# Patient Record
Sex: Female | Born: 1970 | Race: Black or African American | Hispanic: No | Marital: Single | Smoking: Never smoker
Health system: Southern US, Community
[De-identification: ages and names within clinical notes are randomized; demographics above are authoritative.]

## PROBLEM LIST (undated history)

## (undated) DIAGNOSIS — K219 Gastro-esophageal reflux disease without esophagitis: Secondary | ICD-10-CM

## (undated) DIAGNOSIS — J302 Other seasonal allergic rhinitis: Secondary | ICD-10-CM

## (undated) DIAGNOSIS — D649 Anemia, unspecified: Secondary | ICD-10-CM

## (undated) DIAGNOSIS — T7840XA Allergy, unspecified, initial encounter: Secondary | ICD-10-CM

## (undated) DIAGNOSIS — J45909 Unspecified asthma, uncomplicated: Secondary | ICD-10-CM

## (undated) DIAGNOSIS — Z5189 Encounter for other specified aftercare: Secondary | ICD-10-CM

## (undated) HISTORY — PX: WISDOM TOOTH EXTRACTION: SHX21

## (undated) HISTORY — DX: Unspecified asthma, uncomplicated: J45.909

## (undated) HISTORY — DX: Allergy, unspecified, initial encounter: T78.40XA

## (undated) HISTORY — DX: Gastro-esophageal reflux disease without esophagitis: K21.9

## (undated) HISTORY — DX: Other seasonal allergic rhinitis: J30.2

## (undated) HISTORY — DX: Anemia, unspecified: D64.9

## (undated) HISTORY — DX: Encounter for other specified aftercare: Z51.89

---

## 2004-07-30 ENCOUNTER — Ambulatory Visit (HOSPITAL_COMMUNITY): Admission: RE | Admit: 2004-07-30 | Discharge: 2004-07-30 | Payer: Self-pay | Admitting: *Deleted

## 2004-07-30 ENCOUNTER — Ambulatory Visit (HOSPITAL_BASED_OUTPATIENT_CLINIC_OR_DEPARTMENT_OTHER): Admission: RE | Admit: 2004-07-30 | Discharge: 2004-07-30 | Payer: Self-pay | Admitting: *Deleted

## 2008-10-27 ENCOUNTER — Ambulatory Visit: Payer: Self-pay

## 2008-10-28 ENCOUNTER — Ambulatory Visit: Payer: Self-pay

## 2008-11-14 HISTORY — PX: NASAL POLYP SURGERY: SHX186

## 2010-01-29 ENCOUNTER — Emergency Department: Payer: Self-pay | Admitting: Emergency Medicine

## 2013-11-14 HISTORY — PX: GASTRIC BYPASS: SHX52

## 2013-12-24 ENCOUNTER — Ambulatory Visit: Payer: Self-pay | Admitting: Bariatrics

## 2013-12-24 LAB — COMPREHENSIVE METABOLIC PANEL
Albumin: 3.4 g/dL (ref 3.4–5.0)
Alkaline Phosphatase: 76 U/L
Anion Gap: 8 (ref 7–16)
BILIRUBIN TOTAL: 0.5 mg/dL (ref 0.2–1.0)
BUN: 8 mg/dL (ref 7–18)
CHLORIDE: 103 mmol/L (ref 98–107)
CREATININE: 1.06 mg/dL (ref 0.60–1.30)
Calcium, Total: 9 mg/dL (ref 8.5–10.1)
Co2: 26 mmol/L (ref 21–32)
EGFR (African American): >60
EGFR (Non-African Amer.): >60
Glucose: 116 mg/dL — ABNORMAL HIGH (ref 65–99)
OSMOLALITY: 273 (ref 275–301)
Potassium: 3.8 mmol/L (ref 3.5–5.1)
SGOT(AST): 17 U/L (ref 15–37)
SGPT (ALT): 25 U/L (ref 12–78)
SODIUM: 137 mmol/L (ref 136–145)
TOTAL PROTEIN: 7.4 g/dL (ref 6.4–8.2)

## 2013-12-24 LAB — CBC WITH DIFFERENTIAL/PLATELET
Basophil #: 0.1 10*3/uL (ref 0.0–0.1)
Basophil %: 0.9 %
Eosinophil #: 0.3 10*3/uL (ref 0.0–0.7)
Eosinophil %: 4.6 %
HCT: 28.8 % — AB (ref 35.0–47.0)
HGB: 8.6 g/dL — AB (ref 12.0–16.0)
LYMPHS ABS: 1.4 10*3/uL (ref 1.0–3.6)
LYMPHS PCT: 25.6 %
MCH: 18.5 pg — AB (ref 26.0–34.0)
MCHC: 30 g/dL — ABNORMAL LOW (ref 32.0–36.0)
MCV: 62 fL — ABNORMAL LOW (ref 80–100)
MONO ABS: 0.5 x10 3/mm (ref 0.2–0.9)
Monocyte %: 8.1 %
NEUTROS ABS: 3.4 10*3/uL (ref 1.4–6.5)
Neutrophil %: 60.8 %
Platelet: 210 10*3/uL (ref 150–440)
RBC: 4.67 10*6/uL (ref 3.80–5.20)
RDW: 18.3 % — ABNORMAL HIGH (ref 11.5–14.5)
WBC: 5.6 10*3/uL (ref 3.6–11.0)

## 2013-12-24 LAB — IRON AND TIBC
IRON SATURATION: 5 %
Iron Bind.Cap.(Total): 500 ug/dL — ABNORMAL HIGH (ref 250–450)
Iron: 27 ug/dL — ABNORMAL LOW (ref 50–170)
Unbound Iron-Bind.Cap.: 473 ug/dL

## 2013-12-24 LAB — AMYLASE: AMYLASE: 49 U/L (ref 25–115)

## 2013-12-24 LAB — FERRITIN: FERRITIN (ARMC): 4 ng/mL — AB (ref 8–388)

## 2013-12-24 LAB — FOLATE: Folic Acid: 17.8 ng/mL (ref 3.1–100.0)

## 2013-12-24 LAB — LIPASE, BLOOD: LIPASE: 92 U/L (ref 73–393)

## 2013-12-24 LAB — APTT: Activated PTT: 27.7 secs (ref 23.6–35.9)

## 2013-12-24 LAB — TSH: THYROID STIMULATING HORM: 1.39 u[IU]/mL

## 2013-12-24 LAB — MAGNESIUM: MAGNESIUM: 1.9 mg/dL

## 2013-12-24 LAB — HEMOGLOBIN A1C: HEMOGLOBIN A1C: 6 % (ref 4.2–6.3)

## 2013-12-24 LAB — PROTIME-INR
INR: 1
Prothrombin Time: 13.5 secs (ref 11.5–14.7)

## 2013-12-24 LAB — PHOSPHORUS: Phosphorus: 3.5 mg/dL (ref 2.5–4.9)

## 2013-12-24 LAB — BILIRUBIN, DIRECT: Bilirubin, Direct: 0.1 mg/dL (ref 0.00–0.20)

## 2014-01-27 ENCOUNTER — Ambulatory Visit: Payer: Self-pay | Admitting: Bariatrics

## 2014-02-12 ENCOUNTER — Ambulatory Visit: Payer: Self-pay | Admitting: Bariatrics

## 2014-02-12 IMAGING — CR DG CHEST 2V
1 series · 2 of 2 positions shown · non-contrast
Comparison: None.

CLINICAL DATA: History of asthma, pre bariatric surgery evaluation

EXAM:
CHEST  2 VIEW

[Series 1: w chest pa · 0.14mm/px · 2 of 2 slices shown]
[im 1/2]
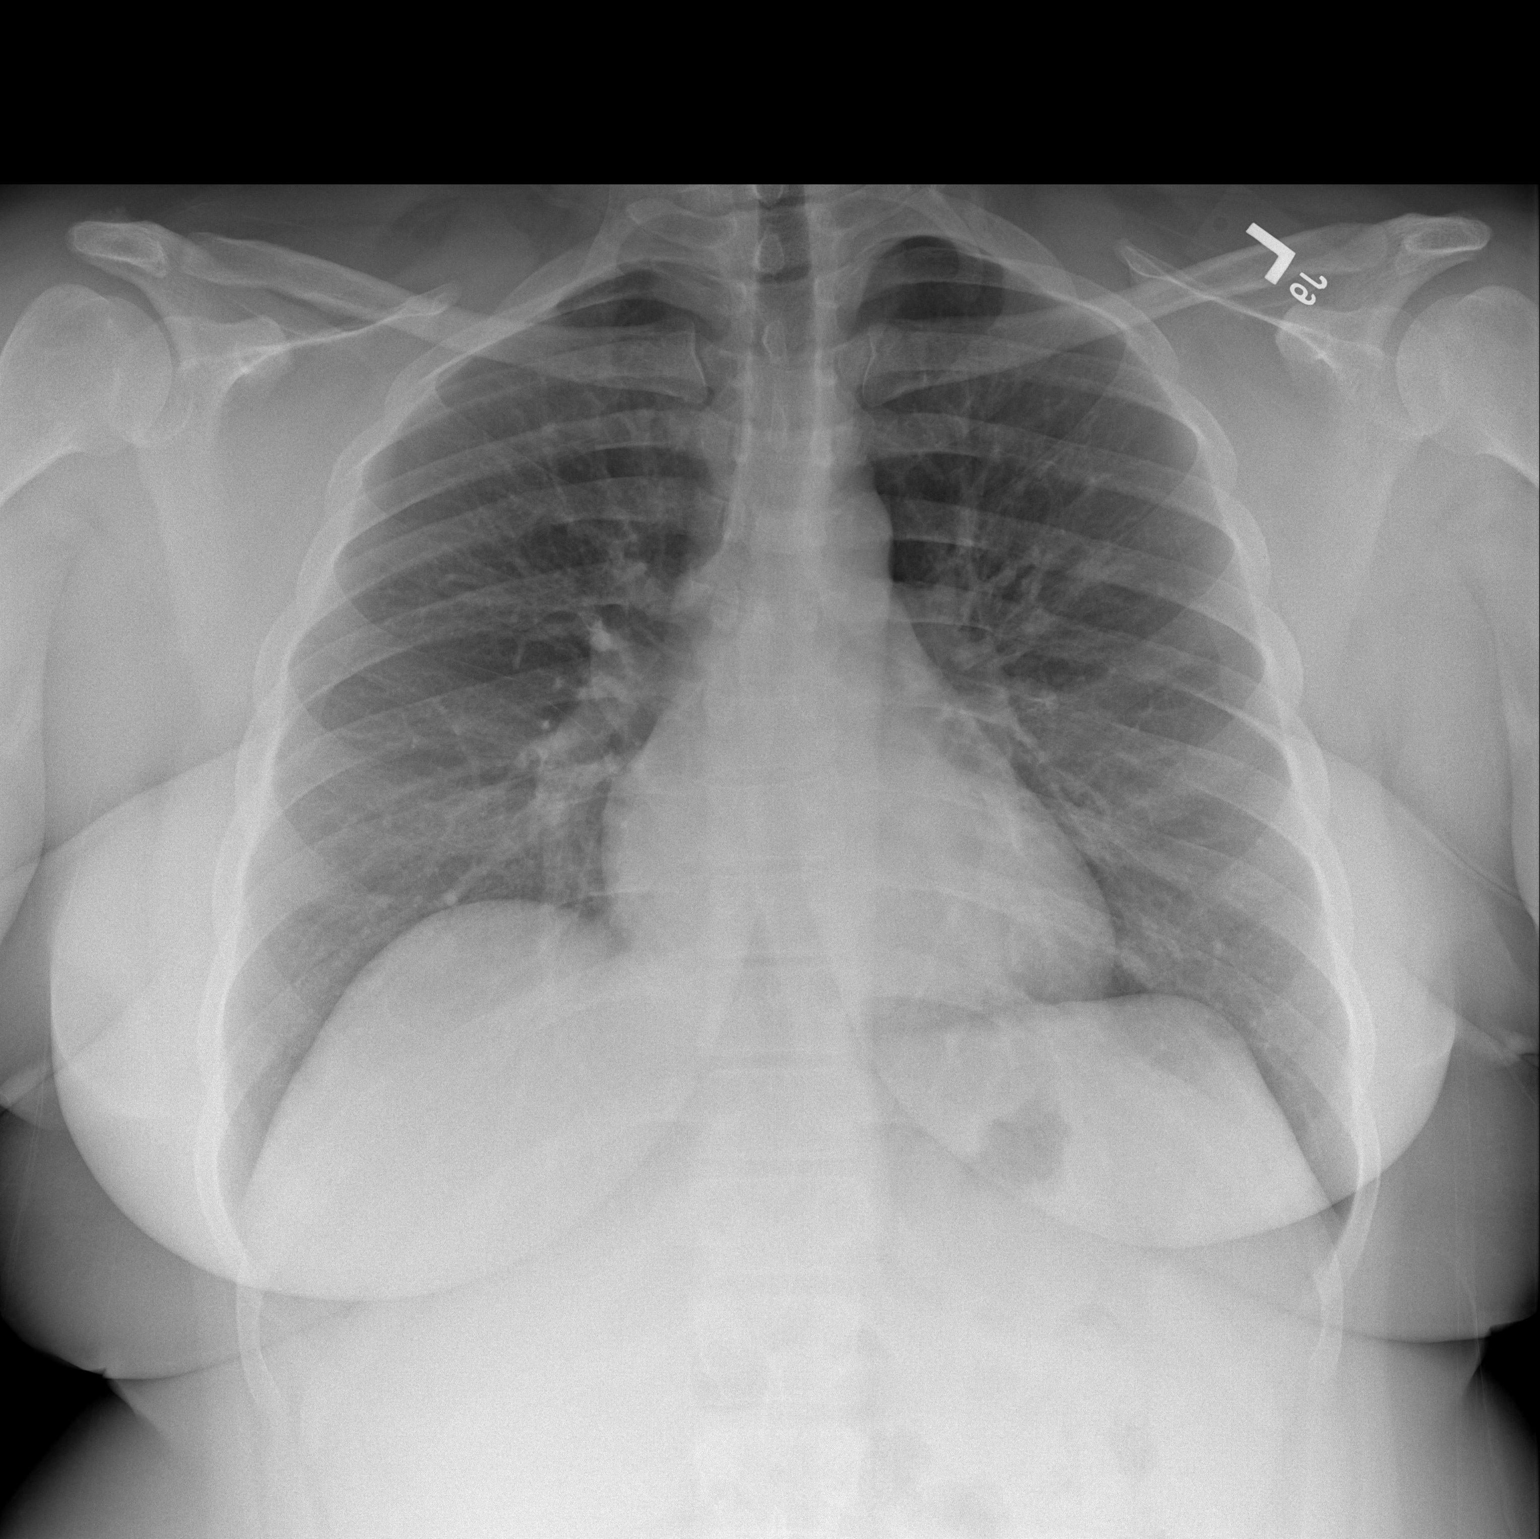
[im 2/2]
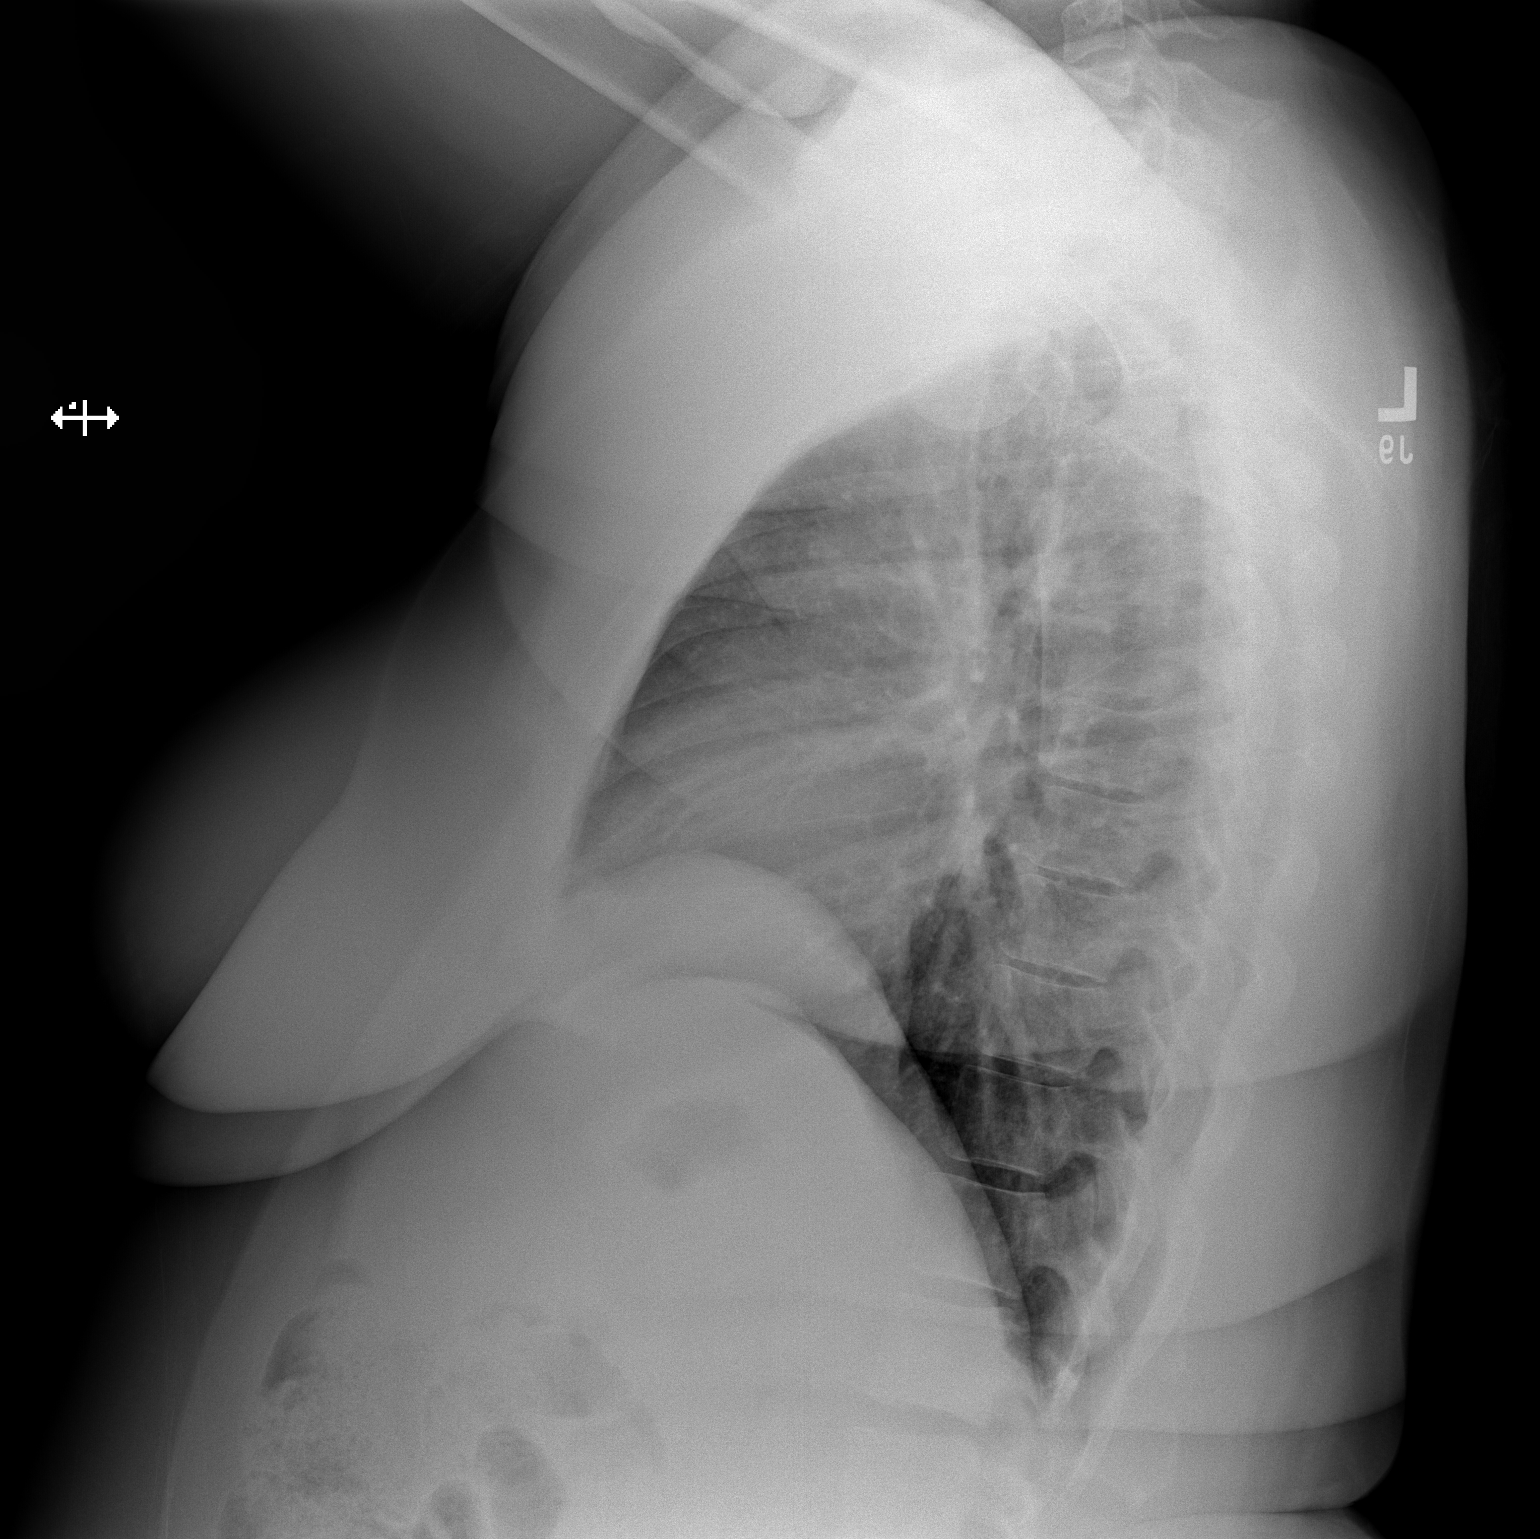

[2 of 2 positions shown; findings below may reference images not displayed]

FINDINGS: The heart size and mediastinal contours are within normal limits.
Both lungs are clear. The visualized skeletal structures are
unremarkable.
IMPRESSION: No active cardiopulmonary disease.

## 2017-01-24 ENCOUNTER — Telehealth: Payer: Self-pay

## 2017-01-24 DIAGNOSIS — F5101 Primary insomnia: Secondary | ICD-10-CM

## 2017-01-24 MED ORDER — TRAZODONE HCL 50 MG PO TABS: 100.0000 mg | ORAL_TABLET | Freq: Every evening | ORAL | 2 refills | Status: DC | PRN

## 2017-01-24 NOTE — Telephone Encounter (Signed)
Pt states she received a denial for her trazodone 50mg  from her her pharmacy and requests SDJ to send in. I have updated her pharmcy to [facility]. Pt cb# [phone] thank you

## 2017-01-24 NOTE — Telephone Encounter (Signed)
Please advise and let Meriam SpragueBeverly know. thanks

## 2017-01-25 NOTE — Telephone Encounter (Signed)
rx for trazodone sent to [facility] on 01/24/17 by SDJ.

## 2018-04-23 ENCOUNTER — Ambulatory Visit: Payer: Self-pay | Admitting: Obstetrics and Gynecology

## 2018-05-03 ENCOUNTER — Encounter: Payer: Self-pay | Admitting: Obstetrics and Gynecology

## 2018-05-03 ENCOUNTER — Ambulatory Visit (INDEPENDENT_AMBULATORY_CARE_PROVIDER_SITE_OTHER): Payer: 59 | Admitting: Obstetrics and Gynecology

## 2018-05-03 VITALS — BP 124/74 | HR 88 | Ht 66.0 in | Wt 255.0 lb

## 2018-05-03 DIAGNOSIS — Z1339 Encounter for screening examination for other mental health and behavioral disorders: Secondary | ICD-10-CM | POA: Diagnosis not present

## 2018-05-03 DIAGNOSIS — Z113 Encounter for screening for infections with a predominantly sexual mode of transmission: Secondary | ICD-10-CM | POA: Diagnosis not present

## 2018-05-03 DIAGNOSIS — Z01419 Encounter for gynecological examination (general) (routine) without abnormal findings: Secondary | ICD-10-CM | POA: Diagnosis not present

## 2018-05-03 DIAGNOSIS — Z0000 Encounter for general adult medical examination without abnormal findings: Secondary | ICD-10-CM | POA: Diagnosis not present

## 2018-05-03 DIAGNOSIS — Z1331 Encounter for screening for depression: Secondary | ICD-10-CM | POA: Diagnosis not present

## 2018-05-03 NOTE — Progress Notes (Signed)
Gynecology Annual Exam   PCP: Patient, No Pcp Per  Chief Complaint  Patient presents with  . Gynecologic Exam    History of Present Illness:  Ms. Frances Rodgers is a 47 y.o. No obstetric history on file. who LMP was Patient's last menstrual period was 04/16/2018 (exact date)., presents today for her annual examination.  Her menses are regular every 28-30 days, lasting 5 day(s).  Dysmenorrhea none. She does not have intermenstrual bleeding.  She does not have vasomotor symptoms.   She is single partner, contraception - IUD. She does not have vaginal dryness.  Last Pap: 11/2016  Results were: no abnormalities /neg HPV DNA.  Hx of STDs: none  Last mammogram: 09/2015 Results were: normal--routine follow-up in 12 months There is no FH of breast cancer. There is no FH of ovarian cancer. The patient does not do self-breast exams.  Colonoscopy: n/a DEXA: has not been screened for osteoporosis  Tobacco use: The patient denies current or previous tobacco use. Alcohol use: social drinker Exercise: very active (daily for past two months)  The patient wears seatbelts: yes.     The patient requests STD screening, blood tests included. She also requests a serum pregnancy test.   Past Medical History:  Diagnosis Date  . Asthma   . GERD (gastroesophageal reflux disease)   . Seasonal allergies     Past Surgical History:  Procedure Laterality Date  . GASTRIC BYPASS  2015  . NASAL POLYP SURGERY  2010  . WISDOM TOOTH EXTRACTION      Medications   Medication Sig Start Date End Date Taking? Authorizing Provider  albuterol (PROVENTIL HFA) 108 (90 Base) MCG/ACT inhaler Inhale into the lungs every 6 (six) hours as needed for wheezing or shortness of breath.   Yes [provider]  budesonide-formoterol (SYMBICORT) 80-4.5 MCG/ACT inhaler Inhale into the lungs.   Yes [provider]  Cetirizine HCl (ZYRTEC ALLERGY) 10 MG CAPS Take by mouth. 06/02/03  Yes [provider]  fluticasone (FLONASE) 50 MCG/ACT nasal spray 2 sprays by Each Nare route daily. 06/02/03  Yes [provider]  levonorgestrel (MIRENA) 20 MCG/24HR IUD 1 each by Intrauterine route once.   Yes [provider]  montelukast (SINGULAIR) 10 MG tablet Take by mouth.   Yes [provider]  omega-3 acid ethyl esters (LOVAZA) 1 g capsule Take by mouth 2 (two) times daily.   Yes [provider]  Omega-3 Fatty Acids (FISH OIL PO) Take by mouth.   Yes [provider]  omeprazole (PRILOSEC) 10 MG capsule Take 10 mg by mouth daily.   Yes [provider]  traZODone (DESYREL) 50 MG tablet Take 2 tablets (100 mg total) by mouth at bedtime as needed for sleep. 01/24/17  Yes Conard NovakJackson, Saul Fabiano D, MD    Allergies  Allergen Reactions  . Aspirin Anaphylaxis  . Ketorolac Anaphylaxis     Obstetric History: No obstetric history on file.  Family History  Problem Relation Age of Onset  . Diabetes Mother   . Diabetes Brother   . Pancreatic cancer Maternal Grandmother     Social History   Socioeconomic History  . Marital status: Single    Spouse name: Not on file  . Number of children: Not on file  . Years of education: Not on file  . Highest education level: Not on file  Occupational History  . Not on file  Social Needs  . Financial resource strain: Not on file  . Food  insecurity:    Worry: Not on file    Inability: Not on file  . Transportation needs:    Medical: Not on file    Non-medical: Not on file  Tobacco Use  . Smoking status: Never Smoker  . Smokeless tobacco: Current User  Substance and Sexual Activity  . Alcohol use: Never    Frequency: Never  . Drug use: Never  . Sexual activity: Yes    Birth control/protection: Condom  Lifestyle  . Physical activity:    Days per week: Not on file    Minutes per session: Not on file  . Stress: Not on file  Relationships  . Social connections:    Talks on phone: Not on file      Gets together: Not on file    Attends religious service: Not on file    Active member of club or organization: Not on file    Attends meetings of clubs or organizations: Not on file    Relationship status: Not on file  . Intimate partner violence:    Fear of current or ex partner: Not on file    Emotionally abused: Not on file    Physically abused: Not on file    Forced sexual activity: Not on file  Other Topics Concern  . Not on file  Social History Narrative  . Not on file    Review of Systems  Constitutional: Negative.   HENT: Negative.   Eyes: Negative.   Respiratory: Negative.   Cardiovascular: Negative.   Gastrointestinal: Negative.   Genitourinary: Negative.   Musculoskeletal: Negative.   Skin: Negative.   Neurological: Negative.   Psychiatric/Behavioral: Negative.      Physical Exam BP 124/74 (BP Location: Left Arm, Patient Position: Sitting, Cuff Size: Large)   Pulse 88   Ht 5\' 6"  (1.676 m)   Wt 255 lb (115.7 kg)   LMP 04/16/2018 (Exact Date)   SpO2 97%   BMI 41.16 kg/m   Physical Exam  Constitutional: She is oriented to person, place, and time. She appears well-developed and well-nourished. No distress.  Genitourinary: Uterus normal. Pelvic exam was performed with patient supine. There is no rash, tenderness, lesion or injury on the right labia. There is no rash, tenderness, lesion or injury on the left labia. No erythema, tenderness or bleeding in the vagina. No signs of injury around the vagina. No vaginal discharge found. Right adnexum does not display mass, does not display tenderness and does not display fullness. Left adnexum does not display mass, does not display tenderness and does not display fullness.  Cervix exhibits visible IUD strings. Cervix does not exhibit motion tenderness, lesion, discharge or polyp.   Uterus is mobile and anteverted. Uterus is not enlarged, tender or exhibiting a mass.  HENT:  Head: Normocephalic and atraumatic.  Eyes:  EOM are normal. No scleral icterus.  Neck: Normal range of motion. Neck supple. No thyromegaly present.  Cardiovascular: Normal rate and regular rhythm. Exam reveals no gallop and no friction rub.  No murmur heard. Pulmonary/Chest: Effort normal and breath sounds normal. No respiratory distress. She has no wheezes. She has no rales. Right breast exhibits no inverted nipple, no mass, no nipple discharge, no skin change and no tenderness. Left breast exhibits no inverted nipple, no mass, no nipple discharge, no skin change and no tenderness.  Abdominal: Soft. Bowel sounds are normal. She exhibits no distension and no mass. There is no tenderness. There is no rebound and no guarding.  Musculoskeletal: Normal range  of motion. She exhibits no edema or tenderness.  Lymphadenopathy:    She has no cervical adenopathy.       Right: No inguinal adenopathy present.       Left: No inguinal adenopathy present.  Neurological: She is alert and oriented to person, place, and time. No cranial nerve deficit.  Skin: Skin is warm and dry. No rash noted. No erythema.  Psychiatric: She has a normal mood and affect. Her behavior is normal. Judgment normal.    Female chaperone present for pelvic and breast  portions of the physical exam  Results: AUDIT Questionnaire (screen for alcoholism): 1 PHQ-9: 3  Assessment: 47 y.o. No obstetric history on file. female here for routine gynecologic examination.  Plan: Problem List Items Addressed This Visit    None    Visit Diagnoses    Women's annual routine gynecological examination    -  Primary   Screening for depression       Screening for alcoholism       Screen for STD (sexually transmitted disease)       Relevant Orders   RPR Qual   HIV antibody   Hepatitis B surface antibody   Hepatitis C antibody   Chlamydia/Gonococcus/Trichomonas, NAA   Beta hCG quant (ref lab)      Screening: -- Blood pressure screen normal -- Colonoscopy - not due -- Mammogram  - due. Patient to call [facility] to arrange. She understands that it is her responsibility to arrange this. -- Weight screening: obese: discussed management options, including lifestyle, dietary, and exercise. -- Depression screening negative (PHQ-9) -- Nutrition: normal -- cholesterol screening: not due for screening -- osteoporosis screening: not due -- tobacco screening: not using -- alcohol screening: AUDIT questionnaire indicates low-risk usage. -- family history of breast cancer screening: done. not at high risk. -- no evidence of domestic violence or intimate partner violence. -- STD screening: gonorrhea/chlamydia NAAT collected -- pap smear not collected per ASCCP guidelines -- HPV vaccination series: not eligilbe  Thomasene Mohair, MD 05/03/2018 11:38 AM

## 2018-05-04 LAB — RPR QUALITATIVE: RPR Ser Ql: NONREACTIVE

## 2018-05-04 LAB — BETA HCG QUANT (REF LAB): hCG Quant: <1 m[IU]/mL

## 2018-05-04 LAB — HEPATITIS C ANTIBODY

## 2018-05-04 LAB — HIV ANTIBODY (ROUTINE TESTING W REFLEX): HIV SCREEN 4TH GENERATION: NONREACTIVE

## 2018-05-04 LAB — HEPATITIS B SURFACE ANTIBODY,QUALITATIVE: HEP B SURFACE AB, QUAL: NONREACTIVE

## 2018-05-06 LAB — CHLAMYDIA/GONOCOCCUS/TRICHOMONAS, NAA
Chlamydia by NAA: NEGATIVE
GONOCOCCUS BY NAA: NEGATIVE
TRICH VAG BY NAA: NEGATIVE

## 2018-05-14 ENCOUNTER — Other Ambulatory Visit: Payer: Self-pay | Admitting: Physician Assistant

## 2018-05-14 DIAGNOSIS — R1312 Dysphagia, oropharyngeal phase: Secondary | ICD-10-CM

## 2018-05-18 ENCOUNTER — Ambulatory Visit
Admission: RE | Admit: 2018-05-18 | Discharge: 2018-05-18 | Disposition: A | Discharge status: Home / Self Care | Payer: 59 | Source: Ambulatory Visit | Attending: Physician Assistant | Admitting: Physician Assistant

## 2018-05-18 DIAGNOSIS — J029 Acute pharyngitis, unspecified: Secondary | ICD-10-CM | POA: Insufficient documentation

## 2018-05-18 DIAGNOSIS — R0982 Postnasal drip: Secondary | ICD-10-CM | POA: Insufficient documentation

## 2018-05-18 DIAGNOSIS — R1310 Dysphagia, unspecified: Secondary | ICD-10-CM | POA: Diagnosis not present

## 2018-05-18 DIAGNOSIS — J322 Chronic ethmoidal sinusitis: Secondary | ICD-10-CM | POA: Insufficient documentation

## 2018-05-18 DIAGNOSIS — J320 Chronic maxillary sinusitis: Secondary | ICD-10-CM | POA: Insufficient documentation

## 2018-05-18 DIAGNOSIS — R1312 Dysphagia, oropharyngeal phase: Secondary | ICD-10-CM

## 2018-05-18 DIAGNOSIS — R0602 Shortness of breath: Secondary | ICD-10-CM | POA: Diagnosis present

## 2018-05-18 MED ORDER — IOPAMIDOL (ISOVUE-300) INJECTION 61%
75.0000 mL | Freq: Once | INTRAVENOUS | Status: AC | PRN
  Administered 2018-05-18: 75 mL via INTRAVENOUS

## 2018-07-07 IMAGING — CT CT NECK W/ CM
4 of 6 series · 13 of 33 positions shown, 15 images · IV contrast (iopamidol)
Comparison: None.

CLINICAL DATA: Dysphagia, oropharyngeal. Intermittent neck pain for
3 months.

EXAM:
CT NECK WITH CONTRAST
TECHNIQUE: Multidetector CT imaging of the neck was performed using the
standard protocol following the bolus administration of intravenous
contrast.
CONTRAST:  75mL [id number] IOPAMIDOL ([id number]) INJECTION 61%

[Series 2: axial neck neck (person_name) · axial · 0.57mm/px · z∈[-772,-692]mm · 2 of 122 slices shown]
[im 41/122  bone]
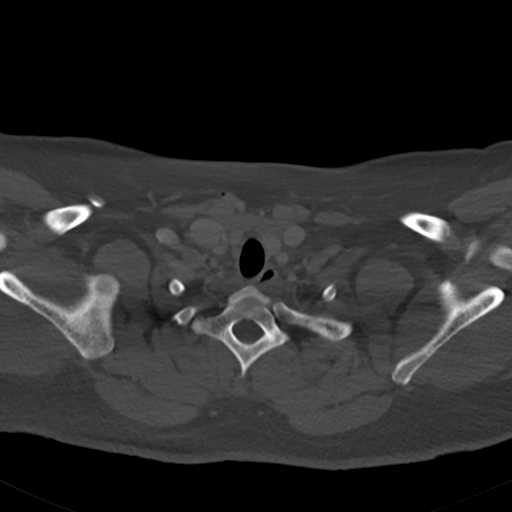
[im 81/122  bone]
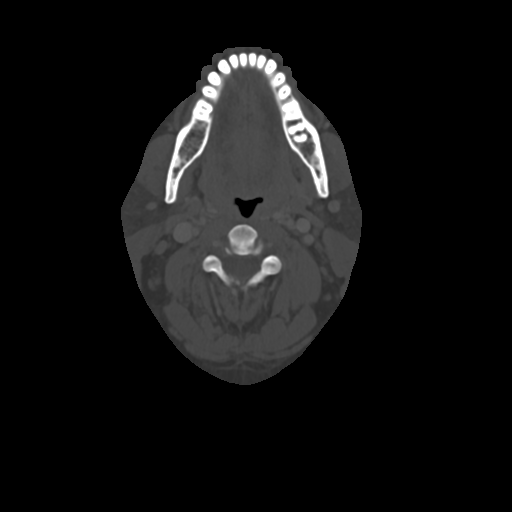

[Series 5: coronal neck neck (person_name) · coronal · 0.57mm/px · 3 of 120 slices shown]
[im 27/120  bone]
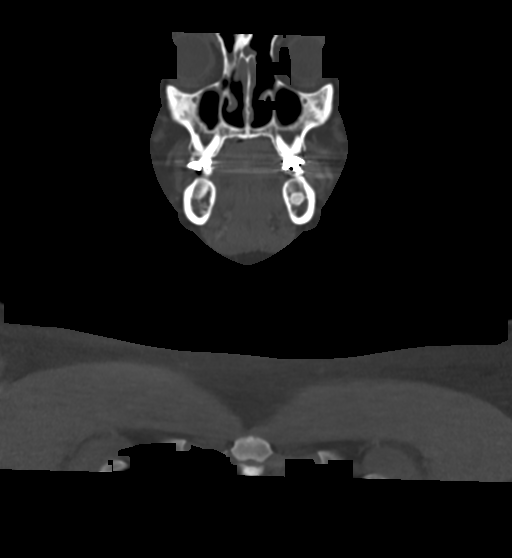
[im 49/120  bone]
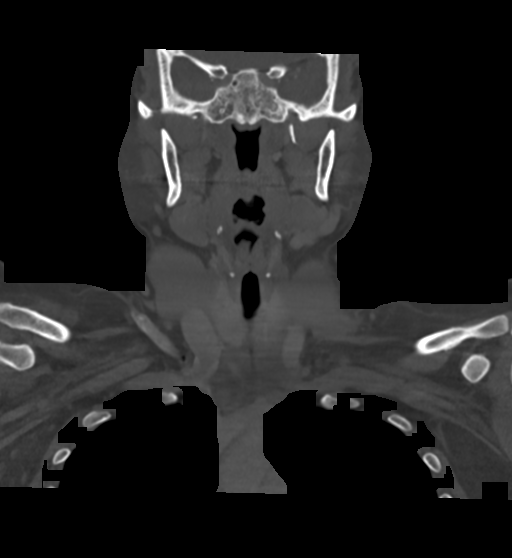
[im 71/120  bone]
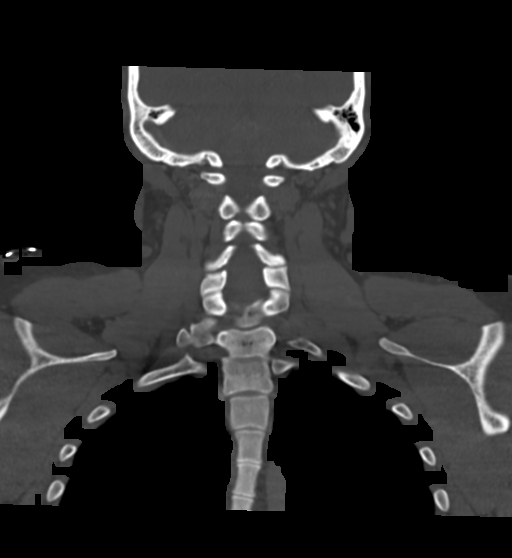

[Series 7: sagittal neck neck (person_name) · sagittal · 0.47mm/px · 5 of 146 slices shown, 6 images]
[im 49/146  bone]
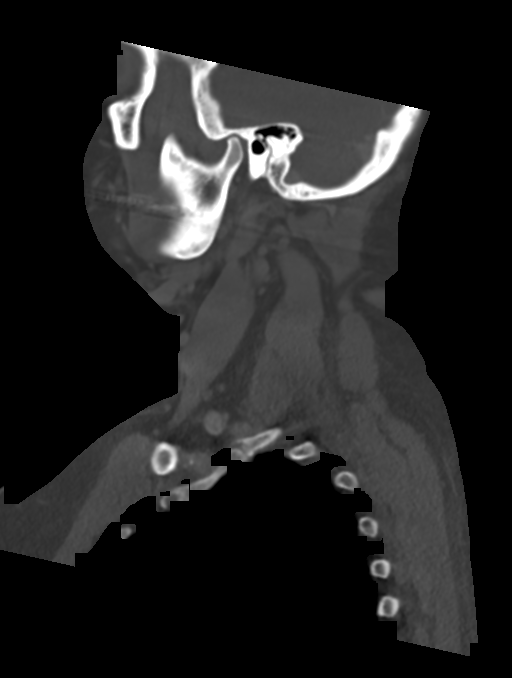
[im 61/146  bone]
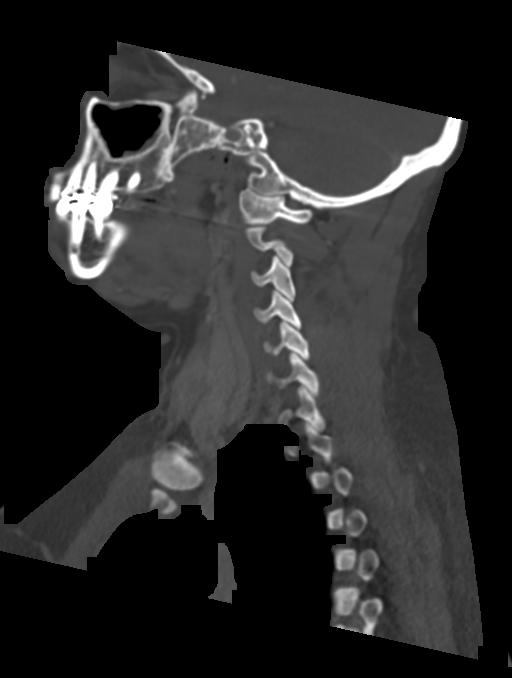
[im 73/146  soft-tissue]
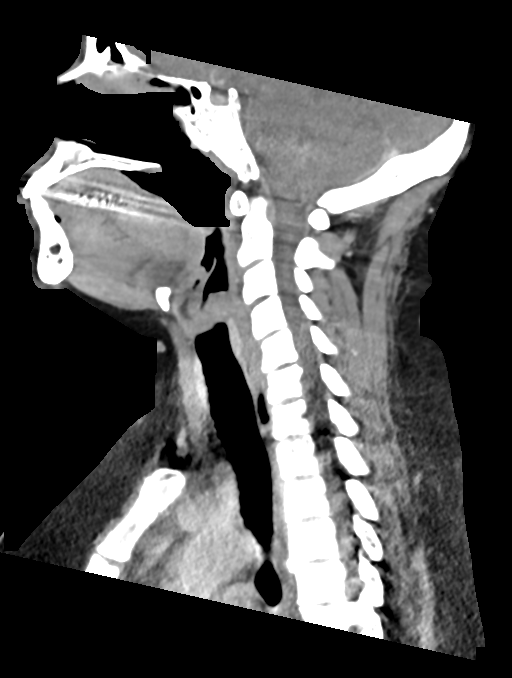
[im 73/146  bone]
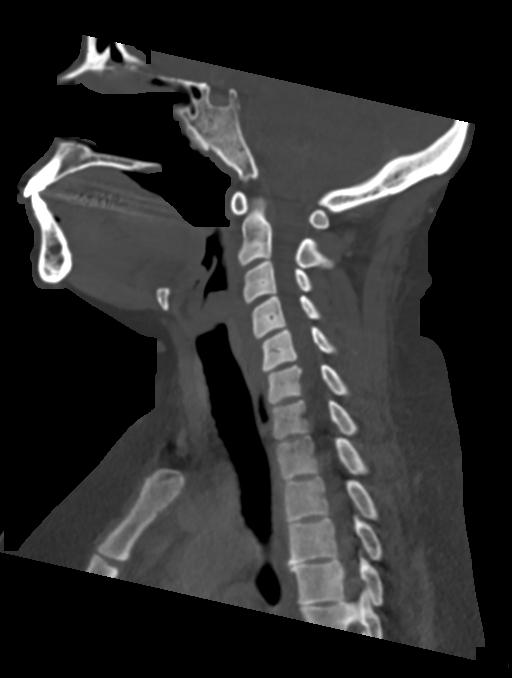
[im 85/146  bone]
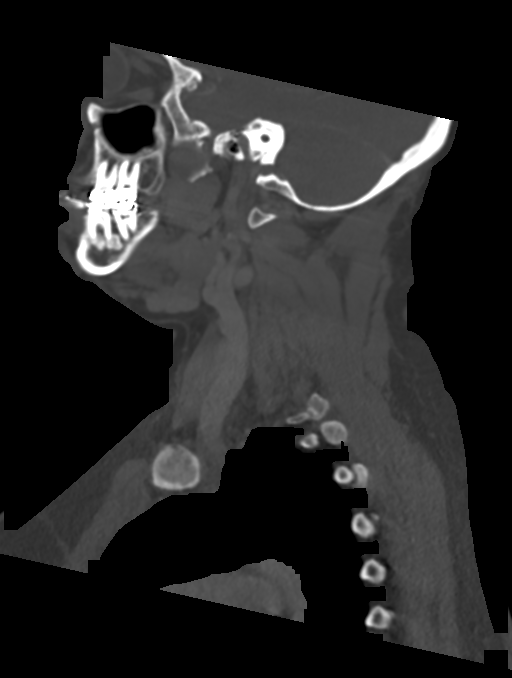
[im 97/146  bone]
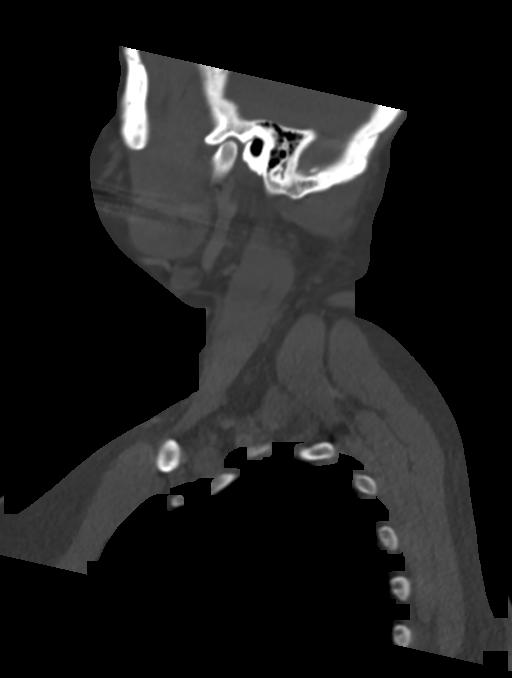

[Series 9: ax oropharynx neck neck (person_name) · axial · 0.47mm/px · z∈[-837,-685]mm · 3 of 157 slices shown, 4 images]
[im 40/157  soft-tissue]
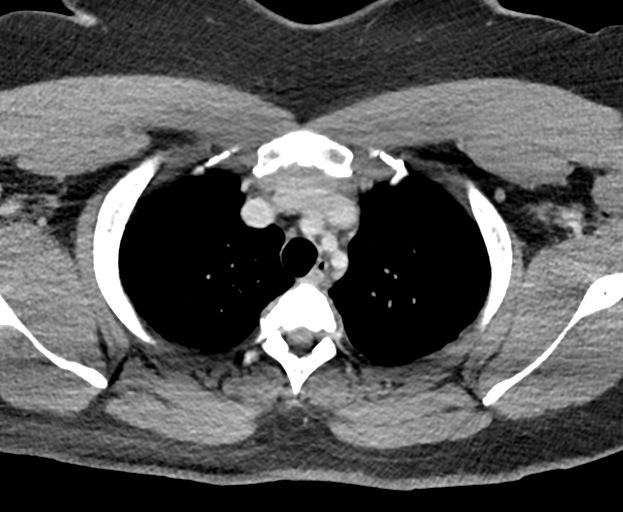
[im 40/157  bone]
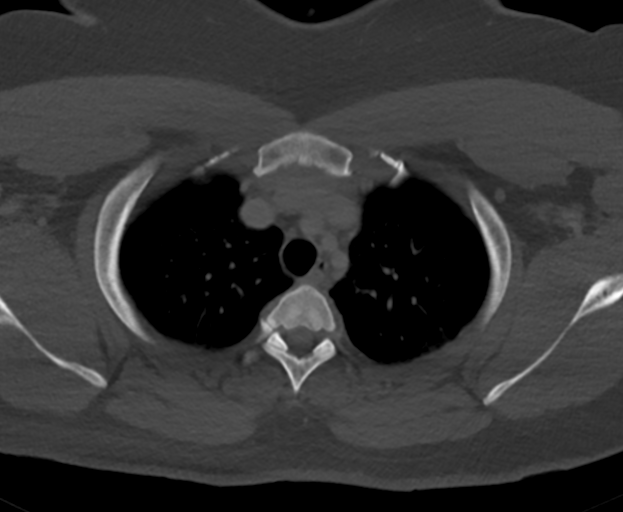
[im 79/157  bone]
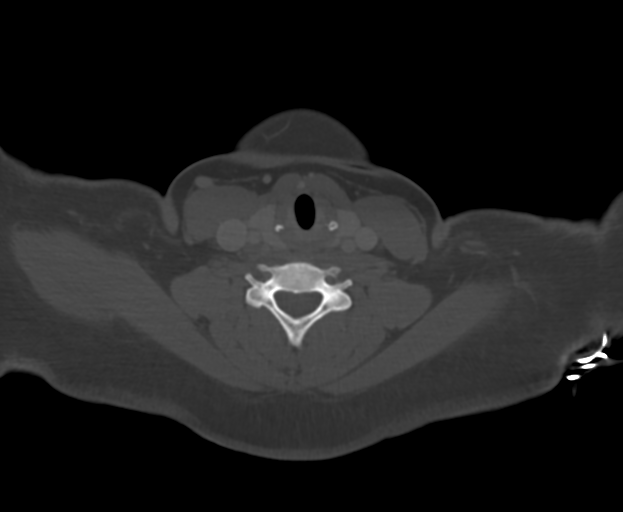
[im 118/157  bone]
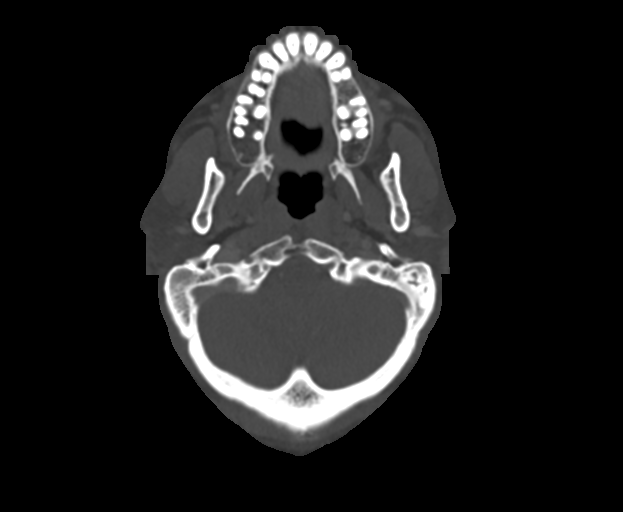

[13 of 33 positions shown; findings below may reference images not displayed]

FINDINGS: Pharynx and larynx: Negative.  No evidence of mass or inflammation

Salivary glands: Accounting for small lymph nodes in the parotids
the salivary glands are unremarkable.

Thyroid: 4 mm nodule on the right, below size threshold for
sonographic follow-up per consensus guidelines

Lymph nodes: Negative

Vascular: Negative

Limited intracranial: Negative

Visualized orbits: Negative

Mastoids and visualized paranasal sinuses: Status post endoscopic
sinus surgery with ethmoidectomies and medial maxillary
antrostomies. There has also been partial turbinectomies. There is
residual mild generalized mucosal thickening in ethmoid and
maxillary sinuses. No fluid levels.

Skeleton: Accessory cervical ribs.  No acute or aggressive finding

Upper chest: Negative
IMPRESSION: 1. No explanation for dysphagia.
2. Mild maxillary and ethmoid sinusitis. Maxillary antrostomies are
widely patent

## 2019-04-02 ENCOUNTER — Telehealth: Payer: Self-pay

## 2019-04-02 NOTE — Telephone Encounter (Signed)
Pt is scheduled for AE w/SDJ 05/09/2019. She is inquiring if her IUD is due to be replaced & also. She is self pay & is inquiring what her total cost for the visit will be. She is unemployed & uninsured currently. Cb#[phone]

## 2019-04-02 NOTE — Telephone Encounter (Signed)
Reviewed [facility] chart. Mirena was placed 09/03/14. Will be due for replacement 09/04/2019.

## 2019-04-09 NOTE — Telephone Encounter (Signed)
I left generic voicemail for patient to call back to go over pricing for Self payment please advise patient when she returns call.  137.25 for office visit. Pap smear will be billed and [facility] has disconnect labs that would have to be paid at the time of visit

## 2019-05-09 ENCOUNTER — Ambulatory Visit (INDEPENDENT_AMBULATORY_CARE_PROVIDER_SITE_OTHER): Payer: Medicaid Other | Admitting: Obstetrics and Gynecology

## 2019-05-09 ENCOUNTER — Encounter: Payer: Self-pay | Admitting: Obstetrics and Gynecology

## 2019-05-09 ENCOUNTER — Other Ambulatory Visit (HOSPITAL_COMMUNITY)
Admission: RE | Admit: 2019-05-09 | Discharge: 2019-05-09 | Disposition: A | Discharge status: Home / Self Care | Payer: Medicaid Other | Source: Ambulatory Visit | Attending: Obstetrics and Gynecology | Admitting: Obstetrics and Gynecology

## 2019-05-09 ENCOUNTER — Other Ambulatory Visit: Payer: Self-pay

## 2019-05-09 VITALS — BP 128/88 | Ht 66.0 in | Wt 262.6 lb

## 2019-05-09 DIAGNOSIS — Z124 Encounter for screening for malignant neoplasm of cervix: Secondary | ICD-10-CM | POA: Insufficient documentation

## 2019-05-09 DIAGNOSIS — Z0000 Encounter for general adult medical examination without abnormal findings: Secondary | ICD-10-CM | POA: Diagnosis not present

## 2019-05-09 DIAGNOSIS — Z113 Encounter for screening for infections with a predominantly sexual mode of transmission: Secondary | ICD-10-CM | POA: Insufficient documentation

## 2019-05-09 DIAGNOSIS — Z1151 Encounter for screening for human papillomavirus (HPV): Secondary | ICD-10-CM | POA: Diagnosis not present

## 2019-05-09 DIAGNOSIS — Z1331 Encounter for screening for depression: Secondary | ICD-10-CM

## 2019-05-09 DIAGNOSIS — Z01419 Encounter for gynecological examination (general) (routine) without abnormal findings: Secondary | ICD-10-CM

## 2019-05-09 DIAGNOSIS — Z1339 Encounter for screening examination for other mental health and behavioral disorders: Secondary | ICD-10-CM

## 2019-05-09 NOTE — Progress Notes (Signed)
Gynecology Annual Exam  PCP: Patient, No Pcp Per  Chief Complaint:  Chief Complaint  Patient presents with  . Gynecologic Exam    History of Present Illness:  Ms. Reinaldo RaddleValerie L Sampsel is a 48 y.o. G1P1001 who LMP was Patient's last menstrual period was 04/25/2019 (exact date)., presents today for her annual examination.  Her menses are regular every 28-30 days, lasting 5 day(s).  Dysmenorrhea none. She does not have intermenstrual bleeding.  She does not have vasomotor sx.   She is not sexually active. She does have an IUD.She has had the IUD for 5 years. The IUD was placed in 08/2014.  She does not have vaginal dryness.  Last Pap: 11/2016  Results were: no abnormalities /neg HPV DNA.  Hx of STDs: none  Last mammogram: 09/2015  Results were: Birads 1 There is no FH of breast cancer. There is no FH of ovarian cancer. The patient does do self-breast exams.  Colonoscopy: n/a DEXA: has not been screened for osteoporosis  Tobacco use: The patient denies current or previous tobacco use. Alcohol use: social drinker Exercise: very active  The patient wears seatbelts: yes.     Past Medical History:  Diagnosis Date  . Asthma   . GERD (gastroesophageal reflux disease)   . Seasonal allergies     Past Surgical History:  Procedure Laterality Date  . GASTRIC BYPASS  2015  . NASAL POLYP SURGERY  2010  . WISDOM TOOTH EXTRACTION      Prior to Admission medications   Medication Sig Start Date End Date Taking? Authorizing Provider  albuterol (PROVENTIL HFA) 108 (90 Base) MCG/ACT inhaler Inhale into the lungs every 6 (six) hours as needed for wheezing or shortness of breath.   Yes [provider]  Cetirizine HCl (ZYRTEC ALLERGY) 10 MG CAPS Take by mouth. 06/02/03  Yes [provider]  fluticasone (FLONASE) 50 MCG/ACT nasal spray 2 sprays by Each Nare route daily. 06/02/03  Yes [provider]  levonorgestrel (MIRENA) 20 MCG/24HR IUD 1 each by Intrauterine route once.    Yes [provider]  montelukast (SINGULAIR) 10 MG tablet Take by mouth.   Yes [provider]  Omega-3 Fatty Acids (FISH OIL PO) Take by mouth.   Yes [provider]  pantoprazole (PROTONIX) 40 MG tablet ONE TABLE 30 MINUTES PRIOR TO FIRST MEAL OF DAY 04/12/19  Yes [provider]  QVAR REDIHALER 80 MCG/ACT inhaler INHALE 1 PUFF BY MOUTH TWICE A DAY 04/12/19  Yes [provider]  traZODone (DESYREL) 50 MG tablet Take 2 tablets (100 mg total) by mouth at bedtime as needed for sleep. 01/24/17  Yes Conard NovakJackson, Amariya Liskey D, MD    Allergies  Allergen Reactions  . Aspirin Anaphylaxis and Shortness Of Breath  . Ketorolac Anaphylaxis  . Ketorolac Tromethamine Shortness Of Breath    Obstetric History: G1P1001  Family History  Problem Relation Age of Onset  . Diabetes Mother   . Diabetes Brother   . Pancreatic cancer Maternal Grandmother     Social History   Socioeconomic History  . Marital status: Single    Spouse name: Not on file  . Number of children: Not on file  . Years of education: Not on file  . Highest education level: Not on file  Occupational History  . Not on file  Social Needs  . Financial resource strain: Not on file  . Food insecurity    Worry: Not on file    Inability: Not on file  .  Transportation needs    Medical: Not on file    Non-medical: Not on file  Tobacco Use  . Smoking status: Never Smoker  . Smokeless tobacco: Current User  Substance and Sexual Activity  . Alcohol use: Never    Frequency: Never  . Drug use: Never  . Sexual activity: Not Currently    Birth control/protection: I.U.D.    Comment: Mirena  Lifestyle  . Physical activity    Days per week: Not on file    Minutes per session: Not on file  . Stress: Not on file  Relationships  . Social Herbalist on phone: Not on file    Gets together: Not on file    Attends religious service: Not on file    Active member of club or organization:  Not on file    Attends meetings of clubs or organizations: Not on file    Relationship status: Not on file  . Intimate partner violence    Fear of current or ex partner: Not on file    Emotionally abused: Not on file    Physically abused: Not on file    Forced sexual activity: Not on file  Other Topics Concern  . Not on file  Social History Narrative  . Not on file    Review of Systems  Constitutional: Negative.   HENT: Negative.   Eyes: Negative.   Respiratory: Negative.   Cardiovascular: Negative.   Gastrointestinal: Negative.   Genitourinary: Negative.   Musculoskeletal: Negative.   Skin: Negative.   Neurological: Negative.   Psychiatric/Behavioral: Negative.      Physical Exam BP 128/88   Ht 5\' 6"  (1.676 m)   Wt 262 lb 9.6 oz (119.1 kg)   LMP 04/25/2019 (Exact Date)   BMI 42.38 kg/m   Physical Exam Constitutional:      General: She is not in acute distress.    Appearance: Normal appearance. She is well-developed.  Genitourinary:     Pelvic exam was performed with patient in the lithotomy position.     Vulva, urethra, bladder and uterus normal.     No inguinal adenopathy present in the right or left side.    No signs of injury in the vagina.     No vaginal discharge, erythema, tenderness or bleeding.     No cervical motion tenderness, discharge, lesion or polyp.     IUD strings visualized.     Uterus is mobile.     Uterus is not enlarged or tender.     No uterine mass detected.    Uterus is anteverted.     No right or left adnexal mass present.     Right adnexa not tender or full.     Left adnexa not tender or full.     Genitourinary Comments: Exam limited by body habitus  HENT:     Head: Normocephalic and atraumatic.  Eyes:     General: No scleral icterus.    Conjunctiva/sclera: Conjunctivae normal.  Neck:     Musculoskeletal: Normal range of motion and neck supple.     Thyroid: No thyromegaly.  Cardiovascular:     Rate and Rhythm: Normal rate and  regular rhythm.     Heart sounds: No murmur. No friction rub. No gallop.   Pulmonary:     Effort: Pulmonary effort is normal. No respiratory distress.     Breath sounds: Normal breath sounds. No wheezing or rales.  Chest:     Breasts:  Right: No inverted nipple, mass, nipple discharge, skin change or tenderness.        Left: No inverted nipple, mass, nipple discharge, skin change or tenderness.  Abdominal:     General: Bowel sounds are normal. There is no distension.     Palpations: Abdomen is soft. There is no mass.     Tenderness: There is no abdominal tenderness. There is no guarding or rebound.  Musculoskeletal: Normal range of motion.        General: No swelling or tenderness.  Lymphadenopathy:     Cervical: No cervical adenopathy.     Lower Body: No right inguinal adenopathy. No left inguinal adenopathy.  Neurological:     General: No focal deficit present.     Mental Status: She is alert and oriented to person, place, and time.     Cranial Nerves: No cranial nerve deficit.  Skin:    General: Skin is warm and dry.     Findings: No erythema or rash.  Psychiatric:        Mood and Affect: Mood normal.        Behavior: Behavior normal.        Judgment: Judgment normal.     Female chaperone present for pelvic and breast  portions of the physical exam  Results: AUDIT Questionnaire (screen for alcoholism): 1 PHQ-9: 5  Assessment: 48 y.o. 401P1001 female here for routine gynecologic examination.  Plan: Problem List Items Addressed This Visit    None    Visit Diagnoses    Women's annual routine gynecological examination    -  Primary   Relevant Orders   Cytology - PAP   Screening for depression       Screening for alcoholism       Pap smear for cervical cancer screening       Relevant Orders   Cytology - PAP   Screen for STD (sexually transmitted disease)       Relevant Orders   Cytology - PAP      Screening: -- Blood pressure screen normal --  Colonoscopy - not due -- Mammogram - due. Patient to call [facility] to arrange. She understands that it is her responsibility to arrange this. -- Weight screening: obese: discussed management options, including lifestyle, dietary, and exercise. -- Depression screening negative (PHQ-9) -- Nutrition: normal -- cholesterol screening: per PCP -- osteoporosis screening: not due -- tobacco screening: not using -- alcohol screening: AUDIT questionnaire indicates low-risk usage. -- family history of breast cancer screening: done. not at high risk. -- no evidence of domestic violence or intimate partner violence. -- STD screening: gonorrhea/chlamydia NAAT collected -- pap smear collected per ASCCP guidelines -- HPV vaccination series: not eligilbe   Patient to call to schedule IUD removal/replacement in 07/2019.  Will see at that time whether Mirena has been extended to 6 years.   Return in about 4 months (around 09/08/2019) for MIrena IUD removal/reinsertion.   Thomasene MohairStephen Lonnie Rosado, MD 05/09/2019 10:43 AM

## 2019-05-14 LAB — CYTOLOGY - PAP
Chlamydia: NEGATIVE
Diagnosis: NEGATIVE
HPV: NOT DETECTED
Neisseria Gonorrhea: NEGATIVE

## 2020-02-09 ENCOUNTER — Ambulatory Visit: Payer: Medicaid Other | Attending: Internal Medicine

## 2020-02-09 DIAGNOSIS — Z23 Encounter for immunization: Secondary | ICD-10-CM

## 2020-02-09 NOTE — Progress Notes (Signed)
   Covid-19 Vaccination Clinic  Name:  Frances Rodgers    MRN: [id number] DOB: 1971/03/02  02/09/2020  [name] was observed post Covid-19 immunization for 30 minutes based on pre-vaccination screening without incident. She was provided with Vaccine Information Sheet and instruction to access the V-Safe system.   [name] was instructed to call 911 with any severe reactions post vaccine: Marland Kitchen Difficulty breathing  . Swelling of face and throat  . A fast heartbeat  . A bad rash all over body  . Dizziness and weakness   Immunizations Administered    Name Date Dose VIS Date Route   Pfizer COVID-19 Vaccine 02/09/2020  8:25 AM 0.3 mL 10/25/2019 Intramuscular   Manufacturer: [employer], [employer]   Lot: [id number]   NDC: [address]-3

## 2020-02-09 NOTE — Progress Notes (Signed)
   Covid-19 Vaccination Clinic  Name:  SOLINA HERON    MRN: [id number] DOB: 1971/02/17  02/09/2020  Ms. Bloom was observed post Covid-19 immunization for 30 minutes based on pre-vaccination screening .  During the observation period, she experienced an adverse reaction with the following symptoms:  dizziness, and tingling down left arm.  Assessment : Time of assessment . Alert and oriented.complains of dizziness, and tingling down left arm.  Actions taken: Water given and drinking, VS at 0840 BP- 149/82, P- 82, O2- 100%     Medications administered: Diphenhydramine (Benadryl) 25 mg capsule, by mouth. Time: 0841. Administered by RN.  Disposition: Reports no further symptoms of adverse reaction after observation for 45 minutes. Discharged home.   Immunizations Administered    Name Date Dose VIS Date Route   Pfizer COVID-19 Vaccine 02/09/2020  8:25 AM 0.3 mL 10/25/2019 Intramuscular   Manufacturer: [employer], [employer]   Lot: [id number]   NDC: [address]-3

## 2020-02-11 ENCOUNTER — Telehealth: Payer: Self-pay | Admitting: *Deleted

## 2020-02-11 ENCOUNTER — Encounter: Payer: Self-pay | Admitting: *Deleted

## 2020-02-11 NOTE — Telephone Encounter (Signed)
Attempted to call Angi on her mobile number to discuss the COVID vaccine that she received on 02/09/2020. There was no answer, left a voicemail asking for patient to call back to discuss. Will attempt to call again.

## 2020-03-02 ENCOUNTER — Ambulatory Visit (INDEPENDENT_AMBULATORY_CARE_PROVIDER_SITE_OTHER): Payer: 59 | Admitting: Obstetrics and Gynecology

## 2020-03-02 ENCOUNTER — Other Ambulatory Visit: Payer: Self-pay

## 2020-03-02 ENCOUNTER — Encounter: Payer: Self-pay | Admitting: Obstetrics and Gynecology

## 2020-03-02 VITALS — BP 140/80 | Ht 66.0 in | Wt 242.0 lb

## 2020-03-02 DIAGNOSIS — Z1329 Encounter for screening for other suspected endocrine disorder: Secondary | ICD-10-CM

## 2020-03-02 DIAGNOSIS — E349 Endocrine disorder, unspecified: Secondary | ICD-10-CM

## 2020-03-02 DIAGNOSIS — N939 Abnormal uterine and vaginal bleeding, unspecified: Secondary | ICD-10-CM

## 2020-03-02 DIAGNOSIS — Z30432 Encounter for removal of intrauterine contraceptive device: Secondary | ICD-10-CM | POA: Diagnosis not present

## 2020-03-02 DIAGNOSIS — R635 Abnormal weight gain: Secondary | ICD-10-CM

## 2020-03-02 DIAGNOSIS — Z131 Encounter for screening for diabetes mellitus: Secondary | ICD-10-CM

## 2020-03-02 NOTE — Progress Notes (Signed)
   Chief Complaint  Patient presents with  . IUD removal    pt has been having vaginal bleeding since last October every other week     History of Present Illness:  Frances Rodgers is a 49 y.o. that had a Mirena IUD placed approximately 5 1/2 years ago. Since that time, she has done well but has been having 5 day periods QOwk since last Oct. Flow is moderate. Menses were monthly prior to IUD. Not currently sex active. Would like IUD removed. Also trying to lose wt and trainer wants hormone testing done for pt. No vasomotor sx. Last annual 6/20.  BP 140/80   Ht 5\' 6"  (1.676 m)   Wt 242 lb (109.8 kg)   BMI 39.06 kg/m   Pelvic exam:  Two IUD strings present seen coming from the cervical os. EGBUS, vaginal vault and cervix: within normal limits  IUD Removal Strings of IUD identified and grasped.  IUD removed without problem with ring forceps.  Pt tolerated this well.  IUD noted to be intact.  Assessment:  Encounter for removal of intrauterine contraceptive device (IUD)  Abnormal uterine bleeding (AUB) - Plan: TSH; With IUD. Most likely due to it being 88 1/48 yrs old. Rule out thyroid/DM. If labs neg, see if bleeding returns to normal in a few wks. If not, suggest GYN u/s.   Screening for diabetes mellitus - Plan: Hemoglobin A1c  Thyroid disorder screening - Plan: TSH  Weight gain - Plan: TSH, Hemoglobin A1c  Hormone disorder - Plan: Estradiol, Testosterone,Free and Total, Progesterone; Labs need to be checked for wt loss trainer. Will f/u with results.    Plan: IUD removed and plan for contraception is abstinence. She was amenable to this plan.  Eidan Muellner B. Dani Wallner, PA-C 03/02/2020 4:55 PM

## 2020-03-02 NOTE — Patient Instructions (Signed)
I value your feedback and entrusting us with your care. If you get a [facility] patient survey, I would appreciate you taking the time to let us know about your experience today. Thank you!  As of October 24, 2019, your lab results will be released to your MyChart immediately, before I even have a chance to see them. Please give me time to review them and contact you if there are any abnormalities. Thank you for your patience.  

## 2020-03-03 ENCOUNTER — Ambulatory Visit: Payer: Medicaid Other | Attending: Internal Medicine

## 2020-03-03 DIAGNOSIS — Z23 Encounter for immunization: Secondary | ICD-10-CM

## 2020-03-03 NOTE — Progress Notes (Signed)
   Covid-19 Vaccination Clinic  Name:  CALANDRA MADURA    MRN: [id number] DOB: [birth year]  03/03/2020  Ms. Brayfield was observed post Covid-19 immunization for 15 minutes without incident. She was provided with Vaccine Information Sheet and instruction to access the V-Safe system.   Ms. Egner was instructed to call 911 with any severe reactions post vaccine: Marland Kitchen Difficulty breathing  . Swelling of face and throat  . A fast heartbeat  . A bad rash all over body  . Dizziness and weakness   Immunizations Administered    Name Date Dose VIS Date Route   Pfizer COVID-19 Vaccine 03/03/2020  4:52 PM 0.3 mL 01/08/2019 Intramuscular   Manufacturer: [employer], [employer]   Lot: [id number]   NDC: [address]-6

## 2020-03-16 ENCOUNTER — Other Ambulatory Visit: Payer: Self-pay

## 2020-03-16 ENCOUNTER — Other Ambulatory Visit: Payer: Medicaid Other

## 2020-03-16 DIAGNOSIS — R635 Abnormal weight gain: Secondary | ICD-10-CM

## 2020-03-16 DIAGNOSIS — Z131 Encounter for screening for diabetes mellitus: Secondary | ICD-10-CM

## 2020-03-16 DIAGNOSIS — Z1329 Encounter for screening for other suspected endocrine disorder: Secondary | ICD-10-CM

## 2020-03-16 DIAGNOSIS — E349 Endocrine disorder, unspecified: Secondary | ICD-10-CM

## 2020-03-16 DIAGNOSIS — N939 Abnormal uterine and vaginal bleeding, unspecified: Secondary | ICD-10-CM

## 2020-03-18 LAB — TESTOSTERONE,FREE AND TOTAL
Testosterone, Free: 3.5 pg/mL (ref 0.0–4.2)
Testosterone: 12 ng/dL (ref 8–48)

## 2020-03-18 LAB — PROGESTERONE: Progesterone: 0.1 ng/mL

## 2020-03-18 LAB — ESTRADIOL: Estradiol: 39.4 pg/mL

## 2020-03-18 LAB — HEMOGLOBIN A1C
Est. average glucose Bld gHb Est-mCnc: 123 mg/dL
Hgb A1c MFr Bld: 5.9 % — ABNORMAL HIGH (ref 4.8–5.6)

## 2020-03-18 LAB — TSH: TSH: 4.01 u[IU]/mL (ref 0.450–4.500)

## 2020-03-19 ENCOUNTER — Other Ambulatory Visit: Payer: Self-pay | Admitting: Obstetrics and Gynecology

## 2020-03-19 ENCOUNTER — Encounter: Payer: Self-pay | Admitting: Obstetrics and Gynecology

## 2020-03-19 DIAGNOSIS — R635 Abnormal weight gain: Secondary | ICD-10-CM

## 2020-03-20 ENCOUNTER — Other Ambulatory Visit: Payer: Medicaid Other

## 2020-03-20 NOTE — Telephone Encounter (Signed)
Spoke to pt, orders released, and she is good to go. Pt confirmed [facility] orders are in.

## 2020-03-21 LAB — T3: T3, Total: 128 ng/dL (ref 71–180)

## 2020-03-21 LAB — CORTISOL: Cortisol: 10.7 ug/dL

## 2020-03-21 LAB — T4, FREE: Free T4: 1.18 ng/dL (ref 0.82–1.77)

## 2020-07-07 NOTE — Patient Instructions (Addendum)
Preventive Care 40-48 Years Old, Female Preventive care refers to visits with your health care provider and lifestyle choices that can promote health and wellness. This includes:  A yearly physical exam. This may also be called an annual well check.  Regular dental visits and eye exams.  Immunizations.  Screening for certain conditions.  Healthy lifestyle choices, such as eating a healthy diet, getting regular exercise, not using drugs or products that contain nicotine and tobacco, and limiting alcohol use. What can I expect for my preventive care visit? Physical exam Your health care provider will check your:  Height and weight. This may be used to calculate body mass index (BMI), which tells if you are at a healthy weight.  Heart rate and blood pressure.  Skin for abnormal spots. Counseling Your health care provider may ask you questions about your:  Alcohol, tobacco, and drug use.  Emotional well-being.  Home and relationship well-being.  Sexual activity.  Eating habits.  Work and work environment.  Method of birth control.  Menstrual cycle.  Pregnancy history. What immunizations do I need?  Influenza (flu) vaccine  This is recommended every year. Tetanus, diphtheria, and pertussis (Tdap) vaccine  You may need a Td booster every 10 years. Varicella (chickenpox) vaccine  You may need this if you have not been vaccinated. Zoster (shingles) vaccine  You may need this after age 60. Measles, mumps, and rubella (MMR) vaccine  You may need at least one dose of MMR if you were born in 1957 or later. You may also need a second dose. Pneumococcal conjugate (PCV13) vaccine  You may need this if you have certain conditions and were not previously vaccinated. Pneumococcal polysaccharide (PPSV23) vaccine  You may need one or two doses if you smoke cigarettes or if you have certain conditions. Meningococcal conjugate (MenACWY) vaccine  You may need this if you  have certain conditions. Hepatitis A vaccine  You may need this if you have certain conditions or if you travel or work in places where you may be exposed to hepatitis A. Hepatitis B vaccine  You may need this if you have certain conditions or if you travel or work in places where you may be exposed to hepatitis B. Haemophilus influenzae type b (Hib) vaccine  You may need this if you have certain conditions. Human papillomavirus (HPV) vaccine  If recommended by your health care provider, you may need three doses over 6 months. You may receive vaccines as individual doses or as more than one vaccine together in one shot (combination vaccines). Talk with your health care provider about the risks and benefits of combination vaccines. What tests do I need? Blood tests  Lipid and cholesterol levels. These may be checked every 5 years, or more frequently if you are over 50 years old.  Hepatitis C test.  Hepatitis B test. Screening  Lung cancer screening. You may have this screening every year starting at age 55 if you have a 30-pack-year history of smoking and currently smoke or have quit within the past 15 years.  Colorectal cancer screening. All adults should have this screening starting at age 50 and continuing until age 75. Your health care provider may recommend screening at age 45 if you are at increased risk. You will have tests every 1-10 years, depending on your results and the type of screening test.  Diabetes screening. This is done by checking your blood sugar (glucose) after you have not eaten for a while (fasting). You may have this   done every 1-3 years.  Mammogram. This may be done every 1-2 years. Talk with your health care provider about when you should start having regular mammograms. This may depend on whether you have a family history of breast cancer.  BRCA-related cancer screening. This may be done if you have a family history of breast, ovarian, tubal, or peritoneal  cancers.  Pelvic exam and Pap test. This may be done every 3 years starting at age 60. Starting at age 7, this may be done every 5 years if you have a Pap test in combination with an HPV test. Other tests  Sexually transmitted disease (STD) testing.  Bone density scan. This is done to screen for osteoporosis. You may have this scan if you are at high risk for osteoporosis. Follow these instructions at home: Eating and drinking  Eat a diet that includes fresh fruits and vegetables, whole grains, lean protein, and low-fat dairy.  Take vitamin and mineral supplements as recommended by your health care provider.  Do not drink alcohol if: ? Your health care provider tells you not to drink. ? You are pregnant, may be pregnant, or are planning to become pregnant.  If you drink alcohol: ? Limit how much you have to 0-1 drink a day. ? Be aware of how much alcohol is in your drink. In the [place] one drink equals one 12 oz bottle of beer (355 mL), one 5 oz glass of wine (148 mL), or one 1 oz glass of hard liquor (44 mL). Lifestyle  Take daily care of your teeth and gums.  Stay active. Exercise for at least 30 minutes on 5 or more days each week.  Do not use any products that contain nicotine or tobacco, such as cigarettes, e-cigarettes, and chewing tobacco. If you need help quitting, ask your health care provider.  If you are sexually active, practice safe sex. Use a condom or other form of birth control (contraception) in order to prevent pregnancy and STIs (sexually transmitted infections).  If told by your health care provider, take low-dose aspirin daily starting at age 48. What's next?  Visit your health care provider once a year for a well check visit.  Ask your health care provider how often you should have your eyes and teeth checked.  Stay up to date on all vaccines. This information is not intended to replace advice given to you by your health care provider. Make sure you  discuss any questions you have with your health care provider. Document Revised: 07/12/2018 Document Reviewed: 07/12/2018 Elsevier Patient Education  2020 [place] Breast self-awareness is knowing how your breasts look and feel. Doing breast self-awareness is important. It allows you to catch a breast problem early while it is still small and can be treated. All women should do breast self-awareness, including women who have had breast implants. Tell your doctor if you notice a change in your breasts. What you need:  A mirror.  A well-lit room. How to do a breast self-exam A breast self-exam is one way to learn what is normal for your breasts and to check for changes. To do a breast self-exam: Look for changes  1. Take off all the clothes above your waist. 2. Stand in front of a mirror in a room with good lighting. 3. Put your hands on your hips. 4. Push your hands down. 5. Look at your breasts and nipples in the mirror to see if one breast or nipple looks different from the  other. Check to see if: ? The shape of one breast is different. ? The size of one breast is different. ? There are wrinkles, dips, and bumps in one breast and not the other. 6. Look at each breast for changes in the skin, such as: ? Redness. ? Scaly areas. 7. Look for changes in your nipples, such as: ? Liquid around the nipples. ? Bleeding. ? Dimpling. ? Redness. ? A change in where the nipples are. Feel for changes  1. Lie on your back on the floor. 2. Feel each breast. To do this, follow these steps: ? Pick a breast to feel. ? Put the arm closest to that breast above your head. ? Use your other arm to feel the nipple area of your breast. Feel the area with the pads of your three middle fingers by making small circles with your fingers. For the first circle, press lightly. For the second circle, press harder. For the third circle, press even harder. ? Keep making circles with  your fingers at the different pressures as you move down your breast. Stop when you feel your ribs. ? Move your fingers a little toward the center of your body. ? Start making circles with your fingers again, this time going up until you reach your collarbone. ? Keep making up-and-down circles until you reach your armpit. Remember to keep using the three pressures. ? Feel the other breast in the same way. 3. Sit or stand in the tub or shower. 4. With soapy water on your skin, feel each breast the same way you did in step 2 when you were lying on the floor. Write down what you find Writing down what you find can help you remember what to tell your doctor. Write down:  What is normal for each breast.  Any changes you find in each breast, including: ? The kind of changes you find. ? Whether you have pain. ? Size and location of any lumps.  When you last had your menstrual period. General tips  Check your breasts every month.  If you are breastfeeding, the best time to check your breasts is after you feed your baby or after you use a breast pump.  If you get menstrual periods, the best time to check your breasts is 5-7 days after your menstrual period is over.  With time, you will become comfortable with the self-exam, and you will begin to know if there are changes in your breasts. Contact a doctor if you:  See a change in the shape or size of your breasts or nipples.  See a change in the skin of your breast or nipples, such as red or scaly skin.  Have fluid coming from your nipples that is not normal.  Find a lump or thick area that was not there before.  Have pain in your breasts.  Have any concerns about your breast health. Summary  Breast self-awareness includes looking for changes in your breasts, as well as feeling for changes within your breasts.  Breast self-awareness should be done in front of a mirror in a well-lit room.  You should check your breasts every month.  If you get menstrual periods, the best time to check your breasts is 5-7 days after your menstrual period is over.  Let your doctor know of any changes you see in your breasts, including changes in size, changes on the skin, pain or tenderness, or fluid from your nipples that is not normal. This information is not  intended to replace advice given to you by your health care provider. Make sure you discuss any questions you have with your health care provider. Document Revised: 06/19/2018 Document Reviewed: 06/19/2018 Elsevier Patient Education  2020 [employer].   Oral Contraception Information Oral contraceptive pills (OCPs) are medicines taken to prevent pregnancy. OCPs are taken by mouth, and they work by:  Preventing the ovaries from releasing eggs.  Thickening mucus in the lower part of the uterus (cervix), which prevents sperm from entering the uterus.  Thinning the lining of the uterus (endometrium), which prevents a fertilized egg from attaching to the endometrium. OCPs are highly effective when taken exactly as prescribed. However, OCPs do not prevent STIs (sexually transmitted infections). Safe sex practices, such as using condoms while on an OCP, can help prevent STIs. Before starting OCPs Before you start taking OCPs, you may have a physical exam, blood test, and Pap test. However, you are not required to have a pelvic exam in order to be prescribed OCPs. Your health care provider will make sure you are a good candidate for oral contraception. OCPs are not a good option for certain women, including women who smoke and are older than 35 years, and women with a medical history of high blood pressure, deep vein thrombosis, pulmonary embolism, stroke, cardiovascular disease, or peripheral vascular disease. Discuss with your health care provider the possible side effects of the OCP you may be prescribed. When you start an OCP, be aware that it can take 2-3 months for your body to adjust to  changes in hormone levels. Follow instructions from your health care provider about how to start taking your first cycle of OCPs. Depending on when you start the pill, you may need to use a backup form of birth control, such as condoms, during the first week. Make sure you know what steps to take if you ever forget to take the pill. Types of oral contraception  The most common types of birth control pills contain the hormones estrogen and progestin (synthetic progesterone) or progestin only. The combination pill This type of pill contains estrogen and progestin hormones. Combination pills often come in packs of 21, 28, or 91 pills. For each pack, the last 7 pills may not contain hormones, which means you may stop taking the pills for 7 days. Menstrual bleeding occurs during the week that you do not take the pills or that you take the pills with no hormones in them. The minipill This type of pill contains the progestin hormone only. It comes in packs of 28 pills. All 28 pills contain the hormone. You take the pill every day. It is very important to take the pill at the same time each day. Advantages of oral contraceptive pills  Provides reliable and continuous contraception if taken as instructed.  May treat or decrease symptoms of: ? Menstrual period cramps. ? Irregular menstrual cycle or bleeding. ? Heavy menstrual flow. ? Abnormal uterine bleeding. ? Acne, depending on the type of pill. ? Polycystic ovarian syndrome. ? Endometriosis. ? Iron deficiency anemia. ? Premenstrual symptoms, including premenstrual dysphoric disorder.  May reduce the risk of endometrial and ovarian cancer.  Can be used as emergency contraception.  Prevents mislocated (ectopic) pregnancies and infections of the fallopian tubes. Things that can make oral contraceptive pills less effective OCPs can be less effective if:  You forget to take the pill at the same time every day. This is especially important when  taking the minipill.  You have a  stomach or intestinal disease that reduces your body's ability to absorb the pill.  You take OCPs with other medicines that make OCPs less effective, such as antibiotics, certain HIV medicines, and some seizure medicines.  You take expired OCPs.  You forget to restart the pill on day 7, if using the packs of 21 pills. Risks associated with oral contraceptive pills Oral contraceptive pills can sometimes cause side effects, such as:  Headache.  Depression.  Trouble sleeping.  Nausea and vomiting.  Breast tenderness.  Irregular bleeding or spotting during the first several months.  Bloating or fluid retention.  Increase in blood pressure. Combination pills are also associated with a small increase in the risk of:  Blood clots.  Heart attack.  Stroke. Summary  Oral contraceptive pills are medicines taken by mouth to prevent pregnancy. They are highly effective when taken exactly as prescribed.  The most common types of birth control pills contain the hormones estrogen and progestin (synthetic progesterone) or progestin only.  Before you start taking the pill, you may have a physical exam, blood test, and Pap test. Your health care provider will make sure you are a good candidate for oral contraception.  The combination pill may come in a 21-day pack, a 28-day pack, or a 91-day pack. The minipill contains the progesterone hormone only and comes in packs of 28 pills.  Oral contraceptive pills can sometimes cause side effects, such as headache, nausea, breast tenderness, or irregular bleeding. This information is not intended to replace advice given to you by your health care provider. Make sure you discuss any questions you have with your health care provider. Document Revised: 10/13/2017 Document Reviewed: 01/24/2017 Elsevier Patient Education  [place].

## 2020-07-07 NOTE — Progress Notes (Signed)
Pt present to est care. Pt stated having her IUD removed due to it was expired and causing cycle issues. Pt would like to discuss getting STD screening and her cycle issues.

## 2020-07-08 ENCOUNTER — Other Ambulatory Visit: Payer: Self-pay

## 2020-07-08 ENCOUNTER — Ambulatory Visit (INDEPENDENT_AMBULATORY_CARE_PROVIDER_SITE_OTHER): Payer: 59 | Admitting: Obstetrics and Gynecology

## 2020-07-08 ENCOUNTER — Encounter: Payer: Self-pay | Admitting: Obstetrics and Gynecology

## 2020-07-08 VITALS — BP 123/80 | HR 83 | Ht 66.0 in | Wt 237.5 lb

## 2020-07-08 DIAGNOSIS — Z1231 Encounter for screening mammogram for malignant neoplasm of breast: Secondary | ICD-10-CM

## 2020-07-08 DIAGNOSIS — E669 Obesity, unspecified: Secondary | ICD-10-CM

## 2020-07-08 DIAGNOSIS — Z01419 Encounter for gynecological examination (general) (routine) without abnormal findings: Secondary | ICD-10-CM | POA: Diagnosis not present

## 2020-07-08 DIAGNOSIS — N920 Excessive and frequent menstruation with regular cycle: Secondary | ICD-10-CM

## 2020-07-08 DIAGNOSIS — Z113 Encounter for screening for infections with a predominantly sexual mode of transmission: Secondary | ICD-10-CM

## 2020-07-08 DIAGNOSIS — R7303 Prediabetes: Secondary | ICD-10-CM

## 2020-07-08 DIAGNOSIS — Z0000 Encounter for general adult medical examination without abnormal findings: Secondary | ICD-10-CM | POA: Diagnosis not present

## 2020-07-08 NOTE — Progress Notes (Addendum)
GYNECOLOGY ANNUAL PHYSICAL EXAM PROGRESS NOTE  Subjective:    Frances Rodgers is a 49 y.o. [id number] married female who presents to establish care, and for an annual exam. She was [facility] a patient [facility].  The patient is not currently sexually active.  The patient wears seatbelts: yes. The patient participates in regular exercise: not currently. Has the patient ever been transfused or tattooed?: no.   The patient has the following concerns today:  1. Frances Rodgers would like to discuss birth-control options. She reports that she had her Mirena IUD removed in May of this year. She states that her cycles are heavy, lasting 7 to 8 days, with five of those days having heavy flow. She denies passage of clots or dysmenorrhea. She reports that while she had the ID her cycles never really decreased much in flow.  She states that her cycles have always been heavy, since onset. Has also utilized OCPs and NuvaRing in the past.  2. Frances Rodgers also desires to have STD screening performed.  Notes concerns of partner infidelity a few months ago.  Does not think that she has any sort of infection as she is not having any symptoms, but still desires screening today.   Gynecologic History Contraception: none  History of STI's: Denies Last Pap: 04/17/2019. Results were: normal.  Denies h/o abnormal pap smears. Last mammogram: ~ 04/2019 [facility] Results were: normal   Menstrual History:  Menarche age: [age] or [age] Patient's last menstrual period was 07/01/2020. Period Duration (Days): 7-8 Period Pattern: Regular Menstrual Flow: Heavy Menstrual Control: Maxi pad Menstrual Control Change Freq (Hours): 2-3 Dysmenorrhea: None    OB History  Gravida Para Term Preterm AB Living  1 1 1  0 0 1  SAB TAB Ectopic Multiple Live Births  0 0 0 0 1    # Outcome Date GA Lbr Len/2nd Weight Sex Delivery Anes PTL Lv  1 Term 04/09/95    F Vag-Spont   LIV    Past Medical History:  Diagnosis  Date  . Asthma   . GERD (gastroesophageal reflux disease)   . Seasonal allergies     Past Surgical History:  Procedure Laterality Date  . GASTRIC BYPASS  2015  . NASAL POLYP SURGERY  2010  . WISDOM TOOTH EXTRACTION      Family History  Problem Relation Age of Onset  . Diabetes Mother   . Diabetes Brother   . Pancreatic cancer Maternal Grandmother     Social History   Socioeconomic History  . Marital status: Single    Spouse name: Not on file  . Number of children: Not on file  . Years of education: Not on file  . Highest education level: Not on file  Occupational History  . Not on file  Tobacco Use  . Smoking status: Never Smoker  . Smokeless tobacco: Current User  Vaping Use  . Vaping Use: Never used  Substance and Sexual Activity  . Alcohol use: Never  . Drug use: Never  . Sexual activity: Not Currently    Birth control/protection: None  Other Topics Concern  . Not on file  Social History Narrative  . Not on file   Social Determinants of Health   Financial Resource Strain:   . Difficulty of Paying Living Expenses: Not on file  Food Insecurity:   . Worried About Programme researcher, broadcasting/film/videounning Out of Food in the Last Year: Not on file  . Ran Out of Food in the Last Year:  Not on file  Transportation Needs:   . Lack of Transportation (Medical): Not on file  . Lack of Transportation (Non-Medical): Not on file  Physical Activity:   . Days of Exercise per Week: Not on file  . Minutes of Exercise per Session: Not on file  Stress:   . Feeling of Stress : Not on file  Social Connections:   . Frequency of Communication with Friends and Family: Not on file  . Frequency of Social Gatherings with Friends and Family: Not on file  . Attends Religious Services: Not on file  . Active Member of Clubs or Organizations: Not on file  . Attends Banker Meetings: Not on file  . Marital Status: Not on file  Intimate Partner Violence:   . Fear of Current or Ex-Partner: Not on file    . Emotionally Abused: Not on file  . Physically Abused: Not on file  . Sexually Abused: Not on file    Current Outpatient Medications on File Prior to Visit  Medication Sig Dispense Refill  . albuterol (PROVENTIL HFA) 108 (90 Base) MCG/ACT inhaler Inhale into the lungs every 6 (six) hours as needed for wheezing or shortness of breath.    . Azelastine HCl 137 MCG/SPRAY SOLN     . Cetirizine HCl (ZYRTEC ALLERGY) 10 MG CAPS Take by mouth.    . Fluticasone Propionate (XHANCE) 93 MCG/ACT EXHU     . montelukast (SINGULAIR) 10 MG tablet Take by mouth.    . pantoprazole (PROTONIX) 40 MG tablet ONE TABLE 30 MINUTES PRIOR TO FIRST MEAL OF DAY    . QVAR REDIHALER 80 MCG/ACT inhaler INHALE 1 PUFF BY MOUTH TWICE A DAY     No current facility-administered medications on file prior to visit.    Allergies  Allergen Reactions  . Aspirin Anaphylaxis and Shortness Of Breath  . Ketorolac Anaphylaxis  . Ketorolac Tromethamine Shortness Of Breath     Review of Systems Constitutional: negative for chills, fatigue, fevers and sweats Eyes: negative for irritation, redness and visual disturbance Ears, nose, mouth, throat, and face: negative for hearing loss, nasal congestion, snoring and tinnitus Respiratory: negative for asthma, cough, sputum Cardiovascular: negative for chest pain, dyspnea, exertional chest pressure/discomfort, irregular heart beat, palpitations and syncope Gastrointestinal: negative for abdominal pain, change in bowel habits, nausea and vomiting Genitourinary: positive for heavy menstrual periods. Negative for genital lesions, sexual problems and vaginal discharge, dysuria and urinary incontinence Integument/breast: negative for breast lump, breast tenderness and nipple discharge Hematologic/lymphatic: negative for bleeding and easy bruising Musculoskeletal:negative for back pain and muscle weakness Neurological: negative for dizziness, headaches, vertigo and weakness Endocrine:  negative for diabetic symptoms including polydipsia, polyuria and skin dryness Allergic/Immunologic: negative for hay fever and urticaria       Objective:  Blood pressure 123/80, pulse 83, height 5\' 6"  (1.676 m), weight 237 lb 8 oz (107.7 kg), last menstrual period 07/01/2020. Body mass index is 38.33 kg/m.  General Appearance:    Alert, cooperative, no distress, appears stated age, moderate obesity  Head:    Normocephalic, without obvious abnormality, atraumatic  Eyes:    PERRL, conjunctiva/corneas clear, EOM's intact, both eyes  Ears:    Normal external ear canals, both ears  Nose:   Nares normal, septum midline, mucosa normal, no drainage or sinus tenderness  Throat:   Lips, mucosa, and tongue normal; teeth and gums normal  Neck:   Supple, symmetrical, trachea midline, no adenopathy; thyroid: no enlargement/tenderness/nodules; no carotid bruit or JVD  Back:  Symmetric, no curvature, ROM normal, no CVA tenderness  Lungs:     Clear to auscultation bilaterally, respirations unlabored  Chest Wall:    No tenderness or deformity   Heart:    Regular rate and rhythm, S1 and S2 normal, no murmur, rub or gallop  Breast Exam:    No tenderness, masses, or nipple abnormality. Left breast slightly larger than right.   Abdomen:     Soft, non-tender, bowel sounds active all four quadrants, no masses, no organomegaly.    Genitalia:    Pelvic:external genitalia normal, vagina without lesions, discharge, or tenderness, rectovaginal septum  normal. Cervix normal in appearance, no cervical motion tenderness, no adnexal masses or tenderness.  Uterus normal size, shape, mobile, regular contours, nontender.  Rectal:    Normal external sphincter.  No hemorrhoids appreciated. Internal exam not done.   Extremities:   Extremities normal, atraumatic, no cyanosis or edema  Pulses:   2+ and symmetric all extremities  Skin:   Skin color, texture, turgor normal, no rashes or lesions  Lymph nodes:   Cervical,  supraclavicular, and axillary nodes normal  Neurologic:   CNII-XII intact, normal strength, sensation and reflexes throughout   .  Labs:  Lab Results  Component Value Date   WBC 5.6 12/24/2013   HGB 8.6 (L) 12/24/2013   HCT 28.8 (L) 12/24/2013   MCV 62 (L) 12/24/2013   PLT 210 12/24/2013    Lab Results  Component Value Date   CREATININE 1.06 12/24/2013   BUN 8 12/24/2013   NA 137 12/24/2013   K 3.8 12/24/2013   CL 103 12/24/2013   CO2 26 12/24/2013    Lab Results  Component Value Date   ALT 25 12/24/2013   AST 17 12/24/2013   ALKPHOS 76 12/24/2013   BILITOT 0.5 12/24/2013    Lab Results  Component Value Date   TSH 4.010 03/16/2020    Lab Results  Component Value Date   HGBA1C 5.9 (H) 03/16/2020    Lipid Panel  No results found for: CHOL, TRIG, HDL, CHOLHDL, VLDL, LDLCALC, LDLDIRECT, LABVLDL   Assessment:   1. Encounter for well woman exam with routine gynecological exam   2. Encounter for medical examination to establish care   3. Breast cancer screening by mammogram   4. Menorrhagia with regular cycle   5. Screen for STD (sexually transmitted disease)   6. Obesity (BMI 35.0-39.9 without comorbidity)   7. Prediabetes     Plan:    - Blood tests: CBC with diff and Comprehensive metabolic panel. Other labs reviewed.  - Breast self exam technique reviewed and patient encouraged to perform self-exam monthly. - Contraception: Education given regarding options for contraception, including barrier methods, injectable contraception, IUD placement, oral contraceptives, NuvaRing, Nexplanon, or permanent sterilization. Patient elects trial of OCPs. One month sample given. To notify MD if prescription desired.  - Discussed healthy lifestyle modifications. - Mammogram ordered. - Pap smear up to date.  Continue routine screening every 3 years.  - STD screening performed at patient's request, including HIV.  - Pre-diabetes, patient notes she has been working with a  nutritionist since last December. Has lost 30 lbs.  Continued to encourage weight loss journey. Will need to recheck levels in 6-12 months from last level.  COVID vaccination status: patient has completed vaccination series.      Upstream - 07/08/20 1511      Pregnancy Intention Screening   Does the patient want to become pregnant in the next year? No  Does the patient's partner want to become pregnant in the next year? No    Would the patient like to discuss contraceptive options today? Yes      Contraception Wrap Up   Current Method No Method - Other Reason    End Method Oral Contraceptive    Contraception Counseling Provided Yes          The pregnancy intention screening data noted above was reviewed. Potential methods of contraception were discussed. The patient elected to proceed with Oral Contraceptive. Given sample of Balcoltra.  Patient to begin today, taking 1 pill every 12 hours until on current day's pill.    Hildred Laser, MD [facility]

## 2020-07-09 ENCOUNTER — Encounter: Payer: Self-pay | Admitting: Obstetrics and Gynecology

## 2020-07-09 DIAGNOSIS — E668 Other obesity: Secondary | ICD-10-CM | POA: Insufficient documentation

## 2020-07-09 DIAGNOSIS — D509 Iron deficiency anemia, unspecified: Secondary | ICD-10-CM | POA: Insufficient documentation

## 2020-07-09 DIAGNOSIS — N920 Excessive and frequent menstruation with regular cycle: Secondary | ICD-10-CM | POA: Insufficient documentation

## 2020-07-09 LAB — COMPREHENSIVE METABOLIC PANEL
ALT: 10 IU/L (ref 0–32)
AST: 14 IU/L (ref 0–40)
Albumin/Globulin Ratio: 1.2 (ref 1.2–2.2)
Albumin: 4 g/dL (ref 3.8–4.8)
Alkaline Phosphatase: 81 IU/L (ref 48–121)
BUN/Creatinine Ratio: 8 — ABNORMAL LOW (ref 9–23)
BUN: 7 mg/dL (ref 6–24)
Bilirubin Total: 0.3 mg/dL (ref 0.0–1.2)
CO2: 25 mmol/L (ref 20–29)
Calcium: 9.3 mg/dL (ref 8.7–10.2)
Chloride: 101 mmol/L (ref 96–106)
Creatinine, Ser: 0.86 mg/dL (ref 0.57–1.00)
GFR calc Af Amer: 92 mL/min/{1.73_m2} (ref >59)
GFR calc non Af Amer: 80 mL/min/{1.73_m2} (ref >59)
Globulin, Total: 3.3 g/dL (ref 1.5–4.5)
Glucose: 97 mg/dL (ref 65–99)
Potassium: 4.4 mmol/L (ref 3.5–5.2)
Sodium: 137 mmol/L (ref 134–144)
Total Protein: 7.3 g/dL (ref 6.0–8.5)

## 2020-07-09 LAB — HIV ANTIBODY (ROUTINE TESTING W REFLEX): HIV Screen 4th Generation wRfx: NONREACTIVE

## 2020-07-09 LAB — CBC
Hematocrit: 30.6 % — ABNORMAL LOW (ref 34.0–46.6)
Hemoglobin: 8.6 g/dL — ABNORMAL LOW (ref 11.1–15.9)
MCH: 18.9 pg — ABNORMAL LOW (ref 26.6–33.0)
MCHC: 28.1 g/dL — ABNORMAL LOW (ref 31.5–35.7)
MCV: 67 fL — ABNORMAL LOW (ref 79–97)
Platelets: 282 10*3/uL (ref 150–450)
RBC: 4.56 x10E6/uL (ref 3.77–5.28)
RDW: 16.8 % — ABNORMAL HIGH (ref 11.7–15.4)
WBC: 6.5 10*3/uL (ref 3.4–10.8)

## 2020-07-09 LAB — RPR: RPR Ser Ql: NONREACTIVE

## 2020-07-10 ENCOUNTER — Other Ambulatory Visit (HOSPITAL_COMMUNITY)
Admission: RE | Admit: 2020-07-10 | Discharge: 2020-07-10 | Disposition: A | Discharge status: Home / Self Care | Payer: 59 | Source: Ambulatory Visit | Attending: Obstetrics and Gynecology | Admitting: Obstetrics and Gynecology

## 2020-07-10 DIAGNOSIS — Z113 Encounter for screening for infections with a predominantly sexual mode of transmission: Secondary | ICD-10-CM | POA: Insufficient documentation

## 2020-07-10 NOTE — Addendum Note (Signed)
Addended by: Silvano Bilis on: 07/10/2020 09:10 AM   Modules accepted: Orders

## 2020-07-10 NOTE — Addendum Note (Signed)
Addended by: Silvano Bilis on: 07/10/2020 09:14 AM   Modules accepted: Orders

## 2020-07-13 LAB — URINE CYTOLOGY ANCILLARY ONLY
Bacterial Vaginitis-Urine: POSITIVE — AB
Candida Urine: POSITIVE — AB
Chlamydia: NEGATIVE
Comment: NEGATIVE
Comment: NEGATIVE
Comment: NORMAL
Neisseria Gonorrhea: NEGATIVE
Trichomonas: NEGATIVE

## 2020-07-13 MED ORDER — METRONIDAZOLE 500 MG PO TABS
500.0000 mg | ORAL_TABLET | Freq: Two times a day (BID) | ORAL | 0 refills | Status: DC
Start: 2020-07-13 — End: 2020-07-15

## 2020-07-13 MED ORDER — FLUCONAZOLE 150 MG PO TABS: 150.0000 mg | ORAL_TABLET | Freq: Once | ORAL | 1 refills | Status: AC

## 2020-07-13 NOTE — Addendum Note (Signed)
Addended by: Fabian November on: 07/13/2020 02:43 PM   Modules accepted: Orders

## 2020-07-15 ENCOUNTER — Other Ambulatory Visit: Payer: Self-pay

## 2020-07-15 MED ORDER — METRONIDAZOLE 500 MG PO TABS
500.0000 mg | ORAL_TABLET | Freq: Two times a day (BID) | ORAL | 0 refills | Status: DC
Start: 2020-07-15 — End: 2021-08-13

## 2020-07-29 ENCOUNTER — Other Ambulatory Visit: Payer: Self-pay

## 2020-07-29 MED ORDER — BALCOLTRA 0.1-20 MG-MCG(21) PO TABS: 20.0000 mg | ORAL_TABLET | Freq: Every day | ORAL | 11 refills | Status: DC

## 2020-07-31 ENCOUNTER — Other Ambulatory Visit: Payer: Self-pay

## 2020-07-31 MED ORDER — BALCOLTRA 0.1-20 MG-MCG(21) PO TABS: 20.0000 mg | ORAL_TABLET | Freq: Every day | ORAL | 11 refills | Status: DC

## 2020-08-04 ENCOUNTER — Other Ambulatory Visit: Payer: Self-pay

## 2020-08-04 MED ORDER — BALCOLTRA 0.1-20 MG-MCG(21) PO TABS: ORAL_TABLET | ORAL | 11 refills | Status: DC

## 2020-09-04 ENCOUNTER — Other Ambulatory Visit: Payer: Self-pay | Admitting: Obstetrics and Gynecology

## 2020-09-04 MED ORDER — AUROVELA 24 FE 1-20 MG-MCG(24) PO TABS: 1.0000 | ORAL_TABLET | Freq: Every day | ORAL | 3 refills | Status: DC

## 2020-09-09 ENCOUNTER — Other Ambulatory Visit: Payer: Self-pay

## 2020-09-09 MED ORDER — AUROVELA 24 FE 1-20 MG-MCG(24) PO TABS
1.0000 | ORAL_TABLET | Freq: Every day | ORAL | 3 refills | Status: DC
Start: 2020-09-09 — End: 2021-06-10

## 2021-06-09 ENCOUNTER — Other Ambulatory Visit: Payer: Self-pay | Admitting: Obstetrics and Gynecology

## 2021-08-11 ENCOUNTER — Other Ambulatory Visit: Payer: Self-pay | Admitting: Obstetrics and Gynecology

## 2021-08-12 NOTE — Patient Instructions (Signed)
Preventive Care 40-49 Years Old, Female Preventive care refers to lifestyle choices and visits with your health care provider that can promote health and wellness. This includes: A yearly physical exam. This is also called an annual wellness visit. Regular dental and eye exams. Immunizations. Screening for certain conditions. Healthy lifestyle choices, such as: Eating a healthy diet. Getting regular exercise. Not using drugs or products that contain nicotine and tobacco. Limiting alcohol use. What can I expect for my preventive care visit? Physical exam Your health care provider will check your: Height and weight. These may be used to calculate your BMI (body mass index). BMI is a measurement that tells if you are at a healthy weight. Heart rate and blood pressure. Body temperature. Skin for abnormal spots. Counseling Your health care provider may ask you questions about your: Past medical problems. Family's medical history. Alcohol, tobacco, and drug use. Emotional well-being. Home life and relationship well-being. Sexual activity. Diet, exercise, and sleep habits. Work and work environment. Access to firearms. Method of birth control. Menstrual cycle. Pregnancy history. What immunizations do I need? Vaccines are usually given at various ages, according to a schedule. Your health care provider will recommend vaccines for you based on your age, medical history, and lifestyle or other factors, such as travel or where you work. What tests do I need? Blood tests Lipid and cholesterol levels. These may be checked every 5 years, or more often if you are over 50 years old. Hepatitis C test. Hepatitis B test. Screening Lung cancer screening. You may have this screening every year starting at age 55 if you have a 30-pack-year history of smoking and currently smoke or have quit within the past 15 years. Colorectal cancer screening. All adults should have this screening starting at  age 50 and continuing until age 75. Your health care provider may recommend screening at age 45 if you are at increased risk. You will have tests every 1-10 years, depending on your results and the type of screening test. Diabetes screening. This is done by checking your blood sugar (glucose) after you have not eaten for a while (fasting). You may have this done every 1-3 years. Mammogram. This may be done every 1-2 years. Talk with your health care provider about when you should start having regular mammograms. This may depend on whether you have a family history of breast cancer. BRCA-related cancer screening. This may be done if you have a family history of breast, ovarian, tubal, or peritoneal cancers. Pelvic exam and Pap test. This may be done every 3 years starting at age 21. Starting at age 30, this may be done every 5 years if you have a Pap test in combination with an HPV test. Other tests STD (sexually transmitted disease) testing, if you are at risk. Bone density scan. This is done to screen for osteoporosis. You may have this scan if you are at high risk for osteoporosis. Talk with your health care provider about your test results, treatment options, and if necessary, the need for more tests. Follow these instructions at home: Eating and drinking  Eat a diet that includes fresh fruits and vegetables, whole grains, lean protein, and low-fat dairy products. Take vitamin and mineral supplements as recommended by your health care provider. Do not drink alcohol if: Your health care provider tells you not to drink. You are pregnant, may be pregnant, or are planning to become pregnant. If you drink alcohol: Limit how much you have to 0-1 drink a day. Be   aware of how much alcohol is in your drink. In the [place] one drink equals one 12 oz bottle of beer (355 mL), one 5 oz glass of wine (148 mL), or one 1 oz glass of hard liquor (44 mL). Lifestyle Take daily care of your teeth and  gums. Brush your teeth every morning and night with fluoride toothpaste. Floss one time each day. Stay active. Exercise for at least 30 minutes 5 or more days each week. Do not use any products that contain nicotine or tobacco, such as cigarettes, e-cigarettes, and chewing tobacco. If you need help quitting, ask your health care provider. Do not use drugs. If you are sexually active, practice safe sex. Use a condom or other form of protection to prevent STIs (sexually transmitted infections). If you do not wish to become pregnant, use a form of birth control. If you plan to become pregnant, see your health care provider for a prepregnancy visit. If told by your health care provider, take low-dose aspirin daily starting at age 30. Find healthy ways to cope with stress, such as: Meditation, yoga, or listening to music. Journaling. Talking to a trusted person. Spending time with friends and family. Safety Always wear your seat belt while driving or riding in a vehicle. Do not drive: If you have been drinking alcohol. Do not ride with someone who has been drinking. When you are tired or distracted. While texting. Wear a helmet and other protective equipment during sports activities. If you have firearms in your house, make sure you follow all gun safety procedures. What's next? Visit your health care provider once a year for an annual wellness visit. Ask your health care provider how often you should have your eyes and teeth checked. Stay up to date on all vaccines. This information is not intended to replace advice given to you by your health care provider. Make sure you discuss any questions you have with your health care provider. Document Revised: 01/08/2021 Document Reviewed: 07/12/2018 Elsevier Patient Education  2022 [place] Breast self-awareness is knowing how your breasts look and feel. Doing breast self-awareness is important. It allows you to  catch a breast problem early while it is still small and can be treated. All women should do breast self-awareness, including women who have had breast implants. Tell your doctor if you notice a change in your breasts. What you need: A mirror. A well-lit room. How to do a breast self-exam A breast self-exam is one way to learn what is normal for your breasts and to check for changes. To do a breast self-exam: Look for changes  Take off all the clothes above your waist. Stand in front of a mirror in a room with good lighting. Put your hands on your hips. Push your hands down. Look at your breasts and nipples in the mirror to see if one breast or nipple looks different from the other. Check to see if: The shape of one breast is different. The size of one breast is different. There are wrinkles, dips, and bumps in one breast and not the other. Look at each breast for changes in the skin, such as: Redness. Scaly areas. Look for changes in your nipples, such as: Liquid around the nipples. Bleeding. Dimpling. Redness. A change in where the nipples are. Feel for changes  Lie on your back on the floor. Feel each breast. To do this, follow these steps: Pick a breast to feel. Put the arm closest to  that breast above your head. Use your other arm to feel the nipple area of your breast. Feel the area with the pads of your three middle fingers by making small circles with your fingers. For the first circle, press lightly. For the second circle, press harder. For the third circle, press even harder. Keep making circles with your fingers at the different pressures as you move down your breast. Stop when you feel your ribs. Move your fingers a little toward the center of your body. Start making circles with your fingers again, this time going up until you reach your collarbone. Keep making up-and-down circles until you reach your armpit. Remember to keep using the three pressures. Feel the other  breast in the same way. Sit or stand in the tub or shower. With soapy water on your skin, feel each breast the same way you did in step 2 when you were lying on the floor. Write down what you find Writing down what you find can help you remember what to tell your doctor. Write down: What is normal for each breast. Any changes you find in each breast, including: The kind of changes you find. Whether you have pain. Size and location of any lumps. When you last had your menstrual period. General tips Check your breasts every month. If you are breastfeeding, the best time to check your breasts is after you feed your baby or after you use a breast pump. If you get menstrual periods, the best time to check your breasts is 5-7 days after your menstrual period is over. With time, you will become comfortable with the self-exam, and you will begin to know if there are changes in your breasts. Contact a doctor if you: See a change in the shape or size of your breasts or nipples. See a change in the skin of your breast or nipples, such as red or scaly skin. Have fluid coming from your nipples that is not normal. Find a lump or thick area that was not there before. Have pain in your breasts. Have any concerns about your breast health. Summary Breast self-awareness includes looking for changes in your breasts, as well as feeling for changes within your breasts. Breast self-awareness should be done in front of a mirror in a well-lit room. You should check your breasts every month. If you get menstrual periods, the best time to check your breasts is 5-7 days after your menstrual period is over. Let your doctor know of any changes you see in your breasts, including changes in size, changes on the skin, pain or tenderness, or fluid from your nipples that is not normal. This information is not intended to replace advice given to you by your health care provider. Make sure you discuss any questions you have  with your health care provider. Document Revised: 06/19/2018 Document Reviewed: 06/19/2018 Elsevier Patient Education  [place].

## 2021-08-13 ENCOUNTER — Other Ambulatory Visit: Payer: Self-pay

## 2021-08-13 ENCOUNTER — Ambulatory Visit (INDEPENDENT_AMBULATORY_CARE_PROVIDER_SITE_OTHER): Payer: No Typology Code available for payment source | Admitting: Obstetrics and Gynecology

## 2021-08-13 ENCOUNTER — Encounter: Payer: Self-pay | Admitting: Obstetrics and Gynecology

## 2021-08-13 VITALS — BP 140/80 | HR 109 | Ht 66.0 in | Wt 246.9 lb

## 2021-08-13 DIAGNOSIS — Z01419 Encounter for gynecological examination (general) (routine) without abnormal findings: Secondary | ICD-10-CM | POA: Diagnosis not present

## 2021-08-13 DIAGNOSIS — N920 Excessive and frequent menstruation with regular cycle: Secondary | ICD-10-CM

## 2021-08-13 DIAGNOSIS — R7303 Prediabetes: Secondary | ICD-10-CM

## 2021-08-13 DIAGNOSIS — Z13220 Encounter for screening for lipoid disorders: Secondary | ICD-10-CM | POA: Diagnosis not present

## 2021-08-13 DIAGNOSIS — E669 Obesity, unspecified: Secondary | ICD-10-CM

## 2021-08-13 DIAGNOSIS — Z1211 Encounter for screening for malignant neoplasm of colon: Secondary | ICD-10-CM

## 2021-08-13 DIAGNOSIS — Z862 Personal history of diseases of the blood and blood-forming organs and certain disorders involving the immune mechanism: Secondary | ICD-10-CM

## 2021-08-13 NOTE — Progress Notes (Signed)
GYNECOLOGY ANNUAL PHYSICAL EXAM PROGRESS NOTE  Subjective:    Frances Rodgers is a 50 y.o. G35P1001 married female who presents for an annual exam.  The patient is not currently sexually active.  The patient wears seatbelts: yes. The patient participates in regular exercise: 30 minutes of walking daily. Has the patient ever been transfused or tattooed?: yes.   The patient has the following concerns today:  Frances Rodgers stated that she was having shoulder pain. Has been ongoing for ~ 1 month. Feels like a deep pinching sensation.   Gynecologic History Contraception: oral birth control History of STI's: Denies Last Pap: 05/09/2019. Results were: normal.  Denies h/o abnormal pap smears. Last mammogram: ~ 07/15/2021 (performed at [facility] in [place]). Results were: normal   Menstrual History:  Menarche age: 35 or 29 Patient's last menstrual period was 06/20/2021. Period Duration (Days): 7 Period Pattern: Regular Menstrual Flow: Heavy, Moderate, Light Menstrual Control: Maxi pad Menstrual Control Change Freq (Hours): 2-3 Dysmenorrhea: None    OB History  Gravida Para Term Preterm AB Living  1 1 1  0 0 1  SAB IAB Ectopic Multiple Live Births  0 0 0 0 1    # Outcome Date GA Lbr Len/2nd Weight Sex Delivery Anes PTL Lv  1 Term 04/09/95    F Vag-Spont   LIV    Past Medical History:  Diagnosis Date   Asthma    GERD (gastroesophageal reflux disease)    Seasonal allergies     Past Surgical History:  Procedure Laterality Date   GASTRIC BYPASS  2015   NASAL POLYP SURGERY  2010   WISDOM TOOTH EXTRACTION      Family History  Problem Relation Age of Onset   Diabetes Mother    Diabetes Brother    Pancreatic cancer Maternal Grandmother     Social History   Socioeconomic History   Marital status: Single    Spouse name: Not on file   Number of children: Not on file   Years of education: Not on file   Highest education level: Not on file  Occupational  History   Not on file  Tobacco Use   Smoking status: Never   Smokeless tobacco: Current  Vaping Use   Vaping Use: Never used  Substance and Sexual Activity   Alcohol use: Never   Drug use: Never   Sexual activity: Not Currently    Birth control/protection: None  Other Topics Concern   Not on file  Social History Narrative   Not on file   Social Determinants of Health   Financial Resource Strain: Not on file  Food Insecurity: Not on file  Transportation Needs: Not on file  Physical Activity: Not on file  Stress: Not on file  Social Connections: Not on file  Intimate Partner Violence: Not on file    Current Outpatient Medications on File Prior to Visit  Medication Sig Dispense Refill   albuterol (PROVENTIL HFA) 108 (90 Base) MCG/ACT inhaler Inhale into the lungs every 6 (six) hours as needed for wheezing or shortness of breath.     Azelastine HCl 137 MCG/SPRAY SOLN      Cetirizine HCl (ZYRTEC ALLERGY) 10 MG CAPS Take by mouth.     Fluticasone Propionate (XHANCE) 93 MCG/ACT EXHU      JUNEL FE 24 1-20 MG-MCG(24) tablet TAKE 1 TABLET BY MOUTH  DAILY 84 tablet 0   metroNIDAZOLE (FLAGYL) 500 MG tablet Take 1 tablet (500 mg total) by mouth 2 (two)  times daily. 14 tablet 0   montelukast (SINGULAIR) 10 MG tablet Take by mouth.     pantoprazole (PROTONIX) 40 MG tablet ONE TABLE 30 MINUTES PRIOR TO FIRST MEAL OF DAY     QVAR REDIHALER 80 MCG/ACT inhaler INHALE 1 PUFF BY MOUTH TWICE A DAY     No current facility-administered medications on file prior to visit.    Allergies  Allergen Reactions   Aspirin Anaphylaxis and Shortness Of Breath   Ketorolac Anaphylaxis   Ketorolac Tromethamine Shortness Of Breath     Review of Systems Constitutional: negative for chills, fatigue, fevers and sweats Eyes: negative for irritation, redness and visual disturbance Ears, nose, mouth, throat, and face: negative for hearing loss, nasal congestion, snoring and tinnitus Respiratory: negative  for asthma, cough, sputum Cardiovascular: negative for chest pain, dyspnea, exertional chest pressure/discomfort, irregular heart beat, palpitations and syncope Gastrointestinal: negative for abdominal pain, change in bowel habits, nausea and vomiting Genitourinary: Negative for genital lesions, sexual problems and vaginal discharge, dysuria and urinary incontinence Integument/breast: negative for breast lump, breast tenderness and nipple discharge Hematologic/lymphatic: negative for bleeding and easy bruising Musculoskeletal:negative for back pain and muscle weakness. Positive for left shoulder pain x 1 month.  Neurological: negative for dizziness, headaches, vertigo and weakness Endocrine: negative for diabetic symptoms including polydipsia, polyuria and skin dryness Allergic/Immunologic: negative for hay fever and urticaria       Objective:  BP 140/80 (BP Location: Left Arm, Patient Position: Sitting, Cuff Size: Normal)   Pulse (!) 109   Ht 5\' 6"  (1.676 m)   Wt 246 lb 14.4 oz (112 kg)   LMP 06/20/2021   BMI 39.85 kg/m    General Appearance:    Alert, cooperative, no distress, appears stated age, moderate obesity  Head:    Normocephalic, without obvious abnormality, atraumatic  Eyes:    PERRL, conjunctiva/corneas clear, EOM's intact, both eyes  Ears:    Normal external ear canals, both ears  Nose:   Nares normal, septum midline, mucosa normal, no drainage or sinus tenderness  Throat:   Lips, mucosa, and tongue normal; teeth and gums normal  Neck:   Supple, symmetrical, trachea midline, no adenopathy; thyroid: no enlargement/tenderness/nodules; no carotid bruit or JVD  Back:     Symmetric, no curvature, ROM normal, no CVA tenderness  Lungs:     Clear to auscultation bilaterally, respirations unlabored  Chest Wall:    No tenderness or deformity   Heart:    Regular rate and rhythm, S1 and S2 normal, no murmur, rub or gallop  Breast Exam:    No tenderness, masses, or nipple  abnormality. Left breast slightly larger than right.   Abdomen:     Soft, non-tender, bowel sounds active all four quadrants, no masses, no organomegaly.    Genitalia:    Pelvic:external genitalia normal, vagina without lesions, discharge, or tenderness, rectovaginal septum  normal. Cervix normal in appearance, no cervical motion tenderness, no adnexal masses or tenderness.  Uterus normal size, shape, mobile, regular contours, nontender.  Rectal:    Normal external sphincter.  No hemorrhoids appreciated. Internal exam not done.   Extremities:   Extremities normal, atraumatic, no cyanosis or edema  Pulses:   2+ and symmetric all extremities  Skin:   Skin color, texture, turgor normal, no rashes or lesions  Lymph nodes:   Cervical, supraclavicular, and axillary nodes normal  Neurologic:   CNII-XII intact, normal strength, sensation and reflexes throughout   .  Labs:  Lab Results  Component Value  Date   WBC 6.5 07/08/2020   HGB 8.6 (L) 07/08/2020   HCT 30.6 (L) 07/08/2020   MCV 67 (L) 07/08/2020   PLT 282 07/08/2020    Lab Results  Component Value Date   CREATININE 0.86 07/08/2020   BUN 7 07/08/2020   NA 137 07/08/2020   K 4.4 07/08/2020   CL 101 07/08/2020   CO2 25 07/08/2020    Lab Results  Component Value Date   ALT 10 07/08/2020   AST 14 07/08/2020   ALKPHOS 81 07/08/2020   BILITOT 0.3 07/08/2020    Lab Results  Component Value Date   TSH 4.010 03/16/2020    Lab Results  Component Value Date   HGBA1C 5.9 (H) 03/16/2020    Lipid Panel  No results found for: CHOL, TRIG, HDL, CHOLHDL, VLDL, LDLCALC, LDLDIRECT, LABVLDL   Assessment:   1. Encounter for well woman exam with routine gynecological exam   2. Screening for lipid disorders   3. Colon cancer screening   4. Menorrhagia with regular cycle   5. Obesity (BMI 35.0-39.9 without comorbidity)   6. Prediabetes   7. History of anemia      Plan:    - Blood tests: CBC with diff and Comprehensive metabolic  panel. TSH, A1C. Other labs reviewed.  - Breast self exam technique reviewed and patient encouraged to perform self-exam monthly. - Contraception: Oral birth control; refill given.  - Discussed healthy lifestyle modifications. - Mammogram completed 07/15/2021. - Pap smear up to date.  Continue routine screening every 3 years.   - Pre-diabetes, patient notes she has been working with a nutritionist since last December. Has lost 30 lbs.  Continued to encourage weight loss journey. Will need to recheck levels in 6-12 months from last level.  - COVID vaccination status: patient has completed vaccination series.  - Flu vaccine completed 06/26/2021 - History of anemia, likely secondary to h/o menorrhagia.  Currently managing with OCPs. Repeat CBC today.  - Discussed colon cancer screening, patient debating between colonoscopy and Cologuard. Given information for each. - Follow up in 1 year. For annual exam.     Hildred Laser, MD [facility]

## 2021-08-14 ENCOUNTER — Encounter: Payer: Self-pay | Admitting: Obstetrics and Gynecology

## 2021-08-16 LAB — CBC WITH DIFFERENTIAL/PLATELET
Basophils Absolute: 0.1 10*3/uL (ref 0.0–0.2)
Basos: 1 %
EOS (ABSOLUTE): 0.2 10*3/uL (ref 0.0–0.4)
Eos: 2 %
Hematocrit: 28.3 % — ABNORMAL LOW (ref 34.0–46.6)
Hemoglobin: 7.3 g/dL — ABNORMAL LOW (ref 11.1–15.9)
Immature Grans (Abs): 0 10*3/uL (ref 0.0–0.1)
Immature Granulocytes: 0 %
Lymphocytes Absolute: 1.9 10*3/uL (ref 0.7–3.1)
Lymphs: 28 %
MCH: 16.3 pg — ABNORMAL LOW (ref 26.6–33.0)
MCHC: 25.8 g/dL — ABNORMAL LOW (ref 31.5–35.7)
MCV: 63 fL — ABNORMAL LOW (ref 79–97)
Monocytes Absolute: 0.6 10*3/uL (ref 0.1–0.9)
Monocytes: 9 %
Neutrophils Absolute: 4 10*3/uL (ref 1.4–7.0)
Neutrophils: 60 %
Platelets: 346 10*3/uL (ref 150–450)
RBC: 4.49 x10E6/uL (ref 3.77–5.28)
RDW: 18.7 % — ABNORMAL HIGH (ref 11.7–15.4)
WBC: 6.7 10*3/uL (ref 3.4–10.8)

## 2021-08-16 LAB — COMPREHENSIVE METABOLIC PANEL
ALT: 16 IU/L (ref 0–32)
AST: 19 IU/L (ref 0–40)
Albumin/Globulin Ratio: 1.2 (ref 1.2–2.2)
Albumin: 4.2 g/dL (ref 3.8–4.8)
Alkaline Phosphatase: 71 IU/L (ref 44–121)
BUN/Creatinine Ratio: 11 (ref 9–23)
BUN: 10 mg/dL (ref 6–24)
Bilirubin Total: 0.3 mg/dL (ref 0.0–1.2)
CO2: 22 mmol/L (ref 20–29)
Calcium: 9.5 mg/dL (ref 8.7–10.2)
Chloride: 99 mmol/L (ref 96–106)
Creatinine, Ser: 0.94 mg/dL (ref 0.57–1.00)
Globulin, Total: 3.5 g/dL (ref 1.5–4.5)
Glucose: 117 mg/dL — ABNORMAL HIGH (ref 70–99)
Potassium: 4.6 mmol/L (ref 3.5–5.2)
Sodium: 136 mmol/L (ref 134–144)
Total Protein: 7.7 g/dL (ref 6.0–8.5)
eGFR: 74 mL/min/{1.73_m2} (ref >59)

## 2021-08-16 LAB — LIPID PANEL
Chol/HDL Ratio: 3.8 ratio (ref 0.0–4.4)
Cholesterol, Total: 155 mg/dL (ref 100–199)
HDL: 41 mg/dL (ref >39)
LDL Chol Calc (NIH): 75 mg/dL (ref 0–99)
Triglycerides: 240 mg/dL — ABNORMAL HIGH (ref 0–149)
VLDL Cholesterol Cal: 39 mg/dL (ref 5–40)

## 2021-08-16 LAB — HEMOGLOBIN A1C
Est. average glucose Bld gHb Est-mCnc: 120 mg/dL
Hgb A1c MFr Bld: 5.8 % — ABNORMAL HIGH (ref 4.8–5.6)

## 2021-08-18 ENCOUNTER — Telehealth: Payer: Self-pay

## 2021-08-18 NOTE — Telephone Encounter (Signed)
Pt called no answer LM via VM that I was calling to schedule her with [facility] for lab results. Pt was advised to please contact the office as soon as possible to schedule that appt.

## 2021-08-27 ENCOUNTER — Telehealth (INDEPENDENT_AMBULATORY_CARE_PROVIDER_SITE_OTHER): Payer: No Typology Code available for payment source | Admitting: Obstetrics and Gynecology

## 2021-08-27 VITALS — Ht 66.0 in

## 2021-08-27 DIAGNOSIS — R7303 Prediabetes: Secondary | ICD-10-CM | POA: Diagnosis not present

## 2021-08-27 DIAGNOSIS — Z862 Personal history of diseases of the blood and blood-forming organs and certain disorders involving the immune mechanism: Secondary | ICD-10-CM | POA: Diagnosis not present

## 2021-08-27 DIAGNOSIS — N920 Excessive and frequent menstruation with regular cycle: Secondary | ICD-10-CM

## 2021-08-27 DIAGNOSIS — R899 Unspecified abnormal finding in specimens from other organs, systems and tissues: Secondary | ICD-10-CM | POA: Diagnosis not present

## 2021-08-27 DIAGNOSIS — E781 Pure hyperglyceridemia: Secondary | ICD-10-CM

## 2021-08-27 NOTE — Progress Notes (Signed)
GYNECOLOGY PROGRESS TELEVISIT NOTE  Subjective:    Patient ID: JERA HEADINGS, female    DOB: Jun 20, 1971, 50 y.o.   MRN: [id number]  I connected with  [name] on 08/27/21 by a video enabled telemedicine application and verified that I am speaking with the correct person using two identifiers.   I discussed the limitations of evaluation and management by telemedicine. The patient expressed understanding and agreed to proceed.   HPI  Patient is a 50 y.o. G26P1001 female who presents to discuss labs. Denies complaints today.   The following portions of the patient's history were reviewed and updated as appropriate: allergies, current medications, past family history, past medical history, past social history, past surgical history, and problem list.  Review of Systems A comprehensive review of systems was negative.   Objective:  Height 5' 6"  (1.676 m), last menstrual period 08/21/2021.  General appearance: alert and no distress Psych: Normal speech, normal thoughts.  Head: NCAT   Labs:  Office Visit on 08/13/2021  Component Date Value Ref Range Status   Glucose 08/13/2021 117 (A) 70 - 99 mg/dL Final                 **Please note reference interval change**   BUN 08/13/2021 10  6 - 24 mg/dL Final   Creatinine, Ser 08/13/2021 0.94  0.57 - 1.00 mg/dL Final   eGFR 08/13/2021 74  >59 mL/min/1.73 Final   BUN/Creatinine Ratio 08/13/2021 11  9 - 23 Final   Sodium 08/13/2021 136  134 - 144 mmol/L Final   Potassium 08/13/2021 4.6  3.5 - 5.2 mmol/L Final   Chloride 08/13/2021 99  96 - 106 mmol/L Final   CO2 08/13/2021 22  20 - 29 mmol/L Final   Calcium 08/13/2021 9.5  8.7 - 10.2 mg/dL Final   Total Protein 08/13/2021 7.7  6.0 - 8.5 g/dL Final   Albumin 08/13/2021 4.2  3.8 - 4.8 g/dL Final   Globulin, Total 08/13/2021 3.5  1.5 - 4.5 g/dL Final   Albumin/Globulin Ratio 08/13/2021 1.2  1.2 - 2.2 Final   Bilirubin Total 08/13/2021 0.3  0.0 - 1.2 mg/dL Final   Alkaline  Phosphatase 08/13/2021 71  44 - 121 IU/L Final   AST 08/13/2021 19  0 - 40 IU/L Final   ALT 08/13/2021 16  0 - 32 IU/L Final   WBC 08/13/2021 6.7  3.4 - 10.8 x10E3/uL Final   RBC 08/13/2021 4.49  3.77 - 5.28 x10E6/uL Final   Hemoglobin 08/13/2021 7.3 (A) 11.1 - 15.9 g/dL Final   Hematocrit 08/13/2021 28.3 (A) 34.0 - 46.6 % Final   MCV 08/13/2021 63 (A) 79 - 97 fL Final   MCH 08/13/2021 16.3 (A) 26.6 - 33.0 pg Final   MCHC 08/13/2021 25.8 (A) 31.5 - 35.7 g/dL Final   RDW 08/13/2021 18.7 (A) 11.7 - 15.4 % Final   Platelets 08/13/2021 346  150 - 450 x10E3/uL Final   Neutrophils 08/13/2021 60  Not Estab. % Final   Lymphs 08/13/2021 28  Not Estab. % Final   Monocytes 08/13/2021 9  Not Estab. % Final   Eos 08/13/2021 2  Not Estab. % Final   Basos 08/13/2021 1  Not Estab. % Final   Neutrophils Absolute 08/13/2021 4.0  1.4 - 7.0 x10E3/uL Final   Lymphocytes Absolute 08/13/2021 1.9  0.7 - 3.1 x10E3/uL Final   Monocytes Absolute 08/13/2021 0.6  0.1 - 0.9 x10E3/uL Final   EOS (ABSOLUTE) 08/13/2021 0.2  0.0 -  0.4 x10E3/uL Final   Basophils Absolute 08/13/2021 0.1  0.0 - 0.2 x10E3/uL Final   Immature Granulocytes 08/13/2021 0  Not Estab. % Final   Immature Grans (Abs) 08/13/2021 0.0  0.0 - 0.1 x10E3/uL Final   Hgb A1c MFr Bld 08/13/2021 5.8 (A) 4.8 - 5.6 % Final   Comment:          Prediabetes: 5.7 - 6.4          Diabetes: >6.4          Glycemic control for adults with diabetes: <7.0    Est. average glucose Bld gHb Est-m* 08/13/2021 120  mg/dL Final   Cholesterol, Total 08/13/2021 155  100 - 199 mg/dL Final   Triglycerides 08/13/2021 240 (A) 0 - 149 mg/dL Final   HDL 08/13/2021 41  >39 mg/dL Final   VLDL Cholesterol Cal 08/13/2021 39  5 - 40 mg/dL Final   LDL Chol Calc (NIH) 08/13/2021 75  0 - 99 mg/dL Final   Chol/HDL Ratio 08/13/2021 3.8  0.0 - 4.4 ratio Final   Comment:                                   T. Chol/HDL Ratio                                             Men  Women                                1/2 Avg.Risk  3.4    3.3                                   Avg.Risk  5.0    4.4                                2X Avg.Risk  9.6    7.1                                3X Avg.Risk 23.4   11.0      Assessment:   1. Abnormal laboratory test result   2. Prediabetes   3. History of anemia   4. Menorrhagia with regular cycle   5. High triglycerides      Plan:   Prediabetes - patient notes she has known that she was prediabetic, has had the same lab value for almost 20 years. Is just maintaining with diet and physical activity.  Discussed option of medication (Metformin) to improve levels but declines at this time.  History of anemia - patient notes that she has had anemia for some time. Is likely due to her cycles (notes moderate flow but cycles last for 7 days, occur monthly). Currently on combined OCPs. Has had an IUD in the past however still had heavy bleeding. Has been offered a hysterectomy in the past but has declined. Discussed other options including progesterone-only methods (Slynd), or surgical option of endometrial ablation. Patient is interested in ablation. Will give info to review. Is taking oral iron around the time of  her cycle. Advised that she needs to take it daily every month for at least 3 months. . If no improvement in her symptoms.  Also will give list of iron-rich foods. She does have a h/o gastric bypass and may have issues with absorption. If no improvement, can consider referral to Hematology for iron infusions. Is currently otherwise asymptomatic.  Elevated triglycerides - notes that this is new for her.  Discussed OTC supplements, continue to encourage lifestyle modifications (diet and exercise). WIll continue to monitor.    Total face-to-face encounter time: 15 min 3 sec.   Rubie Maid, MD [facility]

## 2021-08-28 ENCOUNTER — Encounter: Payer: Self-pay | Admitting: Obstetrics and Gynecology

## 2021-09-17 ENCOUNTER — Other Ambulatory Visit: Payer: Self-pay

## 2021-09-17 DIAGNOSIS — Z1211 Encounter for screening for malignant neoplasm of colon: Secondary | ICD-10-CM

## 2021-10-01 ENCOUNTER — Other Ambulatory Visit: Payer: Self-pay

## 2021-10-01 ENCOUNTER — Other Ambulatory Visit: Payer: Self-pay | Admitting: Gastroenterology

## 2021-10-01 DIAGNOSIS — K219 Gastro-esophageal reflux disease without esophagitis: Secondary | ICD-10-CM | POA: Insufficient documentation

## 2021-10-01 DIAGNOSIS — Z1211 Encounter for screening for malignant neoplasm of colon: Secondary | ICD-10-CM

## 2021-10-01 DIAGNOSIS — J45909 Unspecified asthma, uncomplicated: Secondary | ICD-10-CM | POA: Insufficient documentation

## 2021-10-01 DIAGNOSIS — D649 Anemia, unspecified: Secondary | ICD-10-CM | POA: Insufficient documentation

## 2021-10-01 MED ORDER — NA SULFATE-K SULFATE-MG SULF 17.5-3.13-1.6 GM/177ML PO SOLN: 1.0000 | Freq: Once | ORAL | 0 refills | Status: DC

## 2021-10-18 ENCOUNTER — Encounter: Payer: No Typology Code available for payment source | Admitting: Obstetrics and Gynecology

## 2021-10-25 ENCOUNTER — Telehealth: Payer: Self-pay

## 2021-10-25 NOTE — Telephone Encounter (Signed)
Patient will call back to reschedule procedure.

## 2021-10-26 ENCOUNTER — Telehealth: Payer: Self-pay

## 2021-10-26 NOTE — Telephone Encounter (Signed)
Returned patients call. Patient would like to wait until february to schedule procedure. Pt will contact office when ready to schedule. Endo unit has been notified of change.

## 2021-10-28 ENCOUNTER — Ambulatory Visit
Admission: RE | Admit: 2021-10-28 | Payer: No Typology Code available for payment source | Source: Home / Self Care | Admitting: Gastroenterology

## 2021-10-28 ENCOUNTER — Encounter: Admission: RE | Payer: Self-pay | Source: Home / Self Care

## 2021-10-28 SURGERY — COLONOSCOPY WITH PROPOFOL
Anesthesia: General

## 2021-12-03 ENCOUNTER — Encounter: Payer: Self-pay | Admitting: Obstetrics and Gynecology

## 2021-12-21 ENCOUNTER — Encounter: Payer: No Typology Code available for payment source | Admitting: Obstetrics and Gynecology

## 2021-12-22 ENCOUNTER — Other Ambulatory Visit: Payer: Self-pay

## 2021-12-22 ENCOUNTER — Encounter: Payer: Self-pay | Admitting: Obstetrics and Gynecology

## 2021-12-22 ENCOUNTER — Ambulatory Visit (INDEPENDENT_AMBULATORY_CARE_PROVIDER_SITE_OTHER): Payer: 59 | Admitting: Obstetrics and Gynecology

## 2021-12-22 VITALS — BP 139/68 | HR 95 | Ht 66.0 in | Wt 234.0 lb

## 2021-12-22 DIAGNOSIS — N924 Excessive bleeding in the premenopausal period: Secondary | ICD-10-CM

## 2021-12-22 DIAGNOSIS — Z01818 Encounter for other preprocedural examination: Secondary | ICD-10-CM | POA: Diagnosis not present

## 2021-12-22 DIAGNOSIS — N920 Excessive and frequent menstruation with regular cycle: Secondary | ICD-10-CM

## 2021-12-22 NOTE — Patient Instructions (Addendum)
GYNECOLOGY PRE-OPERATIVE INSTRUCTIONS  You are scheduled for surgery on 01/24/2022.  The name of your procedure is: Hysteroscopy D&C with endometrial ablation.   Please read through these instructions carefully regarding preparation for your surgery: Nothing to eat after midnight on the day prior to surgery.  Do not take any medications unless recommended by your provider on day prior to surgery.  Do not take NSAIDs (Motrin, Aleve) or aspirin 7 days prior to surgery.  You may take Tylenol products for minor aches and pains.  You will receive a prescription for pain medications post-operatively.  You will be contacted by phone approximately 1-2 weeks prior to surgery to schedule your pre-operative appointment.  If you are being admitted to the hospital for an overnight stay, you will require COVID testing prior to surgery. Instructions will be given to you on when and where to have this performed.  Please call the office if you have any questions regarding your upcoming surgery.    Thank you for choosing [facility].   Endometrial Ablation, Care After The following information offers guidance on how to care for yourself after your procedure. Your health care provider may also give you more specific instructions. If you have problems or questions, contact your health care provider. What can I expect after the procedure? After the procedure, it is common to have: A need to urinate more often than usual for the first 24 hours. Cramps that feel like menstrual cramps. These may last for 1-2 days. A thin, watery vaginal discharge that is light pink or brown. This may last for a few weeks. Discharge will be heavy for the first few days after your procedure. You may need to wear a sanitary pad. Nausea. Vaginal bleeding for 4-6 weeks after the procedure, as tissue healing occurs. Follow these instructions at home: Medicines  Take over-the-counter and prescription medicines only as told by  your health care provider. If you were prescribed an antibiotic medicine, take it as told by your health care provider. Do not stop taking the antibiotic even if you start to feel better. Ask your health care provider if the medicine prescribed to you: Requires you to avoid driving or using machinery. Can cause constipation. You may need to take these actions to prevent or treat constipation: Drink enough fluid to keep your urine pale yellow. Take over-the-counter or prescription medicines. Eat foods that are high in fiber, such as beans, whole grains, and fresh fruits and vegetables. Limit foods that are high in fat and processed sugars, such as fried or sweet foods. Activity If you were given a sedative during the procedure, it can affect you for several hours. Do not drive or operate machinery until your health care provider says that it is safe. Do not have sex or put anything into your vagina until your health care provider says that it is safe. Do not lift anything that is heavier than 5 lb (2.3 kg), or the limit that you are told, until your health care provider says that it is safe. Return to your normal activities as told by your health care provider. Ask your health care provider what activities are safe for you. General instructions Do not take baths, swim, or use a hot tub until your health care provider says that it is safe. You will be able to take showers. Check your vaginal area every day for signs of infection. Check for: Redness, swelling, or more pain. More blood coming from your vagina. A bad-smelling discharge. Keep  all follow-up visits. This is important. Contact a health care provider if: You have vaginal redness, swelling, or more pain. You have discharge or bleeding from your vagina that is getting worse. You have a bad-smelling vaginal discharge. You have a fever or chills. You have trouble urinating. Get help right away if: You have heavy, bright red vaginal  bleeding that may include blood clots. You have severe cramps that do not get better with medicine. Summary After endometrial ablation, it is normal to have a thin, watery vaginal discharge that is light pink or brown. This may last a few weeks and may be heavier right after the procedure. Vaginal bleeding is common after the procedure and should get better with time. Check your vaginal area every day for signs of infection, such as a bad-smelling discharge. Keep all follow-up visits. This is important. This information is not intended to replace advice given to you by your health care provider. Make sure you discuss any questions you have with your health care provider. Document Revised: 05/21/2020 Document Reviewed: 05/21/2020 Elsevier Patient Education  2022 [employer].

## 2021-12-22 NOTE — Progress Notes (Signed)
° ° °  GYNECOLOGY PROGRESS NOTE  Subjective:    Patient ID: Frances Rodgers, female    DOB: 22-Dec-1970, 51 y.o.   MRN: [id number]  HPI  Patient is a 51 y.o. G31P1001 female who presents for discussion of surgery (endometrial ablation) for perimenopausal menorrhagia. Has history of anemia secondary to blood loss.   The following portions of the patient's history were reviewed and updated as appropriate: allergies, current medications, past family history, past medical history, past social history, past surgical history, and problem list.  Review of Systems Pertinent items noted in HPI and remainder of comprehensive ROS otherwise negative.   Objective:   Blood pressure 139/68, pulse 95, height 5\' 6"  (1.676 m), weight 234 lb (106.1 kg), last menstrual period 12/09/2021, SpO2 95 %.  Body mass index is 37.77 kg/m. General appearance: alert and no distress Remainder of exam deferred. See H&P.    Assessment:    Perimenopausal menorrhagia  History of anemia  Plan:  Patient desires surgical management with endometrial ablation.  The risks of surgery were discussed in detail with the patient including but not limited to: bleeding which may require transfusion or reoperation; infection which may require hospitalization or rand antibiotic therapy; injury to bowel, bladder, ureters and major vessels or other surrounding organs which may lead to other procedures; need for additional procedures including laparotomy or subsequent procedures secondary to intraoperative injury or abnormal pathology; thromboembolic phenomenon; incisional problems and other postoperative or anesthesia complications.  Patient was told that the likelihood that her condition and symptoms will be treated effectively with this surgical management was very high; the postoperative expectations were also discussed in detail. The patient also understands the alternative treatment options which were discussed in full. All questions were  answered.  She was told that she will be contacted regarding the time and date of her surgery; routine preoperative instructions will be given to her by the preoperative nursing team.  Printed patient education handouts about the procedure were given to the patient to review at home.   A total of 15 minutes were spent face-to-face with the patient during this encounter and over half of that time dealt with counseling and coordination of care.    12/11/2021, MD [facility]

## 2021-12-23 NOTE — H&P (Addendum)
GYNECOLOGY PREOPERATIVE HISTORY AND PHYSICAL   Subjective:  Frances Rodgers is a 51 y.o. G1P1001 here for surgical management of perimenopausal menorrhagia. Patient has been experiencing heavier menstrual cycles for the past several years.  No significant preoperative concerns.   Proposed surgery: Hysteroscopy D&C with Minerva endometrial ablation   Pertinent Gynecological History: Patient's last menstrual period was 12/09/2021. Period Cycle (Days): 30 Period Pattern: Regular Menstrual Flow: Heavy, Light Menstrual Control: Maxi pad Menstrual Control Change Freq (Hours): 2-3 years  Contraception: OCPs Last mammogram: normal Date: 07/24/2021 Last pap: normal Date: 05/09/2019   Past Medical History:  Diagnosis Date   Asthma    GERD (gastroesophageal reflux disease)    Seasonal allergies     Past Surgical History:  Procedure Laterality Date   GASTRIC BYPASS  2015   NASAL POLYP SURGERY  2010   WISDOM TOOTH EXTRACTION      OB History  Gravida Para Term Preterm AB Living  1 1 1     1   SAB IAB Ectopic Multiple Live Births          1    # Outcome Date GA Lbr Len/2nd Weight Sex Delivery Anes PTL Lv  1 Term 04/09/95    F Vag-Spont   LIV    Family History  Problem Relation Age of Onset   Diabetes Mother    Diabetes Brother    Pancreatic cancer Maternal Grandmother     Social History   Socioeconomic History   Marital status: Single    Spouse name: Not on file   Number of children: Not on file   Years of education: Not on file   Highest education level: Not on file  Occupational History   Not on file  Tobacco Use   Smoking status: Never   Smokeless tobacco: Current  Vaping Use   Vaping Use: Never used  Substance and Sexual Activity   Alcohol use: Never   Drug use: Never   Sexual activity: Not Currently    Birth control/protection: None  Other Topics Concern   Not on file  Social History Narrative   Not on file   Social Determinants of Health    Financial Resource Strain: Not on file  Food Insecurity: Not on file  Transportation Needs: Not on file  Physical Activity: Not on file  Stress: Not on file  Social Connections: Not on file  Intimate Partner Violence: Not on file    Current Outpatient Medications on File Prior to Visit  Medication Sig Dispense Refill   albuterol (VENTOLIN HFA) 108 (90 Base) MCG/ACT inhaler Inhale into the lungs every 6 (six) hours as needed for wheezing or shortness of breath.     Azelastine HCl 137 MCG/SPRAY SOLN      Cetirizine HCl 10 MG CAPS Take by mouth.     EPINEPHrine 0.3 mg/0.3 mL IJ SOAJ injection SMARTSIG:0.3 Milliliter(s) IM Once PRN     ipratropium (ATROVENT) 0.06 % nasal spray SMARTSIG:1-2 Spray(s) Both Nares 2-3 Times Daily     JUNEL FE 24 1-20 MG-MCG(24) tablet TAKE 1 TABLET BY MOUTH  DAILY 84 tablet 3   montelukast (SINGULAIR) 10 MG tablet Take by mouth.     Multiple Vitamin (MULTI VITAMIN DAILY PO) Take by mouth.     pantoprazole (PROTONIX) 40 MG tablet ONE TABLE 30 MINUTES PRIOR TO FIRST MEAL OF DAY     No current facility-administered medications on file prior to visit.   Allergies  Allergen Reactions   Aspirin Anaphylaxis  and Shortness Of Breath   Ketorolac Anaphylaxis   Ketorolac Tromethamine Shortness Of Breath     Review of Systems Constitutional: No recent fever/chills/sweats Respiratory: No recent cough/bronchitis Cardiovascular: No chest pain Gastrointestinal: No recent nausea/vomiting/diarrhea Genitourinary: No UTI symptoms Hematologic/lymphatic:No history of coagulopathy or recent blood thinner use    Objective:   Blood pressure 139/68, pulse 95, height 5\' 6"  (1.676 m), weight 234 lb (106.1 kg), last menstrual period 12/09/2021, SpO2 95 %. CONSTITUTIONAL: Well-developed, well-nourished female in no acute distress.  HENT:  Normocephalic, atraumatic, External right and left ear normal. Oropharynx is clear and moist EYES: Conjunctivae and EOM are normal. Pupils  are equal, round, and reactive to light. No scleral icterus.  NECK: Normal range of motion, supple, no masses SKIN: Skin is warm and dry. No rash noted. Not diaphoretic. No erythema. No pallor. NEUROLOGIC: Alert and oriented to person, place, and time. Normal reflexes, muscle tone coordination. No cranial nerve deficit noted. PSYCHIATRIC: Normal mood and affect. Normal behavior. Normal judgment and thought content. CARDIOVASCULAR: Normal heart rate noted, regular rhythm RESPIRATORY: Effort and breath sounds normal, no problems with respiration noted ABDOMEN: Soft, nontender, nondistended. PELVIC: Deferred MUSCULOSKELETAL: Normal range of motion. No edema and no tenderness. 2+ distal pulses.    Labs: Lab Results  Component Value Date   WBC 6.7 08/13/2021   HGB 7.3 (L) 08/13/2021   HCT 28.3 (L) 08/13/2021   MCV 63 (L) 08/13/2021   PLT 346 08/13/2021      Imaging Studies: No results found.  Assessment:    Perimenopausal menorrhagia History of anemia  Plan:   Counseling: Procedure, risks, reasons, benefits and complications (including injury to bowel, bladder, major blood vessel, ureter, bleeding, possibility of transfusion, infection, or fistula formation) reviewed in detail. Likelihood of success in alleviating the patient's condition was discussed. Has been counseled on alternative management options. Routine postoperative instructions will be reviewed with the patient and her family in detail after surgery.  The patient concurred with the proposed plan, giving informed written consent for the surgery.   Preop testing ordered. Instructions reviewed, including NPO after midnight.    08/15/2021, MD [facility]

## 2022-01-14 ENCOUNTER — Inpatient Hospital Stay: Admission: RE | Admit: 2022-01-14 | Payer: No Typology Code available for payment source | Source: Ambulatory Visit

## 2022-01-19 ENCOUNTER — Telehealth: Payer: Self-pay | Admitting: Obstetrics and Gynecology

## 2022-01-19 ENCOUNTER — Inpatient Hospital Stay: Admission: RE | Admit: 2022-01-19 | Payer: No Typology Code available for payment source | Source: Ambulatory Visit

## 2022-01-19 NOTE — Telephone Encounter (Signed)
Pt called this morning @ 8:23- stating that she would like to cancel surgery that is scheduled for 3-13 due to a serious family matter- pt stated that she does not wish to reschedule at this time and that she will be in touch with office. - made Dr.Cherry and Mena Goes aware via secure chat  ?

## 2022-01-24 ENCOUNTER — Encounter: Admission: RE | Payer: Self-pay | Source: Home / Self Care

## 2022-01-24 ENCOUNTER — Ambulatory Visit
Admission: RE | Admit: 2022-01-24 | Payer: No Typology Code available for payment source | Source: Home / Self Care | Admitting: Obstetrics and Gynecology

## 2022-01-24 SURGERY — DILATATION AND CURETTAGE/HYSTEROSCOPY WITH MINERVA
Anesthesia: General

## 2022-04-09 ENCOUNTER — Other Ambulatory Visit: Payer: Self-pay | Admitting: Obstetrics and Gynecology

## 2022-09-29 ENCOUNTER — Ambulatory Visit: Payer: Self-pay | Admitting: Obstetrics and Gynecology

## 2022-09-30 ENCOUNTER — Other Ambulatory Visit (HOSPITAL_COMMUNITY)
Admission: RE | Admit: 2022-09-30 | Discharge: 2022-09-30 | Disposition: A | Discharge status: Home / Self Care | Payer: 59 | Source: Ambulatory Visit | Attending: Obstetrics and Gynecology | Admitting: Obstetrics and Gynecology

## 2022-09-30 ENCOUNTER — Ambulatory Visit (INDEPENDENT_AMBULATORY_CARE_PROVIDER_SITE_OTHER): Payer: 59 | Admitting: Obstetrics and Gynecology

## 2022-09-30 ENCOUNTER — Encounter: Payer: Self-pay | Admitting: Obstetrics and Gynecology

## 2022-09-30 VITALS — BP 127/56 | HR 111 | Resp 16 | Ht 66.0 in | Wt 218.4 lb

## 2022-09-30 DIAGNOSIS — Z131 Encounter for screening for diabetes mellitus: Secondary | ICD-10-CM

## 2022-09-30 DIAGNOSIS — Z1211 Encounter for screening for malignant neoplasm of colon: Secondary | ICD-10-CM

## 2022-09-30 DIAGNOSIS — Z13220 Encounter for screening for lipoid disorders: Secondary | ICD-10-CM

## 2022-09-30 DIAGNOSIS — Z124 Encounter for screening for malignant neoplasm of cervix: Secondary | ICD-10-CM

## 2022-09-30 DIAGNOSIS — Z862 Personal history of diseases of the blood and blood-forming organs and certain disorders involving the immune mechanism: Secondary | ICD-10-CM

## 2022-09-30 DIAGNOSIS — Z01419 Encounter for gynecological examination (general) (routine) without abnormal findings: Secondary | ICD-10-CM | POA: Diagnosis present

## 2022-09-30 DIAGNOSIS — Z1231 Encounter for screening mammogram for malignant neoplasm of breast: Secondary | ICD-10-CM

## 2022-09-30 NOTE — Progress Notes (Signed)
ANNUAL PREVENTATIVE CARE GYNECOLOGY  ENCOUNTER NOTE  Subjective:       Frances Rodgers is a 51 y.o. G43P1001 female here for a routine annual gynecologic exam. The patient is not currently sexually active.  The patient wears seatbelts: yes. The patient participates in regular exercise: 30 minutes of walking daily. Has the patient ever been transfused or tattooed?: yes.   Current complaints: 1.  No new concerns.    Gynecologic History Contraception: oral birth control History of STI's: Denies Last Pap: 05/09/2019. Results were: normal.  Denies h/o abnormal pap smears. Last mammogram: ~ 07/15/2021 (performed at [facility] in [place]). Results were: normal Colon screening: never had one   Obstetric History OB History  Gravida Para Term Preterm AB Living  1 1 1     1   SAB IAB Ectopic Multiple Live Births          1    # Outcome Date GA Lbr Len/2nd Weight Sex Delivery Anes PTL Lv  1 Term 04/09/95    F Vag-Spont   LIV    Past Medical History:  Diagnosis Date   Asthma    GERD (gastroesophageal reflux disease)    Seasonal allergies     Family History  Problem Relation Age of Onset   Diabetes Mother    Diabetes Brother    Pancreatic cancer Maternal Grandmother     Past Surgical History:  Procedure Laterality Date   GASTRIC BYPASS  2015   NASAL POLYP SURGERY  2010   WISDOM TOOTH EXTRACTION      Social History   Socioeconomic History   Marital status: Single    Spouse name: Not on file   Number of children: Not on file   Years of education: Not on file   Highest education level: Not on file  Occupational History   Not on file  Tobacco Use   Smoking status: Never   Smokeless tobacco: Current  Vaping Use   Vaping Use: Never used  Substance and Sexual Activity   Alcohol use: Never   Drug use: Never   Sexual activity: Not Currently    Birth control/protection: None  Other Topics Concern   Not on file  Social History Narrative   Not on file    Social Determinants of Health   Financial Resource Strain: Not on file  Food Insecurity: Not on file  Transportation Needs: Not on file  Physical Activity: Not on file  Stress: Not on file  Social Connections: Not on file  Intimate Partner Violence: Not on file    Current Outpatient Medications on File Prior to Visit  Medication Sig Dispense Refill   albuterol (VENTOLIN HFA) 108 (90 Base) MCG/ACT inhaler Inhale into the lungs every 6 (six) hours as needed for wheezing or shortness of breath.     Azelastine HCl 137 MCG/SPRAY SOLN      Cetirizine HCl 10 MG CAPS Take by mouth.     EPINEPHrine 0.3 mg/0.3 mL IJ SOAJ injection SMARTSIG:0.3 Milliliter(s) IM Once PRN     ipratropium (ATROVENT) 0.06 % nasal spray SMARTSIG:1-2 Spray(s) Both Nares 2-3 Times Daily     JUNEL FE 24 1-20 MG-MCG(24) tablet TAKE 1 TABLET BY MOUTH  DAILY 84 tablet 3   montelukast (SINGULAIR) 10 MG tablet Take by mouth.     Multiple Vitamin (MULTI VITAMIN DAILY PO) Take by mouth.     pantoprazole (PROTONIX) 40 MG tablet ONE TABLE 30 MINUTES PRIOR TO FIRST MEAL OF DAY  No current facility-administered medications on file prior to visit.    Allergies  Allergen Reactions   Aspirin Anaphylaxis and Shortness Of Breath   Ketorolac Anaphylaxis   Ketorolac Tromethamine Shortness Of Breath     Review of Systems ROS Review of Systems - General ROS: negative for - chills, fatigue, fever, hot flashes, night sweats, weight gain or weight loss Psychological ROS: negative for - anxiety, decreased libido, depression, mood swings, physical abuse or sexual abuse Ophthalmic ROS: negative for - blurry vision, eye pain or loss of vision ENT ROS: negative for - headaches, hearing change, visual changes or vocal changes Allergy and Immunology ROS: negative for - hives, itchy/watery eyes or seasonal allergies Hematological and Lymphatic ROS: negative for - bleeding problems, bruising, swollen lymph nodes or weight  loss Endocrine ROS: negative for - galactorrhea, hair pattern changes, hot flashes, malaise/lethargy, mood swings, palpitations, polydipsia/polyuria, skin changes, temperature intolerance or unexpected weight changes Breast ROS: negative for - new or changing breast lumps or nipple discharge Respiratory ROS: negative for - cough or shortness of breath Cardiovascular ROS: negative for - chest pain, irregular heartbeat, palpitations or shortness of breath Gastrointestinal ROS: no abdominal pain, change in bowel habits, or black or bloody stools Genito-Urinary ROS: no dysuria, trouble voiding, or hematuria Musculoskeletal ROS: negative for - joint pain or joint stiffness Neurological ROS: negative for - bowel and bladder control changes Dermatological ROS: negative for rash and skin lesion changes   Objective:   BP (!) 127/56   Pulse (!) 111   Resp 16   Ht 5\' 6"  (1.676 m)   Wt 218 lb 6.4 oz (99.1 kg)   LMP 09/15/2022 (Exact Date)   BMI 35.25 kg/m . CONSTITUTIONAL: Well-developed, well-nourished female in no acute distress.  PSYCHIATRIC: Normal mood and affect. Normal behavior. Normal judgment and thought content. NEUROLGIC: Alert and oriented to person, place, and time. Normal muscle tone coordination. No cranial nerve deficit noted. HENT:  Normocephalic, atraumatic, External right and left ear normal. Oropharynx is clear and moist EYES: Conjunctivae and EOM are normal. Pupils are equal, round, and reactive to light. No scleral icterus.  NECK: Normal range of motion, supple, no masses.  Normal thyroid.  SKIN: Skin is warm and dry. No rash noted. Not diaphoretic. No erythema. No pallor. CARDIOVASCULAR: Normal heart rate noted, regular rhythm, no murmur. RESPIRATORY: Clear to auscultation bilaterally. Effort and breath sounds normal, no problems with respiration noted. BREASTS: Symmetric in size. No masses, skin changes, nipple drainage, or lymphadenopathy. ABDOMEN: Soft, normal bowel  sounds, no distention noted.  No tenderness, rebound or guarding.  BLADDER: Normal PELVIC:  Bladder no bladder distension noted  Urethra: normal appearing urethra with no masses, tenderness or lesions  Vulva: normal appearing vulva with no masses, tenderness or lesions  Vagina: normal appearing vagina with normal color and discharge, no lesions  Cervix: normal appearing cervix without discharge or lesions  Uterus: uterus difficult to palpate due to body habitus bur feels enlarged. Is normal size, shape, consistency and nontender  Adnexa: normal adnexa in size, nontender and no masses  RV: External Exam NormaI, No Rectal Masses, and Normal Sphincter tone  MUSCULOSKELETAL: Normal range of motion. No tenderness.  No cyanosis, clubbing, or edema.  2+ distal pulses. LYMPHATIC: No Axillary, Supraclavicular, or Inguinal Adenopathy.   Labs: Lab Results  Component Value Date   WBC 6.7 08/13/2021   HGB 7.3 (L) 08/13/2021   HCT 28.3 (L) 08/13/2021   MCV 63 (L) 08/13/2021   PLT 346 08/13/2021  Lab Results  Component Value Date   CREATININE 0.94 08/13/2021   BUN 10 08/13/2021   NA 136 08/13/2021   K 4.6 08/13/2021   CL 99 08/13/2021   CO2 22 08/13/2021    Lab Results  Component Value Date   ALT 16 08/13/2021   AST 19 08/13/2021   ALKPHOS 71 08/13/2021   BILITOT 0.3 08/13/2021    Lab Results  Component Value Date   CHOL 155 08/13/2021   HDL 41 08/13/2021   LDLCALC 75 08/13/2021   TRIG 240 (H) 08/13/2021   CHOLHDL 3.8 08/13/2021    Lab Results  Component Value Date   TSH 4.010 03/16/2020    Lab Results  Component Value Date   HGBA1C 5.8 (H) 08/13/2021     Assessment:   1. Encounter for well woman exam with routine gynecological exam   2. Cervical cancer screening   3. Encounter for screening mammogram for malignant neoplasm of breast   4. Screening for diabetes mellitus (DM)   5. Screening cholesterol level   6. Colon cancer screening   7. History of anemia       Plan:  Pap: Pap, Reflex if ASCUS Mammogram: Ordered Colon Screening:  Notes she was denied last year, but should be able to be covered this year if it was due to age at screening. If still unable to schedule, will order Cologuard.  Labs: Lipid 1, TSH, Hemoglobin A1C, and Vit D Level""CBC, CMET, Vitamin D Routine preventative health maintenance measures emphasized: Exercise/Diet/Weight control, Tobacco Warnings, Alcohol/Substance use risks, Stress Management, Peer Pressure Issues, and Safe Sex COVID Vaccination status: Has completed 2 dose Pfizer series, eligible for booster Flu vaccine: Received in August.  History of anemia, reports cycles have always been heavy, taking iron. Will recheck levels.  Return to Clinic - 1 Year   Hildred Laser, MD [facility]

## 2022-10-01 ENCOUNTER — Encounter: Payer: Self-pay | Admitting: Obstetrics

## 2022-10-01 ENCOUNTER — Other Ambulatory Visit: Payer: Self-pay | Admitting: Obstetrics

## 2022-10-01 DIAGNOSIS — D649 Anemia, unspecified: Secondary | ICD-10-CM

## 2022-10-01 NOTE — Progress Notes (Signed)
Hgb 5.4  - Critical lab results called from [facility], confirmed via fax. Attempted to contact Frances Rodgers via phone, mailbox full. MyChart message sent recommending blood transfusion. Referral placed to hematology.  M. Chryl Heck, CNM

## 2022-10-03 LAB — COMPREHENSIVE METABOLIC PANEL
ALT: 15 IU/L (ref 0–32)
AST: 11 IU/L (ref 0–40)
Albumin/Globulin Ratio: 1.3 (ref 1.2–2.2)
Albumin: 3.9 g/dL (ref 3.9–4.9)
Alkaline Phosphatase: 57 IU/L (ref 44–121)
BUN/Creatinine Ratio: 14 (ref 9–23)
BUN: 12 mg/dL (ref 6–24)
Bilirubin Total: 0.3 mg/dL (ref 0.0–1.2)
CO2: 19 mmol/L — ABNORMAL LOW (ref 20–29)
Calcium: 9.2 mg/dL (ref 8.7–10.2)
Chloride: 102 mmol/L (ref 96–106)
Creatinine, Ser: 0.88 mg/dL (ref 0.57–1.00)
Globulin, Total: 3.1 g/dL (ref 1.5–4.5)
Glucose: 128 mg/dL — ABNORMAL HIGH (ref 70–99)
Potassium: 4 mmol/L (ref 3.5–5.2)
Sodium: 136 mmol/L (ref 134–144)
Total Protein: 7 g/dL (ref 6.0–8.5)
eGFR: 80 mL/min/{1.73_m2} (ref >59)

## 2022-10-03 LAB — LIPID PANEL
Chol/HDL Ratio: 4 ratio (ref 0.0–4.4)
Cholesterol, Total: 147 mg/dL (ref 100–199)
HDL: 37 mg/dL — ABNORMAL LOW (ref >39)
LDL Chol Calc (NIH): 65 mg/dL (ref 0–99)
Triglycerides: 281 mg/dL — ABNORMAL HIGH (ref 0–149)
VLDL Cholesterol Cal: 45 mg/dL — ABNORMAL HIGH (ref 5–40)

## 2022-10-03 LAB — CBC
Hematocrit: 20.4 % — ABNORMAL LOW (ref 34.0–46.6)
Hemoglobin: 5.4 g/dL — CL (ref 11.1–15.9)
MCH: 17.2 pg — ABNORMAL LOW (ref 26.6–33.0)
MCHC: 26.5 g/dL — ABNORMAL LOW (ref 31.5–35.7)
MCV: 65 fL — ABNORMAL LOW (ref 79–97)
Platelets: 507 10*3/uL — ABNORMAL HIGH (ref 150–450)
RBC: 3.14 x10E6/uL — ABNORMAL LOW (ref 3.77–5.28)
RDW: 25.1 % — ABNORMAL HIGH (ref 11.7–15.4)
WBC: 7.9 10*3/uL (ref 3.4–10.8)

## 2022-10-03 LAB — HEMOGLOBIN A1C
Est. average glucose Bld gHb Est-mCnc: 100 mg/dL
Hgb A1c MFr Bld: 5.1 % (ref 4.8–5.6)

## 2022-10-04 ENCOUNTER — Encounter: Payer: Self-pay | Admitting: Obstetrics and Gynecology

## 2022-10-04 ENCOUNTER — Telehealth: Payer: Self-pay | Admitting: Obstetrics and Gynecology

## 2022-10-04 NOTE — Progress Notes (Signed)
Ok, thank you

## 2022-10-04 NOTE — Telephone Encounter (Signed)
Attempted to contact patient again regarding her recent lab results noting severe anemia. Unable to leave voicemail. Sent Mychart message. Will also send letter.    Hildred Laser, MD  OB/GYN at [facility]

## 2022-10-11 LAB — CYTOLOGY - PAP
Comment: NEGATIVE
Comment: NEGATIVE
Comment: NEGATIVE
Diagnosis: NEGATIVE
HPV 16: NEGATIVE
HPV 18 / 45: NEGATIVE
High risk HPV: POSITIVE — AB

## 2022-10-13 ENCOUNTER — Other Ambulatory Visit: Payer: Self-pay | Admitting: *Deleted

## 2022-10-13 ENCOUNTER — Encounter: Payer: Self-pay | Admitting: Internal Medicine

## 2022-10-13 ENCOUNTER — Inpatient Hospital Stay: Payer: 59 | Attending: Internal Medicine | Admitting: Internal Medicine

## 2022-10-13 ENCOUNTER — Inpatient Hospital Stay: Payer: 59

## 2022-10-13 VITALS — BP 131/67 | HR 91 | Temp 96.3°F | Resp 20 | Wt 213.7 lb

## 2022-10-13 DIAGNOSIS — N920 Excessive and frequent menstruation with regular cycle: Secondary | ICD-10-CM

## 2022-10-13 DIAGNOSIS — D509 Iron deficiency anemia, unspecified: Secondary | ICD-10-CM

## 2022-10-13 DIAGNOSIS — D649 Anemia, unspecified: Secondary | ICD-10-CM

## 2022-10-13 DIAGNOSIS — D500 Iron deficiency anemia secondary to blood loss (chronic): Secondary | ICD-10-CM

## 2022-10-13 NOTE — Addendum Note (Signed)
Addended byMichaelyn Barter on: 10/13/2022 03:04 PM   Modules accepted: Orders

## 2022-10-13 NOTE — Progress Notes (Addendum)
[facility]  [phone] [phone]  ID: [name] OB: August 29, 1971  MR#: [id number]  CXK#:[id number]  Patient Care Team: Patient, No Pcp Per as PCP - General (General Practice)  REFERRING PROVIDER: Guadlupe Spanish, CNM  REASON FOR REFERRAL: anemia   HPI: Frances Rodgers is a 51 y.o. female with past medical history of asthma and GERD was referred to hematology for management of severe anemia.  Patient was seen by Dr. Valentino Saxon on 09/30/2022 for preventative GYN exam.  She has heavy menstrual period.  CBC showed hemoglobin 5.4, MCV 65.  Patient has longstanding anemia.  She is on oral iron.  She has heavy menstrual period lasting 7 days and first 3 days are very heavy requiring to change pad every hour.  CMP unremarkable.  Patient is totally asymptomatic.  Denies any shortness of breath, chest pain, fatigue, dizziness or headaches.  Denies any bleeding in urine or stool.   REVIEW OF SYSTEMS:   ROS  As per HPI. Otherwise, a complete review of systems is negative.  PAST MEDICAL HISTORY: Past Medical History:  Diagnosis Date   Asthma    GERD (gastroesophageal reflux disease)    Seasonal allergies     PAST SURGICAL HISTORY: Past Surgical History:  Procedure Laterality Date   GASTRIC BYPASS  2015   NASAL POLYP SURGERY  2010   WISDOM TOOTH EXTRACTION      FAMILY HISTORY: Family History  Problem Relation Age of Onset   Diabetes Mother    Diabetes Brother    Pancreatic cancer Maternal Grandmother     HEALTH MAINTENANCE: Social History   Tobacco Use   Smoking status: Never   Smokeless tobacco: Never  Vaping Use   Vaping Use: Never used  Substance Use Topics   Alcohol use: Never   Drug use: Never     Allergies  Allergen Reactions   Aspirin Anaphylaxis and Shortness Of Breath   Ketorolac Anaphylaxis   Ketorolac Tromethamine Shortness Of Breath    Current Outpatient Medications  Medication Sig Dispense Refill    albuterol (VENTOLIN HFA) 108 (90 Base) MCG/ACT inhaler Inhale into the lungs every 6 (six) hours as needed for wheezing or shortness of breath.     Azelastine HCl 137 MCG/SPRAY SOLN      Cetirizine HCl 10 MG CAPS Take by mouth.     EPINEPHrine 0.3 mg/0.3 mL IJ SOAJ injection SMARTSIG:0.3 Milliliter(s) IM Once PRN     FLOVENT DISKUS 50 MCG/ACT AEPB Take 1 Inhalation by mouth every 12 (twelve) hours as needed.     ipratropium (ATROVENT) 0.06 % nasal spray SMARTSIG:1-2 Spray(s) Both Nares 2-3 Times Daily     JUNEL FE 24 1-20 MG-MCG(24) tablet TAKE 1 TABLET BY MOUTH  DAILY 84 tablet 3   montelukast (SINGULAIR) 10 MG tablet Take by mouth.     Multiple Vitamin (MULTI VITAMIN DAILY PO) Take by mouth.     niacin (VITAMIN B3) 250 MG tablet Take 250 mg by mouth at bedtime.     pantoprazole (PROTONIX) 40 MG tablet ONE TABLE 30 MINUTES PRIOR TO FIRST MEAL OF DAY     No current facility-administered medications for this visit.    OBJECTIVE: Vitals:   10/13/22 1354  BP: 131/67  Pulse: 91  Resp: 20  Temp: (!) 96.3 F (35.7 C)  SpO2: 100%     Body mass index is 34.49 kg/m.      General: Well-developed, well-nourished, no acute distress. Eyes: Pink conjunctiva, anicteric sclera. HEENT: Normocephalic,  moist mucous membranes, clear oropharnyx. Lungs: Clear to auscultation bilaterally. Heart: Regular rate and rhythm. No rubs, murmurs, or gallops. Abdomen: Soft, nontender, nondistended. No organomegaly noted, normoactive bowel sounds. Musculoskeletal: No edema, cyanosis, or clubbing. Neuro: Alert, answering all questions appropriately. Cranial nerves grossly intact. Skin: No rashes or petechiae noted. Psych: Normal affect. Lymphatics: No cervical, calvicular, axillary or inguinal LAD.   LAB RESULTS:  Lab Results  Component Value Date   NA 136 09/30/2022   K 4.0 09/30/2022   CL 102 09/30/2022   CO2 19 (L) 09/30/2022   GLUCOSE 128 (H) 09/30/2022   BUN 12 09/30/2022   CREATININE 0.88  09/30/2022   CALCIUM 9.2 09/30/2022   PROT 7.0 09/30/2022   ALBUMIN 3.9 09/30/2022   AST 11 09/30/2022   ALT 15 09/30/2022   ALKPHOS 57 09/30/2022   BILITOT 0.3 09/30/2022   GFRNONAA 80 07/08/2020   GFRAA 92 07/08/2020    Lab Results  Component Value Date   WBC 7.9 09/30/2022   NEUTROABS 4.0 08/13/2021   HGB 5.4 (LL) 09/30/2022   HCT 20.4 (L) 09/30/2022   MCV 65 (L) 09/30/2022   PLT 507 (H) 09/30/2022    Lab Results  Component Value Date   TIBC 500 (H) 12/24/2013   FERRITIN 4 (L) 12/24/2013   IRONPCTSAT 5 12/24/2013     STUDIES: No results found.  ASSESSMENT AND PLAN:   Frances Rodgers is a 51 y.o. female with pmh of of asthma and GERD was referred to hematology for management of severe anemia.  # Severe microcytic anemia -Progressive in nature.  Not responsive to oral iron  -Patient was seen by Dr. Marcelline Mates on 09/30/2022 for preventative GYN exam. CBC showed hemoglobin 5.4, MCV 65.  Patient has longstanding anemia.  Has heavy menstrual period  -Labs as below.  I will also hold tube for blood bank in case she needs blood transfusion.  Patient cannot come for blood transfusion tomorrow due to work.  Per her request she will be scheduled for Monday.  GI referral was placed by Dr. Marcelline Mates on 09/30/2022 to assess for bleeding source.  Her insurance will cover IV Venofer.  I will do Venofer 200 mg of 5 infusions over 2 to 3 weeks.  Discussed about low but potential risk of anaphylactic reaction.  Patient will stop oral iron.  Orders Placed This Encounter  Procedures   CBC with Differential   Iron and TIBC([facility])   Ferritin   Vitamin B12   Folate   CBC with Differential   Iron and TIBC([facility])   Ferritin   Hold Tube- Blood Bank   RTC in 10 weeks for MD visit, labs prior Scheduled for IV Venofer x 5 over 2 to 3 weeks Possible blood transfusion tomorrow   Patient expressed understanding and was in agreement with this plan. She also understands that  She can call clinic at any time with any questions, concerns, or complaints.   I spent a total of 45 minutes reviewing chart data, face-to-face evaluation with the patient, counseling and coordination of care as detailed above.  Jane Canary, MD   10/13/2022 2:34 PM

## 2022-10-14 ENCOUNTER — Other Ambulatory Visit: Payer: Self-pay | Admitting: *Deleted

## 2022-10-14 ENCOUNTER — Encounter: Payer: Self-pay | Admitting: Internal Medicine

## 2022-10-14 ENCOUNTER — Ambulatory Visit: Payer: 59

## 2022-10-14 ENCOUNTER — Inpatient Hospital Stay: Payer: 59 | Attending: Internal Medicine

## 2022-10-14 ENCOUNTER — Encounter: Payer: Self-pay | Admitting: *Deleted

## 2022-10-14 DIAGNOSIS — D649 Anemia, unspecified: Secondary | ICD-10-CM

## 2022-10-14 DIAGNOSIS — D500 Iron deficiency anemia secondary to blood loss (chronic): Secondary | ICD-10-CM

## 2022-10-14 DIAGNOSIS — D509 Iron deficiency anemia, unspecified: Secondary | ICD-10-CM | POA: Diagnosis not present

## 2022-10-14 LAB — SAMPLE TO BLOOD BANK

## 2022-10-14 LAB — CBC WITH DIFFERENTIAL/PLATELET
Abs Immature Granulocytes: 0.02 10*3/uL (ref 0.00–0.07)
Basophils Absolute: 0 10*3/uL (ref 0.0–0.1)
Basophils Relative: 1 %
Eosinophils Absolute: 0.1 10*3/uL (ref 0.0–0.5)
Eosinophils Relative: 2 %
HCT: 23.7 % — ABNORMAL LOW (ref 36.0–46.0)
Hemoglobin: 5.9 g/dL — ABNORMAL LOW (ref 12.0–15.0)
Immature Granulocytes: 0 %
Lymphocytes Relative: 18 %
Lymphs Abs: 1.1 10*3/uL (ref 0.7–4.0)
MCH: 16.7 pg — ABNORMAL LOW (ref 26.0–34.0)
MCHC: 24.9 g/dL — ABNORMAL LOW (ref 30.0–36.0)
MCV: 67.1 fL — ABNORMAL LOW (ref 80.0–100.0)
Monocytes Absolute: 0.5 10*3/uL (ref 0.1–1.0)
Monocytes Relative: 8 %
Neutro Abs: 4.4 10*3/uL (ref 1.7–7.7)
Neutrophils Relative %: 71 %
Platelets: 180 10*3/uL (ref 150–400)
RBC: 3.53 MIL/uL — ABNORMAL LOW (ref 3.87–5.11)
RDW: 23.2 % — ABNORMAL HIGH (ref 11.5–15.5)
WBC: 6.2 10*3/uL (ref 4.0–10.5)
nRBC: 0 % (ref 0.0–0.2)

## 2022-10-14 LAB — PREPARE RBC (CROSSMATCH)

## 2022-10-14 LAB — IRON AND TIBC
Iron: 18 ug/dL — ABNORMAL LOW (ref 28–170)
Saturation Ratios: 4 % — ABNORMAL LOW (ref 10.4–31.8)
TIBC: 504 ug/dL — ABNORMAL HIGH (ref 250–450)
UIBC: 486 ug/dL

## 2022-10-14 LAB — FERRITIN: Ferritin: 2 ng/mL — ABNORMAL LOW (ref 11–307)

## 2022-10-14 LAB — VITAMIN B12: Vitamin B-12: 992 pg/mL — ABNORMAL HIGH (ref 180–914)

## 2022-10-14 LAB — FOLATE: Folate: 9.1 ng/mL (ref >5.9)

## 2022-10-14 LAB — ABO/RH: ABO/RH(D): O POS

## 2022-10-17 ENCOUNTER — Inpatient Hospital Stay: Payer: 59

## 2022-10-17 DIAGNOSIS — D509 Iron deficiency anemia, unspecified: Secondary | ICD-10-CM | POA: Diagnosis not present

## 2022-10-17 DIAGNOSIS — D649 Anemia, unspecified: Secondary | ICD-10-CM

## 2022-10-17 DIAGNOSIS — D500 Iron deficiency anemia secondary to blood loss (chronic): Secondary | ICD-10-CM

## 2022-10-17 MED ORDER — SODIUM CHLORIDE 0.9% IV SOLUTION
250.0000 mL | Freq: Once | INTRAVENOUS | Status: AC
  Administered 2022-10-17: 250 mL via INTRAVENOUS
  Filled 2022-10-17: qty 250

## 2022-10-18 LAB — TYPE AND SCREEN
ABO/RH(D): O POS
Antibody Screen: NEGATIVE
Unit division: 0
Unit division: 0

## 2022-10-18 LAB — BPAM RBC
Blood Product Expiration Date: 202312282359
Blood Product Expiration Date: 202401032359
ISSUE DATE / TIME: 202312011428
ISSUE DATE / TIME: 202312040948
Unit Type and Rh: 5100
Unit Type and Rh: 5100

## 2022-10-27 ENCOUNTER — Inpatient Hospital Stay: Payer: 59

## 2022-10-27 VITALS — BP 107/61 | HR 84 | Temp 97.5°F | Resp 18

## 2022-10-27 DIAGNOSIS — D509 Iron deficiency anemia, unspecified: Secondary | ICD-10-CM | POA: Diagnosis not present

## 2022-10-27 DIAGNOSIS — D500 Iron deficiency anemia secondary to blood loss (chronic): Secondary | ICD-10-CM

## 2022-10-27 MED ORDER — SODIUM CHLORIDE 0.9 % IV SOLN
Freq: Once | INTRAVENOUS | Status: AC
  Filled 2022-10-27: qty 250

## 2022-10-27 MED ORDER — SODIUM CHLORIDE 0.9 % IV SOLN
200.0000 mg | Freq: Once | INTRAVENOUS | Status: AC
  Administered 2022-10-27: 200 mg via INTRAVENOUS
  Filled 2022-10-27: qty 200

## 2022-10-27 NOTE — Patient Instructions (Signed)

## 2022-11-03 ENCOUNTER — Inpatient Hospital Stay: Payer: 59

## 2022-11-03 VITALS — BP 116/66 | HR 80 | Temp 96.8°F | Resp 18

## 2022-11-03 DIAGNOSIS — D500 Iron deficiency anemia secondary to blood loss (chronic): Secondary | ICD-10-CM

## 2022-11-03 DIAGNOSIS — D509 Iron deficiency anemia, unspecified: Secondary | ICD-10-CM | POA: Diagnosis not present

## 2022-11-03 MED ORDER — SODIUM CHLORIDE 0.9 % IV SOLN
Freq: Once | INTRAVENOUS | Status: AC
  Filled 2022-11-03: qty 250

## 2022-11-03 MED ORDER — SODIUM CHLORIDE 0.9 % IV SOLN
200.0000 mg | Freq: Once | INTRAVENOUS | Status: AC
  Administered 2022-11-03: 200 mg via INTRAVENOUS
  Filled 2022-11-03: qty 200

## 2022-11-10 ENCOUNTER — Inpatient Hospital Stay: Payer: 59

## 2022-11-10 VITALS — BP 125/75 | HR 82 | Resp 16

## 2022-11-10 DIAGNOSIS — D509 Iron deficiency anemia, unspecified: Secondary | ICD-10-CM | POA: Diagnosis not present

## 2022-11-10 DIAGNOSIS — D500 Iron deficiency anemia secondary to blood loss (chronic): Secondary | ICD-10-CM

## 2022-11-10 MED ORDER — SODIUM CHLORIDE 0.9 % IV SOLN
200.0000 mg | Freq: Once | INTRAVENOUS | Status: AC
  Administered 2022-11-10: 200 mg via INTRAVENOUS
  Filled 2022-11-10: qty 200

## 2022-11-10 MED ORDER — SODIUM CHLORIDE 0.9 % IV SOLN
Freq: Once | INTRAVENOUS | Status: AC
  Filled 2022-11-10: qty 250

## 2022-11-10 NOTE — Progress Notes (Signed)
Pt refused to wait 30 minute post observation period.  Pt w/o complaints.  VSS.  Pt understands to present to ED if any signs or symptoms of reaction occur.

## 2022-11-10 NOTE — Patient Instructions (Signed)

## 2022-11-17 ENCOUNTER — Inpatient Hospital Stay: Payer: 59 | Attending: Internal Medicine

## 2022-11-17 VITALS — BP 110/63 | HR 84 | Resp 16

## 2022-11-17 DIAGNOSIS — D509 Iron deficiency anemia, unspecified: Secondary | ICD-10-CM | POA: Insufficient documentation

## 2022-11-17 DIAGNOSIS — Z9884 Bariatric surgery status: Secondary | ICD-10-CM | POA: Diagnosis not present

## 2022-11-17 DIAGNOSIS — N920 Excessive and frequent menstruation with regular cycle: Secondary | ICD-10-CM | POA: Insufficient documentation

## 2022-11-17 DIAGNOSIS — D500 Iron deficiency anemia secondary to blood loss (chronic): Secondary | ICD-10-CM

## 2022-11-17 MED ORDER — SODIUM CHLORIDE 0.9 % IV SOLN
200.0000 mg | Freq: Once | INTRAVENOUS | Status: AC
  Administered 2022-11-17: 200 mg via INTRAVENOUS
  Filled 2022-11-17: qty 200

## 2022-11-17 MED ORDER — SODIUM CHLORIDE 0.9 % IV SOLN
Freq: Once | INTRAVENOUS | Status: AC
  Filled 2022-11-17: qty 250

## 2022-11-17 NOTE — Progress Notes (Signed)
Pt refusing 30 minute post monitoring 

## 2022-11-24 ENCOUNTER — Inpatient Hospital Stay: Payer: 59

## 2022-11-24 VITALS — BP 124/70 | HR 77 | Temp 98.7°F | Resp 18

## 2022-11-24 DIAGNOSIS — D500 Iron deficiency anemia secondary to blood loss (chronic): Secondary | ICD-10-CM

## 2022-11-24 DIAGNOSIS — D509 Iron deficiency anemia, unspecified: Secondary | ICD-10-CM | POA: Diagnosis not present

## 2022-11-24 MED ORDER — SODIUM CHLORIDE 0.9 % IV SOLN
Freq: Once | INTRAVENOUS | Status: AC
  Filled 2022-11-24: qty 250

## 2022-11-24 MED ORDER — SODIUM CHLORIDE 0.9 % IV SOLN
200.0000 mg | Freq: Once | INTRAVENOUS | Status: AC
  Administered 2022-11-24: 200 mg via INTRAVENOUS
  Filled 2022-11-24: qty 200

## 2022-11-24 NOTE — Patient Instructions (Signed)

## 2022-11-24 NOTE — Progress Notes (Signed)
Patient refused 30 minute post-infusion observation. Vital signs stable upon discharge.

## 2022-12-01 ENCOUNTER — Inpatient Hospital Stay: Payer: 59

## 2022-12-01 DIAGNOSIS — D649 Anemia, unspecified: Secondary | ICD-10-CM

## 2022-12-01 DIAGNOSIS — D509 Iron deficiency anemia, unspecified: Secondary | ICD-10-CM | POA: Diagnosis not present

## 2022-12-01 LAB — CBC WITH DIFFERENTIAL/PLATELET
Abs Immature Granulocytes: 0.02 10*3/uL (ref 0.00–0.07)
Basophils Absolute: 0 10*3/uL (ref 0.0–0.1)
Basophils Relative: 0 %
Eosinophils Absolute: 0.2 10*3/uL (ref 0.0–0.5)
Eosinophils Relative: 2 %
HCT: 36.5 % (ref 36.0–46.0)
Hemoglobin: 10.5 g/dL — ABNORMAL LOW (ref 12.0–15.0)
Immature Granulocytes: 0 %
Lymphocytes Relative: 31 %
Lymphs Abs: 2.1 10*3/uL (ref 0.7–4.0)
MCH: 23.2 pg — ABNORMAL LOW (ref 26.0–34.0)
MCHC: 28.8 g/dL — ABNORMAL LOW (ref 30.0–36.0)
MCV: 80.6 fL (ref 80.0–100.0)
Monocytes Absolute: 0.5 10*3/uL (ref 0.1–1.0)
Monocytes Relative: 7 %
Neutro Abs: 4 10*3/uL (ref 1.7–7.7)
Neutrophils Relative %: 60 %
Platelets: 336 10*3/uL (ref 150–400)
RBC: 4.53 MIL/uL (ref 3.87–5.11)
RDW: 24.5 % — ABNORMAL HIGH (ref 11.5–15.5)
WBC: 6.8 10*3/uL (ref 4.0–10.5)
nRBC: 0 % (ref 0.0–0.2)

## 2022-12-01 LAB — IRON AND TIBC
Iron: 51 ug/dL (ref 28–170)
Saturation Ratios: 13 % (ref 10.4–31.8)
TIBC: 389 ug/dL (ref 250–450)
UIBC: 338 ug/dL

## 2022-12-01 LAB — FERRITIN: Ferritin: 118 ng/mL (ref 11–307)

## 2022-12-08 ENCOUNTER — Encounter: Payer: Self-pay | Admitting: Internal Medicine

## 2022-12-08 ENCOUNTER — Inpatient Hospital Stay (HOSPITAL_BASED_OUTPATIENT_CLINIC_OR_DEPARTMENT_OTHER): Payer: 59 | Admitting: Internal Medicine

## 2022-12-08 VITALS — BP 127/69 | HR 71 | Temp 97.0°F | Resp 20 | Wt 213.3 lb

## 2022-12-08 DIAGNOSIS — N920 Excessive and frequent menstruation with regular cycle: Secondary | ICD-10-CM | POA: Diagnosis not present

## 2022-12-08 DIAGNOSIS — D509 Iron deficiency anemia, unspecified: Secondary | ICD-10-CM

## 2022-12-08 NOTE — Progress Notes (Signed)
[name]  Telephone:([phone] Fax:(336) [phone]  ID: [name] OB: [birth year]  MR#: [id number]  YTK#:[id number]  Patient Care Team: Patient, No Pcp Per as PCP - General (General Practice)  REFERRING PROVIDER: Lloyd Huger, CNM  REASON FOR REFERRAL: anemia   HPI: Frances Rodgers is a 52 y.o. female with past medical history of asthma and GERD was referred to hematology for management of severe anemia.  Patient was seen by Dr. Marcelline Mates on 09/30/2022 for preventative GYN exam.  She has heavy menstrual period.  CBC showed hemoglobin 5.4, MCV 65.  Patient has longstanding anemia.  She is on oral iron.  She has heavy menstrual period lasting 7 days and first 3 days are very heavy requiring to change pad every hour.  CMP unremarkable.  Patient is totally asymptomatic.  Denies any shortness of breath, chest pain, fatigue, dizziness or headaches.  Denies any bleeding in urine or stool.  Interval history Patient was seen today to discuss labs. She completed 5 doses of IV Venofer and tolerated well.  She continues to have heavy menstrual period.  Otherwise denies any new symptoms.  REVIEW OF SYSTEMS:   ROS  As per HPI. Otherwise, a complete review of systems is negative.  PAST MEDICAL HISTORY: Past Medical History:  Diagnosis Date   Asthma    GERD (gastroesophageal reflux disease)    Seasonal allergies     PAST SURGICAL HISTORY: Past Surgical History:  Procedure Laterality Date   GASTRIC BYPASS  2015   NASAL POLYP SURGERY  2010   WISDOM TOOTH EXTRACTION      FAMILY HISTORY: Family History  Problem Relation Age of Onset   Diabetes Mother    Diabetes Brother    Pancreatic cancer Maternal Grandmother     HEALTH MAINTENANCE: Social History   Tobacco Use   Smoking status: Never   Smokeless tobacco: Never  Vaping Use   Vaping Use: Never used  Substance Use Topics   Alcohol use: Never   Drug use: Never     Allergies  Allergen  Reactions   Aspirin Anaphylaxis and Shortness Of Breath   Ketorolac Anaphylaxis   Ketorolac Tromethamine Shortness Of Breath    Current Outpatient Medications  Medication Sig Dispense Refill   albuterol (VENTOLIN HFA) 108 (90 Base) MCG/ACT inhaler Inhale into the lungs every 6 (six) hours as needed for wheezing or shortness of breath.     Cetirizine HCl 10 MG CAPS Take by mouth.     EPINEPHrine 0.3 mg/0.3 mL IJ SOAJ injection SMARTSIG:0.3 Milliliter(s) IM Once PRN     FLOVENT DISKUS 50 MCG/ACT AEPB Take 1 Inhalation by mouth every 12 (twelve) hours as needed.     JUNEL FE 24 1-20 MG-MCG(24) tablet TAKE 1 TABLET BY MOUTH  DAILY 84 tablet 3   montelukast (SINGULAIR) 10 MG tablet Take by mouth.     Multiple Vitamin (MULTI VITAMIN DAILY PO) Take by mouth.     niacin (VITAMIN B3) 250 MG tablet Take 250 mg by mouth at bedtime.     pantoprazole (PROTONIX) 40 MG tablet ONE TABLE 30 MINUTES PRIOR TO FIRST MEAL OF DAY     Azelastine HCl 137 MCG/SPRAY SOLN      ipratropium (ATROVENT) 0.06 % nasal spray SMARTSIG:1-2 Spray(s) Both Nares 2-3 Times Daily     No current facility-administered medications for this visit.    OBJECTIVE: Vitals:   12/08/22 1456  BP: 127/69  Pulse: 71  Resp: 20  Temp: (!) 97 F (  36.1 C)  SpO2: 100%     Body mass index is 34.43 kg/m.      General: Well-developed, well-nourished, no acute distress. Eyes: Pink conjunctiva, anicteric sclera. HEENT: Normocephalic, moist mucous membranes, clear oropharnyx. Lungs: Clear to auscultation bilaterally. Heart: Regular rate and rhythm. No rubs, murmurs, or gallops. Abdomen: Soft, nontender, nondistended. No organomegaly noted, normoactive bowel sounds. Musculoskeletal: No edema, cyanosis, or clubbing. Neuro: Alert, answering all questions appropriately. Cranial nerves grossly intact. Skin: No rashes or petechiae noted. Psych: Normal affect. Lymphatics: No cervical, calvicular, axillary or inguinal LAD.   LAB  RESULTS:  Lab Results  Component Value Date   NA 136 09/30/2022   K 4.0 09/30/2022   CL 102 09/30/2022   CO2 19 (L) 09/30/2022   GLUCOSE 128 (H) 09/30/2022   BUN 12 09/30/2022   CREATININE 0.88 09/30/2022   CALCIUM 9.2 09/30/2022   PROT 7.0 09/30/2022   ALBUMIN 3.9 09/30/2022   AST 11 09/30/2022   ALT 15 09/30/2022   ALKPHOS 57 09/30/2022   BILITOT 0.3 09/30/2022   GFRNONAA 80 07/08/2020   GFRAA 92 07/08/2020    Lab Results  Component Value Date   WBC 6.8 12/01/2022   NEUTROABS 4.0 12/01/2022   HGB 10.5 (L) 12/01/2022   HCT 36.5 12/01/2022   MCV 80.6 12/01/2022   PLT 336 12/01/2022    Lab Results  Component Value Date   TIBC 389 12/01/2022   TIBC 504 (H) 10/14/2022   TIBC 500 (H) 12/24/2013   FERRITIN 118 12/01/2022   FERRITIN 2 (L) 10/14/2022   FERRITIN 4 (L) 12/24/2013   IRONPCTSAT 13 12/01/2022   IRONPCTSAT 4 (L) 10/14/2022   IRONPCTSAT 5 12/24/2013     STUDIES: No results found.  ASSESSMENT AND PLAN:   Frances Rodgers is a 52 y.o. female with pmh of of asthma and GERD was referred to hematology for management of severe anemia.  # Severe microcytic anemia -likely secondary to heavy menstrual period. Patient was nonresponsive oral iron.  Patient received 1 unit of PRBC for hemoglobin of 5.4.  She was then treated with IV Venofer 200 mg weekly x 5 doses.  Repeat iron panel showed good response to the treatment.  Her hemoglobin has improved to 10.5.  Patient continues to have heavy menstrual period.  We discussed that she will continue to have ongoing need for IV iron since her source of bleeding is not controlled.  Patient declined further IV infusions due to high co-pays.  She is taking over-the-counter iron supplements.  -She was reached out by GI for screening colonoscopy but unable to get in touch with the patient.  Will rediscuss with the patient next visit.   Orders Placed This Encounter  Procedures   Iron and TIBC   Ferritin   CBC with  Differential/Platelet   RTC in 3 months for MD visit, labs.  Patient expressed understanding and was in agreement with this plan. She also understands that She can call clinic at any time with any questions, concerns, or complaints.   I spent a total of 30 minutes reviewing chart data, face-to-face evaluation with the patient, counseling and coordination of care as detailed above.  Jane Canary, MD   12/08/2022 4:33 PM

## 2023-02-14 ENCOUNTER — Other Ambulatory Visit: Payer: Self-pay

## 2023-02-14 ENCOUNTER — Encounter: Payer: Self-pay | Admitting: Internal Medicine

## 2023-02-16 ENCOUNTER — Encounter: Payer: Self-pay | Admitting: Obstetrics and Gynecology

## 2023-02-16 ENCOUNTER — Encounter: Payer: Self-pay | Admitting: Internal Medicine

## 2023-02-16 NOTE — Telephone Encounter (Signed)
The patient is scheduled for 5/1 with Dr. Marcelline Mates. The patient is aware.

## 2023-03-07 ENCOUNTER — Ambulatory Visit
Admission: RE | Admit: 2023-03-07 | Discharge: 2023-03-07 | Disposition: A | Discharge status: Home / Self Care | Payer: 59 | Source: Ambulatory Visit

## 2023-03-07 ENCOUNTER — Encounter: Payer: Self-pay | Admitting: Internal Medicine

## 2023-03-07 VITALS — BP 132/86 | HR 96 | Temp 97.8°F | Resp 16

## 2023-03-07 DIAGNOSIS — M25512 Pain in left shoulder: Secondary | ICD-10-CM

## 2023-03-07 NOTE — Discharge Instructions (Signed)
Recommend you seek evaluation and treatment at an orthopedic urgent care.

## 2023-03-07 NOTE — ED Triage Notes (Signed)
Patient presents to [facility] for joint pain over a month. States left elbow pain radiates to upper arm and neck. States it is now affecting her ROM. Pain worse with movement. Treating pain with tylenol with no relief.   Denies injury, numbness or tingling.

## 2023-03-07 NOTE — ED Provider Notes (Signed)
Frances Rodgers    CSN: [id number] Arrival date & time: 03/07/23  0805      History   Chief Complaint Chief Complaint  Patient presents with   Joint Pain    Extreme pain in my shoulder and elbow. - Entered by patient    HPI Frances Rodgers is a 52 y.o. female.   HPI  Patient presents to urgent care with complaint of joint pain x 1 month.  She endorses "left elbow pain radiates to her upper arm and neck.".  Affecting range of motion per patient.  Pain worse with movement.  She denies injury, numbness, tingling.  Past Medical History:  Diagnosis Date   Asthma    GERD (gastroesophageal reflux disease)    Seasonal allergies     Patient Active Problem List   Diagnosis Date Noted   Anemia 10/01/2021   Asthma 10/01/2021   Gastroesophageal reflux disease 10/01/2021   Iron deficiency anemia 07/09/2020   Moderate obesity 07/09/2020   Menorrhagia with regular cycle 07/09/2020    Past Surgical History:  Procedure Laterality Date   GASTRIC BYPASS  2015   NASAL POLYP SURGERY  2010   WISDOM TOOTH EXTRACTION      OB History     Gravida  1   Para  1   Term  1   Preterm      AB      Living  1      SAB      IAB      Ectopic      Multiple      Live Births  1            Home Medications    Prior to Admission medications   Medication Sig Start Date End Date Taking? Authorizing Provider  albuterol (VENTOLIN HFA) 108 (90 Base) MCG/ACT inhaler Inhale into the lungs every 6 (six) hours as needed for wheezing or shortness of breath.    [provider]  Azelastine HCl 137 MCG/SPRAY SOLN  01/17/20   [provider]  Cetirizine HCl 10 MG CAPS Take by mouth. 06/02/03   [provider]  EPINEPHrine 0.3 mg/0.3 mL IJ SOAJ injection SMARTSIG:0.3 Milliliter(s) IM Once PRN 06/09/21   [provider]  FLOVENT DISKUS 50 MCG/ACT AEPB Take 1 Inhalation by mouth every 12 (twelve) hours as needed.    [provider]   ipratropium (ATROVENT) 0.06 % nasal spray SMARTSIG:1-2 Spray(s) Both Nares 2-3 Times Daily 06/15/21   [provider]  JUNEL FE 24 1-20 MG-MCG(24) tablet TAKE 1 TABLET BY MOUTH DAILY 02/14/23   Hildred Laser, MD  montelukast (SINGULAIR) 10 MG tablet Take by mouth.    [provider]  Multiple Vitamin (MULTI VITAMIN DAILY PO) Take by mouth.    [provider]  niacin (VITAMIN B3) 250 MG tablet Take 250 mg by mouth at bedtime.    [provider]  pantoprazole (PROTONIX) 40 MG tablet ONE TABLE 30 MINUTES PRIOR TO FIRST MEAL OF DAY 04/12/19   [provider]    Family History Family History  Problem Relation Age of Onset   Diabetes Mother    Diabetes Brother    Pancreatic cancer Maternal Grandmother     Social History Social History   Tobacco Use   Smoking status: Never   Smokeless tobacco: Never  Vaping Use   Vaping Use: Never used  Substance Use Topics   Alcohol use: Never   Drug use: Never  Allergies   Aspirin, Ketorolac, and Ketorolac tromethamine   Review of Systems Review of Systems   Physical Exam Triage Vital Signs ED Triage Vitals  Enc Vitals Group     BP      Pulse      Resp      Temp      Temp src      SpO2      Weight      Height      Head Circumference      Peak Flow      Pain Score      Pain Loc      Pain Edu?      Excl. in GC?    No data found.  Updated Vital Signs There were no vitals taken for this visit.  Visual Acuity Right Eye Distance:   Left Eye Distance:   Bilateral Distance:    Right Eye Near:   Left Eye Near:    Bilateral Near:     Physical Exam Vitals reviewed.  Constitutional:      Appearance: Normal appearance.  Neurological:     General: No focal deficit present.     Mental Status: She is alert and oriented to person, place, and time.  Psychiatric:        Mood and Affect: Mood normal.        Behavior: Behavior normal.      UC Treatments / Results  Labs (all labs  ordered are listed, but only abnormal results are displayed) Labs Reviewed - No data to display  EKG   Radiology No results found.  Procedures Procedures (including critical care time)  Medications Ordered in UC Medications - No data to display  Initial Impression / Assessment and Plan / UC Course  I have reviewed the triage vital signs and the nursing notes.  Pertinent labs & imaging results that were available during my care of the patient were reviewed by me and considered in my medical decision making (see chart for details).   Frances Rodgers is a 51 y.o. female presenting with sub-acute L shoulder pain. Patient is afebrile without recent antipyretics, satting well on room air. Overall is well appearing though non-toxic, well hydrated, without respiratory distress.   No physical evaluation was performed.  Discussed available evaluation and treatment modalities with the patient.  Given the patient's described symptoms, she likely needs imaging, possibly advanced imaging.  She is allergic to ketorolac and so unable to provide a Toradol IM.  Discussed treatment with a steroid however the patient states she preferred to be evaluated at a full service orthopedic clinic.   Final Clinical Impressions(s) / UC Diagnoses   Final diagnoses:  None   Discharge Instructions   None    ED Prescriptions   None    PDMP not reviewed this encounter.   Charma Igo, [place] 03/07/23 [phone]

## 2023-03-09 DIAGNOSIS — M249 Joint derangement, unspecified: Secondary | ICD-10-CM | POA: Insufficient documentation

## 2023-03-09 DIAGNOSIS — M25512 Pain in left shoulder: Secondary | ICD-10-CM

## 2023-03-09 HISTORY — DX: Pain in left shoulder: M25.512

## 2023-03-10 ENCOUNTER — Inpatient Hospital Stay: Payer: 59 | Attending: Internal Medicine | Admitting: Internal Medicine

## 2023-03-10 ENCOUNTER — Encounter: Payer: Self-pay | Admitting: Internal Medicine

## 2023-03-10 ENCOUNTER — Inpatient Hospital Stay: Payer: 59

## 2023-03-10 VITALS — BP 134/79 | HR 91 | Temp 98.9°F | Resp 20 | Wt 214.3 lb

## 2023-03-10 DIAGNOSIS — D509 Iron deficiency anemia, unspecified: Secondary | ICD-10-CM | POA: Insufficient documentation

## 2023-03-10 DIAGNOSIS — N920 Excessive and frequent menstruation with regular cycle: Secondary | ICD-10-CM | POA: Diagnosis not present

## 2023-03-10 LAB — CBC WITH DIFFERENTIAL/PLATELET
Abs Immature Granulocytes: 0.07 10*3/uL (ref 0.00–0.07)
Basophils Absolute: 0 10*3/uL (ref 0.0–0.1)
Basophils Relative: 0 %
Eosinophils Absolute: 0 10*3/uL (ref 0.0–0.5)
Eosinophils Relative: 0 %
HCT: 35.8 % — ABNORMAL LOW (ref 36.0–46.0)
Hemoglobin: 11.2 g/dL — ABNORMAL LOW (ref 12.0–15.0)
Immature Granulocytes: 1 %
Lymphocytes Relative: 10 %
Lymphs Abs: 1.1 10*3/uL (ref 0.7–4.0)
MCH: 25.6 pg — ABNORMAL LOW (ref 26.0–34.0)
MCHC: 31.3 g/dL (ref 30.0–36.0)
MCV: 81.9 fL (ref 80.0–100.0)
Monocytes Absolute: 0.2 10*3/uL (ref 0.1–1.0)
Monocytes Relative: 2 %
Neutro Abs: 9.5 10*3/uL — ABNORMAL HIGH (ref 1.7–7.7)
Neutrophils Relative %: 87 %
Platelets: 357 10*3/uL (ref 150–400)
RBC: 4.37 MIL/uL (ref 3.87–5.11)
RDW: 15.3 % (ref 11.5–15.5)
WBC: 11 10*3/uL — ABNORMAL HIGH (ref 4.0–10.5)
nRBC: 0 % (ref 0.0–0.2)

## 2023-03-10 LAB — FERRITIN: Ferritin: 12 ng/mL (ref 11–307)

## 2023-03-10 LAB — IRON AND TIBC
Iron: 140 ug/dL (ref 28–170)
Saturation Ratios: 30 % (ref 10.4–31.8)
TIBC: 475 ug/dL — ABNORMAL HIGH (ref 250–450)
UIBC: 335 ug/dL

## 2023-03-10 NOTE — Progress Notes (Signed)
Patient has no concerns 

## 2023-03-10 NOTE — Progress Notes (Signed)
[facility]  Telephone:(336) [phone] Fax:(336) [phone]  ID: Frances Rodgers OB: [birth year]  MR#: [id number]  AOZ#:[id number]  Patient Care Team: Patient, No Pcp Per as PCP - General (General Practice)  HPI: Frances Rodgers is a 52 y.o. female with past medical history of asthma and GERD was referred to hematology for management of severe anemia.  Patient was seen by Dr. Valentino Saxon on 09/30/2022 for preventative GYN exam.  She has heavy menstrual period.  CBC showed hemoglobin 5.4, MCV 65.  Patient has longstanding anemia.  She is on oral iron.  She has heavy menstrual period lasting 7 days and first 3 days are very heavy requiring to change pad every hour.  CMP unremarkable.  Patient is totally asymptomatic.  Denies any shortness of breath, chest pain, fatigue, dizziness or headaches.  Denies any bleeding in urine or stool.  Interval history Patient was seen today to discuss labs. She completed 5 doses of IV Venofer and tolerated well.  She continues to have heavy menstrual period.  Otherwise denies any new symptoms.  She is taking iron supplements.  REVIEW OF SYSTEMS:   Review of Systems  Endo/Heme/Allergies:  Bruises/bleeds easily.    As per HPI. Otherwise, a complete review of systems is negative.  PAST MEDICAL HISTORY: Past Medical History:  Diagnosis Date   Asthma    GERD (gastroesophageal reflux disease)    Seasonal allergies     PAST SURGICAL HISTORY: Past Surgical History:  Procedure Laterality Date   GASTRIC BYPASS  2015   NASAL POLYP SURGERY  2010   WISDOM TOOTH EXTRACTION      FAMILY HISTORY: Family History  Problem Relation Age of Onset   Diabetes Mother    Diabetes Brother    Pancreatic cancer Maternal Grandmother     HEALTH MAINTENANCE: Social History   Tobacco Use   Smoking status: Never   Smokeless tobacco: Never  Vaping Use   Vaping Use: Never used  Substance Use Topics   Alcohol use: Never   Drug use: Never      Allergies  Allergen Reactions   Aspirin Anaphylaxis and Shortness Of Breath   Ketorolac Anaphylaxis   Ketorolac Tromethamine Shortness Of Breath    Current Outpatient Medications  Medication Sig Dispense Refill   albuterol (VENTOLIN HFA) 108 (90 Base) MCG/ACT inhaler Inhale into the lungs every 6 (six) hours as needed for wheezing or shortness of breath.     Cetirizine HCl 10 MG CAPS Take by mouth.     EPINEPHrine 0.3 mg/0.3 mL IJ SOAJ injection SMARTSIG:0.3 Milliliter(s) IM Once PRN     FLOVENT DISKUS 50 MCG/ACT AEPB Take 1 Inhalation by mouth every 12 (twelve) hours as needed.     JUNEL FE 24 1-20 MG-MCG(24) tablet TAKE 1 TABLET BY MOUTH DAILY 84 tablet 3   montelukast (SINGULAIR) 10 MG tablet Take by mouth.     Multiple Vitamin (MULTI VITAMIN DAILY PO) Take by mouth.     niacin (VITAMIN B3) 250 MG tablet Take 250 mg by mouth at bedtime.     pantoprazole (PROTONIX) 40 MG tablet ONE TABLE 30 MINUTES PRIOR TO FIRST MEAL OF DAY     predniSONE (DELTASONE) 10 MG tablet TAKE 6 TABLETS ON DAY 1. DECREASE BY 1TAB DAILY UNTIL FINISHED (6-5-4-3-2-1)     traMADol (ULTRAM) 50 MG tablet Take 1 tablet every 6 hours by oral route as needed, for pain.     Azelastine HCl 137 MCG/SPRAY SOLN      ipratropium (  ATROVENT) 0.06 % nasal spray SMARTSIG:1-2 Spray(s) Both Nares 2-3 Times Daily     No current facility-administered medications for this visit.    OBJECTIVE: Vitals:   03/10/23 1335  BP: 134/79  Pulse: 91  Resp: 20  Temp: 98.9 F (37.2 C)  SpO2: 99%     Body mass index is 34.59 kg/m.      General: Well-developed, well-nourished, no acute distress. Eyes: Pink conjunctiva, anicteric sclera. HEENT: Normocephalic, moist mucous membranes, clear oropharnyx. Lungs: Clear to auscultation bilaterally. Heart: Regular rate and rhythm. No rubs, murmurs, or gallops. Abdomen: Soft, nontender, nondistended. No organomegaly noted, normoactive bowel sounds. Musculoskeletal: No edema, cyanosis,  or clubbing. Neuro: Alert, answering all questions appropriately. Cranial nerves grossly intact. Skin: No rashes or petechiae noted. Psych: Normal affect. Lymphatics: No cervical, calvicular, axillary or inguinal LAD.   LAB RESULTS:  Lab Results  Component Value Date   NA 136 09/30/2022   K 4.0 09/30/2022   CL 102 09/30/2022   CO2 19 (L) 09/30/2022   GLUCOSE 128 (H) 09/30/2022   BUN 12 09/30/2022   CREATININE 0.88 09/30/2022   CALCIUM 9.2 09/30/2022   PROT 7.0 09/30/2022   ALBUMIN 3.9 09/30/2022   AST 11 09/30/2022   ALT 15 09/30/2022   ALKPHOS 57 09/30/2022   BILITOT 0.3 09/30/2022   GFRNONAA 80 07/08/2020   GFRAA 92 07/08/2020    Lab Results  Component Value Date   WBC 11.0 (H) 03/10/2023   NEUTROABS 9.5 (H) 03/10/2023   HGB 11.2 (L) 03/10/2023   HCT 35.8 (L) 03/10/2023   MCV 81.9 03/10/2023   PLT 357 03/10/2023    Lab Results  Component Value Date   TIBC 389 12/01/2022   TIBC 504 (H) 10/14/2022   TIBC 500 (H) 12/24/2013   FERRITIN 118 12/01/2022   FERRITIN 2 (L) 10/14/2022   FERRITIN 4 (L) 12/24/2013   IRONPCTSAT 13 12/01/2022   IRONPCTSAT 4 (L) 10/14/2022   IRONPCTSAT 5 12/24/2013     STUDIES: No results found.  ASSESSMENT AND PLAN:   Frances Rodgers is a 52 y.o. female with pmh of of asthma and GERD was referred to hematology for management of severe anemia.  # Iron deficiency anemia -likely secondary to heavy menstrual period.   Patient received 1 unit of PRBC for hemoglobin of 5.4.  s/p IV Venofer 200 mg weekly x 5 doses completed in November 2024.  Repeat iron panel showed good response to the treatment.  Hemoglobin today 11.1 which is stable.  Iron panel is pending.  Hold off on iron infusions.  She will continue with oral iron.  She continues to have heavy menstrual period she will follow-up with repeat labs in 5 months.  -She is agreeable for GI referral to assess for colonoscopy/endoscopy and also with her turning 51 she qualifies for  screening.  Orders Placed This Encounter  Procedures   CBC with Differential/Platelet   Iron and TIBC   Ferritin   Ambulatory referral to Gastroenterology   RTC in 3 months for MD visit, labs.  Patient expressed understanding and was in agreement with this plan. She also understands that She can call clinic at any time with any questions, concerns, or complaints.   I spent a total of 25 minutes reviewing chart data, face-to-face evaluation with the patient, counseling and coordination of care as detailed above.  Michaelyn Barter, MD   03/10/2023 2:52 PM

## 2023-03-11 ENCOUNTER — Encounter: Payer: Self-pay | Admitting: Internal Medicine

## 2023-03-15 ENCOUNTER — Ambulatory Visit: Payer: 59 | Admitting: Obstetrics and Gynecology

## 2023-03-16 ENCOUNTER — Encounter: Payer: Self-pay | Admitting: Gastroenterology

## 2023-03-16 ENCOUNTER — Ambulatory Visit (INDEPENDENT_AMBULATORY_CARE_PROVIDER_SITE_OTHER): Payer: 59 | Admitting: Gastroenterology

## 2023-03-16 ENCOUNTER — Encounter: Payer: Self-pay | Admitting: Internal Medicine

## 2023-03-16 ENCOUNTER — Other Ambulatory Visit: Payer: Self-pay

## 2023-03-16 VITALS — BP 122/80 | HR 84 | Temp 97.9°F | Ht 66.0 in | Wt 217.1 lb

## 2023-03-16 DIAGNOSIS — D500 Iron deficiency anemia secondary to blood loss (chronic): Secondary | ICD-10-CM | POA: Diagnosis not present

## 2023-03-16 NOTE — Progress Notes (Signed)
Arlyss Repress, MD [address]  [address]  [place], [place] [address]  Main: [phone]  Fax: [phone]    Gastroenterology Consultation  Referring Provider:     Michaelyn Barter, MD Primary Care Physician:  Hildred Laser, MD Primary Gastroenterologist:  Dr. Arlyss Repress Reason for Consultation: Iron deficiency anemia        HPI:   Frances Rodgers is a 52 y.o. female referred by Dr. Hildred Laser, MD  for consultation & management of iron deficiency anemia.  Patient has history of menorrhagia, currently on low-dose oral hormonal therapy.  Hide iron deficiency anemia is attributed to menorrhagia.  Her lowest hemoglobin was 5.4 in November 2023, however she has chronic severe iron deficiency anemia.  Her serum ferritin was 2 in 10/2022.  She sees Dr. Michae Kava, hematology/oncology for parenteral iron therapy.  Iron deficiency anemia significantly improved, responded to parenteral iron therapy, most recent hemoglobin 11.2 on 03/10/2023.  Patient denies any GI symptoms.  She denies any family history of GI malignancy.  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  NSAIDs: None  Antiplts/Anticoagulants/Anti thrombotics: None  GI Procedures: None  Past Medical History:  Diagnosis Date   Asthma    GERD (gastroesophageal reflux disease)    Seasonal allergies     Past Surgical History:  Procedure Laterality Date   GASTRIC BYPASS  2015   NASAL POLYP SURGERY  2010   WISDOM TOOTH EXTRACTION       Current Outpatient Medications:    albuterol (VENTOLIN HFA) 108 (90 Base) MCG/ACT inhaler, Inhale into the lungs every 6 (six) hours as needed for wheezing or shortness of breath., Disp: , Rfl:    Cetirizine HCl 10 MG CAPS, Take by mouth., Disp: , Rfl:    EPINEPHrine 0.3 mg/0.3 mL IJ SOAJ injection, SMARTSIG:0.3 Milliliter(s) IM Once PRN, Disp: , Rfl:    FLOVENT DISKUS 50 MCG/ACT AEPB, Take 1 Inhalation by mouth every  12 (twelve) hours as needed., Disp: , Rfl:    JUNEL FE 24 1-20 MG-MCG(24) tablet, TAKE 1 TABLET BY MOUTH DAILY, Disp: 84 tablet, Rfl: 3   montelukast (SINGULAIR) 10 MG tablet, Take by mouth., Disp: , Rfl:    Multiple Vitamin (MULTI VITAMIN DAILY PO), Take by mouth., Disp: , Rfl:    niacin (VITAMIN B3) 250 MG tablet, Take 250 mg by mouth at bedtime., Disp: , Rfl:    pantoprazole (PROTONIX) 40 MG tablet, ONE TABLE 30 MINUTES PRIOR TO FIRST MEAL OF DAY, Disp: , Rfl:    QVAR REDIHALER 40 MCG/ACT inhaler, Inhale 1 puff into the lungs 2 (two) times daily., Disp: , Rfl:    traMADol (ULTRAM) 50 MG tablet, Take 1 tablet every 6 hours by oral route as needed, for pain. (Patient not taking: Reported on 03/16/2023), Disp: , Rfl:    Family History  Problem Relation Age of Onset   Diabetes Mother    Diabetes Brother    Pancreatic cancer Maternal Grandmother      Social History   Tobacco Use   Smoking status: Never   Smokeless tobacco: Never  Vaping Use   Vaping Use: Never used  Substance Use Topics   Alcohol use: Never   Drug use: Never    Allergies as of 03/16/2023 - Review Complete 03/16/2023  Allergen Reaction Noted   Aspirin Anaphylaxis and Shortness Of Breath 01/12/1990   Ketorolac Anaphylaxis 03/08/2013   Ketorolac tromethamine Shortness  Of Breath 11/14/1998    Review of Systems:    All systems reviewed and negative except where noted in HPI.   Physical Exam:  BP 122/80 (BP Location: Right Arm, Patient Position: Sitting, Cuff Size: Large)   Pulse 84   Temp 97.9 F (36.6 C) (Oral)   Ht 5\' 6"  (1.676 m)   Wt 217 lb 2 oz (98.5 kg)   LMP 03/01/2023 (Approximate)   BMI 35.04 kg/m  Patient's last menstrual period was 03/01/2023 (approximate).  General:   Alert,  Well-developed, well-nourished, pleasant and cooperative in NAD Head:  Normocephalic and atraumatic. Eyes:  Sclera clear, no icterus.   Conjunctiva pink. Ears:  Normal auditory acuity. Nose:  No deformity, discharge, or  lesions. Mouth:  No deformity or lesions,oropharynx pink & moist. Neck:  Supple; no masses or thyromegaly. Lungs:  Respirations even and unlabored.  Clear throughout to auscultation.   No wheezes, crackles, or rhonchi. No acute distress. Heart:  Regular rate and rhythm; no murmurs, clicks, rubs, or gallops. Abdomen:  Normal bowel sounds. Soft, non-tender and non-distended without masses, hepatosplenomegaly or hernias noted.  No guarding or rebound tenderness.   Rectal: Not performed Msk:  Symmetrical without gross deformities.  Wearing a sling in her left shoulder. Pulses:  Normal pulses noted. Extremities:  No clubbing or edema.  No cyanosis. Neurologic:  Alert and oriented x3;  grossly normal neurologically. Skin:  Intact without significant lesions or rashes. No jaundice. Psych:  Alert and cooperative. Normal mood and affect.  Imaging Studies: None  Assessment and Plan:   Frances Rodgers is a 52 y.o. pleasant female with history of menorrhagia, chronic iron deficiency anemia Patient does not have any GI symptoms Recommend screening colonoscopy Patient will call our office next week to schedule the procedure Patient will also find out with her insurance company regarding the coverage for upper endoscopy before she schedules EGD  Follow up as needed   Arlyss Repress, MD

## 2023-03-16 NOTE — Patient Instructions (Signed)
Please call [facility] at [phone] to schedule your colonoscopy when you are ready to schedule

## 2023-04-11 ENCOUNTER — Encounter: Payer: Self-pay | Admitting: Internal Medicine

## 2023-04-12 ENCOUNTER — Ambulatory Visit (INDEPENDENT_AMBULATORY_CARE_PROVIDER_SITE_OTHER): Payer: 59 | Admitting: Nurse Practitioner

## 2023-04-12 ENCOUNTER — Encounter: Payer: Self-pay | Admitting: Nurse Practitioner

## 2023-04-12 VITALS — BP 126/80 | HR 90 | Ht 65.25 in | Wt 224.8 lb

## 2023-04-12 DIAGNOSIS — R002 Palpitations: Secondary | ICD-10-CM | POA: Diagnosis not present

## 2023-04-12 DIAGNOSIS — D500 Iron deficiency anemia secondary to blood loss (chronic): Secondary | ICD-10-CM | POA: Diagnosis not present

## 2023-04-12 DIAGNOSIS — N920 Excessive and frequent menstruation with regular cycle: Secondary | ICD-10-CM

## 2023-04-12 DIAGNOSIS — M25512 Pain in left shoulder: Secondary | ICD-10-CM | POA: Diagnosis not present

## 2023-04-12 DIAGNOSIS — D649 Anemia, unspecified: Secondary | ICD-10-CM

## 2023-04-12 NOTE — Assessment & Plan Note (Signed)
Chronic menorrhagia with extremely heavy menstrual cycles and recent diagnosis of iron deficiency anemia due to chronic blood loss.  Unfortunately it appears that her menorrhagia has caused quite significant anemia for an unknown period of time.  She has not had success with contraceptive management reducing her amount of bleeding.  We discussed possible ablation or hysterectomy given the severity of her symptoms however she is not interested in either of these procedures at this time. Plan: - Recommend ongoing discussion with gynecology about management of menorrhagia given the severity of anemia that has presented and the life-threatening risks associated with this.

## 2023-04-12 NOTE — Progress Notes (Signed)
Frances Eth, DNP, AGNP-c [facility] [address] [place], [place] [address] Main Office [phone]   New patient visit   Patient: Frances Rodgers   DOB: 03-Jan-1971   52 y.o. Female  MRN: [id number] Visit Date: 04/12/2023  Patient Care Team: Frances Laser, MD as PCP - General (Obstetrics and Gynecology)  Today's Vitals   04/12/23 0951  BP: 126/80  Pulse: 90  Weight: 224 lb 12.8 oz (102 kg)  Height: 5' 5.25" (1.657 m)   Body mass index is 37.12 kg/m.   Today's healthcare provider: Tollie Eth, NP   Chief Complaint  Patient presents with   other    New pt. Est. Talking about anemia and shoulder pain, started about 3 months ago, no injury to shoulder, did see emerge Frances Rodgers o for shoulder in [place] they did not really help,    Subjective    Frances Rodgers is a 52 y.o. female who presents today as a new patient to establish care.    Frances Rodgers presents today with concerns regarding severe anemia, which was initially detected with routine labs.  Despite undergoing multiple blood and iron infusions that have increased her hemoglobin levels, they remain below the normal range.  Interestingly, Frances Rodgers reports no anemic symptoms such as fatigue, shortness of breath, or weakness..  She does express concern about potential heart damage due to the anemia and notes occasional heart palpitations without provocation.  About has a longstanding history of heavy menstrual cycles, which have persisted despite the use of birth control intended to reduce menstrual duration and volume.  She is currently on a birth control regimen that does not alter the heaviness or duration of her periods despite continuous use.  She did not have success with use of an IUD.  In an effort to manage her anemia, felt takes a blood builder supplement and has modified her diet to include more iron rich foods.  Additionally, Frances Rodgers experiences left shoulder pain, diagnosed as inflamed inflammation  possibly due to tendinitis or bursitis.  This has been ongoing for approximately 3 months.  She was seen by an orthopedist and was treated with several rounds of prednisone and Tylenol which temporarily alleviated the pain.  Unfortunately she is unable to take any NSAIDs due to anaphylactic reaction which has significantly limited her treatment plan.  X-rays were completed by orthopedics however no bony abnormalities were noted.  No further imaging has been completed at this time.  She is currently utilizing a sling to help reduce the pain associated with movement of the shoulder.  She is also actively performing exercises daily to help prevent frozen shoulder.  History reviewed and reveals the following: Past Medical History:  Diagnosis Date   Allergy    Anemia    Asthma    Blood transfusion without reported diagnosis 2023   GERD (gastroesophageal reflux disease)    Seasonal allergies    Past Surgical History:  Procedure Laterality Date   GASTRIC BYPASS  2015   NASAL POLYP SURGERY  2010   WISDOM TOOTH EXTRACTION     Family Status  Relation Name Status   Mother Frances Rodgers Alive   Father  Alive   Sister  Alive   Brother Frances Rodgers Alive   MGM  Deceased   MGF  Deceased   PGM  Deceased   PGF  Deceased   Sister  Alive   Sister  Alive   Family History  Problem Relation Age of Onset  Diabetes Mother    Stroke Mother    Diabetes Brother    Stroke Brother    Pancreatic cancer Maternal Grandmother    Social History   Socioeconomic History   Marital status: Single    Spouse name: Not on file   Number of children: Not on file   Years of education: Not on file   Highest education level: Not on file  Occupational History   Not on file  Tobacco Use   Smoking status: Never   Smokeless tobacco: Never  Vaping Use   Vaping Use: Never used  Substance and Sexual Activity   Alcohol use: Never   Drug use: Never   Sexual activity: Not Currently    Birth control/protection:  Pill  Other Topics Concern   Not on file  Social History Narrative   Not on file   Social Determinants of Health   Financial Resource Strain: Not on file  Food Insecurity: Not on file  Transportation Needs: Not on file  Physical Activity: Not on file  Stress: Not on file  Social Connections: Not on file   Outpatient Medications Prior to Visit  Medication Sig   albuterol (VENTOLIN HFA) 108 (90 Base) MCG/ACT inhaler Inhale into the lungs every 6 (six) hours as needed for wheezing or shortness of breath.   Cetirizine HCl 10 MG CAPS Take by mouth.   FLOVENT DISKUS 50 MCG/ACT AEPB Take 1 Inhalation by mouth every 12 (twelve) hours as needed.   JUNEL FE 24 1-20 MG-MCG(24) tablet TAKE 1 TABLET BY MOUTH DAILY   montelukast (SINGULAIR) 10 MG tablet Take by mouth.   Multiple Vitamin (MULTI VITAMIN DAILY PO) Take by mouth.   pantoprazole (PROTONIX) 40 MG tablet ONE TABLE 30 MINUTES PRIOR TO FIRST MEAL OF DAY   QVAR REDIHALER 40 MCG/ACT inhaler Inhale 1 puff into the lungs 2 (two) times daily.   EPINEPHrine 0.3 mg/0.3 mL IJ SOAJ injection SMARTSIG:0.3 Milliliter(s) IM Once PRN (Patient not taking: Reported on 04/12/2023)   [DISCONTINUED] niacin (VITAMIN B3) 250 MG tablet Take 250 mg by mouth at bedtime.   [DISCONTINUED] traMADol (ULTRAM) 50 MG tablet Take 1 tablet every 6 hours by oral route as needed, for pain. (Patient not taking: Reported on 03/16/2023)   No facility-administered medications prior to visit.   Allergies  Allergen Reactions   Aspirin Anaphylaxis and Shortness Of Breath   Ketorolac Anaphylaxis   Ketorolac Tromethamine Shortness Of Breath   Immunization History  Administered Date(s) Administered   Influenza-Unspecified 11/18/2018, 12/30/2019, 06/26/2021, 06/27/2022   PFIZER Comirnaty(Gray Top)Covid-19 Tri-Sucrose Vaccine 01/16/2021   PFIZER(Purple Top)SARS-COV-2 Vaccination 02/09/2020, 03/03/2020    Health Maintenance Due Health Maintenance Topics with due status:  Overdue     Topic Date Due   DTaP/Tdap/Td Never done   Zoster Vaccines- Shingrix Never done   Colonoscopy Never done   MAMMOGRAM Never done   COVID-19 Vaccine 07/15/2022    Review of Systems All review of systems negative except what is listed in the HPI   Objective    BP 126/80   Pulse 90   Ht 5' 5.25" (1.657 m)   Wt 224 lb 12.8 oz (102 kg)   LMP 04/05/2023   BMI 37.12 kg/m  Physical Exam Vitals and nursing note reviewed.  Constitutional:      General: She is not in acute distress.    Appearance: Normal appearance. She is not ill-appearing.  HENT:     Head: Normocephalic.  Eyes:     General: No scleral  icterus.    Conjunctiva/sclera: Conjunctivae normal.  Neck:     Vascular: No carotid bruit.  Cardiovascular:     Rate and Rhythm: Normal rate and regular rhythm.     Pulses: Normal pulses.     Heart sounds: Normal heart sounds. No murmur heard. Pulmonary:     Effort: Pulmonary effort is normal.     Breath sounds: Normal breath sounds.  Abdominal:     General: Bowel sounds are normal.     Palpations: Abdomen is soft.  Musculoskeletal:        General: Tenderness and signs of injury present.     Right lower leg: No edema.     Left lower leg: No edema.     Comments: Left shoulder in sling  Skin:    General: Skin is warm and dry.     Capillary Refill: Capillary refill takes less than 2 seconds.  Neurological:     General: No focal deficit present.     Mental Status: She is alert and oriented to person, place, and time.  Psychiatric:        Mood and Affect: Mood normal.     No results found for any visits on 04/12/23.  Assessment & Plan      Problem List Items Addressed This Visit     Iron deficiency anemia    Chronic significant iron deficiency anemia with hemoglobin levels down to 5.4 recently requiring multiple blood and iron transfusions.  Most recent labsapproximately 1 month ago showed improvement of hemoglobin to 11.  Despite treatment, heavy  menstrual cycles persist, contributing to an increased risk of ongoing anemic state.  Notably, the patient does not exhibit typical anemic symptoms, which poses a concern for the increased risk of recurrence without awareness.  Given the severity of her symptoms I strongly recommend frequent hemoglobin monitoring to ensure that treatment can be provided before levels are severe.  She does have concerns over potential damage to her cardiac function with suspected prolonged anemia.  This was mentioned to her by her hematologist.  She is interested in a cardiac referral today for evaluation. Plan: - Repeat CBC and iron panel in 1 month. -Referral placed for cardiology.      Menorrhagia with regular cycle    Chronic menorrhagia with extremely heavy menstrual cycles and recent diagnosis of iron deficiency anemia due to chronic blood loss.  Unfortunately it appears that her menorrhagia has caused quite significant anemia for an unknown period of time.  She has not had success with contraceptive management reducing her amount of bleeding.  We discussed possible ablation or hysterectomy given the severity of her symptoms however she is not interested in either of these procedures at this time. Plan: - Recommend ongoing discussion with gynecology about management of menorrhagia given the severity of anemia that has presented and the life-threatening risks associated with this.      Shoulder pain, left - Primary    Ongoing shoulder pain diagnosed as tendinitis or bursitis by orthopedics.  Previous treatment with 3 prednisone tapers and Tylenol provided temporary relief however once the medication was stopped the pain returned and has progressively worsened.  Unfortunately she has allergies to aspirin which has limited her treatment options.  She has not had imaging aside from x-ray has not undergone any physical therapy. Plan: Continue with Tylenol and ice pack use at this time. I have sent a referral to Dr.  Sherolyn Buba for further evaluation. Maintain gentle shoulder movements to prevent stiffness and  potential progression to full frozen shoulder      Relevant Orders   Ambulatory referral to Orthopedic Surgery   Other Visit Diagnoses     Severe anemia       Relevant Orders   Ambulatory referral to Cardiology   Intermittent palpitations       Relevant Orders   Ambulatory referral to Cardiology        Return for fall for CPE.      Dillin Lofgren, Sung Amabile, NP, DNP, AGNP-C [facility]

## 2023-04-12 NOTE — Patient Instructions (Addendum)
I have sent the referral to Dr. Steward Drone for your shoulder. They will call you to schedule. I have asked for this to be fast  I have also sent the referral to cardiology. They will call you to schedule.   I have information on shoulder pain and iron rich foods on the back of this sheet.   We will plan for repeat CBC in about 1 month or so.

## 2023-04-12 NOTE — Assessment & Plan Note (Signed)
Ongoing shoulder pain diagnosed as tendinitis or bursitis by orthopedics.  Previous treatment with 3 prednisone tapers and Tylenol provided temporary relief however once the medication was stopped the pain returned and has progressively worsened.  Unfortunately she has allergies to aspirin which has limited her treatment options.  She has not had imaging aside from x-ray has not undergone any physical therapy. Plan: Continue with Tylenol and ice pack use at this time. I have sent a referral to Dr. Sherolyn Buba for further evaluation. Maintain gentle shoulder movements to prevent stiffness and potential progression to full frozen shoulder

## 2023-04-12 NOTE — Assessment & Plan Note (Addendum)
Chronic significant iron deficiency anemia with hemoglobin levels down to 5.4 recently requiring multiple blood and iron transfusions.  Most recent labsapproximately 1 month ago showed improvement of hemoglobin to 11.  Despite treatment, heavy menstrual cycles persist, contributing to an increased risk of ongoing anemic state.  Notably, the patient does not exhibit typical anemic symptoms, which poses a concern for the increased risk of recurrence without awareness.  Given the severity of her symptoms I strongly recommend frequent hemoglobin monitoring to ensure that treatment can be provided before levels are severe.  She does have concerns over potential damage to her cardiac function with suspected prolonged anemia.  This was mentioned to her by her hematologist.  She is interested in a cardiac referral today for evaluation. Plan: - Repeat CBC and iron panel in 1 month. -Referral placed for cardiology.

## 2023-04-13 ENCOUNTER — Telehealth: Payer: Self-pay

## 2023-04-13 NOTE — Telephone Encounter (Addendum)
The new referral that is in the system is for Iron deficiency anemia (03/10/2023) and in the notes, it stated request for endoscopy and colonoscopy.   There is a referral for colonoscopy but that was 09/30/2022.

## 2023-04-13 NOTE — Telephone Encounter (Signed)
Pt left vmm to schedule colonoscopy please return call 

## 2023-04-14 ENCOUNTER — Other Ambulatory Visit: Payer: Self-pay

## 2023-04-14 ENCOUNTER — Encounter: Payer: Self-pay | Admitting: Internal Medicine

## 2023-04-14 DIAGNOSIS — D500 Iron deficiency anemia secondary to blood loss (chronic): Secondary | ICD-10-CM

## 2023-04-14 DIAGNOSIS — Z1211 Encounter for screening for malignant neoplasm of colon: Secondary | ICD-10-CM

## 2023-04-14 NOTE — Telephone Encounter (Signed)
Schedule patient colonoscopy and EgD for 05/04/2023 in [place]. Went over instructions. Mailed them, and sent to mychart. Sent prep to the pharmacy

## 2023-04-19 ENCOUNTER — Encounter: Payer: Self-pay | Admitting: Anesthesiology

## 2023-04-21 ENCOUNTER — Ambulatory Visit (INDEPENDENT_AMBULATORY_CARE_PROVIDER_SITE_OTHER): Payer: 59 | Admitting: Orthopaedic Surgery

## 2023-04-21 ENCOUNTER — Ambulatory Visit (HOSPITAL_BASED_OUTPATIENT_CLINIC_OR_DEPARTMENT_OTHER): Payer: 59

## 2023-04-21 DIAGNOSIS — M25512 Pain in left shoulder: Secondary | ICD-10-CM | POA: Diagnosis not present

## 2023-04-21 DIAGNOSIS — G8929 Other chronic pain: Secondary | ICD-10-CM

## 2023-04-21 MED ORDER — TRIAMCINOLONE ACETONIDE 40 MG/ML IJ SUSP
80.0000 mg | INTRAMUSCULAR | Status: AC | PRN
Start: 2023-04-21 — End: 2023-04-21
  Administered 2023-04-21: 80 mg via INTRA_ARTICULAR

## 2023-04-21 MED ORDER — LIDOCAINE HCL 1 % IJ SOLN
4.0000 mL | INTRAMUSCULAR | Status: AC | PRN
Start: 2023-04-21 — End: 2023-04-21
  Administered 2023-04-21: 4 mL

## 2023-04-21 NOTE — Progress Notes (Signed)
Chief Complaint: Pain     History of Present Illness:    Frances Rodgers is a 52 y.o. female presents with left shoulder pain atraumatically for 3 months patient had any specific injury.  She woke up 1 day with increasing pain.  She works from [employer].  She is having a hard time with overhead motion and reaching for objects    Surgical History:   none  PMH/PSH/Family History/Social History/Meds/Allergies:    Past Medical History:  Diagnosis Date  . Allergy   . Anemia   . Asthma   . Blood transfusion without reported diagnosis 2023  . GERD (gastroesophageal reflux disease)   . Seasonal allergies    Past Surgical History:  Procedure Laterality Date  . GASTRIC BYPASS  2015  . NASAL POLYP SURGERY  2010  . WISDOM TOOTH EXTRACTION     Social History   Socioeconomic History  . Marital status: Single    Spouse name: Not on file  . Number of children: Not on file  . Years of education: Not on file  . Highest education level: Not on file  Occupational History  . Not on file  Tobacco Use  . Smoking status: Never  . Smokeless tobacco: Never  Vaping Use  . Vaping Use: Never used  Substance and Sexual Activity  . Alcohol use: Never  . Drug use: Never  . Sexual activity: Not Currently    Birth control/protection: Pill  Other Topics Concern  . Not on file  Social History Narrative  . Not on file   Social Determinants of Health   Financial Resource Strain: Not on file  Food Insecurity: Not on file  Transportation Needs: Not on file  Physical Activity: Not on file  Stress: Not on file  Social Connections: Not on file   Family History  Problem Relation Age of Onset  . Diabetes Mother   . Stroke Mother   . Diabetes Brother   . Stroke Brother   . Pancreatic cancer Maternal Grandmother    Allergies  Allergen Reactions  . Aspirin Anaphylaxis and Shortness Of Breath  . Ketorolac Anaphylaxis  . Ketorolac Tromethamine Shortness Of  Breath   Current Outpatient Medications  Medication Sig Dispense Refill  . albuterol (VENTOLIN HFA) 108 (90 Base) MCG/ACT inhaler Inhale into the lungs every 6 (six) hours as needed for wheezing or shortness of breath.    . Cetirizine HCl 10 MG CAPS Take by mouth.    . EPINEPHrine 0.3 mg/0.3 mL IJ SOAJ injection SMARTSIG:0.3 Milliliter(s) IM Once PRN (Patient not taking: Reported on 04/12/2023)    . FLOVENT DISKUS 50 MCG/ACT AEPB Take 1 Inhalation by mouth every 12 (twelve) hours as needed.    . JUNEL FE 24 1-20 MG-MCG(24) tablet TAKE 1 TABLET BY MOUTH DAILY 84 tablet 3  . montelukast (SINGULAIR) 10 MG tablet Take by mouth.    . Multiple Vitamin (MULTI VITAMIN DAILY PO) Take by mouth.    . pantoprazole (PROTONIX) 40 MG tablet ONE TABLE 30 MINUTES PRIOR TO FIRST MEAL OF DAY    . QVAR REDIHALER 40 MCG/ACT inhaler Inhale 1 puff into the lungs 2 (two) times daily.     No current facility-administered medications for this visit.   No results found.  Review of Systems:   A ROS was performed including  pertinent positives and negatives as documented in the HPI.  Physical Exam :   Constitutional: NAD and appears stated age Neurological: Alert and oriented Psych: Appropriate affect and cooperative Last menstrual period 04/05/2023.   Comprehensive Musculoskeletal Exam:    Musculoskeletal Exam    Inspection Right Left  Skin No atrophy or winging No atrophy or winging  Palpation    Tenderness None Glenohumeral  Range of Motion    Flexion (passive) 170 170  Flexion (active) 170 170  Abduction 170 170  ER at the side 70 70  Can reach behind back to T12 T12  Strength     Full Full  Special Tests    Pseudoparalytic No No  Neurologic    Fires PIN, radial, median, ulnar, musculocutaneous, axillary, suprascapular, long thoracic, and spinal accessory innervated muscles. No abnormal sensibility  Vascular/Lymphatic    Radial Pulse 2+ 2+  Cervical Exam    Patient has symmetric cervical range  of motion with negative Spurling's test.  Special Test: Pain with passive external rotation     Imaging:   Xray (3 views left shoulder): Normal   I personally reviewed and interpreted the radiographs.   Assessment:   52 y.o. 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  At today's visit I recommend ultrasound-guided glenohumeral injection.  Will plan to proceed with this today.  I will plan to see her back in 2 weeks for possible additional injection.  Plan :    -Left shoulder glenohumeral injection provided after verbal consent obtained    Procedure Note  Patient: Frances Rodgers             Date of Birth: 1971/01/10           MRN: [id number]             Visit Date: 04/21/2023  Procedures: Visit Diagnoses:  1. Chronic left shoulder pain     Large Joint Inj: L glenohumeral on 04/21/2023 2:02 PM Indications: pain Details: 22 G 1.5 in needle, ultrasound-guided anterior approach  Arthrogram: No  Medications: 4 mL lidocaine 1 %; 80 mg triamcinolone acetonide 40 MG/ML Outcome: tolerated well, no immediate complications Procedure, treatment alternatives, risks and benefits explained, specific risks discussed. Consent was given by the patient. Immediately prior to procedure a time out was called to verify the correct patient, procedure, equipment, support staff and site/side marked as required. Patient was prepped and draped in the usual sterile fashion.          I personally saw and evaluated the patient, and participated in the management and treatment plan.  Huel Cote, MD Attending Physician, Orthopedic Surgery  This document was dictated using Dragon voice recognition software. A reasonable attempt at proof reading has been made to minimize errors.

## 2023-04-28 ENCOUNTER — Other Ambulatory Visit (HOSPITAL_BASED_OUTPATIENT_CLINIC_OR_DEPARTMENT_OTHER): Payer: Self-pay | Admitting: Orthopaedic Surgery

## 2023-04-28 ENCOUNTER — Encounter (HOSPITAL_BASED_OUTPATIENT_CLINIC_OR_DEPARTMENT_OTHER): Payer: Self-pay | Admitting: Orthopaedic Surgery

## 2023-04-28 DIAGNOSIS — G8929 Other chronic pain: Secondary | ICD-10-CM

## 2023-04-28 NOTE — Telephone Encounter (Signed)
Frozen shoulder injected 1 week ago (04/21/23)

## 2023-05-04 ENCOUNTER — Ambulatory Visit: Admission: RE | Admit: 2023-05-04 | Payer: 59 | Source: Home / Self Care | Admitting: Gastroenterology

## 2023-05-04 ENCOUNTER — Encounter: Admission: RE | Payer: Self-pay | Source: Home / Self Care

## 2023-05-04 SURGERY — COLONOSCOPY WITH PROPOFOL
Anesthesia: Choice

## 2023-05-05 ENCOUNTER — Encounter (HOSPITAL_BASED_OUTPATIENT_CLINIC_OR_DEPARTMENT_OTHER): Payer: Self-pay | Admitting: Orthopaedic Surgery

## 2023-05-05 ENCOUNTER — Ambulatory Visit (INDEPENDENT_AMBULATORY_CARE_PROVIDER_SITE_OTHER): Payer: 59 | Admitting: Orthopaedic Surgery

## 2023-05-05 DIAGNOSIS — M25512 Pain in left shoulder: Secondary | ICD-10-CM

## 2023-05-05 DIAGNOSIS — G8929 Other chronic pain: Secondary | ICD-10-CM

## 2023-05-05 MED ORDER — LIDOCAINE HCL 1 % IJ SOLN
4.0000 mL | INTRAMUSCULAR | Status: AC | PRN
Start: 2023-05-05 — End: 2023-05-05
  Administered 2023-05-05: 4 mL

## 2023-05-05 MED ORDER — TRIAMCINOLONE ACETONIDE 40 MG/ML IJ SUSP
80.0000 mg | INTRAMUSCULAR | Status: AC | PRN
Start: 2023-05-05 — End: 2023-05-05
  Administered 2023-05-05: 80 mg via INTRA_ARTICULAR

## 2023-05-05 NOTE — Progress Notes (Signed)
Chief Complaint: Pain     History of Present Illness:   05/05/2023: Female presents today for follow-up of her left shoulder.  Overall she did get extremely good improvement in her range of motion from the left shoulder injection although her pain is still persistent.  She has been requiring a sling.  She is pending an MRI of the left shoulder.  Frances Rodgers is a 52 y.o. female presents with left shoulder pain atraumatically for 3 months patient had any specific injury.  She woke up 1 day with increasing pain.  She works from [employer].  She is having a hard time with overhead motion and reaching for objects    Surgical History:   none  PMH/PSH/Family History/Social History/Meds/Allergies:    Past Medical History:  Diagnosis Date   Allergy    Anemia    Asthma    Blood transfusion without reported diagnosis 2023   GERD (gastroesophageal reflux disease)    Seasonal allergies    Past Surgical History:  Procedure Laterality Date   GASTRIC BYPASS  2015   NASAL POLYP SURGERY  2010   WISDOM TOOTH EXTRACTION     Social History   Socioeconomic History   Marital status: Single    Spouse name: Not on file   Number of children: Not on file   Years of education: Not on file   Highest education level: Not on file  Occupational History   Not on file  Tobacco Use   Smoking status: Never   Smokeless tobacco: Never  Vaping Use   Vaping Use: Never used  Substance and Sexual Activity   Alcohol use: Never   Drug use: Never   Sexual activity: Not Currently    Birth control/protection: Pill  Other Topics Concern   Not on file  Social History Narrative   Not on file   Social Determinants of Health   Financial Resource Strain: Not on file  Food Insecurity: Not on file  Transportation Needs: Not on file  Physical Activity: Not on file  Stress: Not on file  Social Connections: Not on file   Family History  Problem Relation Age of Onset    Diabetes Mother    Stroke Mother    Diabetes Brother    Stroke Brother    Pancreatic cancer Maternal Grandmother    Allergies  Allergen Reactions   Aspirin Anaphylaxis and Shortness Of Breath   Ketorolac Anaphylaxis   Ketorolac Tromethamine Shortness Of Breath   Current Outpatient Medications  Medication Sig Dispense Refill   albuterol (VENTOLIN HFA) 108 (90 Base) MCG/ACT inhaler Inhale into the lungs every 6 (six) hours as needed for wheezing or shortness of breath.     Cetirizine HCl 10 MG CAPS Take by mouth.     EPINEPHrine 0.3 mg/0.3 mL IJ SOAJ injection SMARTSIG:0.3 Milliliter(s) IM Once PRN (Patient not taking: Reported on 04/12/2023)     FLOVENT DISKUS 50 MCG/ACT AEPB Take 1 Inhalation by mouth every 12 (twelve) hours as needed.     JUNEL FE 24 1-20 MG-MCG(24) tablet TAKE 1 TABLET BY MOUTH DAILY 84 tablet 3   montelukast (SINGULAIR) 10 MG tablet Take by mouth.     Multiple Vitamin (MULTI VITAMIN DAILY PO) Take by mouth.     pantoprazole (PROTONIX) 40 MG tablet ONE TABLE 30 MINUTES PRIOR  TO FIRST MEAL OF DAY     QVAR REDIHALER 40 MCG/ACT inhaler Inhale 1 puff into the lungs 2 (two) times daily.     No current facility-administered medications for this visit.   No results found.  Review of Systems:   A ROS was performed including pertinent positives and negatives as documented in the HPI.  Physical Exam :   Constitutional: NAD and appears stated age Neurological: Alert and oriented Psych: Appropriate affect and cooperative Last menstrual period 04/05/2023.   Comprehensive Musculoskeletal Exam:    Musculoskeletal Exam    Inspection Right Left  Skin No atrophy or winging No atrophy or winging  Palpation    Tenderness None Glenohumeral  Range of Motion    Flexion (passive) 170 170  Flexion (active) 170 170  Abduction 170 170  ER at the side 70 70  Can reach behind back to T12 T12  Strength     Full Full  Special Tests    Pseudoparalytic No No  Neurologic     Fires PIN, radial, median, ulnar, musculocutaneous, axillary, suprascapular, long thoracic, and spinal accessory innervated muscles. No abnormal sensibility  Vascular/Lymphatic    Radial Pulse 2+ 2+  Cervical Exam    Patient has symmetric cervical range of motion with negative Spurling's test.  Special Test: Pain with passive external rotation     Imaging:   Xray (3 views left shoulder): Normal   I personally reviewed and interpreted the radiographs.   Assessment:   52 y.o. 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  At today's visit I did recommend an additional ultrasound guided injection in order to hopefully get her some pain relief.  I will plan to see her back following her MRI for further discussion Plan :    -Left shoulder glenohumeral injection provided after verbal consent obtained    Procedure Note  Patient: Frances Rodgers             Date of Birth: 09-17-1971           MRN: [id number]             Visit Date: 05/05/2023  Procedures: Visit Diagnoses:  No diagnosis found.   Large Joint Inj: L glenohumeral on 05/05/2023 11:57 AM Indications: pain Details: 22 G 1.5 in needle, ultrasound-guided anterior approach  Arthrogram: No  Medications: 4 mL lidocaine 1 %; 80 mg triamcinolone acetonide 40 MG/ML Outcome: tolerated well, no immediate complications Procedure, treatment alternatives, risks and benefits explained, specific risks discussed. Consent was given by the patient. Immediately prior to procedure a time out was called to verify the correct patient, procedure, equipment, support staff and site/side marked as required. Patient was prepped and draped in the usual sterile fashion.           I personally saw and evaluated the patient, and participated in the management and treatment plan.  Huel Cote, MD Attending Physician, Orthopedic Surgery  This document was dictated using Dragon voice recognition software. A  reasonable attempt at proof reading has been made to minimize errors.

## 2023-05-11 ENCOUNTER — Encounter: Payer: Self-pay | Admitting: Nurse Practitioner

## 2023-05-12 ENCOUNTER — Other Ambulatory Visit (HOSPITAL_BASED_OUTPATIENT_CLINIC_OR_DEPARTMENT_OTHER): Payer: Self-pay | Admitting: Orthopaedic Surgery

## 2023-05-12 MED ORDER — METHYLPREDNISOLONE 4 MG PO TBPK
ORAL_TABLET | ORAL | 0 refills | Status: DC
Start: 2023-05-12 — End: 2023-06-01

## 2023-05-16 ENCOUNTER — Encounter: Payer: Self-pay | Admitting: Obstetrics and Gynecology

## 2023-05-16 ENCOUNTER — Ambulatory Visit
Admission: RE | Admit: 2023-05-16 | Discharge: 2023-05-16 | Disposition: A | Discharge status: Home / Self Care | Payer: 59 | Source: Ambulatory Visit | Attending: Orthopaedic Surgery | Admitting: Orthopaedic Surgery

## 2023-05-16 DIAGNOSIS — G8929 Other chronic pain: Secondary | ICD-10-CM

## 2023-05-18 ENCOUNTER — Encounter: Payer: Self-pay | Admitting: Internal Medicine

## 2023-05-19 ENCOUNTER — Other Ambulatory Visit: Payer: 59

## 2023-05-19 ENCOUNTER — Other Ambulatory Visit: Payer: Self-pay | Admitting: Nurse Practitioner

## 2023-05-19 DIAGNOSIS — M259 Joint disorder, unspecified: Secondary | ICD-10-CM

## 2023-05-24 ENCOUNTER — Telehealth: Payer: Self-pay

## 2023-05-24 NOTE — Telephone Encounter (Signed)
Attempt to call patient to obtain cardiac history.  No answer and no option to leave VM.

## 2023-05-27 ENCOUNTER — Encounter: Payer: Self-pay | Admitting: Internal Medicine

## 2023-05-27 ENCOUNTER — Emergency Department: Payer: 59

## 2023-05-27 ENCOUNTER — Inpatient Hospital Stay
Admission: EM | Admit: 2023-05-27 | Discharge: 2023-06-01 | DRG: 637 | Disposition: A | Discharge status: Home / Self Care | Payer: 59 | Attending: Hospitalist | Admitting: Hospitalist

## 2023-05-27 DIAGNOSIS — M7502 Adhesive capsulitis of left shoulder: Secondary | ICD-10-CM | POA: Diagnosis present

## 2023-05-27 DIAGNOSIS — Z823 Family history of stroke: Secondary | ICD-10-CM | POA: Diagnosis not present

## 2023-05-27 DIAGNOSIS — E875 Hyperkalemia: Secondary | ICD-10-CM | POA: Diagnosis present

## 2023-05-27 DIAGNOSIS — N179 Acute kidney failure, unspecified: Secondary | ICD-10-CM

## 2023-05-27 DIAGNOSIS — J45909 Unspecified asthma, uncomplicated: Secondary | ICD-10-CM | POA: Diagnosis present

## 2023-05-27 DIAGNOSIS — Z833 Family history of diabetes mellitus: Secondary | ICD-10-CM | POA: Diagnosis not present

## 2023-05-27 DIAGNOSIS — K219 Gastro-esophageal reflux disease without esophagitis: Secondary | ICD-10-CM

## 2023-05-27 DIAGNOSIS — Z886 Allergy status to analgesic agent status: Secondary | ICD-10-CM

## 2023-05-27 DIAGNOSIS — E870 Hyperosmolality and hypernatremia: Secondary | ICD-10-CM | POA: Diagnosis present

## 2023-05-27 DIAGNOSIS — E1311 Other specified diabetes mellitus with ketoacidosis with coma: Principal | ICD-10-CM

## 2023-05-27 DIAGNOSIS — M7581 Other shoulder lesions, right shoulder: Secondary | ICD-10-CM | POA: Diagnosis present

## 2023-05-27 DIAGNOSIS — Z9884 Bariatric surgery status: Secondary | ICD-10-CM | POA: Diagnosis not present

## 2023-05-27 DIAGNOSIS — N390 Urinary tract infection, site not specified: Secondary | ICD-10-CM | POA: Diagnosis present

## 2023-05-27 DIAGNOSIS — E1110 Type 2 diabetes mellitus with ketoacidosis without coma: Secondary | ICD-10-CM

## 2023-05-27 DIAGNOSIS — D649 Anemia, unspecified: Secondary | ICD-10-CM | POA: Diagnosis present

## 2023-05-27 DIAGNOSIS — R030 Elevated blood-pressure reading, without diagnosis of hypertension: Secondary | ICD-10-CM | POA: Diagnosis present

## 2023-05-27 DIAGNOSIS — E860 Dehydration: Secondary | ICD-10-CM | POA: Diagnosis present

## 2023-05-27 DIAGNOSIS — Z6837 Body mass index (BMI) 37.0-37.9, adult: Secondary | ICD-10-CM | POA: Diagnosis not present

## 2023-05-27 DIAGNOSIS — Z800 Family history of malignant neoplasm of digestive organs: Secondary | ICD-10-CM | POA: Diagnosis not present

## 2023-05-27 DIAGNOSIS — R8290 Unspecified abnormal findings in urine: Secondary | ICD-10-CM | POA: Diagnosis present

## 2023-05-27 DIAGNOSIS — G8929 Other chronic pain: Secondary | ICD-10-CM | POA: Diagnosis present

## 2023-05-27 DIAGNOSIS — D75839 Thrombocytosis, unspecified: Secondary | ICD-10-CM | POA: Diagnosis present

## 2023-05-27 DIAGNOSIS — Z888 Allergy status to other drugs, medicaments and biological substances status: Secondary | ICD-10-CM

## 2023-05-27 DIAGNOSIS — R338 Other retention of urine: Secondary | ICD-10-CM | POA: Diagnosis not present

## 2023-05-27 DIAGNOSIS — R7989 Other specified abnormal findings of blood chemistry: Secondary | ICD-10-CM | POA: Diagnosis not present

## 2023-05-27 DIAGNOSIS — J4520 Mild intermittent asthma, uncomplicated: Secondary | ICD-10-CM

## 2023-05-27 DIAGNOSIS — M25512 Pain in left shoulder: Secondary | ICD-10-CM | POA: Diagnosis present

## 2023-05-27 DIAGNOSIS — Z1152 Encounter for screening for COVID-19: Secondary | ICD-10-CM | POA: Diagnosis not present

## 2023-05-27 DIAGNOSIS — G9341 Metabolic encephalopathy: Secondary | ICD-10-CM | POA: Diagnosis present

## 2023-05-27 DIAGNOSIS — E669 Obesity, unspecified: Secondary | ICD-10-CM | POA: Diagnosis present

## 2023-05-27 DIAGNOSIS — R410 Disorientation, unspecified: Secondary | ICD-10-CM | POA: Diagnosis present

## 2023-05-27 DIAGNOSIS — A419 Sepsis, unspecified organism: Secondary | ICD-10-CM | POA: Diagnosis not present

## 2023-05-27 DIAGNOSIS — Z79899 Other long term (current) drug therapy: Secondary | ICD-10-CM | POA: Diagnosis not present

## 2023-05-27 DIAGNOSIS — E0810 Diabetes mellitus due to underlying condition with ketoacidosis without coma: Secondary | ICD-10-CM

## 2023-05-27 HISTORY — DX: Type 2 diabetes mellitus with ketoacidosis without coma: E11.10

## 2023-05-27 HISTORY — DX: Acute kidney failure, unspecified: N17.9

## 2023-05-27 LAB — COMPREHENSIVE METABOLIC PANEL
ALT: 14 U/L (ref 0–44)
AST: 17 U/L (ref 15–41)
Albumin: 3.4 g/dL — ABNORMAL LOW (ref 3.5–5.0)
Alkaline Phosphatase: 114 U/L (ref 38–126)
Anion gap: 31 — ABNORMAL HIGH (ref 5–15)
BUN: 90 mg/dL — ABNORMAL HIGH (ref 6–20)
CO2: 8 mmol/L — ABNORMAL LOW (ref 22–32)
Calcium: 10.8 mg/dL — ABNORMAL HIGH (ref 8.9–10.3)
Chloride: 101 mmol/L (ref 98–111)
Creatinine, Ser: 3.01 mg/dL — ABNORMAL HIGH (ref 0.44–1.00)
GFR, Estimated: 18 mL/min — ABNORMAL LOW (ref >60)
Glucose, Bld: 1055 mg/dL (ref 70–99)
Potassium: 7 mmol/L (ref 3.5–5.1)
Sodium: 140 mmol/L (ref 135–145)
Total Bilirubin: 2.3 mg/dL — ABNORMAL HIGH (ref 0.3–1.2)
Total Protein: 9 g/dL — ABNORMAL HIGH (ref 6.5–8.1)

## 2023-05-27 LAB — CBC
HCT: 43.8 % (ref 36.0–46.0)
Hemoglobin: 12.3 g/dL (ref 12.0–15.0)
MCH: 23.9 pg — ABNORMAL LOW (ref 26.0–34.0)
MCHC: 28.1 g/dL — ABNORMAL LOW (ref 30.0–36.0)
MCV: 85 fL (ref 80.0–100.0)
Platelets: 438 10*3/uL — ABNORMAL HIGH (ref 150–400)
RBC: 5.15 MIL/uL — ABNORMAL HIGH (ref 3.87–5.11)
RDW: 16.4 % — ABNORMAL HIGH (ref 11.5–15.5)
WBC: 15.4 10*3/uL — ABNORMAL HIGH (ref 4.0–10.5)
nRBC: 0.3 % — ABNORMAL HIGH (ref 0.0–0.2)

## 2023-05-27 LAB — BLOOD GAS, VENOUS
Acid-base deficit: 19.2 mmol/L — ABNORMAL HIGH (ref 0.0–2.0)
Bicarbonate: 7.1 mmol/L — ABNORMAL LOW (ref 20.0–28.0)
O2 Saturation: 80.5 %
Patient temperature: 37
pCO2, Ven: 19 mmHg — CL (ref 44–60)
pH, Ven: 7.18 — CL (ref 7.25–7.43)
pO2, Ven: 51 mmHg — ABNORMAL HIGH (ref 32–45)

## 2023-05-27 LAB — CBG MONITORING, ED: Glucose-Capillary: >600 mg/dL (ref 70–99)

## 2023-05-27 MED ORDER — SODIUM ZIRCONIUM CYCLOSILICATE 5 G PO PACK
10.0000 g | PACK | Freq: Once | ORAL | Status: DC
  Filled 2023-05-27: qty 1

## 2023-05-27 MED ORDER — ENOXAPARIN SODIUM 40 MG/0.4ML IJ SOSY: 40.0000 mg | PREFILLED_SYRINGE | INTRAMUSCULAR | Status: DC

## 2023-05-27 MED ORDER — LACTATED RINGERS IV BOLUS: 20.0000 mL/kg | Freq: Once | INTRAVENOUS | Status: DC

## 2023-05-27 MED ORDER — CHLORHEXIDINE GLUCONATE CLOTH 2 % EX PADS: 6.0000 | MEDICATED_PAD | Freq: Every day | CUTANEOUS | Status: DC

## 2023-05-27 MED ORDER — DEXTROSE IN LACTATED RINGERS 5 % IV SOLN: INTRAVENOUS | Status: DC

## 2023-05-27 MED ORDER — PANTOPRAZOLE SODIUM 40 MG PO TBEC
40.0000 mg | DELAYED_RELEASE_TABLET | Freq: Every day | ORAL | Status: DC
  Administered 2023-05-29 – 2023-06-01 (×4): 40 mg via ORAL
  Filled 2023-05-27 (×4): qty 1

## 2023-05-27 MED ORDER — LACTATED RINGERS IV SOLN: INTRAVENOUS | Status: DC

## 2023-05-27 MED ORDER — ALBUTEROL SULFATE HFA 108 (90 BASE) MCG/ACT IN AERS: 2.0000 | INHALATION_SPRAY | Freq: Four times a day (QID) | RESPIRATORY_TRACT | Status: DC | PRN

## 2023-05-27 MED ORDER — DEXTROSE 50 % IV SOLN: 0.0000 mL | INTRAVENOUS | Status: DC | PRN

## 2023-05-27 MED ORDER — ENOXAPARIN SODIUM 60 MG/0.6ML IJ SOSY
50.0000 mg | PREFILLED_SYRINGE | INTRAMUSCULAR | Status: DC
  Administered 2023-05-28 – 2023-06-01 (×5): 50 mg via SUBCUTANEOUS
  Filled 2023-05-27 (×6): qty 0.6

## 2023-05-27 MED ORDER — ACETAMINOPHEN 650 MG RE SUPP: 650.0000 mg | Freq: Four times a day (QID) | RECTAL | Status: DC | PRN

## 2023-05-27 MED ORDER — ONDANSETRON HCL 4 MG/2ML IJ SOLN: 4.0000 mg | Freq: Four times a day (QID) | INTRAMUSCULAR | Status: DC | PRN

## 2023-05-27 MED ORDER — ONDANSETRON HCL 4 MG PO TABS: 4.0000 mg | ORAL_TABLET | Freq: Four times a day (QID) | ORAL | Status: DC | PRN

## 2023-05-27 MED ORDER — CALCIUM GLUCONATE-NACL 1-0.675 GM/50ML-% IV SOLN
1.0000 g | Freq: Once | INTRAVENOUS | Status: AC
  Administered 2023-05-27: 1000 mg via INTRAVENOUS
  Filled 2023-05-27: qty 50

## 2023-05-27 MED ORDER — INSULIN REGULAR(HUMAN) IN NACL 100-0.9 UT/100ML-% IV SOLN
INTRAVENOUS | Status: DC
  Administered 2023-05-27: 5 [IU]/h via INTRAVENOUS
  Administered 2023-05-28: 4 [IU]/h via INTRAVENOUS
  Filled 2023-05-27 (×2): qty 100

## 2023-05-27 MED ORDER — TRAZODONE HCL 50 MG PO TABS
25.0000 mg | ORAL_TABLET | Freq: Every evening | ORAL | Status: DC | PRN
  Administered 2023-05-29 – 2023-05-30 (×2): 25 mg via ORAL
  Filled 2023-05-27 (×2): qty 1

## 2023-05-27 MED ORDER — BUDESONIDE 0.25 MG/2ML IN SUSP
0.2500 mg | Freq: Two times a day (BID) | RESPIRATORY_TRACT | Status: DC
  Administered 2023-05-28 – 2023-05-30 (×5): 0.25 mg via RESPIRATORY_TRACT
  Filled 2023-05-27 (×5): qty 2

## 2023-05-27 MED ORDER — ADULT MULTIVITAMIN W/MINERALS CH
ORAL_TABLET | Freq: Every day | ORAL | Status: DC
  Administered 2023-05-29 – 2023-06-01 (×3): 1 via ORAL
  Filled 2023-05-27 (×3): qty 1

## 2023-05-27 MED ORDER — LORATADINE 10 MG PO TABS
10.0000 mg | ORAL_TABLET | Freq: Every day | ORAL | Status: DC
  Administered 2023-05-29 – 2023-06-01 (×4): 10 mg via ORAL
  Filled 2023-05-27 (×4): qty 1

## 2023-05-27 MED ORDER — SODIUM CHLORIDE 0.9 % IV BOLUS
1000.0000 mL | Freq: Once | INTRAVENOUS | Status: AC
  Administered 2023-05-27: 1000 mL via INTRAVENOUS

## 2023-05-27 MED ORDER — MAGNESIUM HYDROXIDE 400 MG/5ML PO SUSP: 30.0000 mL | Freq: Every day | ORAL | Status: DC | PRN

## 2023-05-27 MED ORDER — MONTELUKAST SODIUM 10 MG PO TABS
10.0000 mg | ORAL_TABLET | Freq: Every day | ORAL | Status: DC
  Administered 2023-05-29 – 2023-05-31 (×3): 10 mg via ORAL
  Filled 2023-05-27 (×3): qty 1

## 2023-05-27 MED ORDER — ALBUTEROL SULFATE (2.5 MG/3ML) 0.083% IN NEBU
2.5000 mg | INHALATION_SOLUTION | Freq: Four times a day (QID) | RESPIRATORY_TRACT | Status: DC | PRN
  Administered 2023-05-28: 2.5 mg via RESPIRATORY_TRACT
  Filled 2023-05-27: qty 3

## 2023-05-27 MED ORDER — BECLOMETHASONE DIPROP HFA 40 MCG/ACT IN AERB: 1.0000 | INHALATION_SPRAY | Freq: Two times a day (BID) | RESPIRATORY_TRACT | Status: DC

## 2023-05-27 MED ORDER — ACETAMINOPHEN 325 MG PO TABS
650.0000 mg | ORAL_TABLET | Freq: Four times a day (QID) | ORAL | Status: DC | PRN
  Administered 2023-05-29 – 2023-05-30 (×2): 650 mg via ORAL
  Filled 2023-05-27 (×2): qty 2

## 2023-05-27 MED ORDER — INSULIN REGULAR(HUMAN) IN NACL 100-0.9 UT/100ML-% IV SOLN: INTRAVENOUS | Status: DC

## 2023-05-27 NOTE — Assessment & Plan Note (Addendum)
Cr on admission 3.01, baseline normal. Renal function improving with hydration. Cr today 1.49 --Monitor BMP --Avoid nephrotoxins, hypotension --Renally dose meds

## 2023-05-27 NOTE — H&P (Signed)
[place]   PATIENT NAME: Frances Rodgers    MR#:  [id number]  DATE OF BIRTH:  [birth year]  DATE OF ADMISSION:  05/27/2023  PRIMARY CARE PHYSICIAN: Tollie Eth, NP   Patient is coming from: Home  REQUESTING/REFERRING PHYSICIAN: Minna Antis, MD  CHIEF COMPLAINT:   Chief Complaint  Patient presents with   unresponsive    HISTORY OF PRESENT ILLNESS:  Frances Rodgers is a 52 y.o. female with medical history significant for asthma, and GERD, who presented to the emergency room with acute onset of altered mental status with unresponsiveness.  The patient's family last talked to her around 5 PM last night.  When she did not respond they went to her home.  They found her with her pants around the ankle and she was fairly confused upon arrival of EMS with tachypnea.  When she came to the ER she was awake and alert but not responding much.  She was fairly somnolent and fatigued.  Blood glucose was noted to be more than 600 on arrival and she has no previous history of diabetes mellitus.  ED Course: Vital signs upon arrival to the ER revealed a heart rate of 107, respiratory rate of 28 with temperature of 99 with otherwise normal vital signs.  Labs revealed cytosis of 15.4 with thrombocytosis of 435.  Blood glucose was 1055 and potassium 7 with a BUN of 19 creatinine 3.01, calcium 10.8 and anion gap of 31 with previous normal renal functions.  Total protein was 9 and albumin 3.4 with total bili of 2.3 and GFR of 18. EKG as reviewed by me : Twelve-lead EKG showed likely sinus tachycardia with a rate of 109 with right atrial enlargement and right ventricle hypertrophy Imaging: Noncontrasted head CT scan revealed no acute intracranial abnormalities.  The patient was given 2 L bolus of IV normal saline and was placed on IV insulin drip per Endo tool with DKA protocol.  Her mental status has been improving with hydration.  She will be admitted to stepdown unit bed for further evaluation  and management. PAST MEDICAL HISTORY:   Past Medical History:  Diagnosis Date   Allergy    Anemia    Asthma    Blood transfusion without reported diagnosis 2023   GERD (gastroesophageal reflux disease)    Seasonal allergies     PAST SURGICAL HISTORY:   Past Surgical History:  Procedure Laterality Date   GASTRIC BYPASS  2015   NASAL POLYP SURGERY  2010   WISDOM TOOTH EXTRACTION      SOCIAL HISTORY:   Social History   Tobacco Use   Smoking status: Never   Smokeless tobacco: Never  Substance Use Topics   Alcohol use: Never    FAMILY HISTORY:   Family History  Problem Relation Age of Onset   Diabetes Mother    Stroke Mother    Diabetes Brother    Stroke Brother    Pancreatic cancer Maternal Grandmother     DRUG ALLERGIES:   Allergies  Allergen Reactions   Aspirin Anaphylaxis and Shortness Of Breath   Ketorolac Anaphylaxis   Ketorolac Tromethamine Shortness Of Breath    REVIEW OF SYSTEMS:   ROS As per history of present illness. All pertinent systems were reviewed above. Constitutional, HEENT, cardiovascular, respiratory, GI, GU, musculoskeletal, neuro, psychiatric, endocrine, integumentary and hematologic systems were reviewed and are otherwise negative/unremarkable except for positive findings mentioned above in the HPI.   MEDICATIONS AT HOME:   Prior  to Admission medications   Medication Sig Start Date End Date Taking? Authorizing Provider  albuterol (VENTOLIN HFA) 108 (90 Base) MCG/ACT inhaler Inhale into the lungs every 6 (six) hours as needed for wheezing or shortness of breath.   Yes [provider]  Cetirizine HCl 10 MG CAPS Take 10 mg by mouth daily as needed. 06/02/03  Yes [provider]  FLOVENT DISKUS 50 MCG/ACT AEPB Take 1 Inhalation by mouth every 12 (twelve) hours as needed.   Yes [provider]  JUNEL FE 24 1-20 MG-MCG(24) tablet TAKE 1 TABLET BY MOUTH DAILY 02/14/23  Yes Hildred Laser, MD  methylPREDNISolone  (MEDROL DOSEPAK) 4 MG TBPK tablet Take per packet instructions Patient not taking: Reported on 05/27/2023 05/12/23   Huel Cote, MD  montelukast (SINGULAIR) 10 MG tablet Take 10 mg by mouth at bedtime.   Yes [provider]  Multiple Vitamin (MULTI VITAMIN DAILY PO) Take 1 tablet by mouth daily.   Yes [provider]  pantoprazole (PROTONIX) 40 MG tablet ONE TABLE 30 MINUTES PRIOR TO FIRST MEAL OF DAY 04/12/19  Yes [provider]  QVAR REDIHALER 40 MCG/ACT inhaler Inhale 1 puff into the lungs 2 (two) times daily. 12/09/22  Yes [provider]  EPINEPHrine 0.3 mg/0.3 mL IJ SOAJ injection SMARTSIG:0.3 Milliliter(s) IM Once PRN Patient not taking: Reported on 05/27/2023 06/09/21   [provider]      VITAL SIGNS:  Blood pressure 111/89, pulse (!) 106, temperature 99 F (37.2 C), temperature source Axillary, resp. rate (!) 31, height 5\' 5"  (1.651 m), weight 102 kg, SpO2 99%.  PHYSICAL EXAMINATION:  Physical Exam  GENERAL:  52 y.o.-year-old female patient lying in the bed with no acute distress.  EYES: Pupils equal, round, reactive to light and accommodation. No scleral icterus. Extraocular muscles intact.  HEENT: Head atraumatic, normocephalic. Oropharynx with dry mucous membrane and tongue and nasopharynx clear.  NECK:  Supple, no jugular venous distention. No thyroid enlargement, no tenderness.  LUNGS: Normal breath sounds bilaterally, no wheezing, rales,rhonchi or crepitation. No use of accessory muscles of respiration.  CARDIOVASCULAR: Regular rate and rhythm, S1, S2 normal. No murmurs, rubs, or gallops.  ABDOMEN: Soft, nondistended, nontender. Bowel sounds present. No organomegaly or mass.  EXTREMITIES: No pedal edema, cyanosis, or clubbing.  NEUROLOGIC: Cranial nerves II through XII are intact. Muscle strength 5/5 in all extremities. Sensation intact. Gait not checked.  PSYCHIATRIC: The patient is alert and oriented x 3.  Normal affect and  good eye contact. SKIN: No obvious rash, lesion, or ulcer.   LABORATORY PANEL:   CBC Recent Labs  Lab 05/27/23 2056  WBC 15.4*  HGB 12.3  HCT 43.8  PLT 438*   ------------------------------------------------------------------------------------------------------------------  Chemistries  Recent Labs  Lab 05/27/23 2056  NA 140  K 7.0*  CL 101  CO2 8*  GLUCOSE 1,055*  BUN 90*  CREATININE 3.01*  CALCIUM 10.8*  AST 17  ALT 14  ALKPHOS 114  BILITOT 2.3*   ------------------------------------------------------------------------------------------------------------------  Cardiac Enzymes No results for input(s): "TROPONINI" in the last 168 hours. ------------------------------------------------------------------------------------------------------------------  RADIOLOGY:  CT HEAD WO CONTRAST ( )  Result Date: 05/27/2023 CLINICAL DATA:  Unresponsive EXAM: CT HEAD WITHOUT CONTRAST TECHNIQUE: Contiguous axial images were obtained from the base of the skull through the vertex without intravenous contrast. RADIATION DOSE REDUCTION: This exam was performed according to the departmental dose-optimization program which includes automated exposure control, adjustment of the mA and/or kV according to patient size and/or use of iterative reconstruction technique.  COMPARISON:  None Available. FINDINGS: Brain: No acute territorial infarction, hemorrhage or intracranial mass. The ventricles are nonenlarged. Vascular: No hyperdense vessel or unexpected calcification. Skull: Normal. Negative for fracture or focal lesion. Sinuses/Orbits: No acute finding. Mucosal thickening and postsurgical changes at the maxillary sinuses. Other: None IMPRESSION: Negative.  No CT evidence for acute intracranial abnormality Electronically Signed   By: Jasmine Pang M.D.   On: 05/27/2023 21:53      IMPRESSION AND PLAN:  Assessment and Plan: * DKA (diabetic ketoacidosis) (HCC) - This associated with new onset  diabetes mellitus likely type II. - The patient will be admitted to a stepdown bed. - We will continue the on IV insulin drip per EndoTool DKA protocol. - The patient will be aggressively hydrated with IV normal saline. - Will follow serial BMPs.    Hyperkalemia - This is checked with acute kidney injury. - She will be given 10 g p.o. Lokelma and 1 g of IV calcium gluconate. - We will follow serial BMPs.  AKI (acute kidney injury) (HCC) - This like secondary to volume depletion and dehydration. - We will continue hydration as mentioned above and follow serial BMPs. - Would avoid nephrotoxins.  Asthma - She has no current exacerbation. - We will continue her Qvar and Singulair as well as place her on as needed albuterol.  GERD without esophagitis - We will continue PPI therapy.   DVT prophylaxis: Lovenox.  Advanced Care Planning:  Code Status: full code.  Family Communication:  The plan of care was discussed in details with the patient (and family). I answered all questions. The patient agreed to proceed with the above mentioned plan. Further management will depend upon hospital course. Disposition Plan: Back to previous home environment Consults called: none.  All the records are reviewed and case discussed with ED provider.  Status is: Inpatient  At the time of the admission, it appears that the appropriate admission status for this patient is inpatient.  This is judged to be reasonable and necessary in order to provide the required intensity of service to ensure the patient's safety given the presenting symptoms, physical exam findings and initial radiographic and laboratory data in the context of comorbid conditions.  The patient requires inpatient status due to high intensity of service, high risk of further deterioration and high frequency of surveillance required.  I certify that at the time of admission, it is my clinical judgment that the patient will require inpatient  hospital care extending more than 2 midnights.                            Dispo: The patient is from: Home              Anticipated d/c is to: Home              Patient currently is not medically stable to d/c.              Difficult to place patient: No  Hannah Beat M.D on 05/27/2023 at 11:29 PM  [facility]   From 7 PM-7 AM, contact night-coverage [email or web address]  CC: Primary care physician; Early, Sung Amabile, NP

## 2023-05-27 NOTE — Assessment & Plan Note (Addendum)
DKA resolved. Glucose was 1055 on admission.  No prior diagnosis of diabetes, but +family history Transitioned off insulin drip overnight --Increase Semglee 12 >> 15 units daily -- Increase sliding scale Novolog to 0-15 moderate  --Continue Q4H CBG's  --Diet resumed --On D5w for hypernatremia, contributing to hyperglycemia  --Follow pending Hbg A1c --Appreciate diabetes coordinator input & pt education --Pt will need insulin at d/c --Will set pt up with CGM prior to d/c

## 2023-05-27 NOTE — ED Triage Notes (Signed)
Patient was BIB [facility]  from home for being unresponsive. EMS reports that RR were in the 50's  Prior to EMS arrival family gave patient a neb, Patient was not talking or opening eyes. 12 Lead was unremarkable. Patent has a history of severe anemia and palpations. No history of DM but BG read HI with EMS. Stroke scale was not done by EMS due to patient unable to follow commands. Patient stated giving one word responses to EMS  as they were in route to ER. LKWT was 5:00 PM yesterday.

## 2023-05-27 NOTE — Assessment & Plan Note (Addendum)
Resolved.   K 7.0 on admission, in setting of AKI and DKA.  Treated and resolved. --Monitor BMP's

## 2023-05-27 NOTE — Assessment & Plan Note (Signed)
-   We will continue PPI therapy 

## 2023-05-27 NOTE — ED Provider Notes (Signed)
[facility] Provider Note    Event Date/Time   First MD Initiated Contact with Patient 05/27/23 2042     (approximate)  History   Chief Complaint: unresponsive  HPI  Frances Rodgers is a 52 y.o. female with a past medical history of anemia, asthma, gastric reflux, presents to the emergency department for unresponsiveness/decreased responsiveness.  According to EMS family member last spoke to the patient last night at which time she sounded fairly normal.  Family members not able to reach the patient today's eventually went to the house where they found the patient to be confused, states her pants were down around her ankles she was not able to respond or answer questions but she was still standing.  EMS states initially when they got to her she was very short of breath with a respiratory rate around 50 breaths/min however this seemed to slow down and transported to the emergency department.  Here patient is breathing around 25 breaths/min.  Patient is awake but she is minimally responsive.  When you say her name she will look at you, will occasionally mumble a response to a question.  Appears to be moving all extremities well no obvious focal deficit seen.  Patient does appear quite confused and fatigued/somnolent appearing.  Blood sugar noted to be greater than 600 on arrival with no known history of diabetes.  Physical Exam   Triage Vital Signs: ED Triage Vitals  Encounter Vitals Group     BP 05/27/23 2048 123/69     Systolic BP Percentile --      Diastolic BP Percentile --      Pulse Rate 05/27/23 2048 (!) 107     Resp --      Temp 05/27/23 2052 99 F (37.2 C)     Temp Source 05/27/23 2052 Axillary     SpO2 05/27/23 2048 100 %     Weight 05/27/23 2105 224 lb 13.9 oz (102 kg)     Height 05/27/23 2105 5\' 5"  (1.651 m)     Head Circumference --      Peak Flow --      Pain Score --      Pain Loc --      Pain Education --      Exclude from Growth Chart --      Most recent vital signs: Vitals:   05/27/23 2048 05/27/23 2052  BP: 123/69   Pulse: (!) 107   Temp:  99 F (37.2 C)  SpO2: 100%     General: Somnolent, will look at you when you speak to her will occasionally attempt to answer question although with little success.  Not following commands but moves all extremities spontaneously. CV:  Good peripheral perfusion.  Regular rate and rhythm around 100 bpm Resp:  Normal effort.  Equal breath sounds bilaterally.  Respiratory rate around 25 breaths/min Abd:  No distention.  Soft, nontender.  No rebound or guarding.  ED Results / Procedures / Treatments   EKG  EKG viewed and interpreted by myself shows sinus tachycardia 109 bpm with a narrow QRS, right axis deviation, largely normal intervals with nonspecific ST changes.  RADIOLOGY  I have reviewed and interpreted CT head images.  No bleed or significant abnormality seen on my evaluation. Radiology is read the CT scan head is negative   MEDICATIONS ORDERED IN ED: Medications  sodium chloride 0.9 % bolus 1,000 mL (has no administration in time range)     IMPRESSION / MDM /  ASSESSMENT AND PLAN / ED COURSE  I reviewed the triage vital signs and the nursing notes.  Patient's presentation is most consistent with acute presentation with potential threat to life or bodily function.  Patient presents emergency department for decreased responsiveness.  Patient was last heard from yesterday evening around 5 PM per EMS report.  Patient's blood glucose greater than 600 no history of diabetes, concern for HHS with coma.  Less likely CVA however we will obtain a CT scan and labs as a precaution.  We will IV hydrate and continue to closely monitor while awaiting lab and VBG results.  Patient's workup has resulted showing significant DKA with a chemistry showing a potassium of 7.0, blood glucose greater than 1000, creatinine 3.01, anion gap of 31.  Patient's VBG pH of 7.18.  CBC shows mild  leukocytosis otherwise reassuring.  Given the patient's elevated potassium sugar and anion gap patient started on insulin infusion.  Patient has completed 2 L of IV fluids and is clinically improving.  For the patient was rather unresponsive upon arrival patient is now asked to use the bathroom, she is asking for something to drink.  Appears to be improving from a mental standpoint.  I have updated the family on the patient's new diabetes diagnosis as well as severity of illness.  Believe the patient will likely require stepdown care.  Spoke to the hospitalist who is agreeable to admitting the patient.  CRITICAL CARE Performed by: Minna Antis   Total critical care time: 45 minutes  Critical care time was exclusive of separately billable procedures and treating other patients.  Critical care was necessary to treat or prevent imminent or life-threatening deterioration.  Critical care was time spent personally by me on the following activities: development of treatment plan with patient and/or surrogate as well as nursing, discussions with consultants, evaluation of patient's response to treatment, examination of patient, obtaining history from patient or surrogate, ordering and performing treatments and interventions, ordering and review of laboratory studies, ordering and review of radiographic studies, pulse oximetry and re-evaluation of patient's condition.   FINAL CLINICAL IMPRESSION(S) / ED DIAGNOSES   Decreased responsiveness Hyperglycemia Diabetic ketoacidosis Hyperkalemia  Note:  This document was prepared using Dragon voice recognition software and may include unintentional dictation errors.   Minna Antis, MD 05/27/23 2257

## 2023-05-27 NOTE — Assessment & Plan Note (Signed)
>>  ASSESSMENT AND PLAN FOR GERD WITHOUT ESOPHAGITIS WRITTEN ON 05/28/2023  3:31 PM BY GRIFFITH, KELLY A, DO  --Continue PPI

## 2023-05-27 NOTE — Assessment & Plan Note (Addendum)
Stable, not exacerbated. --Pulmicort nebs sub'd for home Qvar --Continue Singulair  --PRN albuterol nebs or inhaler

## 2023-05-28 ENCOUNTER — Inpatient Hospital Stay: Payer: 59

## 2023-05-28 DIAGNOSIS — E1110 Type 2 diabetes mellitus with ketoacidosis without coma: Secondary | ICD-10-CM | POA: Diagnosis not present

## 2023-05-28 DIAGNOSIS — M7581 Other shoulder lesions, right shoulder: Secondary | ICD-10-CM | POA: Diagnosis not present

## 2023-05-28 DIAGNOSIS — R338 Other retention of urine: Secondary | ICD-10-CM

## 2023-05-28 DIAGNOSIS — G9341 Metabolic encephalopathy: Secondary | ICD-10-CM | POA: Diagnosis present

## 2023-05-28 DIAGNOSIS — E870 Hyperosmolality and hypernatremia: Secondary | ICD-10-CM

## 2023-05-28 DIAGNOSIS — R030 Elevated blood-pressure reading, without diagnosis of hypertension: Secondary | ICD-10-CM | POA: Diagnosis not present

## 2023-05-28 DIAGNOSIS — R8290 Unspecified abnormal findings in urine: Secondary | ICD-10-CM | POA: Diagnosis present

## 2023-05-28 HISTORY — DX: Metabolic encephalopathy: G93.41

## 2023-05-28 HISTORY — DX: Other retention of urine: R33.8

## 2023-05-28 HISTORY — DX: Elevated blood-pressure reading, without diagnosis of hypertension: R03.0

## 2023-05-28 HISTORY — DX: Unspecified abnormal findings in urine: R82.90

## 2023-05-28 HISTORY — DX: Hyperosmolality and hypernatremia: E87.0

## 2023-05-28 LAB — GLUCOSE, CAPILLARY
Glucose-Capillary: 181 mg/dL — ABNORMAL HIGH (ref 70–99)
Glucose-Capillary: 185 mg/dL — ABNORMAL HIGH (ref 70–99)
Glucose-Capillary: 207 mg/dL — ABNORMAL HIGH (ref 70–99)
Glucose-Capillary: 208 mg/dL — ABNORMAL HIGH (ref 70–99)
Glucose-Capillary: 211 mg/dL — ABNORMAL HIGH (ref 70–99)
Glucose-Capillary: 212 mg/dL — ABNORMAL HIGH (ref 70–99)
Glucose-Capillary: 219 mg/dL — ABNORMAL HIGH (ref 70–99)
Glucose-Capillary: 231 mg/dL — ABNORMAL HIGH (ref 70–99)
Glucose-Capillary: 233 mg/dL — ABNORMAL HIGH (ref 70–99)
Glucose-Capillary: 235 mg/dL — ABNORMAL HIGH (ref 70–99)
Glucose-Capillary: 236 mg/dL — ABNORMAL HIGH (ref 70–99)
Glucose-Capillary: 240 mg/dL — ABNORMAL HIGH (ref 70–99)
Glucose-Capillary: 251 mg/dL — ABNORMAL HIGH (ref 70–99)
Glucose-Capillary: 277 mg/dL — ABNORMAL HIGH (ref 70–99)
Glucose-Capillary: 335 mg/dL — ABNORMAL HIGH (ref 70–99)
Glucose-Capillary: 391 mg/dL — ABNORMAL HIGH (ref 70–99)
Glucose-Capillary: 421 mg/dL — ABNORMAL HIGH (ref 70–99)
Glucose-Capillary: 475 mg/dL — ABNORMAL HIGH (ref 70–99)
Glucose-Capillary: 533 mg/dL (ref 70–99)
Glucose-Capillary: 545 mg/dL (ref 70–99)
Glucose-Capillary: 556 mg/dL (ref 70–99)
Glucose-Capillary: 560 mg/dL (ref 70–99)
Glucose-Capillary: 567 mg/dL (ref 70–99)
Glucose-Capillary: 588 mg/dL (ref 70–99)
Glucose-Capillary: >600 mg/dL (ref 70–99)
Glucose-Capillary: >600 mg/dL (ref 70–99)
Glucose-Capillary: >600 mg/dL (ref 70–99)
Glucose-Capillary: >600 mg/dL (ref 70–99)

## 2023-05-28 LAB — URINALYSIS, COMPLETE (UACMP) WITH MICROSCOPIC
Bilirubin Urine: NEGATIVE
Glucose, UA: >=500 mg/dL — AB
Ketones, ur: 20 mg/dL — AB
Nitrite: NEGATIVE
Protein, ur: 100 mg/dL — AB
RBC / HPF: >50 RBC/hpf (ref 0–5)
Specific Gravity, Urine: 1.025 (ref 1.005–1.030)
pH: 5 (ref 5.0–8.0)

## 2023-05-28 LAB — BASIC METABOLIC PANEL
Anion gap: 30 — ABNORMAL HIGH (ref 5–15)
Anion gap: 22 — ABNORMAL HIGH (ref 5–15)
Anion gap: 14 (ref 5–15)
Anion gap: 12 (ref 5–15)
Anion gap: 9 (ref 5–15)
BUN: 88 mg/dL — ABNORMAL HIGH (ref 6–20)
BUN: 85 mg/dL — ABNORMAL HIGH (ref 6–20)
BUN: 75 mg/dL — ABNORMAL HIGH (ref 6–20)
BUN: 71 mg/dL — ABNORMAL HIGH (ref 6–20)
BUN: 69 mg/dL — ABNORMAL HIGH (ref 6–20)
CO2: 8 mmol/L — ABNORMAL LOW (ref 22–32)
CO2: 13 mmol/L — ABNORMAL LOW (ref 22–32)
CO2: 19 mmol/L — ABNORMAL LOW (ref 22–32)
CO2: 20 mmol/L — ABNORMAL LOW (ref 22–32)
CO2: 21 mmol/L — ABNORMAL LOW (ref 22–32)
Calcium: 11 mg/dL — ABNORMAL HIGH (ref 8.9–10.3)
Calcium: 11.3 mg/dL — ABNORMAL HIGH (ref 8.9–10.3)
Calcium: 11.2 mg/dL — ABNORMAL HIGH (ref 8.9–10.3)
Calcium: 11 mg/dL — ABNORMAL HIGH (ref 8.9–10.3)
Calcium: 11 mg/dL — ABNORMAL HIGH (ref 8.9–10.3)
Chloride: 106 mmol/L (ref 98–111)
Chloride: 114 mmol/L — ABNORMAL HIGH (ref 98–111)
Chloride: 121 mmol/L — ABNORMAL HIGH (ref 98–111)
Chloride: 123 mmol/L — ABNORMAL HIGH (ref 98–111)
Chloride: 122 mmol/L — ABNORMAL HIGH (ref 98–111)
Creatinine, Ser: 2.71 mg/dL — ABNORMAL HIGH (ref 0.44–1.00)
Creatinine, Ser: 2.15 mg/dL — ABNORMAL HIGH (ref 0.44–1.00)
Creatinine, Ser: 1.57 mg/dL — ABNORMAL HIGH (ref 0.44–1.00)
Creatinine, Ser: 1.43 mg/dL — ABNORMAL HIGH (ref 0.44–1.00)
Creatinine, Ser: 1.44 mg/dL — ABNORMAL HIGH (ref 0.44–1.00)
GFR, Estimated: 21 mL/min — ABNORMAL LOW (ref >60)
GFR, Estimated: 27 mL/min — ABNORMAL LOW (ref >60)
GFR, Estimated: 40 mL/min — ABNORMAL LOW (ref >60)
GFR, Estimated: 45 mL/min — ABNORMAL LOW (ref >60)
GFR, Estimated: 44 mL/min — ABNORMAL LOW (ref >60)
Glucose, Bld: 884 mg/dL (ref 70–99)
Glucose, Bld: 680 mg/dL (ref 70–99)
Glucose, Bld: 398 mg/dL — ABNORMAL HIGH (ref 70–99)
Glucose, Bld: 282 mg/dL — ABNORMAL HIGH (ref 70–99)
Glucose, Bld: 234 mg/dL — ABNORMAL HIGH (ref 70–99)
Potassium: 6.4 mmol/L (ref 3.5–5.1)
Potassium: 4.9 mmol/L (ref 3.5–5.1)
Potassium: 4 mmol/L (ref 3.5–5.1)
Potassium: 4 mmol/L (ref 3.5–5.1)
Potassium: 3.8 mmol/L (ref 3.5–5.1)
Sodium: 144 mmol/L (ref 135–145)
Sodium: 148 mmol/L — ABNORMAL HIGH (ref 135–145)
Sodium: 154 mmol/L — ABNORMAL HIGH (ref 135–145)
Sodium: 155 mmol/L — ABNORMAL HIGH (ref 135–145)
Sodium: 152 mmol/L — ABNORMAL HIGH (ref 135–145)

## 2023-05-28 LAB — BETA-HYDROXYBUTYRIC ACID
Beta-Hydroxybutyric Acid: 1.53 mmol/L — ABNORMAL HIGH (ref 0.05–0.27)
Beta-Hydroxybutyric Acid: 5.86 mmol/L — ABNORMAL HIGH (ref 0.05–0.27)
Beta-Hydroxybutyric Acid: >8 mmol/L — ABNORMAL HIGH (ref 0.05–0.27)
Beta-Hydroxybutyric Acid: >8 mmol/L — ABNORMAL HIGH (ref 0.05–0.27)

## 2023-05-28 LAB — MRSA NEXT GEN BY PCR, NASAL: MRSA by PCR Next Gen: NOT DETECTED

## 2023-05-28 LAB — TSH: TSH: 1.43 u[IU]/mL (ref 0.450–4.500)

## 2023-05-28 LAB — CBC
HCT: 43.2 % (ref 36.0–46.0)
Hemoglobin: 13 g/dL (ref 12.0–15.0)
MCH: 23.6 pg — ABNORMAL LOW (ref 26.0–34.0)
MCHC: 30.1 g/dL (ref 30.0–36.0)
MCV: 78.5 fL — ABNORMAL LOW (ref 80.0–100.0)
Platelets: 387 10*3/uL (ref 150–400)
RBC: 5.5 MIL/uL — ABNORMAL HIGH (ref 3.87–5.11)
RDW: 15.7 % — ABNORMAL HIGH (ref 11.5–15.5)
WBC: 16.9 10*3/uL — ABNORMAL HIGH (ref 4.0–10.5)
nRBC: 0 % (ref 0.0–0.2)

## 2023-05-28 LAB — T4, FREE: Free T4: 1.28 ng/dL (ref 0.82–1.77)

## 2023-05-28 LAB — ANTI-CCP AB, IGG + IGA (RDL): Anti-CCP Ab, IgG + IgA (RDL): <20 Units (ref <20)

## 2023-05-28 LAB — RHEUMATOID FACTOR: Rheumatoid fact SerPl-aCnc: 12.1 IU/mL (ref <14.0)

## 2023-05-28 LAB — HIV ANTIBODY (ROUTINE TESTING W REFLEX): HIV Screen 4th Generation wRfx: NONREACTIVE

## 2023-05-28 LAB — THYROID PEROXIDASE ANTIBODY: Thyroperoxidase Ab SerPl-aCnc: 17 IU/mL (ref 0–34)

## 2023-05-28 LAB — ANA W/REFLEX IF POSITIVE: Anti Nuclear Antibody (ANA): NEGATIVE

## 2023-05-28 MED ORDER — ORAL CARE MOUTH RINSE: 15.0000 mL | OROMUCOSAL | Status: DC | PRN

## 2023-05-28 MED ORDER — INSULIN GLARGINE-YFGN 100 UNIT/ML ~~LOC~~ SOLN
12.0000 [IU] | Freq: Every day | SUBCUTANEOUS | Status: DC
  Administered 2023-05-29 (×2): 12 [IU] via SUBCUTANEOUS
  Filled 2023-05-28 (×2): qty 0.12

## 2023-05-28 MED ORDER — INSULIN ASPART 100 UNIT/ML IJ SOLN
0.0000 [IU] | INTRAMUSCULAR | Status: DC
  Administered 2023-05-29 (×2): 3 [IU] via SUBCUTANEOUS
  Filled 2023-05-28 (×2): qty 1

## 2023-05-28 MED ORDER — CHLORHEXIDINE GLUCONATE CLOTH 2 % EX PADS
6.0000 | MEDICATED_PAD | Freq: Every day | CUTANEOUS | Status: DC
  Administered 2023-05-28 – 2023-05-31 (×4): 6 via TOPICAL

## 2023-05-28 MED ORDER — HYDRALAZINE HCL 20 MG/ML IJ SOLN
5.0000 mg | Freq: Four times a day (QID) | INTRAMUSCULAR | Status: DC | PRN
  Administered 2023-05-28: 5 mg via INTRAVENOUS
  Filled 2023-05-28: qty 1

## 2023-05-28 MED ORDER — SODIUM CHLORIDE 0.9 % IV SOLN: INTRAVENOUS | Status: DC

## 2023-05-28 MED ORDER — HYDRALAZINE HCL 25 MG PO TABS: 25.0000 mg | ORAL_TABLET | Freq: Four times a day (QID) | ORAL | Status: DC | PRN

## 2023-05-28 MED ORDER — AMLODIPINE BESYLATE 5 MG PO TABS
5.0000 mg | ORAL_TABLET | Freq: Every day | ORAL | Status: DC
  Administered 2023-05-28: 5 mg via ORAL
  Filled 2023-05-28: qty 1

## 2023-05-28 MED ORDER — AMLODIPINE BESYLATE 10 MG PO TABS
10.0000 mg | ORAL_TABLET | Freq: Every day | ORAL | Status: DC
  Administered 2023-05-29 – 2023-05-31 (×3): 10 mg via ORAL
  Filled 2023-05-28 (×4): qty 1

## 2023-05-28 MED ORDER — DEXTROSE-SODIUM CHLORIDE 5-0.45 % IV SOLN: INTRAVENOUS | Status: DC

## 2023-05-28 NOTE — Progress Notes (Addendum)
Progress Note   Patient: Frances Rodgers UJW:[id number] DOB: [birth year] DOA: 05/27/2023     1 DOS: the patient was seen and examined on 05/28/2023   Brief hospital course: HPI on admission 05/27/23 PM by Dr. Arville Care: "Frances Rodgers is a 52 y.o. female with medical history significant for asthma, and GERD, who presented to the emergency room with acute onset of altered mental status with unresponsiveness.  The patient's family last talked to her around 5 PM last night.  When she did not respond they went to her home.  They found her with her pants around the ankle and she was fairly confused upon arrival of EMS with tachypnea.  When she came to the ER she was awake and alert but not responding much.  She was fairly somnolent and fatigued.  Blood glucose was noted to be more than 600 on arrival and she has no previous history of diabetes mellitus.  The patient was still somnolent during my interview and therefore no further history could be obtained. "  Vitals in ER - HR 107, RR 28, temp 99 Labs were consistent with severe DKA. GLUCOSE 1055, POTASSIUM 7.0, bicarb 8, anion gap 31.  Creatinine 3.01 (normal baseline renal function).  CT head was non-acute.  Patient was admitted to stepdown unit and started on DKA protocol with IV insulin.  7/14: glucose improving.  Pt awake but non-verbal, does follow commands appropriately.  Gap closed x 2, transitioning to subcutaneous insulin.  Family visiting - report pt recently treated with steroids with chronic shoulder pain.  Confirm pt has no prior dx of diabetes but significant family history in multiple.    Assessment and Plan: * DKA (diabetic ketoacidosis) (HCC) DKA resolving. Glucose was 1055 on admission.  No prior diagnosis of diabetes, but +family history. --Transition IV >> subcutaneous insulin --Start with Semglee 12 units daily + sliding scale Novolo 0-9 units Q4H  for now --Continue Q4H CBG's  --Resume diet --Fluids changed to D5-1/2NS due  to worsening hypernatremia --Continue serial BMP's for today to ensure DKA remains resolved --Follow pending Hbg A1c --Will get diabetes educator's input & assistance tomorrow. Expect pt will need insulin at d/c.  Hypernatremia Na 140 on admission, give aggressive NS fluids per DKA protocol.   Sodium rising: 148 >> 154 >> 155 --Fluids changed to D5-1/2 NS  (still need intravascular volume for AKI and just resolving DKA) --Encourage pt to drink water --Serial BMP's to monitor closely  Acute metabolic encephalopathy Due to DKA and other metabolic derangements. --Mgmt of underlying issues as outlined --Delirium precautions --Neuro checks  Hyperkalemia K 7.0 on admission, in setting of AKI and DKA.  Treated and resolved. Last K 4.0 this AM --Monitor BMP's  Abnormal urinalysis Pt non-verbal, unclear if UTI symptoms. Family deny pt having any recent complaints of symptoms. UA showed cloudy appearance, >500 glucose, 100 protein, small leukocytes, negative nitrite, rare bacteria, 21-50 wbc's, 6-10 epi cells, notably present yeast, hyaline casts and mucus. --Add on Urine culture --Query UTI symptoms when pt is more interactive --Hold off antibiotics and monitor for now  Acute urinary retention Continue in/out cath's if > 350 on bladder scan or pt feels need void & not able. Place foley if ongoing retention.  AKI (acute kidney injury) (HCC) Cr on admission 3.01, baseline normal. Renal function improving with hydration. Cr today 1.57 --Monitor BMP --Avoid nephrotoxins, hypotension --Renally dose meds  Shoulder pain, left Chronic. Left shoulder adhesive capsulitis Chart reviewed - seen by Ortho 6/7 and  6/21 and diagnosed with adhesive capsulitis.  Given injection of steroid and lidocaine at both visits. Daughter reports she was also treated with a course of prednisone. Steroid exposure likely precipitated DKA in pt without prior diagnosis of diabetes. --Supportive care, pain  control --OT evaluation when clinically improved --Follow up with Ortho as scheduled  GERD without esophagitis --Continue PPI   Asthma Stable, not exacerbated. --Continue Qvar and Singulair  --PRN albuterol  Elevated blood pressure reading I don't see formal dx of hypertension, but BP's have been elevated. Pt is getting aggressive IV hydrations, likely contributes. --Started amlodipine 5 mg PO daily --IV hydralazine PRN if SBP>160 --PCP follow up        Subjective: Pt seen in stepdown this AM.  She is non-verbal for me, RN reports same.  Pt does open eyes, follows commands, but does not speak.  Family arrived later this AM, pt not talking for them either.  Family deny pt being sick recently.  Has been on treatment for shoulder pain, including prednisone, per daughter.  They report multiple family members with diabetes.   Physical Exam: Vitals:   05/28/23 1400 05/28/23 1415 05/28/23 1430 05/28/23 1500  BP: (!) 162/93   (!) 157/89  Pulse: (!) 109 (!) 105 (!) 104 (!) 109  Resp: (!) 23 (!) 28 (!) 29 (!) 22  Temp:      TempSrc:      SpO2: 96% 96% 95% 98%  Weight:      Height:       General exam: awake, alert, no acute distress, non-verbal HEENT: moist mucus membranes, hearing grossly normal  Respiratory system: CTA on right, inaudible breath sounds on left, no wheezes, normal respiratory effort. Cardiovascular system: normal S1/S2, RRR, no pedal edema.   Gastrointestinal system: soft, NT, ND, no HSM felt, +bowel sounds. Central nervous system: exam limited by pt non-verbal, she does follow basic commands Extremities: mittens on hands, no edema, normal tone Skin: dry, intact, normal temperature Psychiatry: unable to assess as pt non-verbal, flat affect    Data Reviewed:  Most recent BMP's ---  Sodium 154 >> 155 (140 on admission), Cl 120 >> 123, bicarb 19 >> 20, glucose 298 >> 282, BUN 75 >> 71, Cr 1.57 >> 1.43, Ca 11.2, 11.0, anion gap 14 >> 12  CBG's 235 >> 251 >>  236 >> 240 >> 212 >> 219  Family Communication: sister and daughter updated at bedside  Disposition: Status is: Inpatient Remains inpatient appropriate because: severity of illness as above, remains on IV therapies   Planned Discharge Destination: Home    Time spent: 58 minutes including time at bedside and in coordination of care  Author: Pennie Banter, DO 05/28/2023 3:43 PM  For on call review [email or web address].

## 2023-05-28 NOTE — Hospital Course (Addendum)
HPI on admission 05/27/23 PM by Dr. Arville Care: "CHRISTELLA APP is a 52 y.o. female with medical history significant for asthma, and GERD, who presented to the emergency room with acute onset of altered mental status with unresponsiveness.  The patient's family last talked to her around 5 PM last night.  When she did not respond they went to her home.  They found her with her pants around the ankle and she was fairly confused upon arrival of EMS with tachypnea.  When she came to the ER she was awake and alert but not responding much.  She was fairly somnolent and fatigued.  Blood glucose was noted to be more than 600 on arrival and she has no previous history of diabetes mellitus.  The patient was still somnolent during my interview and therefore no further history could be obtained. "  Vitals in ER - HR 107, RR 28, temp 99 Labs were consistent with severe DKA. GLUCOSE 1055, POTASSIUM 7.0, bicarb 8, anion gap 31.  Creatinine 3.01 (normal baseline renal function).  CT head was non-acute.  Patient was admitted to stepdown unit and started on DKA protocol with IV insulin.  7/14: glucose improving.  Pt awake but non-verbal, does follow commands appropriately.  Gap closed x 2, transitioning to subcutaneous insulin.  Family visiting - report pt recently treated with steroids with chronic shoulder pain.  Confirm pt has no prior dx of diabetes but significant family history in multiple.  7/15: pt verbal today, able to answer questions, but mentation still not at baseline.  Started antibiotics due to fever / sepsis.  Follow cultures.  7/16: lactate elevated, given fluids per sepsis.  Mentation further improved.  Stable for transfer to med/tele floor.

## 2023-05-28 NOTE — Progress Notes (Signed)
PHARMACIST - PHYSICIAN COMMUNICATION  CONCERNING:  Enoxaparin (Lovenox) for DVT Prophylaxis    RECOMMENDATION: Patient was prescribed enoxaprin 40mg  q24 hours for VTE prophylaxis.   Filed Weights   05/27/23 2105  Weight: 102 kg (224 lb 13.9 oz)    Body mass index is 37.42 kg/m.  Estimated Creatinine Clearance: 37.1 mL/min (A) (by C-G formula based on SCr of 2.15 mg/dL (H)).   Based on [facility] policy patient is candidate for enoxaparin 0.5mg /kg TBW SQ every 24 hours based on BMI being >30.  DESCRIPTION: Pharmacy has adjusted enoxaparin dose per [facility] policy.  Patient is now receiving enoxaparin 0.5 mg/kg every 24 hours   Otelia Sergeant, PharmD, [facility] 05/28/2023 5:39 AM

## 2023-05-28 NOTE — Assessment & Plan Note (Addendum)
POA.  Improving slowly.   Due to DKA and other metabolic derangements. 7/14 - pt non-verbal but follows commands 7/15 - mentation improving, pt verbal today but delayed answers to yes/no questions 7/16 - more talkative, improving --Mgmt of underlying issues as outlined --Delirium precautions --Neuro checks

## 2023-05-28 NOTE — Assessment & Plan Note (Addendum)
Now Resolved.   Na 140 on admission, give aggressive NS fluids per DKA protocol.   Sodium peaked at 156. Treated with dextrose fluids. --Encourage pt to drink water --Serial BMP's to monitor closely

## 2023-05-28 NOTE — Assessment & Plan Note (Addendum)
Chronic. Left shoulder adhesive capsulitis Chart reviewed - seen by Ortho 6/7 and 6/21 and diagnosed with adhesive capsulitis.  Given injection of steroid and lidocaine at both visits. Daughter reports she was also treated with a course of prednisone. Steroid exposure likely precipitated DKA in pt without prior diagnosis of diabetes. --Supportive care, pain control --OT evaluation when clinically improved --Follow up with Ortho as scheduled

## 2023-05-28 NOTE — Assessment & Plan Note (Addendum)
Pt non-verbal, unclear if UTI symptoms. Family deny pt having any recent complaints of symptoms. UA showed cloudy appearance, >500 glucose, 100 protein, small leukocytes, negative nitrite, rare bacteria, 21-50 wbc's, 6-10 epi cells, notably present yeast, hyaline casts and mucus. --Urine culture pending --Starting empiric Rocephin today due to possible sepsis --Query UTI symptoms when pt is more interactive --Hold off antibiotics and monitor for now --Has Foley for acute urinary retention

## 2023-05-28 NOTE — Assessment & Plan Note (Addendum)
Continue in/out cath's if > 350 on bladder scan or pt feels need void & not able. --Foley placed --Voiding trial once pt clinically improved

## 2023-05-28 NOTE — Plan of Care (Signed)
  Problem: Education: Goal: Ability to describe self-care measures that may prevent or decrease complications (Diabetes Survival Skills Education) will improve Outcome: Progressing Goal: Individualized Educational Video(s) Outcome: Progressing   Problem: Coping: Goal: Ability to adjust to condition or change in health will improve Outcome: Progressing   Problem: Fluid Volume: Goal: Ability to maintain a balanced intake and output will improve Outcome: Progressing   Problem: Health Behavior/Discharge Planning: Goal: Ability to identify and utilize available resources and services will improve Outcome: Progressing Goal: Ability to manage health-related needs will improve Outcome: Progressing   Problem: Health Behavior/Discharge Planning: Goal: Ability to identify and utilize available resources and services will improve Outcome: Progressing Goal: Ability to manage health-related needs will improve Outcome: Progressing   Problem: Metabolic: Goal: Ability to maintain appropriate glucose levels will improve Outcome: Progressing   Problem: Nutritional: Goal: Maintenance of adequate nutrition will improve Outcome: Progressing Goal: Progress toward achieving an optimal weight will improve Outcome: Progressing   Problem: Tissue Perfusion: Goal: Adequacy of tissue perfusion will improve Outcome: Progressing   Problem: Education: Goal: Ability to describe self-care measures that may prevent or decrease complications (Diabetes Survival Skills Education) will improve Outcome: Progressing Goal: Individualized Educational Video(s) Outcome: Progressing   Problem: Health Behavior/Discharge Planning: Goal: Ability to identify and utilize available resources and services will improve Outcome: Progressing Goal: Ability to manage health-related needs will improve Outcome: Progressing   Problem: Metabolic: Goal: Ability to maintain appropriate glucose levels will improve Outcome:  Progressing   Problem: Nutritional: Goal: Maintenance of adequate nutrition will improve Outcome: Progressing Goal: Maintenance of adequate weight for body size and type will improve Outcome: Progressing   Problem: Respiratory: Goal: Will regain and/or maintain adequate ventilation Outcome: Progressing   Problem: Urinary Elimination: Goal: Ability to achieve and maintain adequate renal perfusion and functioning will improve Outcome: Progressing   Problem: Education: Goal: Knowledge of General Education information will improve Description: Including pain rating scale, medication(s)/side effects and non-pharmacologic comfort measures Outcome: Progressing   Problem: Health Behavior/Discharge Planning: Goal: Ability to manage health-related needs will improve Outcome: Progressing   Problem: Clinical Measurements: Goal: Ability to maintain clinical measurements within normal limits will improve Outcome: Progressing Goal: Will remain free from infection Outcome: Progressing Goal: Diagnostic test results will improve Outcome: Progressing Goal: Respiratory complications will improve Outcome: Progressing Goal: Cardiovascular complication will be avoided Outcome: Progressing

## 2023-05-28 NOTE — Assessment & Plan Note (Addendum)
I don't see formal dx of hypertension, but BP's have been elevated. Pt is getting aggressive IV hydrations, likely contributes. --Started on amlodipine 10 mg PO daily --PO or IV hydralazine PRN if SBP>160 --PCP follow up

## 2023-05-29 ENCOUNTER — Encounter: Payer: Self-pay | Admitting: Internal Medicine

## 2023-05-29 ENCOUNTER — Other Ambulatory Visit (HOSPITAL_COMMUNITY): Payer: Self-pay

## 2023-05-29 DIAGNOSIS — R8290 Unspecified abnormal findings in urine: Secondary | ICD-10-CM | POA: Diagnosis not present

## 2023-05-29 DIAGNOSIS — E870 Hyperosmolality and hypernatremia: Secondary | ICD-10-CM | POA: Diagnosis not present

## 2023-05-29 DIAGNOSIS — A419 Sepsis, unspecified organism: Secondary | ICD-10-CM

## 2023-05-29 DIAGNOSIS — G9341 Metabolic encephalopathy: Secondary | ICD-10-CM | POA: Diagnosis not present

## 2023-05-29 DIAGNOSIS — E1110 Type 2 diabetes mellitus with ketoacidosis without coma: Secondary | ICD-10-CM | POA: Diagnosis not present

## 2023-05-29 HISTORY — DX: Sepsis, unspecified organism: A41.9

## 2023-05-29 LAB — BASIC METABOLIC PANEL
Anion gap: 9 (ref 5–15)
Anion gap: 7 (ref 5–15)
BUN: 61 mg/dL — ABNORMAL HIGH (ref 6–20)
BUN: 52 mg/dL — ABNORMAL HIGH (ref 6–20)
CO2: 19 mmol/L — ABNORMAL LOW (ref 22–32)
CO2: 21 mmol/L — ABNORMAL LOW (ref 22–32)
Calcium: 11 mg/dL — ABNORMAL HIGH (ref 8.9–10.3)
Calcium: 10 mg/dL (ref 8.9–10.3)
Chloride: 128 mmol/L — ABNORMAL HIGH (ref 98–111)
Chloride: 117 mmol/L — ABNORMAL HIGH (ref 98–111)
Creatinine, Ser: 1.49 mg/dL — ABNORMAL HIGH (ref 0.44–1.00)
Creatinine, Ser: 1.36 mg/dL — ABNORMAL HIGH (ref 0.44–1.00)
GFR, Estimated: 43 mL/min — ABNORMAL LOW (ref >60)
GFR, Estimated: 47 mL/min — ABNORMAL LOW (ref >60)
Glucose, Bld: 229 mg/dL — ABNORMAL HIGH (ref 70–99)
Glucose, Bld: 461 mg/dL — ABNORMAL HIGH (ref 70–99)
Potassium: 4.1 mmol/L (ref 3.5–5.1)
Potassium: 3.6 mmol/L (ref 3.5–5.1)
Sodium: 156 mmol/L — ABNORMAL HIGH (ref 135–145)
Sodium: 145 mmol/L (ref 135–145)

## 2023-05-29 LAB — GLUCOSE, CAPILLARY
Glucose-Capillary: 150 mg/dL — ABNORMAL HIGH (ref 70–99)
Glucose-Capillary: 162 mg/dL — ABNORMAL HIGH (ref 70–99)
Glucose-Capillary: 190 mg/dL — ABNORMAL HIGH (ref 70–99)
Glucose-Capillary: 203 mg/dL — ABNORMAL HIGH (ref 70–99)
Glucose-Capillary: 248 mg/dL — ABNORMAL HIGH (ref 70–99)
Glucose-Capillary: 270 mg/dL — ABNORMAL HIGH (ref 70–99)
Glucose-Capillary: 346 mg/dL — ABNORMAL HIGH (ref 70–99)
Glucose-Capillary: 420 mg/dL — ABNORMAL HIGH (ref 70–99)
Glucose-Capillary: >600 mg/dL (ref 70–99)

## 2023-05-29 LAB — MAGNESIUM: Magnesium: 2.8 mg/dL — ABNORMAL HIGH (ref 1.7–2.4)

## 2023-05-29 LAB — CBC
HCT: 38.2 % (ref 36.0–46.0)
Hemoglobin: 11.9 g/dL — ABNORMAL LOW (ref 12.0–15.0)
MCH: 23.9 pg — ABNORMAL LOW (ref 26.0–34.0)
MCHC: 31.2 g/dL (ref 30.0–36.0)
MCV: 76.7 fL — ABNORMAL LOW (ref 80.0–100.0)
Platelets: 320 10*3/uL (ref 150–400)
RBC: 4.98 MIL/uL (ref 3.87–5.11)
RDW: 15.8 % — ABNORMAL HIGH (ref 11.5–15.5)
WBC: 13.7 10*3/uL — ABNORMAL HIGH (ref 4.0–10.5)
nRBC: 0.4 % — ABNORMAL HIGH (ref 0.0–0.2)

## 2023-05-29 LAB — URINE CULTURE

## 2023-05-29 LAB — D-DIMER, QUANTITATIVE: D-Dimer, Quant: 4.74 ug/mL-FEU — ABNORMAL HIGH (ref 0.00–0.50)

## 2023-05-29 LAB — HEMOGLOBIN A1C
Hgb A1c MFr Bld: 11.7 % — ABNORMAL HIGH (ref 4.8–5.6)
Mean Plasma Glucose: 289 mg/dL

## 2023-05-29 LAB — LACTIC ACID, PLASMA
Lactic Acid, Venous: 1.9 mmol/L (ref 0.5–1.9)
Lactic Acid, Venous: 3.5 mmol/L (ref 0.5–1.9)

## 2023-05-29 LAB — PROCALCITONIN: Procalcitonin: 0.21 ng/mL

## 2023-05-29 MED ORDER — DEXTROSE-SODIUM CHLORIDE 5-0.45 % IV SOLN: INTRAVENOUS | Status: DC

## 2023-05-29 MED ORDER — LIVING WELL WITH DIABETES BOOK
Freq: Once | Status: AC
  Filled 2023-05-29: qty 1

## 2023-05-29 MED ORDER — INSULIN ASPART 100 UNIT/ML IJ SOLN
0.0000 [IU] | Freq: Every day | INTRAMUSCULAR | Status: DC
  Administered 2023-05-29: 3 [IU] via SUBCUTANEOUS
  Administered 2023-05-30: 2 [IU] via SUBCUTANEOUS
  Filled 2023-05-29 (×2): qty 1

## 2023-05-29 MED ORDER — DEXTROSE 5 % IV SOLN: INTRAVENOUS | Status: DC

## 2023-05-29 MED ORDER — LACTATED RINGERS IV SOLN: INTRAVENOUS | Status: DC

## 2023-05-29 MED ORDER — INSULIN GLARGINE-YFGN 100 UNIT/ML ~~LOC~~ SOLN
15.0000 [IU] | Freq: Every day | SUBCUTANEOUS | Status: DC
  Filled 2023-05-29: qty 0.15

## 2023-05-29 MED ORDER — SODIUM CHLORIDE 0.9 % IV SOLN
1.0000 g | INTRAVENOUS | Status: DC
  Administered 2023-05-29 – 2023-05-31 (×3): 1 g via INTRAVENOUS
  Filled 2023-05-29 (×3): qty 10

## 2023-05-29 MED ORDER — INSULIN ASPART 100 UNIT/ML IJ SOLN
0.0000 [IU] | Freq: Three times a day (TID) | INTRAMUSCULAR | Status: DC
  Administered 2023-05-29 – 2023-05-30 (×2): 11 [IU] via SUBCUTANEOUS
  Administered 2023-05-30: 15 [IU] via SUBCUTANEOUS
  Administered 2023-05-31 (×3): 8 [IU] via SUBCUTANEOUS
  Administered 2023-06-01: 3 [IU] via SUBCUTANEOUS
  Filled 2023-05-29 (×7): qty 1

## 2023-05-29 MED ORDER — INSULIN ASPART 100 UNIT/ML IJ SOLN
20.0000 [IU] | Freq: Once | INTRAMUSCULAR | Status: AC
  Administered 2023-05-29: 20 [IU] via SUBCUTANEOUS
  Filled 2023-05-29: qty 1

## 2023-05-29 NOTE — Progress Notes (Addendum)
Progress Note   Patient: Frances Rodgers [id number]:[id number] DOB: March 05, 1971 DOA: 05/27/2023     2 DOS: the patient was seen and examined on 05/29/2023   Brief hospital course: HPI on admission 05/27/23 PM by Dr. Arville Care: "Frances Rodgers is a 52 y.o. female with medical history significant for asthma, and GERD, who presented to the emergency room with acute onset of altered mental status with unresponsiveness.  The patient's family last talked to her around 5 PM last night.  When she did not respond they went to her home.  They found her with her pants around the ankle and she was fairly confused upon arrival of EMS with tachypnea.  When she came to the ER she was awake and alert but not responding much.  She was fairly somnolent and fatigued.  Blood glucose was noted to be more than 600 on arrival and she has no previous history of diabetes mellitus.  The patient was still somnolent during my interview and therefore no further history could be obtained. "  Vitals in ER - HR 107, RR 28, temp 99 Labs were consistent with severe DKA. GLUCOSE 1055, POTASSIUM 7.0, bicarb 8, anion gap 31.  Creatinine 3.01 (normal baseline renal function).  CT head was non-acute.  Patient was admitted to stepdown unit and started on DKA protocol with IV insulin.  7/14: glucose improving.  Pt awake but non-verbal, does follow commands appropriately.  Gap closed x 2, transitioning to subcutaneous insulin.  Family visiting - report pt recently treated with steroids with chronic shoulder pain.  Confirm pt has no prior dx of diabetes but significant family history in multiple.  7/15: pt verbal today, able to answer questions, but mentation still not at baseline.  Started antibiotics due to fever / sepsis.  Follow cultures.    Assessment and Plan: * DKA (diabetic ketoacidosis) (HCC) DKA resolved. Glucose was 1055 on admission.  No prior diagnosis of diabetes, but +family history Transitioned off insulin drip  overnight --Increase Semglee 12 >> 15 units daily -- Increase sliding scale Novolog to 0-15 moderate  --Continue Q4H CBG's  --Diet resumed --On D5w for hypernatremia, contributing to hyperglycemia  --Follow pending Hbg A1c --Appreciate diabetes coordinator input & pt education --Pt will need insulin at d/c --Will set pt up with CGM prior to d/c  Sepsis (HCC) Clinically undetermined if sepsis was present on admission due to DKA.  7/15 - DKA now resolved, but pt having tachycardia, tachypnea, fever 100.8.  With abnormal UA on admission, likely sepsis due to UTI --Collect blood cultures x 2, lactic acid, procalcitonin --Check D-dimer & if positive, need to rule out PE --Start Rocephin for suspected UTI --Follow cultures --Lactic acid normal --Monitor fever curve, HR, CBC's  Hypernatremia Na 140 on admission, give aggressive NS fluids per DKA protocol.   Sodium rising: 148 >> 154 >> 155 --Fluids changed to D5-1/2 NS  (still need intravascular volume for AKI and just resolving DKA) --Encourage pt to drink water --Serial BMP's to monitor closely  Acute metabolic encephalopathy Due to DKA and other metabolic derangements. 7/14 - pt non-verbal but follows commands 7/15 - mentation improving, pt verbal today --Mgmt of underlying issues as outlined --Delirium precautions --Neuro checks  Hyperkalemia K 7.0 on admission, in setting of AKI and DKA.  Treated and resolved. Last K 4.0 this AM --Monitor BMP's  Abnormal urinalysis Pt non-verbal, unclear if UTI symptoms. Family deny pt having any recent complaints of symptoms. UA showed cloudy appearance, >500 glucose, 100 protein,  small leukocytes, negative nitrite, rare bacteria, 21-50 wbc's, 6-10 epi cells, notably present yeast, hyaline casts and mucus. --Urine culture pending --Starting empiric Rocephin today due to possible sepsis --Query UTI symptoms when pt is more interactive --Hold off antibiotics and monitor for now --Has  Foley for acute urinary retention  Acute urinary retention Continue in/out cath's if > 350 on bladder scan or pt feels need void & not able. --Foley placed --Voiding trial once pt clinically improved  AKI (acute kidney injury) (HCC) Cr on admission 3.01, baseline normal. Renal function improving with hydration. Cr today 1.49 --Monitor BMP --Avoid nephrotoxins, hypotension --Renally dose meds  Shoulder pain, left Chronic. Left shoulder adhesive capsulitis Chart reviewed - seen by Ortho 6/7 and 6/21 and diagnosed with adhesive capsulitis.  Given injection of steroid and lidocaine at both visits. Daughter reports she was also treated with a course of prednisone. Steroid exposure likely precipitated DKA in pt without prior diagnosis of diabetes. --Supportive care, pain control --OT evaluation when clinically improved --Follow up with Ortho as scheduled  GERD without esophagitis --Continue PPI   Asthma Stable, not exacerbated. --Continue Qvar and Singulair  --PRN albuterol  Elevated blood pressure reading I don't see formal dx of hypertension, but BP's have been elevated. Pt is getting aggressive IV hydrations, likely contributes. --Increase amlodipine 5 >> 10 mg PO daily --PO or IV hydralazine PRN if SBP>160 --PCP follow up        Subjective: Pt seen in stepdown this AM.  She does answer questions today, but mental status not at baseline. Responses are delayed.  She denies abdominal pain, nausea/vomiting, fever/chills, dysuria prior to admission but does endorse urinary frequency.  No cough congestion or sore throat.  No family present at time of my encounter.   Physical Exam: Vitals:   05/29/23 0900 05/29/23 1000 05/29/23 1100 05/29/23 1200  BP: (!) 140/93 130/89 139/84 116/80  Pulse: (!) 118 (!) 119 (!) 118 (!) 119  Resp: (!) 30 (!) 30 (!) 26 (!) 28  Temp:      TempSrc:      SpO2: 97% 97% 97% 96%  Weight:      Height:       General exam: awake, alert, no acute  distress, does answer questions today but slowly HEENT: moist mucus membranes, hearing grossly normal  Respiratory system: CTAB, no wheezes or rhonchi, normal respiratory effort. Cardiovascular system: normal S1/S2, RRR, no pedal edema.   Gastrointestinal system: soft, NT, ND Central nervous system: normal speech, grossly non-focal, follows commands Extremities: mittens on hands, no edema, normal tone Skin: dry, intact, normal temperature Psychiatry: normal mood, flat affect, not enough interaction to assess judgment and insight    Data Reviewed:  Most recent BMP's ---  Sodium 156, Cl 128, bicarb 19, glucose 229, BUN 61, Cr 1.49, Ca 11.0 Lactic acid 1.9 Procal 0.21  Recent CBG's 162 >> 150 >> 203 >> 248 >> 346  Family Communication: sister and daughter updated at bedside 7/14  Disposition: Status is: Inpatient Remains inpatient appropriate because: severity of illness as above, remains on IV therapies   Planned Discharge Destination: Home    Time spent: 46 minutes  Author: Pennie Banter, DO 05/29/2023 1:03 PM  For on call review [email or web address].

## 2023-05-29 NOTE — Discharge Instructions (Addendum)

## 2023-05-29 NOTE — Assessment & Plan Note (Addendum)
Clinically undetermined if sepsis was present on admission due to DKA.  7/15 - DKA now resolved, but pt having tachycardia, tachypnea, fever 100.8.  With abnormal UA on admission, likely sepsis due to UTI --Collect blood cultures x 2, lactic acid, procalcitonin --Lactate 3.5 >> 3.0 this AM --1 L bolus LR & continue maintenance LR per orders --Trend lactate --Continue empiric Rocephin for suspected UTI --Follow cultures --Monitor fever curve, HR, CBC's

## 2023-05-29 NOTE — Progress Notes (Signed)
Endo tool suggest to transition, orders previously placed. Long acting insulin given per orders.

## 2023-05-29 NOTE — Plan of Care (Signed)
  RD consulted for nutrition education regarding diabetes.   Lab Results  Component Value Date   HGBA1C 11.7 (H) 05/28/2023   PTA DM medications are none.   Labs reviewed: CBGS: 346 (inpatient orders for glycemic control are on IV insulin drip).    Spoke with pt at bedside. Pt still drowsy, but able to answer some close ended questions. She is unable to fully participate in education session. History obtained from pt daughter and mom at bedside. Pt lives alone, but has family nearby and has a very strong family history of DM (brother, sister, and mom all diagnosed with DM).   Per daughter, pt was making lifestyle changes PTA. She usually eats 3 meals per day. Meals include foods like cereal, fruit, rice, [place], and vegetables. Pt was losing weight intentionally PTA, however, daughter shares that efforts are often inconsistent. She was eating a lot of rice, meat, and vegetables as she found that this diet plan has made her successful with weight loss in the past. Pt consumes mostly water, but also drinks fruit juice, coffee, and tea. She has been using splenda to sweeten her beverages.   Pt has access to a treadmill at home and uses it about once a week.   Case and findings discussed with both RN and diabetes coordinator.   RD also referred to [facility]'s Nutrition and Diabetes Education Services for further support, education, and reinforcement.   RD provided "Carbohydrate Counting for People with Diabetes" handout from the Academy of Nutrition and Dietetics. Discussed different food groups and their effects on blood sugar, emphasizing carbohydrate-containing foods. Provided list of carbohydrates and recommended serving sizes of common foods.  Discussed importance of controlled and consistent carbohydrate intake throughout the day. Provided examples of ways to balance meals/snacks and encouraged intake of high-fiber, whole grain complex carbohydrates. Teach back method used.  Expect fair to  good compliance.  Body mass index is 37.42 kg/m. Pt meets criteria for obesity, class II based on current BMI. Obesity is a complex, chronic medical condition that is optimally managed by a multidisciplinary care team. Weight loss is not an ideal goal for an acute inpatient hospitalization. However, if further work-up for obesity is warranted, consider outpatient referral to [facility] Nutrition and Diabetes Education Services.    Current diet order is carb modified, patient is consuming approximately 100% of meals at this time. Labs and medications reviewed. No further nutrition interventions warranted at this time. RD contact information provided. If additional nutrition issues arise, please re-consult RD.  Levada Schilling, RD, LDN, CDCES Registered Dietitian II Certified Diabetes Care and Education Specialist Please refer to [facility] for RD and/or RD on-call/weekend/after hours pager

## 2023-05-29 NOTE — TOC CM/SW Note (Signed)
Transition of Care [facility]) - Inpatient Brief Assessment   Patient Details  Name: Frances Rodgers MRN: [id number] Date of Birth: 1971/08/30  Transition of Care [facility]) CM/SW Contact:    Kreg Shropshire, RN Phone Number: 05/29/2023, 11:18 AM   Clinical Narrative:    Transition of Care Asessment: Insurance and Status: Insurance coverage has been reviewed Patient has primary care physician: Yes   Prior level of function:: Independent Prior/Current Home Services: No current home services Social Determinants of Health Reivew: SDOH reviewed no interventions necessary Readmission risk has been reviewed: Yes Transition of care needs: no transition of care needs at this time

## 2023-05-29 NOTE — Inpatient Diabetes Management (Addendum)
Inpatient Diabetes Program Recommendations  AACE/ADA: New Consensus Statement on Inpatient Glycemic Control (2015)  Target Ranges:  Prepandial:   less than 140 mg/dL      Peak postprandial:   less than 180 mg/dL (1-2 hours)      Critically ill patients:  140 - 180 mg/dL    Latest Reference Range & Units 05/27/23 20:56  Sodium 135 - 145 mmol/L 140  Potassium 3.5 - 5.1 mmol/L 7.0 (HH)  Chloride 98 - 111 mmol/L 101  CO2 22 - 32 mmol/L 8 (L)  Glucose 70 - 99 mg/dL 1,610 (HH)  BUN 6 - 20 mg/dL 90 (H)  Creatinine 9.60 - 1.00 mg/dL 4.54 (H)  Calcium 8.9 - 10.3 mg/dL 09.8 (H)  Anion gap 5 - 15  31 (H)  (HH): Data is critically high (L): Data is abnormally low (H): Data is abnormally high  Latest Reference Range & Units 05/28/23 17:56 05/28/23 18:50 05/28/23 20:01 05/28/23 21:27 05/28/23 22:45 05/29/23 00:03 05/29/23 01:11 05/29/23 02:21 05/29/23 04:09  Glucose-Capillary 70 - 99 mg/dL 119 (H)  IV Insulin Drip Infusing 207 (H) 208 (H) 185 (H) 181 (H) 190 (H)  12 units Semglee 162 (H) 150 (H)  IV Insulin Drip Stopped 203 (H)  3 units Novolog   (H): Data is abnormally high  Latest Reference Range & Units 05/29/23 07:32  Glucose-Capillary 70 - 99 mg/dL 147 (H)  3 units Novolog  12 units Semglee  (H): Data is abnormally high   Admit with: DKA/ New Diagnosis Diabetes/ Hyperkalemia/ AKI  History: Asthma  Current Orders: Semglee 12 units Daily     Novolog Sensitive Correction Scale/ SSI (0-9 units) Q4 hours    PCP: Enid Skeens, NP with [facility]  Note pt transitioned to SQ Insulin this AM  CBG 248 at 8am  MD- May consider:  1. Increase Semglee to 15 units Daily (0.15 units/kg)  2. Increase Novolog SSI to the 0-15 unit Moderate scale    Addendum 11:40am--Met w/ pt at bedside (Mother and Daughter present at bedside as well).  Pt opening eyes and saying a few words but still not back to baseline mental status.  Dtr told me this is definitely not  normal mental status for pt.  Discussed w/ family that given all pt's metabolic issues on admission including severe dehydration and hyperglycemia, it may take a while to fully get back to baseline, however, pt already making some improvement.  Discussed w/ Dtr and Mother of pt that I have had OP Pharmacy run a benefits check in insulin for home and that Lantus and Novolog insulins are $0 co-pay.  Also relayed to family that pt may be able to use CGM for home glucose monitoring.  Will address insulin and CGM with pt when she is appropriate and back to baseline.  Discussed with family diagnosis of DKA (pathophysiology), treatment of DKA, lab results, and transition plan to SQ insulin regimen.  Spoke with family about new diagnosis.  Discussed A1C results with them and explained what an A1C is, basic pathophysiology of DM Type 2, basic home care, basic diabetes diet nutrition principles, importance of checking CBGs and maintaining good CBG control to prevent long-term and short-term complications.      RNs to provide ongoing basic DM education at bedside with this patient when more appropriate.  Have ordered educational booklet.  Have also placed RD consult for DM diet education for this patient.  Will meet w/ pt again when she is more  appropriate to discuss her new diagnosis and possible medications for home.      --Will follow patient during hospitalization--  Ambrose Finland RN, MSN, CDCES Diabetes Coordinator Inpatient Glycemic Control Team Team Pager: [phone] (8a-5p)

## 2023-05-29 NOTE — TOC Benefit Eligibility Note (Signed)
Pharmacy Patient Advocate Encounter  Insurance verification completed.    The patient is insured through [employer] test claim for Lantus Pen and the current 30 day co-pay is $0.00.  Ran test claim for Novolog Pen and the current 30 day co-pay is $0.00.  Ran test claim for [employer] and Requires Prior Authorization  Ran test claim for [employer] and Requires Prior Authorization   This test claim was processed through [employer]- copay amounts may vary at other pharmacies due to [employer], or as the patient moves through the different stages of their insurance plan.    Roland Earl, CPHT Pharmacy Patient Advocate Specialist [facility] Patient Advocate Team Direct Number: [phone]  Fax: [phone]

## 2023-05-30 ENCOUNTER — Inpatient Hospital Stay: Payer: 59

## 2023-05-30 ENCOUNTER — Encounter: Payer: Self-pay | Admitting: Family Medicine

## 2023-05-30 DIAGNOSIS — R8290 Unspecified abnormal findings in urine: Secondary | ICD-10-CM | POA: Diagnosis not present

## 2023-05-30 DIAGNOSIS — R7989 Other specified abnormal findings of blood chemistry: Secondary | ICD-10-CM

## 2023-05-30 DIAGNOSIS — E669 Obesity, unspecified: Secondary | ICD-10-CM | POA: Diagnosis present

## 2023-05-30 DIAGNOSIS — G9341 Metabolic encephalopathy: Secondary | ICD-10-CM | POA: Diagnosis not present

## 2023-05-30 DIAGNOSIS — A419 Sepsis, unspecified organism: Secondary | ICD-10-CM | POA: Diagnosis not present

## 2023-05-30 HISTORY — DX: Other specified abnormal findings of blood chemistry: R79.89

## 2023-05-30 LAB — BASIC METABOLIC PANEL
Anion gap: 6 (ref 5–15)
BUN: 35 mg/dL — ABNORMAL HIGH (ref 6–20)
CO2: 22 mmol/L (ref 22–32)
Calcium: 9.6 mg/dL (ref 8.9–10.3)
Chloride: 117 mmol/L — ABNORMAL HIGH (ref 98–111)
Creatinine, Ser: 1.05 mg/dL — ABNORMAL HIGH (ref 0.44–1.00)
GFR, Estimated: >60 mL/min (ref >60)
Glucose, Bld: 314 mg/dL — ABNORMAL HIGH (ref 70–99)
Potassium: 3.8 mmol/L (ref 3.5–5.1)
Sodium: 145 mmol/L (ref 135–145)

## 2023-05-30 LAB — CBC
HCT: 33.8 % — ABNORMAL LOW (ref 36.0–46.0)
Hemoglobin: 10.3 g/dL — ABNORMAL LOW (ref 12.0–15.0)
MCH: 24.1 pg — ABNORMAL LOW (ref 26.0–34.0)
MCHC: 30.5 g/dL (ref 30.0–36.0)
MCV: 79.2 fL — ABNORMAL LOW (ref 80.0–100.0)
Platelets: 276 10*3/uL (ref 150–400)
RBC: 4.27 MIL/uL (ref 3.87–5.11)
RDW: 15.8 % — ABNORMAL HIGH (ref 11.5–15.5)
WBC: 12.1 10*3/uL — ABNORMAL HIGH (ref 4.0–10.5)
nRBC: 0.2 % (ref 0.0–0.2)

## 2023-05-30 LAB — GLUCOSE, CAPILLARY
Glucose-Capillary: 302 mg/dL — ABNORMAL HIGH (ref 70–99)
Glucose-Capillary: 369 mg/dL — ABNORMAL HIGH (ref 70–99)
Glucose-Capillary: 190 mg/dL — ABNORMAL HIGH (ref 70–99)
Glucose-Capillary: 239 mg/dL — ABNORMAL HIGH (ref 70–99)

## 2023-05-30 LAB — RESP PANEL BY RT-PCR (RSV, FLU A&B, COVID)  RVPGX2
Influenza A by PCR: NEGATIVE
Influenza B by PCR: NEGATIVE
Resp Syncytial Virus by PCR: NEGATIVE
SARS Coronavirus 2 by RT PCR: NEGATIVE

## 2023-05-30 LAB — LACTIC ACID, PLASMA
Lactic Acid, Venous: 3 mmol/L (ref 0.5–1.9)
Lactic Acid, Venous: 1.6 mmol/L (ref 0.5–1.9)

## 2023-05-30 MED ORDER — LACTATED RINGERS IV BOLUS
1000.0000 mL | Freq: Once | INTRAVENOUS | Status: AC
  Administered 2023-05-30: 1000 mL via INTRAVENOUS

## 2023-05-30 MED ORDER — BUDESONIDE 0.25 MG/2ML IN SUSP
0.2500 mg | Freq: Two times a day (BID) | RESPIRATORY_TRACT | Status: DC
  Administered 2023-05-30 – 2023-06-01 (×4): 0.25 mg via RESPIRATORY_TRACT
  Filled 2023-05-30 (×4): qty 2

## 2023-05-30 MED ORDER — MORPHINE SULFATE (PF) 2 MG/ML IV SOLN
2.0000 mg | INTRAVENOUS | Status: DC | PRN
  Administered 2023-05-30 – 2023-06-01 (×7): 2 mg via INTRAVENOUS
  Filled 2023-05-30 (×7): qty 1

## 2023-05-30 MED ORDER — INSULIN ASPART 100 UNIT/ML IJ SOLN
3.0000 [IU] | Freq: Three times a day (TID) | INTRAMUSCULAR | Status: DC
  Administered 2023-05-30 – 2023-06-01 (×6): 3 [IU] via SUBCUTANEOUS
  Filled 2023-05-30 (×5): qty 1

## 2023-05-30 MED ORDER — INSULIN GLARGINE-YFGN 100 UNIT/ML ~~LOC~~ SOLN
18.0000 [IU] | Freq: Every day | SUBCUTANEOUS | Status: DC
  Administered 2023-05-30 – 2023-06-01 (×3): 18 [IU] via SUBCUTANEOUS
  Filled 2023-05-30 (×3): qty 0.18

## 2023-05-30 MED ORDER — IOHEXOL 350 MG/ML SOLN
75.0000 mL | Freq: Once | INTRAVENOUS | Status: AC | PRN
  Administered 2023-05-30: 75 mL via INTRAVENOUS

## 2023-05-30 MED ORDER — ALBUTEROL SULFATE HFA 108 (90 BASE) MCG/ACT IN AERS: 2.0000 | INHALATION_SPRAY | Freq: Four times a day (QID) | RESPIRATORY_TRACT | Status: DC | PRN

## 2023-05-30 NOTE — Assessment & Plan Note (Signed)
Body mass index is 37.42 kg/m. Complicates overall care and prognosis.  Recommend lifestyle modifications including physical activity and diet for weight loss and overall long-term health.

## 2023-05-30 NOTE — Progress Notes (Addendum)
Progress Note   Patient: Frances Rodgers VOZ:[id number] DOB: [birth year] DOA: 05/27/2023     3 DOS: the patient was seen and examined on 05/30/2023   Brief hospital course: HPI on admission 05/27/23 PM by Dr. Arville Care: "[name] is a 52 y.o. female with medical history significant for asthma, and GERD, who presented to the emergency room with acute onset of altered mental status with unresponsiveness.  The patient's family last talked to her around 5 PM last night.  When she did not respond they went to her home.  They found her with her pants around the ankle and she was fairly confused upon arrival of EMS with tachypnea.  When she came to the ER she was awake and alert but not responding much.  She was fairly somnolent and fatigued.  Blood glucose was noted to be more than 600 on arrival and she has no previous history of diabetes mellitus.  The patient was still somnolent during my interview and therefore no further history could be obtained. "  Vitals in ER - HR 107, RR 28, temp 99 Labs were consistent with severe DKA. GLUCOSE 1055, POTASSIUM 7.0, bicarb 8, anion gap 31.  Creatinine 3.01 (normal baseline renal function).  CT head was non-acute.  Patient was admitted to stepdown unit and started on DKA protocol with IV insulin.  7/14: glucose improving.  Pt awake but non-verbal, does follow commands appropriately.  Gap closed x 2, transitioning to subcutaneous insulin.  Family visiting - report pt recently treated with steroids with chronic shoulder pain.  Confirm pt has no prior dx of diabetes but significant family history in multiple.  7/15: pt verbal today, able to answer questions, but mentation still not at baseline.  Started antibiotics due to fever / sepsis.  Follow cultures.  7/16: lactate elevated, given fluids per sepsis.  Mentation further improved.  Stable for transfer to med/tele floor.  PM addendum -- will check viral swabs for covid, flu, rsv and full viral panel.  Still  having fevers and tachycardia despite antibiotics, only symptom mild sore throat.   Assessment and Plan: * DKA (diabetic ketoacidosis) (HCC) DKA resolved. Glucose was 1055 on admission.  Hbg A1c 11.7% (up from 5.1 just 8 months ago).  Recently on prednisone and steroid injections to shoulder. No prior diagnosis of diabetes, but +family history Transitioned off insulin drip --Increase Semglee to 18 units daily  --Add 3 units Novolog TID WC --Continue moderate sliding scale Novolog  --Appreciate diabetes coordinator input & pt education --Pt will need insulin at d/c --Will set pt up with CGM prior to d/c  Sepsis (HCC) Clinically undetermined if sepsis was present on admission due to DKA.  7/15 - DKA now resolved, but pt having tachycardia, tachypnea, fever 100.8.  With abnormal UA on admission, likely sepsis due to UTI --Collect blood cultures x 2, lactic acid, procalcitonin --Lactate 3.5 >> 3.0 this AM --1 L bolus LR & continue maintenance LR per orders --Trend lactate --Continue empiric Rocephin for suspected UTI --Follow cultures --Monitor fever curve, HR, CBC's  Hypernatremia Now Resolved.   Na 140 on admission, give aggressive NS fluids per DKA protocol.   Sodium peaked at 156. Treated with dextrose fluids. --Encourage pt to drink water --Serial BMP's to monitor closely  Acute metabolic encephalopathy POA.  Improving slowly.   Due to DKA and other metabolic derangements. 7/14 - pt non-verbal but follows commands 7/15 - mentation improving, pt verbal today but delayed answers to yes/no questions 7/16 -  more talkative, improving --Mgmt of underlying issues as outlined --Delirium precautions --Neuro checks  Hyperkalemia Resolved.   K 7.0 on admission, in setting of AKI and DKA.  Treated and resolved. --Monitor BMP's  Abnormal urinalysis Initial UA showed cloudy appearance, >500 glucose, 100 protein, small leukocytes, negative nitrite, rare bacteria, 21-50 wbc's,  6-10 epi cells, notably present yeast, hyaline casts and mucus.   --Urine culture grew multiple species --Repeat UA & culture ordered --Continue empiric Rocephin given sepsis without other clear source --Hold off antibiotics and monitor for now --Now has Foley for acute urinary retention  Acute urinary retention Continue in/out cath's if > 350 on bladder scan or pt feels need void & not able. --Foley placed --Voiding trial once pt clinically improved  AKI (acute kidney injury) (HCC) Cr on admission 3.01, baseline normal. Renal function improving with hydration. Cr today 1.05 --Monitor BMP --Avoid nephrotoxins, hypotension --Renally dose meds  Shoulder pain, left Chronic. Left shoulder adhesive capsulitis Chart reviewed - seen by Ortho 6/7 and 6/21 and diagnosed with adhesive capsulitis.  Given injection of steroid and lidocaine at both visits. Daughter reports she was also treated with a course of prednisone. Steroid exposure likely precipitated DKA in pt without prior diagnosis of diabetes. --Supportive care, pain control --OT evaluation when clinically improved --Follow up with Ortho as scheduled  GERD without esophagitis --Continue PPI   Asthma Stable, not exacerbated. --Pulmicort nebs sub'd for home Qvar --Continue Singulair  --PRN albuterol nebs or inhaler  Obesity (BMI 30-39.9) Body mass index is 37.42 kg/m. Complicates overall care and prognosis.  Recommend lifestyle modifications including physical activity and diet for weight loss and overall long-term health.  Positive D dimer D-dimer 4.74 on 7/15. CTA chest ruled out PE --Follow up pending U/S lower extremities  Elevated blood pressure reading I don't see formal dx of hypertension, but BP's have been elevated. Pt is getting aggressive IV hydrations, likely contributes. --Started on amlodipine 10 mg PO daily --PO or IV hydralazine PRN if SBP>160 --PCP follow up        Subjective: Pt seen in  stepdown this AM.  She reports feeling okay.  States having a little bit of sore throat.  Denies cough, feeling short of breath, having chest pain, abdominal or suprapubic pain, dysuria before admission.   Denies feel feverish or chilled.   Physical Exam: Vitals:   05/30/23 0900 05/30/23 1000 05/30/23 1100 05/30/23 1455  BP: 112/64 116/77 122/87 104/60  Pulse: (!) 103 (!) 110 (!) 111 (!) 101  Resp: (!) 24 (!) 21 (!) 30 16  Temp:    98.6 F (37 C)  TempSrc:    Oral  SpO2: 97% 97% 97% 96%  Weight:      Height:       General exam: awake, alert, no acute distress, improved engagement in conversation HEENT: moist mucus membranes, hearing grossly normal  Respiratory system: CTAB, no wheezes or rhonchi, normal respiratory effort. Cardiovascular system: normal S1/S2, RRR, trace BLE edema.   Gastrointestinal system: soft, NT, ND Central nervous system: normal speech, grossly non-focal, follows commands Extremities: trace BLE edema, normal tone Skin: dry, intact, normal temperature Psychiatry: normal mood, flat affect, judgment and insight seem intact    Data Reviewed:  Most recent BMP's ---  Sodium normalized 145, Cl 117, glucose 314, BUN 35, Cr 1.05 improving WBC 12.1 improved, Hbg 10.3 stable Lactic acid 1.9 >> 3.5 >> 3.0  D-dimer 7/15 elevated 4.74 CTA chest Negative for PE.  Showed mild b/l posterior basilar  atelectasis  Recent CBG's 270 >> 302 >> 369  Family Communication: sister and daughter updated at bedside 7/14.  None present today, will attempt to call.  Disposition: Status is: Inpatient Remains inpatient appropriate because: severity of illness as above, remains on IV therapies   Planned Discharge Destination: Home    Time spent: 46 minutes  Author: Pennie Banter, DO 05/30/2023 3:59 PM  For on call review [email or web address].

## 2023-05-30 NOTE — Assessment & Plan Note (Signed)
D-dimer 4.74 on 7/15. CTA chest ruled out PE --Follow up pending U/S lower extremities

## 2023-05-30 NOTE — Progress Notes (Signed)
Patient came from the ICU with a pink limb alert bracelet on her right arm. Pt stated the IV had infiltrated in the ICU. She told me that no one is to do any IV placements or draw blood from that arm. The ICU nurse did not report any infiltrated IV to the inpatient transfer nurse.

## 2023-05-31 ENCOUNTER — Ambulatory Visit (HOSPITAL_BASED_OUTPATIENT_CLINIC_OR_DEPARTMENT_OTHER): Payer: 59 | Admitting: Orthopaedic Surgery

## 2023-05-31 ENCOUNTER — Encounter: Payer: Self-pay | Admitting: Family Medicine

## 2023-05-31 ENCOUNTER — Other Ambulatory Visit: Payer: Self-pay

## 2023-05-31 DIAGNOSIS — E1110 Type 2 diabetes mellitus with ketoacidosis without coma: Secondary | ICD-10-CM | POA: Diagnosis not present

## 2023-05-31 LAB — GLUCOSE, CAPILLARY
Glucose-Capillary: 258 mg/dL — ABNORMAL HIGH (ref 70–99)
Glucose-Capillary: 298 mg/dL — ABNORMAL HIGH (ref 70–99)
Glucose-Capillary: 272 mg/dL — ABNORMAL HIGH (ref 70–99)
Glucose-Capillary: 171 mg/dL — ABNORMAL HIGH (ref 70–99)

## 2023-05-31 LAB — CBC
HCT: 31.5 % — ABNORMAL LOW (ref 36.0–46.0)
Hemoglobin: 9.9 g/dL — ABNORMAL LOW (ref 12.0–15.0)
MCH: 23.8 pg — ABNORMAL LOW (ref 26.0–34.0)
MCHC: 31.4 g/dL (ref 30.0–36.0)
MCV: 75.7 fL — ABNORMAL LOW (ref 80.0–100.0)
Platelets: 248 10*3/uL (ref 150–400)
RBC: 4.16 MIL/uL (ref 3.87–5.11)
RDW: 15.8 % — ABNORMAL HIGH (ref 11.5–15.5)
WBC: 11.9 10*3/uL — ABNORMAL HIGH (ref 4.0–10.5)
nRBC: 0.3 % — ABNORMAL HIGH (ref 0.0–0.2)

## 2023-05-31 LAB — BASIC METABOLIC PANEL
Anion gap: 10 (ref 5–15)
BUN: 25 mg/dL — ABNORMAL HIGH (ref 6–20)
CO2: 18 mmol/L — ABNORMAL LOW (ref 22–32)
Calcium: 9 mg/dL (ref 8.9–10.3)
Chloride: 110 mmol/L (ref 98–111)
Creatinine, Ser: 0.88 mg/dL (ref 0.44–1.00)
GFR, Estimated: >60 mL/min (ref >60)
Glucose, Bld: 302 mg/dL — ABNORMAL HIGH (ref 70–99)
Potassium: 4.2 mmol/L (ref 3.5–5.1)
Sodium: 138 mmol/L (ref 135–145)

## 2023-05-31 LAB — RESPIRATORY PANEL BY PCR

## 2023-05-31 NOTE — Inpatient Diabetes Management (Signed)
Inpatient Diabetes Program Recommendations  AACE/ADA: New Consensus Statement on Inpatient Glycemic Control (2015)  Target Ranges:  Prepandial:   less than 140 mg/dL      Peak postprandial:   less than 180 mg/dL (1-2 hours)      Critically ill patients:  140 - 180 mg/dL   Lab Results  Component Value Date   GLUCAP 298 (H) 05/31/2023   HGBA1C 11.7 (H) 05/28/2023     Discharge Recommendations: Other recommendations: Follow up with PCP every 3 months.  Call PCP if glucose is consistently <100 mg/dL or > 161 mg/dL Long acting recommendations: Insulin Glargine (LANTUS) Solostar Pen 22 units QHS  Short acting recommendations:  Meal coverage ONLY Insulin lispro (HUMALOG) KwikPen  6 units TID with meals   Supply/Referral recommendations: Glucometer Test strips Lancet device Lancets Pen needles - standard  Spoke with patient and daughter at bedside.  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  Spoke with pt about new diagnosis. Discussed A1C results with them and explained what an A1C is, basic pathophysiology of DM Type 2, basic home care, basic diabetes diet nutrition principles, importance of checking CBGs and maintaining good CBG control to prevent long-term and short-term complications. Reviewed signs and symptoms of hyperglycemia and hypoglycemia and how to treat hypoglycemia at home. Also reviewed blood sugar goals at home.  RNs to provide ongoing basic DM education at bedside with this patient. Have ordered educational booklet, insulin starter kit.    Educated patient on insulin pen use at home. Reviewed contents of insulin flexpen starter kit. Reviewed all steps of insulin pen including attachment of needle, 2-unit air shot, dialing up dose, giving injection, removing needle, disposal of sharps, storage of unused insulin, disposal of insulin etc. Patient able to provide successful return demonstration. Also reviewed troubleshooting  with insulin pen. MD to give patient Rxs for insulin pens and insulin pen needles.  She is interested in a CGM but her [employer] is not working.  Once she gets her phone replaced she should speak with her PCP about obtaining a CGM.    Educated on The Plate Method, CHO's, portion control, CBGs at home fasting and mid afternoon, F/U with PCP every 3 months, bring meter to PCP office, long and short term complications of uncontrolled BG, and importance of exercise.  Benefit check shows $0 co-pay for Humalog and Lantus insulin pens.    Marland Kitchenjm

## 2023-05-31 NOTE — Progress Notes (Signed)
PROGRESS NOTE    Frances Rodgers  UJW:[id number] DOB: [birth year] DOA: 05/27/2023 PCP: Tollie Eth, NP  [id number]  LOS: 4 days   Brief hospital course:  Frances Rodgers is a 52 y.o. female with medical history significant for asthma, and GERD, who presented to the emergency room with acute onset of altered mental status with unresponsiveness.  The patient's family last talked to her around 5 PM last night.  When she did not respond they went to her home.  They found her with her pants around the ankle and she was fairly confused upon arrival of EMS with tachypnea.  When she came to the ER she was awake and alert but not responding much.  She was fairly somnolent and fatigued.  Blood glucose was noted to be more than 600 on arrival and she has no previous history of diabetes mellitus.  The patient was still somnolent during my interview and therefore no further history could be obtained. "   Vitals in ER - HR 107, RR 28, temp 99 Labs were consistent with severe DKA. GLUCOSE 1055, POTASSIUM 7.0, bicarb 8, anion gap 31.  Creatinine 3.01 (normal baseline renal function).  CT head was non-acute.   Patient was admitted to stepdown unit and started on DKA protocol with IV insulin.   7/14: glucose improving.  Pt awake but non-verbal, does follow commands appropriately.  Gap closed x 2, transitioning to subcutaneous insulin.  Family visiting - report pt recently treated with steroids with chronic shoulder pain.  Confirm pt has no prior dx of diabetes but significant family history in multiple.   7/15: pt verbal today, able to answer questions, but mentation still not at baseline.  Started antibiotics due to fever / sepsis.  Follow cultures.   7/16: lactate elevated, given fluids per sepsis.  Mentation further improved.  Stable for transfer to med/tele floor.   PM addendum -- will check viral swabs for covid, flu, rsv and full viral panel.  Still having fevers and tachycardia despite antibiotics,  only symptom mild sore throat.     Assessment and Plan: * DKA (diabetic ketoacidosis) (HCC) DKA resolved. Glucose was 1055 on admission.  Hbg A1c 11.7% (up from 5.1 just 8 months ago).  Recently on prednisone and steroid injections to shoulder. No prior diagnosis of diabetes, but +family history Transitioned off insulin drip --cont glargine 18 daily --3 units Novolog TID WC --SSI --insulin injection teaching today   Sepsis (HCC) Clinically undetermined if sepsis was present on admission due to DKA.  7/15 - DKA now resolved, but pt having tachycardia, tachypnea, fever 100.8.  With abnormal UA on admission, likely sepsis due to UTI --cont ceftriaxone   Hypernatremia Now Resolved.   Na 140 on admission, give aggressive NS fluids per DKA protocol.   Sodium peaked at 156. Treated with dextrose fluids. --encourage oral hydration   Acute metabolic encephalopathy POA.  Improving slowly.   Due to DKA and other metabolic derangements. 7/14 - pt non-verbal but follows commands 7/15 - mentation improving, pt verbal today but delayed answers to yes/no questions 7/16 - more talkative, improving --Mgmt of underlying issues as outlined --Delirium precautions   Hyperkalemia Resolved.   K 7.0 on admission, in setting of AKI and DKA.  Treated and resolved. --Monitor BMP's   Acute urinary retention --Foley placed --Foley removed today, pt able to void   AKI (acute kidney injury) (HCC) Cr on admission 3.01, baseline normal. Renal function improving with hydration. Cr 1.05 --oral hydration  Shoulder pain, left Chronic. Left shoulder adhesive capsulitis Chart reviewed - seen by Ortho 6/7 and 6/21 and diagnosed with adhesive capsulitis.  Given injection of steroid and lidocaine at both visits. Daughter reports she was also treated with a course of prednisone. Steroid exposure likely precipitated DKA in pt without prior diagnosis of diabetes. --Supportive care, pain control --OT  evaluation when clinically improved --Follow up with Ortho as scheduled   GERD without esophagitis --Continue PPI    Asthma Stable, not exacerbated. --Pulmicort nebs sub'd for home Qvar --Continue Singulair  --PRN albuterol nebs or inhaler   Obesity (BMI 30-39.9) Body mass index is 37.42 kg/m. Complicates overall care and prognosis.  Recommend lifestyle modifications including physical activity and diet for weight loss and overall long-term health.   Elevated blood pressure  I don't see formal dx of hypertension, but BP's have been elevated. Pt is getting aggressive IV hydrations, likely contributes. --cont amlodipine (new)    DVT prophylaxis: Lovenox SQ Code Status: Full code  Family Communication: daughter updated at bedside today Level of care: Telemetry Medical Dispo:   The patient is from: home Anticipated d/c is to: home Anticipated d/c date is: tomorrow   Subjective and Interval History:  Pt reported feeling and doing better.  Has been out of bed.   Objective: Vitals:   05/31/23 1115 05/31/23 1500 05/31/23 1947 05/31/23 2043  BP:  106/81 113/72   Pulse: (!) 103 89 92   Resp:  16 18   Temp:  98.2 F (36.8 C) 98.2 F (36.8 C)   TempSrc:  Oral Oral   SpO2:  99% 96% 96%  Weight:      Height:        Intake/Output Summary (Last 24 hours) at 05/31/2023 2330 Last data filed at 05/31/2023 1155 Gross per 24 hour  Intake --  Output 1100 ml  Net -1100 ml   Filed Weights   05/27/23 2105  Weight: 102 kg    Examination:   Constitutional: NAD, AAOx3 HEENT: conjunctivae and lids normal, EOMI CV: No cyanosis.   RESP: normal respiratory effort, on RA Neuro: II - XII grossly intact.   Psych: Normal mood and affect.  Appropriate judgement and reason   Data Reviewed: I have personally reviewed labs and imaging studies  Time spent: 50 minutes  Darlin Priestly, MD [facility] If 7PM-7AM, please contact night-coverage 05/31/2023, 11:30 PM

## 2023-05-31 NOTE — Inpatient Diabetes Management (Signed)
Inpatient Diabetes Program Recommendations  AACE/ADA: New Consensus Statement on Inpatient Glycemic Control (2015)  Target Ranges:  Prepandial:   less than 140 mg/dL      Peak postprandial:   less than 180 mg/dL (1-2 hours)      Critically ill patients:  140 - 180 mg/dL   Lab Results  Component Value Date   GLUCAP 239 (H) 05/30/2023   HGBA1C 11.7 (H) 05/28/2023    Review of Glycemic Control  Latest Reference Range & Units 05/30/23 08:19 05/30/23 11:26 05/30/23 17:26 05/30/23 21:10 05/31/23 08:32  Glucose-Capillary 70 - 99 mg/dL 425 (H) 956 (H) 387 (H) 239 (H) 258 (H)  (H): Data is abnormally high Diabetes history: New DM2  Current orders for Inpatient glycemic control:  Semglee 18 units at bedtime Novolog 0-15 units TID and 0-5 units at bedtime Novolog 3 units TID  Inpatient Diabetes Program Recommendations:    1) Semglee 22 units at bedtime 2 Novolog 5 units TID with meals if she consumes at least 50%  Will continue to follow while inpatient.  Thank you, Dulce Sellar, MSN, CDCES Diabetes Coordinator Inpatient Diabetes Program [phone] (team pager from 8a-5p)

## 2023-05-31 NOTE — Progress Notes (Signed)
   05/31/23 0900  Assess: MEWS Score  Temp 97.8 F (36.6 C)  BP 103/70  MAP (mmHg) 81  Pulse Rate (!) 118  Resp 16  SpO2 96 %  O2 Device Room Air  Assess: MEWS Score  MEWS Temp 0  MEWS Systolic 0  MEWS Pulse 2  MEWS RR 0  MEWS LOC 0  MEWS Score 2  MEWS Score Color Yellow  Assess: if the MEWS score is Yellow or Red  Were vital signs taken at a resting state? Yes  Focused Assessment No change from prior assessment  Does the patient meet 2 or more of the SIRS criteria? No  MEWS guidelines implemented  No, other (Comment)  Notify: Charge Nurse/RN  Name of Charge Nurse/RN Community education officer  Assess: SIRS CRITERIA  SIRS Temperature  0  SIRS Pulse 1  SIRS Respirations  0  SIRS WBC 0  SIRS Score Sum  1

## 2023-05-31 NOTE — Plan of Care (Signed)

## 2023-06-01 ENCOUNTER — Encounter: Payer: Self-pay | Admitting: Cardiology

## 2023-06-01 ENCOUNTER — Ambulatory Visit: Payer: 59 | Attending: Cardiology | Admitting: Cardiology

## 2023-06-01 DIAGNOSIS — E1110 Type 2 diabetes mellitus with ketoacidosis without coma: Secondary | ICD-10-CM | POA: Diagnosis not present

## 2023-06-01 LAB — CBC
HCT: 35.2 % — ABNORMAL LOW (ref 36.0–46.0)
Hemoglobin: 10.8 g/dL — ABNORMAL LOW (ref 12.0–15.0)
MCH: 23.7 pg — ABNORMAL LOW (ref 26.0–34.0)
MCHC: 30.7 g/dL (ref 30.0–36.0)
MCV: 77.2 fL — ABNORMAL LOW (ref 80.0–100.0)
Platelets: 276 10*3/uL (ref 150–400)
RBC: 4.56 MIL/uL (ref 3.87–5.11)
RDW: 15.7 % — ABNORMAL HIGH (ref 11.5–15.5)
WBC: 10.1 10*3/uL (ref 4.0–10.5)
nRBC: 0.3 % — ABNORMAL HIGH (ref 0.0–0.2)

## 2023-06-01 LAB — BASIC METABOLIC PANEL
Anion gap: 11 (ref 5–15)
BUN: 23 mg/dL — ABNORMAL HIGH (ref 6–20)
CO2: 20 mmol/L — ABNORMAL LOW (ref 22–32)
Calcium: 8.9 mg/dL (ref 8.9–10.3)
Chloride: 106 mmol/L (ref 98–111)
Creatinine, Ser: 0.75 mg/dL (ref 0.44–1.00)
GFR, Estimated: >60 mL/min (ref >60)
Glucose, Bld: 247 mg/dL — ABNORMAL HIGH (ref 70–99)
Potassium: 3.3 mmol/L — ABNORMAL LOW (ref 3.5–5.1)
Sodium: 137 mmol/L (ref 135–145)

## 2023-06-01 LAB — MAGNESIUM: Magnesium: 1.8 mg/dL (ref 1.7–2.4)

## 2023-06-01 LAB — GLUCOSE, CAPILLARY: Glucose-Capillary: 283 mg/dL — ABNORMAL HIGH (ref 70–99)

## 2023-06-01 MED ORDER — LANCET DEVICE MISC: 1.0000 | Freq: Three times a day (TID) | 0 refills | Status: DC

## 2023-06-01 MED ORDER — PEN NEEDLES 31G X 5 MM MISC: 1.0000 | Freq: Three times a day (TID) | 0 refills | Status: DC

## 2023-06-01 MED ORDER — POTASSIUM CHLORIDE CRYS ER 20 MEQ PO TBCR
40.0000 meq | EXTENDED_RELEASE_TABLET | Freq: Once | ORAL | Status: AC
  Administered 2023-06-01: 40 meq via ORAL
  Filled 2023-06-01: qty 2

## 2023-06-01 MED ORDER — BLOOD GLUCOSE TEST VI STRP: 1.0000 | ORAL_STRIP | Freq: Three times a day (TID) | 0 refills | Status: DC

## 2023-06-01 MED ORDER — BLOOD GLUCOSE MONITORING SUPPL DEVI: 1.0000 | Freq: Three times a day (TID) | 0 refills | Status: DC

## 2023-06-01 MED ORDER — INSULIN LISPRO (1 UNIT DIAL) 100 UNIT/ML (KWIKPEN): 6.0000 [IU] | PEN_INJECTOR | Freq: Three times a day (TID) | SUBCUTANEOUS | 2 refills | Status: DC

## 2023-06-01 MED ORDER — LANCETS MISC: 1.0000 | Freq: Three times a day (TID) | 0 refills | Status: DC

## 2023-06-01 MED ORDER — INSULIN GLARGINE 100 UNIT/ML SOLOSTAR PEN: 22.0000 [IU] | PEN_INJECTOR | Freq: Every day | SUBCUTANEOUS | 2 refills | Status: DC

## 2023-06-01 NOTE — Discharge Summary (Signed)
Physician Discharge Summary   Frances Rodgers  female DOB: [birth year]  UXL:[id number]  PCP: Tollie Eth, NP  Admit date: 05/27/2023 Discharge date: 06/01/2023  Admitted From: home Disposition:  home CODE STATUS: Full code  Discharge Instructions     Amb Referral to Nutrition and Diabetic Education   Complete by: As directed    Diet Carb Modified   Complete by: As directed       Hospital Course:  For full details, please see H&P, progress notes, consult notes and ancillary notes.  Briefly,  Frances Rodgers is a 52 y.o. female with medical history significant for asthma, who presented to the emergency room with acute onset of altered mental status.    Labs were consistent with severe DKA. GLUCOSE 1055, POTASSIUM 7.0, bicarb 8, anion gap 31.  Creatinine 3.01 (normal baseline renal function).     Patient was admitted to stepdown unit and started on DKA protocol with IV insulin.   * DKA (diabetic ketoacidosis) (HCC) Hbg A1c 11.7% (up from 5.1 just 8 months ago).  Recently on prednisone and steroid injections to shoulder. No prior diagnosis of diabetes, but +family history Transitioned off insulin drip --pt received diabetic teaching and discharged on Lantus 22u nightly and Humalog 6u TID with meals.  Sepsis (HCC) Clinically undetermined if sepsis was present on admission due to DKA.  7/15 - DKA resolved, but pt having tachycardia, tachypnea, fever 100.8.  With abnormal UA on admission, likely sepsis due to UTI, although urine cx grew multiple species. --Pt received 3 days of empiric ceftriaxone   Hypernatremia, resolved Sodium peaked at 156. Treated with dextrose fluids.  Na 137 prior to discharge.   Acute metabolic encephalopathy, POA Improved slowly, mental status back to baseline prior to discharge.  Due to DKA and other metabolic derangements.   Hyperkalemia, resolved K 7.0 on admission, in setting of AKI and DKA.  Treated and resolved.   Acute urinary  retention --had Foley cath for decompression.  Foley removed and pt was able to void.   AKI (acute kidney injury) (HCC) Cr on admission 3.01, baseline normal. Renal function improving with hydration. Cr 0.75 prior to discharge.   Shoulder pain, left Chronic. Left shoulder adhesive capsulitis Chart reviewed - seen by Ortho 6/7 and 6/21 and diagnosed with adhesive capsulitis.  Given injection of steroid and lidocaine at both visits. Daughter reports she was also treated with a course of prednisone. --Follow up with Ortho as scheduled   GERD without esophagitis --Continue PPI    Asthma Stable, not exacerbated. --cont home bronchodilators    Obesity (BMI 30-39.9) Body mass index is 37.42 kg/m. Complicates overall care and prognosis.  Recommend lifestyle modifications including physical activity and diet for weight loss and overall long-term health.   Elevated blood pressure, resolved No formal dx of hypertension. BP elevated during initial hospitalization, treated with amlodipine, however, BP decreased prior to discharge, so amlodipine was not continued.   Unless noted above, medications under "STOP" list are ones pt was not taking PTA.  Discharge Diagnoses:  Principal Problem:   DKA (diabetic ketoacidosis) (HCC) Active Problems:   Hyperkalemia   Acute metabolic encephalopathy   Hypernatremia   Sepsis (HCC)   Shoulder pain, left   AKI (acute kidney injury) (HCC)   Acute urinary retention   Abnormal urinalysis   Asthma   GERD without esophagitis   Elevated blood pressure reading   Positive D dimer   Obesity (BMI 30-39.9)   30 Day Unplanned Readmission  Risk Score    Flowsheet Row ED to Hosp-Admission (Current) from 05/27/2023 in [facility]  30 Day Unplanned Readmission Risk Score (%) 16.8 Filed at 06/01/2023 0801       This score is the patient's risk of an unplanned readmission within 30 days of being discharged (0 -100%).  The score is based on dignosis, age, lab data, medications, orders, and past utilization.   Low:  0-14.9   Medium: 15-21.9   High: 22-29.9   Extreme: 30 and above         Discharge Instructions:  Allergies as of 06/01/2023       Reactions   Aspirin Anaphylaxis, Shortness Of Breath   Ketorolac Anaphylaxis   Ketorolac Tromethamine Shortness Of Breath        Medication List     STOP taking these medications    EPINEPHrine 0.3 mg/0.3 mL Soaj injection Commonly known as: EPI-PEN   methylPREDNISolone 4 MG Tbpk tablet Commonly known as: MEDROL DOSEPAK       TAKE these medications    albuterol 108 (90 Base) MCG/ACT inhaler Commonly known as: VENTOLIN HFA Inhale into the lungs every 6 (six) hours as needed for wheezing or shortness of breath.   Blood Glucose Monitoring Suppl Devi 1 each by Does not apply route 3 (three) times daily. May dispense any manufacturer covered by patient's insurance.   BLOOD GLUCOSE TEST STRIPS Strp 1 each by Does not apply route 3 (three) times daily. Use as directed to check blood sugar. May dispense any manufacturer covered by patient's insurance and fits patient's device.   Cetirizine HCl 10 MG Caps Take 10 mg by mouth daily as needed.   Flovent Diskus 50 MCG/ACT Aepb Generic drug: Fluticasone Propionate (Inhal) Take 1 Inhalation by mouth every 12 (twelve) hours as needed.   insulin glargine 100 UNIT/ML Solostar Pen Commonly known as: LANTUS Inject 22 Units into the skin at bedtime. May substitute as needed per insurance.   insulin lispro 100 UNIT/ML KwikPen Commonly known as: HUMALOG Inject 6 Units into the skin 3 (three) times daily with meals. Only take if eating a meal AND Blood Glucose (BG) is 80 or higher.   Junel Fe 24 1-20 MG-MCG(24) tablet Generic drug: Norethindrone Acetate-Ethinyl Estrad-FE TAKE 1 TABLET BY MOUTH DAILY   Lancet Device Misc 1 each by Does not apply route 3 (three) times daily. May dispense any  manufacturer covered by patient's insurance.   Lancets Misc 1 each by Does not apply route 3 (three) times daily. Use as directed to check blood sugar. May dispense any manufacturer covered by patient's insurance and fits patient's device.   montelukast 10 MG tablet Commonly known as: SINGULAIR Take 10 mg by mouth at bedtime.   MULTI VITAMIN DAILY PO Take 1 tablet by mouth daily.   pantoprazole 40 MG tablet Commonly known as: PROTONIX ONE TABLE 30 MINUTES PRIOR TO FIRST MEAL OF DAY   Pen Needles 31G X 5 MM Misc 1 each by Does not apply route 3 (three) times daily. May dispense any manufacturer covered by patient's insurance.   Qvar RediHaler 40 MCG/ACT inhaler Generic drug: beclomethasone Inhale 1 puff into the lungs 2 (two) times daily.         Follow-up Information     Early, Sung Amabile, NP Follow up in 1 week(s).   Specialty: Nurse Practitioner Contact information: [address] [place] [address] [phone]  Allergies  Allergen Reactions   Aspirin Anaphylaxis and Shortness Of Breath   Ketorolac Anaphylaxis   Ketorolac Tromethamine Shortness Of Breath     The results of significant diagnostics from this hospitalization (including imaging, microbiology, ancillary and laboratory) are listed below for reference.   Consultations:   Procedures/Studies: US Venous Img Lower Bilateral (DVT)  Result Date: 05/30/2023 CLINICAL DATA:  Elevated D-dimer.  Evaluate for DVT. EXAM: BILATERAL LOWER EXTREMITY VENOUS DOPPLER ULTRASOUND TECHNIQUE: Gray-scale sonography with graded compression, as well as color Doppler and duplex ultrasound were performed to evaluate the lower extremity deep venous systems from the level of the common femoral vein and including the common femoral, femoral, profunda femoral, popliteal and calf veins including the posterior tibial, peroneal and gastrocnemius veins when visible. The superficial great saphenous vein was  also interrogated. Spectral Doppler was utilized to evaluate flow at rest and with distal augmentation maneuvers in the common femoral, femoral and popliteal veins. COMPARISON:  None Available. FINDINGS: RIGHT LOWER EXTREMITY Common Femoral Vein: No evidence of thrombus. Normal compressibility, respiratory phasicity and response to augmentation. Saphenofemoral Junction: No evidence of thrombus. Normal compressibility and flow on color Doppler imaging. Profunda Femoral Vein: No evidence of thrombus. Normal compressibility and flow on color Doppler imaging. Femoral Vein: No evidence of thrombus. Normal compressibility, respiratory phasicity and response to augmentation. Popliteal Vein: No evidence of thrombus. Normal compressibility, respiratory phasicity and response to augmentation. Calf Veins: No evidence of thrombus. Normal compressibility and flow on color Doppler imaging. Superficial Great Saphenous Vein: No evidence of thrombus. Normal compressibility. Other Findings:  None. LEFT LOWER EXTREMITY Common Femoral Vein: No evidence of thrombus. Normal compressibility, respiratory phasicity and response to augmentation. Saphenofemoral Junction: No evidence of thrombus. Normal compressibility and flow on color Doppler imaging. Profunda Femoral Vein: No evidence of thrombus. Normal compressibility and flow on color Doppler imaging. Femoral Vein: No evidence of thrombus. Normal compressibility, respiratory phasicity and response to augmentation. Popliteal Vein: No evidence of thrombus. Normal compressibility, respiratory phasicity and response to augmentation. Calf Veins: No evidence of thrombus. Normal compressibility and flow on color Doppler imaging. Superficial Great Saphenous Vein: No evidence of thrombus. Normal compressibility. Other Findings:  None. IMPRESSION: No evidence of DVT within either lower extremity. Electronically Signed   By: Simonne Come M.D.   On: 05/30/2023 15:04   CT Angio Chest Pulmonary  Embolism (PE) W or WO Contrast  Result Date: 05/30/2023 CLINICAL DATA:  High probability of pulmonary embolism. EXAM: CT ANGIOGRAPHY CHEST WITH CONTRAST TECHNIQUE: Multidetector CT imaging of the chest was performed using the standard protocol during bolus administration of intravenous contrast. Multiplanar CT image reconstructions and MIPs were obtained to evaluate the vascular anatomy. RADIATION DOSE REDUCTION: This exam was performed according to the departmental dose-optimization program which includes automated exposure control, adjustment of the mA and/or kV according to patient size and/or use of iterative reconstruction technique. CONTRAST:  75mL OMNIPAQUE IOHEXOL 350 MG/ML SOLN COMPARISON:  None Available. FINDINGS: Cardiovascular: Satisfactory opacification of the pulmonary arteries to the segmental level. No evidence of pulmonary embolism. Normal heart size. No pericardial effusion. Mediastinum/Nodes: No enlarged mediastinal, hilar, or axillary lymph nodes. Thyroid gland, trachea, and esophagus demonstrate no significant findings. Lungs/Pleura: No pneumothorax or pleural effusion is noted. Mild bilateral posterior basilar subsegmental atelectasis is noted. Upper Abdomen: Hepatic steatosis. Musculoskeletal: No chest wall abnormality. No acute or significant osseous findings. Review of the MIP images confirms the above findings. IMPRESSION: No definite evidence of pulmonary embolus. Mild bilateral posterior basilar subsegmental atelectasis.  Hepatic steatosis. Electronically Signed   By: Lupita Raider M.D.   On: 05/30/2023 08:25   DG Chest Port 1 View  Result Date: 05/28/2023 CLINICAL DATA:  Leukocytosis EXAM: PORTABLE CHEST 1 VIEW COMPARISON:  12/24/2013 FINDINGS: The heart size and mediastinal contours are within normal limits. Slightly low lung volumes. No focal airspace consolidation, pleural effusion, or pneumothorax. The visualized skeletal structures are unremarkable. IMPRESSION: No active  disease. Electronically Signed   By: Duanne Guess D.O.   On: 05/28/2023 13:07   CT HEAD WO CONTRAST ( )  Result Date: 05/27/2023 CLINICAL DATA:  Unresponsive EXAM: CT HEAD WITHOUT CONTRAST TECHNIQUE: Contiguous axial images were obtained from the base of the skull through the vertex without intravenous contrast. RADIATION DOSE REDUCTION: This exam was performed according to the departmental dose-optimization program which includes automated exposure control, adjustment of the mA and/or kV according to patient size and/or use of iterative reconstruction technique. COMPARISON:  None Available. FINDINGS: Brain: No acute territorial infarction, hemorrhage or intracranial mass. The ventricles are nonenlarged. Vascular: No hyperdense vessel or unexpected calcification. Skull: Normal. Negative for fracture or focal lesion. Sinuses/Orbits: No acute finding. Mucosal thickening and postsurgical changes at the maxillary sinuses. Other: None IMPRESSION: Negative.  No CT evidence for acute intracranial abnormality Electronically Signed   By: Jasmine Pang M.D.   On: 05/27/2023 21:53   MR Shoulder Left Wo Contrast  Result Date: 05/23/2023 CLINICAL DATA:  Left shoulder pain, limited range of motion EXAM: MRI OF THE LEFT SHOULDER WITHOUT CONTRAST TECHNIQUE: Multiplanar, multisequence MR imaging of the shoulder was performed. No intravenous contrast was administered. COMPARISON:  None Available. FINDINGS: Rotator cuff: Mild tendinosis of the supraspinatus tendon with a tiny partial-thickness articular surface tear along the anterior-most aspect, (image 12-13/series 7). Infraspinatus tendon is intact. Teres minor tendon is intact. Subscapularis tendon is intact. Muscles: No muscle atrophy or edema. No intramuscular fluid collection or hematoma. Biceps Long Head: Intraarticular and extraarticular portions of the biceps tendon are intact. Acromioclavicular Joint: No significant arthropathy of the acromioclavicular joint.  No subacromial/subdeltoid bursal fluid. Glenohumeral Joint: No joint effusion. No chondral defect. Labrum: Grossly intact, but evaluation is limited by lack of intraarticular fluid/contrast. Bones: No fracture or dislocation. No aggressive osseous lesion. Other: No fluid collection or hematoma. IMPRESSION: 1. Mild tendinosis of the supraspinatus tendon with a tiny partial-thickness articular surface tear along the anterior-most aspect. Electronically Signed   By: Elige Ko M.D.   On: 05/23/2023 06:59      Labs: BNP (last 3 results) No results for input(s): "BNP" in the last 8760 hours. Basic Metabolic Panel: Recent Labs  Lab 05/29/23 0407 05/29/23 1458 05/30/23 0312 05/31/23 0706 06/01/23 0431  NA 156* 145 145 138 137  K 4.1 3.6 3.8 4.2 3.3*  CL 128* 117* 117* 110 106  CO2 19* 21* 22 18* 20*  GLUCOSE 229* 461* 314* 302* 247*  BUN 61* 52* 35* 25* 23*  CREATININE 1.49* 1.36* 1.05* 0.88 0.75  CALCIUM 11.0* 10.0 9.6 9.0 8.9  MG 2.8*  --   --   --  1.8   Liver Function Tests: Recent Labs  Lab 05/27/23 2056  AST 17  ALT 14  ALKPHOS 114  BILITOT 2.3*  PROT 9.0*  ALBUMIN 3.4*   No results for input(s): "LIPASE", "AMYLASE" in the last 168 hours. No results for input(s): "AMMONIA" in the last 168 hours. CBC: Recent Labs  Lab 05/28/23 0303 05/29/23 0407 05/30/23 0312 05/31/23 0706 06/01/23 0431  WBC 16.9* 13.7*  12.1* 11.9* 10.1  HGB 13.0 11.9* 10.3* 9.9* 10.8*  HCT 43.2 38.2 33.8* 31.5* 35.2*  MCV 78.5* 76.7* 79.2* 75.7* 77.2*  PLT 387 320 276 248 276   Cardiac Enzymes: No results for input(s): "CKTOTAL", "CKMB", "CKMBINDEX", "TROPONINI" in the last 168 hours. BNP: Invalid input(s): "POCBNP" CBG: Recent Labs  Lab 05/30/23 2110 05/31/23 0832 05/31/23 1140 05/31/23 1619 05/31/23 2048  GLUCAP 239* 258* 298* 272* 171*   D-Dimer Recent Labs    05/29/23 1458  DDIMER 4.74*   Hgb A1c No results for input(s): "HGBA1C" in the last 72 hours. Lipid Profile No  results for input(s): "CHOL", "HDL", "LDLCALC", "TRIG", "CHOLHDL", "LDLDIRECT" in the last 72 hours. Thyroid function studies No results for input(s): "TSH", "T4TOTAL", "T3FREE", "THYROIDAB" in the last 72 hours.  Invalid input(s): "FREET3" Anemia work up No results for input(s): "VITAMINB12", "FOLATE", "FERRITIN", "TIBC", "IRON", "RETICCTPCT" in the last 72 hours. Urinalysis    Component Value Date/Time   COLORURINE AMBER (A) 05/27/2023 2054   APPEARANCEUR CLOUDY (A) 05/27/2023 2054   LABSPEC 1.025 05/27/2023 2054   PHURINE 5.0 05/27/2023 2054   GLUCOSEU >=500 (A) 05/27/2023 2054   HGBUR LARGE (A) 05/27/2023 2054   BILIRUBINUR NEGATIVE 05/27/2023 2054   KETONESUR 20 (A) 05/27/2023 2054   PROTEINUR 100 (A) 05/27/2023 2054   NITRITE NEGATIVE 05/27/2023 2054   LEUKOCYTESUR SMALL (A) 05/27/2023 2054   Sepsis Labs Recent Labs  Lab 05/29/23 0407 05/30/23 0312 05/31/23 0706 06/01/23 0431  WBC 13.7* 12.1* 11.9* 10.1   Microbiology Recent Results (from the past 240 hour(s))  Urine Culture     Status: Abnormal   Collection Time: 05/27/23  8:54 PM   Specimen: Urine, Random  Result Value Ref Range Status   Specimen Description   Final    URINE, RANDOM Performed at [facility], [address] [place], [place] [address]    Special Requests   Final    NONE Performed at [facility], [address] [place], [place] [address]    Culture MULTIPLE SPECIES PRESENT, SUGGEST RECOLLECTION (A)  Final   Report Status 05/29/2023 FINAL  Final  MRSA Next Gen by PCR, Nasal     Status: None   Collection Time: 05/27/23 11:35 PM   Specimen: Nasal Mucosa; Nasal Swab  Result Value Ref Range Status   MRSA by PCR Next Gen NOT DETECTED NOT DETECTED Final    Comment: (NOTE) The GeneXpert MRSA Assay (FDA approved for NASAL specimens only), is one component of a comprehensive MRSA colonization surveillance program. It is not intended to diagnose MRSA infection nor to guide or  monitor treatment for MRSA infections. Test performance is not FDA approved in patients less than 69 years old. Performed at [facility], [address] [place], [place] [address]   Culture, blood (Routine X 2) w Reflex to ID Panel     Status: None (Preliminary result)   Collection Time: 05/29/23 10:00 AM   Specimen: BLOOD  Result Value Ref Range Status   Specimen Description BLOOD LEFT ANTECUBITAL  Final   Special Requests   Final    BOTTLES DRAWN AEROBIC AND ANAEROBIC Blood Culture adequate volume   Culture   Final    NO GROWTH 3 DAYS Performed at [facility], [address] [place], [place] [address]    Report Status PENDING  Incomplete  Culture, blood (Routine X 2) w Reflex to ID Panel     Status: None (Preliminary result)   Collection Time: 05/29/23  11:00 AM   Specimen: BLOOD  Result Value Ref Range Status   Specimen Description BLOOD LEFT ANTECUBITAL  Final   Special Requests   Final    BOTTLES DRAWN AEROBIC AND ANAEROBIC Blood Culture adequate volume   Culture   Final    NO GROWTH 3 DAYS Performed at [facility], [address] [place], [place] [address]    Report Status PENDING  Incomplete  Respiratory (~20 pathogens) panel by PCR     Status: None   Collection Time: 05/30/23 10:55 PM   Specimen: Nasopharyngeal Swab; Respiratory  Result Value Ref Range Status   Adenovirus NOT DETECTED NOT DETECTED Final   Coronavirus 229E NOT DETECTED NOT DETECTED Final    Comment: (NOTE) The Coronavirus on the Respiratory Panel, DOES NOT test for the novel  Coronavirus (2019 nCoV)    Coronavirus HKU1 NOT DETECTED NOT DETECTED Final   Coronavirus NL63 NOT DETECTED NOT DETECTED Final   Coronavirus OC43 NOT DETECTED NOT DETECTED Final   Metapneumovirus NOT DETECTED NOT DETECTED Final   Rhinovirus / Enterovirus NOT DETECTED NOT DETECTED Final   Influenza A NOT DETECTED NOT DETECTED Final   Influenza B NOT DETECTED NOT DETECTED Final   Parainfluenza  Virus 1 NOT DETECTED NOT DETECTED Final   Parainfluenza Virus 2 NOT DETECTED NOT DETECTED Final   Parainfluenza Virus 3 NOT DETECTED NOT DETECTED Final   Parainfluenza Virus 4 NOT DETECTED NOT DETECTED Final   Respiratory Syncytial Virus NOT DETECTED NOT DETECTED Final   Bordetella pertussis NOT DETECTED NOT DETECTED Final   Bordetella Parapertussis NOT DETECTED NOT DETECTED Final   Chlamydophila pneumoniae NOT DETECTED NOT DETECTED Final   Mycoplasma pneumoniae NOT DETECTED NOT DETECTED Final    Comment: Performed at [facility], [address] [place], [place] [address]  Resp panel by RT-PCR (RSV, Flu A&B, Covid) Anterior Nasal Swab     Status: None   Collection Time: 05/30/23 10:55 PM   Specimen: Anterior Nasal Swab  Result Value Ref Range Status   SARS Coronavirus 2 by RT PCR NEGATIVE NEGATIVE Final    Comment: (NOTE) SARS-CoV-2 target nucleic acids are NOT DETECTED.  The SARS-CoV-2 RNA is generally detectable in upper respiratory specimens during the acute phase of infection. The lowest concentration of SARS-CoV-2 viral copies this assay can detect is 138 copies/mL. A negative result does not preclude SARS-Cov-2 infection and should not be used as the sole basis for treatment or other patient management decisions. A negative result may occur with  improper specimen collection/handling, submission of specimen other than nasopharyngeal swab, presence of viral mutation(s) within the areas targeted by this assay, and inadequate number of viral copies(<138 copies/mL). A negative result must be combined with clinical observations, patient history, and epidemiological information. The expected result is Negative.  Fact Sheet for Patients:  BloggerCourse.com  Fact Sheet for Healthcare Providers:  SeriousBroker.it  This test is no t yet approved or cleared by the [place] FDA and  has been authorized for detection and/or  diagnosis of SARS-CoV-2 by FDA under an Emergency Use Authorization (EUA). This EUA will remain  in effect (meaning this test can be used) for the duration of the COVID-19 declaration under Section 564(b)(1) of the Act, 21 U.S.C.section 360bbb-3(b)(1), unless the authorization is terminated  or revoked sooner.       Influenza A by PCR NEGATIVE NEGATIVE Final   Influenza B by PCR NEGATIVE NEGATIVE Final    Comment: (NOTE) The Xpert Xpress SARS-CoV-2/FLU/RSV plus  assay is intended as an aid in the diagnosis of influenza from Nasopharyngeal swab specimens and should not be used as a sole basis for treatment. Nasal washings and aspirates are unacceptable for Xpert Xpress SARS-CoV-2/FLU/RSV testing.  Fact Sheet for Patients: BloggerCourse.com  Fact Sheet for Healthcare Providers: SeriousBroker.it  This test is not yet approved or cleared by the [place] FDA and has been authorized for detection and/or diagnosis of SARS-CoV-2 by FDA under an Emergency Use Authorization (EUA). This EUA will remain in effect (meaning this test can be used) for the duration of the COVID-19 declaration under Section 564(b)(1) of the Act, 21 U.S.C. section 360bbb-3(b)(1), unless the authorization is terminated or revoked.     Resp Syncytial Virus by PCR NEGATIVE NEGATIVE Final    Comment: (NOTE) Fact Sheet for Patients: BloggerCourse.com  Fact Sheet for Healthcare Providers: SeriousBroker.it  This test is not yet approved or cleared by the [place] FDA and has been authorized for detection and/or diagnosis of SARS-CoV-2 by FDA under an Emergency Use Authorization (EUA). This EUA will remain in effect (meaning this test can be used) for the duration of the COVID-19 declaration under Section 564(b)(1) of the Act, 21 U.S.C. section 360bbb-3(b)(1), unless the authorization is terminated  or revoked.  Performed at [facility], [address] [place], [place] [address]      Total time spend on discharging this patient, including the last patient exam, discussing the hospital stay, instructions for ongoing care as it relates to all pertinent caregivers, as well as preparing the medical discharge records, prescriptions, and/or referrals as applicable, is 40 minutes.    Darlin Priestly, MD  [facility] 06/01/2023, 8:46 AM

## 2023-06-01 NOTE — Progress Notes (Signed)
The patient has been discharged. IV has been removed. Education has been provided to the patient using the teach back method. The patient has been wheeled out to her vehicle.

## 2023-06-01 NOTE — Plan of Care (Signed)
  Problem: Education: Goal: Ability to describe self-care measures that may prevent or decrease complications (Diabetes Survival Skills Education) will improve Outcome: Completed/Met Goal: Individualized Educational Video(s) Outcome: Completed/Met   Problem: Coping: Goal: Ability to adjust to condition or change in health will improve Outcome: Completed/Met   Problem: Fluid Volume: Goal: Ability to maintain a balanced intake and output will improve Outcome: Completed/Met   Problem: Health Behavior/Discharge Planning: Goal: Ability to identify and utilize available resources and services will improve Outcome: Completed/Met Goal: Ability to manage health-related needs will improve Outcome: Completed/Met   Problem: Metabolic: Goal: Ability to maintain appropriate glucose levels will improve Outcome: Completed/Met   Problem: Nutritional: Goal: Maintenance of adequate nutrition will improve Outcome: Completed/Met Goal: Progress toward achieving an optimal weight will improve Outcome: Completed/Met   Problem: Skin Integrity: Goal: Risk for impaired skin integrity will decrease Outcome: Completed/Met   Problem: Tissue Perfusion: Goal: Adequacy of tissue perfusion will improve Outcome: Completed/Met   Problem: Education: Goal: Ability to describe self-care measures that may prevent or decrease complications (Diabetes Survival Skills Education) will improve Outcome: Completed/Met Goal: Individualized Educational Video(s) Outcome: Completed/Met   Problem: Cardiac: Goal: Ability to maintain an adequate cardiac output will improve Outcome: Completed/Met   Problem: Health Behavior/Discharge Planning: Goal: Ability to identify and utilize available resources and services will improve Outcome: Completed/Met Goal: Ability to manage health-related needs will improve Outcome: Completed/Met   Problem: Fluid Volume: Goal: Ability to achieve a balanced intake and output will  improve Outcome: Completed/Met   Problem: Metabolic: Goal: Ability to maintain appropriate glucose levels will improve Outcome: Completed/Met   Problem: Respiratory: Goal: Will regain and/or maintain adequate ventilation Outcome: Completed/Met   Problem: Urinary Elimination: Goal: Ability to achieve and maintain adequate renal perfusion and functioning will improve Outcome: Completed/Met   Problem: Education: Goal: Knowledge of General Education information will improve Description: Including pain rating scale, medication(s)/side effects and non-pharmacologic comfort measures Outcome: Completed/Met   Problem: Health Behavior/Discharge Planning: Goal: Ability to manage health-related needs will improve Outcome: Completed/Met   Problem: Clinical Measurements: Goal: Ability to maintain clinical measurements within normal limits will improve Outcome: Completed/Met Goal: Will remain free from infection Outcome: Completed/Met Goal: Diagnostic test results will improve Outcome: Completed/Met Goal: Respiratory complications will improve Outcome: Completed/Met Goal: Cardiovascular complication will be avoided Outcome: Completed/Met

## 2023-06-02 LAB — CULTURE, BLOOD (ROUTINE X 2)
Culture: NO GROWTH
Special Requests: ADEQUATE
Special Requests: ADEQUATE

## 2023-06-03 LAB — CULTURE, BLOOD (ROUTINE X 2): Culture: NO GROWTH

## 2023-06-07 ENCOUNTER — Emergency Department (HOSPITAL_COMMUNITY): Payer: 59

## 2023-06-07 ENCOUNTER — Inpatient Hospital Stay (HOSPITAL_COMMUNITY)
Admission: EM | Admit: 2023-06-07 | Discharge: 2023-06-26 | DRG: 637 | Disposition: A | Discharge status: Home / Self Care | Payer: 59 | Source: Ambulatory Visit | Attending: Family Medicine | Admitting: Family Medicine

## 2023-06-07 ENCOUNTER — Ambulatory Visit (INDEPENDENT_AMBULATORY_CARE_PROVIDER_SITE_OTHER): Payer: 59 | Admitting: Medical

## 2023-06-07 VITALS — BP 100/64 | HR 100 | Temp 97.5°F

## 2023-06-07 DIAGNOSIS — Z9884 Bariatric surgery status: Secondary | ICD-10-CM

## 2023-06-07 DIAGNOSIS — D259 Leiomyoma of uterus, unspecified: Secondary | ICD-10-CM | POA: Diagnosis present

## 2023-06-07 DIAGNOSIS — K219 Gastro-esophageal reflux disease without esophagitis: Secondary | ICD-10-CM | POA: Diagnosis present

## 2023-06-07 DIAGNOSIS — B961 Klebsiella pneumoniae [K. pneumoniae] as the cause of diseases classified elsewhere: Secondary | ICD-10-CM | POA: Diagnosis not present

## 2023-06-07 DIAGNOSIS — R5383 Other fatigue: Secondary | ICD-10-CM | POA: Diagnosis not present

## 2023-06-07 DIAGNOSIS — Z823 Family history of stroke: Secondary | ICD-10-CM

## 2023-06-07 DIAGNOSIS — E1110 Type 2 diabetes mellitus with ketoacidosis without coma: Secondary | ICD-10-CM | POA: Diagnosis not present

## 2023-06-07 DIAGNOSIS — R600 Localized edema: Secondary | ICD-10-CM | POA: Diagnosis present

## 2023-06-07 DIAGNOSIS — R7401 Elevation of levels of liver transaminase levels: Secondary | ICD-10-CM | POA: Diagnosis present

## 2023-06-07 DIAGNOSIS — K651 Peritoneal abscess: Secondary | ICD-10-CM | POA: Diagnosis not present

## 2023-06-07 DIAGNOSIS — R739 Hyperglycemia, unspecified: Secondary | ICD-10-CM

## 2023-06-07 DIAGNOSIS — E874 Mixed disorder of acid-base balance: Secondary | ICD-10-CM | POA: Diagnosis present

## 2023-06-07 DIAGNOSIS — K8021 Calculus of gallbladder without cholecystitis with obstruction: Secondary | ICD-10-CM | POA: Diagnosis present

## 2023-06-07 DIAGNOSIS — J4520 Mild intermittent asthma, uncomplicated: Secondary | ICD-10-CM | POA: Diagnosis present

## 2023-06-07 DIAGNOSIS — Z6837 Body mass index (BMI) 37.0-37.9, adult: Secondary | ICD-10-CM

## 2023-06-07 DIAGNOSIS — N179 Acute kidney failure, unspecified: Secondary | ICD-10-CM | POA: Diagnosis present

## 2023-06-07 DIAGNOSIS — Z9289 Personal history of other medical treatment: Secondary | ICD-10-CM | POA: Diagnosis not present

## 2023-06-07 DIAGNOSIS — Z87440 Personal history of urinary (tract) infections: Secondary | ICD-10-CM

## 2023-06-07 DIAGNOSIS — E873 Alkalosis: Secondary | ICD-10-CM | POA: Diagnosis present

## 2023-06-07 DIAGNOSIS — G9341 Metabolic encephalopathy: Secondary | ICD-10-CM | POA: Diagnosis present

## 2023-06-07 DIAGNOSIS — K5900 Constipation, unspecified: Secondary | ICD-10-CM | POA: Diagnosis not present

## 2023-06-07 DIAGNOSIS — E860 Dehydration: Secondary | ICD-10-CM | POA: Diagnosis present

## 2023-06-07 DIAGNOSIS — Z794 Long term (current) use of insulin: Secondary | ICD-10-CM

## 2023-06-07 DIAGNOSIS — R400 Somnolence: Secondary | ICD-10-CM | POA: Diagnosis not present

## 2023-06-07 DIAGNOSIS — K3189 Other diseases of stomach and duodenum: Secondary | ICD-10-CM | POA: Diagnosis present

## 2023-06-07 DIAGNOSIS — K255 Chronic or unspecified gastric ulcer with perforation: Secondary | ICD-10-CM | POA: Diagnosis not present

## 2023-06-07 DIAGNOSIS — Z800 Family history of malignant neoplasm of digestive organs: Secondary | ICD-10-CM

## 2023-06-07 DIAGNOSIS — K760 Fatty (change of) liver, not elsewhere classified: Secondary | ICD-10-CM | POA: Diagnosis present

## 2023-06-07 DIAGNOSIS — R4182 Altered mental status, unspecified: Secondary | ICD-10-CM | POA: Diagnosis not present

## 2023-06-07 DIAGNOSIS — J45909 Unspecified asthma, uncomplicated: Secondary | ICD-10-CM | POA: Diagnosis present

## 2023-06-07 DIAGNOSIS — Z833 Family history of diabetes mellitus: Secondary | ICD-10-CM

## 2023-06-07 DIAGNOSIS — E785 Hyperlipidemia, unspecified: Secondary | ICD-10-CM | POA: Diagnosis present

## 2023-06-07 DIAGNOSIS — K316 Fistula of stomach and duodenum: Secondary | ICD-10-CM | POA: Diagnosis present

## 2023-06-07 DIAGNOSIS — E8809 Other disorders of plasma-protein metabolism, not elsewhere classified: Secondary | ICD-10-CM | POA: Diagnosis present

## 2023-06-07 DIAGNOSIS — K449 Diaphragmatic hernia without obstruction or gangrene: Secondary | ICD-10-CM | POA: Diagnosis present

## 2023-06-07 DIAGNOSIS — R6889 Other general symptoms and signs: Secondary | ICD-10-CM

## 2023-06-07 DIAGNOSIS — Z79899 Other long term (current) drug therapy: Secondary | ICD-10-CM

## 2023-06-07 DIAGNOSIS — E669 Obesity, unspecified: Secondary | ICD-10-CM | POA: Diagnosis present

## 2023-06-07 DIAGNOSIS — G9340 Encephalopathy, unspecified: Secondary | ICD-10-CM

## 2023-06-07 DIAGNOSIS — Y832 Surgical operation with anastomosis, bypass or graft as the cause of abnormal reaction of the patient, or of later complication, without mention of misadventure at the time of the procedure: Secondary | ICD-10-CM | POA: Diagnosis not present

## 2023-06-07 DIAGNOSIS — K2289 Other specified disease of esophagus: Secondary | ICD-10-CM | POA: Diagnosis present

## 2023-06-07 DIAGNOSIS — K9589 Other complications of other bariatric procedure: Secondary | ICD-10-CM | POA: Diagnosis not present

## 2023-06-07 DIAGNOSIS — E8770 Fluid overload, unspecified: Secondary | ICD-10-CM | POA: Diagnosis not present

## 2023-06-07 DIAGNOSIS — Z886 Allergy status to analgesic agent status: Secondary | ICD-10-CM

## 2023-06-07 DIAGNOSIS — R112 Nausea with vomiting, unspecified: Secondary | ICD-10-CM | POA: Diagnosis present

## 2023-06-07 DIAGNOSIS — R791 Abnormal coagulation profile: Secondary | ICD-10-CM | POA: Diagnosis present

## 2023-06-07 HISTORY — DX: Altered mental status, unspecified: R41.82

## 2023-06-07 LAB — URINALYSIS, ROUTINE W REFLEX MICROSCOPIC
Bilirubin Urine: NEGATIVE
Glucose, UA: 150 mg/dL — AB
Hgb urine dipstick: NEGATIVE
Ketones, ur: 20 mg/dL — AB
Leukocytes,Ua: NEGATIVE
Nitrite: NEGATIVE
Protein, ur: 30 mg/dL — AB
Specific Gravity, Urine: 1.017 (ref 1.005–1.030)
pH: 6 (ref 5.0–8.0)

## 2023-06-07 LAB — CBC WITH DIFFERENTIAL/PLATELET
Abs Immature Granulocytes: 0 10*3/uL (ref 0.00–0.07)
Basophils Absolute: 0 10*3/uL (ref 0.0–0.1)
Basophils Relative: 0 %
Eosinophils Absolute: 0 10*3/uL (ref 0.0–0.5)
Eosinophils Relative: 0 %
HCT: 36.1 % (ref 36.0–46.0)
Hemoglobin: 10.8 g/dL — ABNORMAL LOW (ref 12.0–15.0)
Lymphocytes Relative: 13 %
Lymphs Abs: 1.9 10*3/uL (ref 0.7–4.0)
MCH: 23.2 pg — ABNORMAL LOW (ref 26.0–34.0)
MCHC: 29.9 g/dL — ABNORMAL LOW (ref 30.0–36.0)
MCV: 77.5 fL — ABNORMAL LOW (ref 80.0–100.0)
Monocytes Absolute: 0.9 10*3/uL (ref 0.1–1.0)
Monocytes Relative: 6 %
Neutro Abs: 11.7 10*3/uL — ABNORMAL HIGH (ref 1.7–7.7)
Neutrophils Relative %: 81 %
Platelets: 397 10*3/uL (ref 150–400)
RBC: 4.66 MIL/uL (ref 3.87–5.11)
RDW: 16.6 % — ABNORMAL HIGH (ref 11.5–15.5)
WBC: 14.4 10*3/uL — ABNORMAL HIGH (ref 4.0–10.5)
nRBC: 0 /100 WBC
nRBC: 1.2 % — ABNORMAL HIGH (ref 0.0–0.2)

## 2023-06-07 LAB — I-STAT CG4 LACTIC ACID, ED
Lactic Acid, Venous: 2.3 mmol/L (ref 0.5–1.9)
Lactic Acid, Venous: 2.3 mmol/L (ref 0.5–1.9)

## 2023-06-07 LAB — COMPREHENSIVE METABOLIC PANEL
ALT: 917 U/L — ABNORMAL HIGH (ref 0–44)
AST: 168 U/L — ABNORMAL HIGH (ref 15–41)
Albumin: 2.1 g/dL — ABNORMAL LOW (ref 3.5–5.0)
Alkaline Phosphatase: 419 U/L — ABNORMAL HIGH (ref 38–126)
Anion gap: 17 — ABNORMAL HIGH (ref 5–15)
BUN: 57 mg/dL — ABNORMAL HIGH (ref 6–20)
CO2: 18 mmol/L — ABNORMAL LOW (ref 22–32)
Calcium: 8.7 mg/dL — ABNORMAL LOW (ref 8.9–10.3)
Chloride: 99 mmol/L (ref 98–111)
Creatinine, Ser: 1.33 mg/dL — ABNORMAL HIGH (ref 0.44–1.00)
GFR, Estimated: 49 mL/min — ABNORMAL LOW (ref >60)
Glucose, Bld: 443 mg/dL — ABNORMAL HIGH (ref 70–99)
Potassium: 4.1 mmol/L (ref 3.5–5.1)
Sodium: 134 mmol/L — ABNORMAL LOW (ref 135–145)
Total Bilirubin: 1.2 mg/dL (ref 0.3–1.2)
Total Protein: 6.9 g/dL (ref 6.5–8.1)

## 2023-06-07 LAB — RAPID URINE DRUG SCREEN, HOSP PERFORMED
Amphetamines: NOT DETECTED
Barbiturates: NOT DETECTED
Benzodiazepines: NOT DETECTED
Cocaine: NOT DETECTED
Opiates: NOT DETECTED
Tetrahydrocannabinol: NOT DETECTED

## 2023-06-07 LAB — LIPID PANEL
Cholesterol: 81 mg/dL (ref 0–200)
HDL: <10 mg/dL — ABNORMAL LOW (ref >40)
Triglycerides: 191 mg/dL — ABNORMAL HIGH (ref <150)
VLDL: 38 mg/dL (ref 0–40)

## 2023-06-07 LAB — I-STAT VENOUS BLOOD GAS, ED
Acid-base deficit: 3 mmol/L — ABNORMAL HIGH (ref 0.0–2.0)
Bicarbonate: 19.9 mmol/L — ABNORMAL LOW (ref 20.0–28.0)
Calcium, Ion: 1.09 mmol/L — ABNORMAL LOW (ref 1.15–1.40)
HCT: 37 % (ref 36.0–46.0)
Hemoglobin: 12.6 g/dL (ref 12.0–15.0)
O2 Saturation: 51 %
Potassium: 4.1 mmol/L (ref 3.5–5.1)
Sodium: 134 mmol/L — ABNORMAL LOW (ref 135–145)
TCO2: 21 mmol/L — ABNORMAL LOW (ref 22–32)
pCO2, Ven: 29.4 mmHg — ABNORMAL LOW (ref 44–60)
pH, Ven: 7.437 — ABNORMAL HIGH (ref 7.25–7.43)
pO2, Ven: 26 mmHg — CL (ref 32–45)

## 2023-06-07 LAB — HEPATITIS PANEL, ACUTE
HCV Ab: NONREACTIVE
Hep A IgM: NONREACTIVE
Hep B C IgM: NONREACTIVE
Hepatitis B Surface Ag: NONREACTIVE

## 2023-06-07 LAB — PREGNANCY, URINE: Preg Test, Ur: NEGATIVE

## 2023-06-07 LAB — PROTIME-INR
INR: 1.4 — ABNORMAL HIGH (ref 0.8–1.2)
Prothrombin Time: 17.4 seconds — ABNORMAL HIGH (ref 11.4–15.2)

## 2023-06-07 LAB — BRAIN NATRIURETIC PEPTIDE: B Natriuretic Peptide: 126 pg/mL — ABNORMAL HIGH (ref 0.0–100.0)

## 2023-06-07 LAB — ACETAMINOPHEN LEVEL: Acetaminophen (Tylenol), Serum: <10 ug/mL — ABNORMAL LOW (ref 10–30)

## 2023-06-07 LAB — CBG MONITORING, ED
Glucose-Capillary: 432 mg/dL — ABNORMAL HIGH (ref 70–99)
Glucose-Capillary: 443 mg/dL — ABNORMAL HIGH (ref 70–99)

## 2023-06-07 LAB — ETHANOL: Alcohol, Ethyl (B): <10 mg/dL (ref <10)

## 2023-06-07 LAB — AMMONIA: Ammonia: 39 umol/L — ABNORMAL HIGH (ref 9–35)

## 2023-06-07 LAB — LIPASE, BLOOD: Lipase: 141 U/L — ABNORMAL HIGH (ref 11–51)

## 2023-06-07 LAB — GLUCOSE, CAPILLARY
Glucose-Capillary: 291 mg/dL — ABNORMAL HIGH (ref 70–99)
Glucose-Capillary: 357 mg/dL — ABNORMAL HIGH (ref 70–99)
Glucose-Capillary: 373 mg/dL — ABNORMAL HIGH (ref 70–99)

## 2023-06-07 LAB — GLUCOSE, POCT (MANUAL RESULT ENTRY): POC Glucose: 454 mg/dl — AB (ref 70–99)

## 2023-06-07 MED ORDER — ONDANSETRON HCL 4 MG PO TABS
4.0000 mg | ORAL_TABLET | Freq: Four times a day (QID) | ORAL | Status: DC | PRN
  Administered 2023-06-09: 4 mg via ORAL
  Filled 2023-06-07: qty 1

## 2023-06-07 MED ORDER — FLUTICASONE PROPIONATE (INHAL) 50 MCG/ACT IN AEPB: 1.0000 | INHALATION_SPRAY | Freq: Two times a day (BID) | RESPIRATORY_TRACT | Status: DC

## 2023-06-07 MED ORDER — ACETAMINOPHEN 325 MG PO TABS
650.0000 mg | ORAL_TABLET | Freq: Four times a day (QID) | ORAL | Status: DC | PRN
  Administered 2023-06-07 – 2023-06-08 (×2): 650 mg via ORAL
  Filled 2023-06-07 (×2): qty 2

## 2023-06-07 MED ORDER — SODIUM CHLORIDE 0.9 % IV SOLN: INTRAVENOUS | Status: AC

## 2023-06-07 MED ORDER — SODIUM CHLORIDE 0.9 % IV BOLUS
1000.0000 mL | Freq: Once | INTRAVENOUS | Status: AC
  Administered 2023-06-07: 1000 mL via INTRAVENOUS

## 2023-06-07 MED ORDER — ONDANSETRON HCL 4 MG/2ML IJ SOLN
4.0000 mg | Freq: Four times a day (QID) | INTRAMUSCULAR | Status: DC | PRN
  Administered 2023-06-12 – 2023-06-21 (×5): 4 mg via INTRAVENOUS
  Filled 2023-06-07 (×5): qty 2

## 2023-06-07 MED ORDER — PANTOPRAZOLE SODIUM 40 MG PO TBEC
40.0000 mg | DELAYED_RELEASE_TABLET | Freq: Every day | ORAL | Status: DC
  Administered 2023-06-07 – 2023-06-11 (×5): 40 mg via ORAL
  Filled 2023-06-07 (×10): qty 1

## 2023-06-07 MED ORDER — BUDESONIDE 0.25 MG/2ML IN SUSP: 0.2500 mg | Freq: Two times a day (BID) | RESPIRATORY_TRACT | Status: DC

## 2023-06-07 MED ORDER — ALBUTEROL SULFATE (2.5 MG/3ML) 0.083% IN NEBU
3.0000 mL | INHALATION_SOLUTION | Freq: Four times a day (QID) | RESPIRATORY_TRACT | Status: DC | PRN
  Administered 2023-06-07 – 2023-06-08 (×2): 3 mL via RESPIRATORY_TRACT
  Filled 2023-06-07 (×2): qty 3

## 2023-06-07 MED ORDER — MONTELUKAST SODIUM 10 MG PO TABS
10.0000 mg | ORAL_TABLET | Freq: Every day | ORAL | Status: DC
  Administered 2023-06-07 – 2023-06-14 (×7): 10 mg via ORAL
  Filled 2023-06-07 (×7): qty 1

## 2023-06-07 MED ORDER — INSULIN GLARGINE-YFGN 100 UNIT/ML ~~LOC~~ SOLN
22.0000 [IU] | Freq: Every day | SUBCUTANEOUS | Status: DC
  Administered 2023-06-07 – 2023-06-08 (×2): 22 [IU] via SUBCUTANEOUS
  Filled 2023-06-07 (×2): qty 0.22

## 2023-06-07 MED ORDER — INSULIN ASPART 100 UNIT/ML IJ SOLN
0.0000 [IU] | Freq: Every day | INTRAMUSCULAR | Status: DC
  Administered 2023-06-07: 5 [IU] via SUBCUTANEOUS
  Administered 2023-06-08: 4 [IU] via SUBCUTANEOUS
  Administered 2023-06-12 – 2023-06-14 (×2): 2 [IU] via SUBCUTANEOUS

## 2023-06-07 MED ORDER — IOHEXOL 9 MG/ML PO SOLN
500.0000 mL | ORAL | Status: AC
  Administered 2023-06-07: 500 mL via ORAL

## 2023-06-07 MED ORDER — BECLOMETHASONE DIPROP HFA 40 MCG/ACT IN AERB: 1.0000 | INHALATION_SPRAY | Freq: Two times a day (BID) | RESPIRATORY_TRACT | Status: DC

## 2023-06-07 MED ORDER — INSULIN LISPRO (1 UNIT DIAL) 100 UNIT/ML (KWIKPEN): 6.0000 [IU] | PEN_INJECTOR | Freq: Three times a day (TID) | SUBCUTANEOUS | Status: DC

## 2023-06-07 MED ORDER — INSULIN ASPART 100 UNIT/ML IJ SOLN: 0.0000 [IU] | Freq: Three times a day (TID) | INTRAMUSCULAR | Status: DC

## 2023-06-07 MED ORDER — INSULIN ASPART 100 UNIT/ML IJ SOLN
0.0000 [IU] | Freq: Three times a day (TID) | INTRAMUSCULAR | Status: DC
  Administered 2023-06-08: 8 [IU] via SUBCUTANEOUS
  Administered 2023-06-08: 5 [IU] via SUBCUTANEOUS
  Administered 2023-06-08: 11 [IU] via SUBCUTANEOUS
  Administered 2023-06-09: 10 [IU] via SUBCUTANEOUS
  Administered 2023-06-09: 5 [IU] via SUBCUTANEOUS
  Administered 2023-06-09: 11 [IU] via SUBCUTANEOUS
  Administered 2023-06-10: 5 [IU] via SUBCUTANEOUS
  Administered 2023-06-11: 3 [IU] via SUBCUTANEOUS
  Administered 2023-06-11: 2 [IU] via SUBCUTANEOUS
  Administered 2023-06-12: 5 [IU] via SUBCUTANEOUS
  Administered 2023-06-12 (×2): 3 [IU] via SUBCUTANEOUS
  Administered 2023-06-13: 8 [IU] via SUBCUTANEOUS
  Administered 2023-06-13 (×2): 5 [IU] via SUBCUTANEOUS
  Administered 2023-06-14 (×2): 3 [IU] via SUBCUTANEOUS
  Administered 2023-06-14 – 2023-06-15 (×2): 5 [IU] via SUBCUTANEOUS
  Administered 2023-06-15: 3 [IU] via SUBCUTANEOUS

## 2023-06-07 MED ORDER — LORATADINE 10 MG PO TABS
10.0000 mg | ORAL_TABLET | Freq: Every day | ORAL | Status: DC
  Administered 2023-06-07 – 2023-06-15 (×8): 10 mg via ORAL
  Filled 2023-06-07 (×10): qty 1

## 2023-06-07 MED ORDER — BUDESONIDE 0.25 MG/2ML IN SUSP
0.2500 mg | Freq: Two times a day (BID) | RESPIRATORY_TRACT | Status: DC
  Administered 2023-06-07 – 2023-06-17 (×19): 0.25 mg via RESPIRATORY_TRACT
  Filled 2023-06-07 (×19): qty 2

## 2023-06-07 MED ORDER — INSULIN ASPART 100 UNIT/ML IJ SOLN
6.0000 [IU] | Freq: Three times a day (TID) | INTRAMUSCULAR | Status: DC
  Administered 2023-06-08 (×3): 6 [IU] via SUBCUTANEOUS

## 2023-06-07 MED ORDER — ACETAMINOPHEN 650 MG RE SUPP: 650.0000 mg | Freq: Four times a day (QID) | RECTAL | Status: DC | PRN

## 2023-06-07 MED ORDER — ENOXAPARIN SODIUM 40 MG/0.4ML IJ SOSY
40.0000 mg | PREFILLED_SYRINGE | INTRAMUSCULAR | Status: DC
  Administered 2023-06-07 – 2023-06-25 (×19): 40 mg via SUBCUTANEOUS
  Filled 2023-06-07 (×20): qty 0.4

## 2023-06-07 MED ORDER — INSULIN ASPART 100 UNIT/ML IJ SOLN
8.0000 [IU] | Freq: Once | INTRAMUSCULAR | Status: AC
  Administered 2023-06-07: 8 [IU] via INTRAVENOUS

## 2023-06-07 NOTE — ED Triage Notes (Signed)
Pt arrives via [facility] from a doctor's office following up on recent admission for new DKA at [facility]. Pt has been increasingly altered and weak since yesterday. On arrival alert to verbal stimuli and oriented to person, place, and time. Pt CBG with EMS was in 500's. EtCO2 25. HR 110's.

## 2023-06-07 NOTE — ED Notes (Signed)
ED TO INPATIENT HANDOFF REPORT  ED Nurse Name and Phone #: Grover Canavan [phone]  S Name/Age/Gender Frances Rodgers 52 y.o. female Room/Bed: 044C/044C  Code Status   Code Status: Full Code  Home/SNF/Other Home Patient oriented to: self, place, and time Is this baseline? No   Triage Complete: Triage complete  Chief Complaint AMS (altered mental status) [R41.82]  Triage Note Pt arrives via [facility] from a doctor's office following up on recent admission for new DKA at [facility]. Pt has been increasingly altered and weak since yesterday. On arrival alert to verbal stimuli and oriented to person, place, and time. Pt CBG with EMS was in 500's. EtCO2 25. HR 110's.    Allergies Allergies  Allergen Reactions   Aspirin Anaphylaxis and Shortness Of Breath   Ketorolac Anaphylaxis   Ketorolac Tromethamine Shortness Of Breath    Level of Care/Admitting Diagnosis ED Disposition     ED Disposition  Admit   Condition  --   Comment  Hospital Area: [facility] [[id number]]  Level of Care: Telemetry Medical [104]  May place patient in observation at [facility] or [facility] if equivalent level of care is available:: No  Covid Evaluation: Confirmed COVID Negative  Diagnosis: AMS (altered mental status) [4696295]  Admitting Physician: Emeline General [[id number]]  Attending Physician: Emeline General [[id number]]          B Medical/Surgery History Past Medical History:  Diagnosis Date   Allergy    Anemia    Asthma    Blood transfusion without reported diagnosis 2023   GERD (gastroesophageal reflux disease)    Seasonal allergies    Past Surgical History:  Procedure Laterality Date   GASTRIC BYPASS  2015   NASAL POLYP SURGERY  2010   WISDOM TOOTH EXTRACTION       A IV Location/Drains/Wounds Patient Lines/Drains/Airways Status     Active Line/Drains/Airways     Name Placement date Placement time Site Days   Peripheral IV 06/07/23 22 G 2.5" Left Antecubital 06/07/23  1432   Antecubital  less than 1            Intake/Output Last 24 hours No intake or output data in the 24 hours ending 06/07/23 1740  Labs/Imaging Results for orders placed or performed during the hospital encounter of 06/07/23 (from the past 48 hour(s))  CBG monitoring, ED     Status: Abnormal   Collection Time: 06/07/23 11:31 AM  Result Value Ref Range   Glucose-Capillary 432 (H) 70 - 99 mg/dL    Comment: Glucose reference range applies only to samples taken after fasting for at least 8 hours.  Ammonia     Status: Abnormal   Collection Time: 06/07/23 11:33 AM  Result Value Ref Range   Ammonia 39 (H) 9 - 35 umol/L    Comment: Performed at [facility], [address] [place], [place] [address]  Comprehensive metabolic panel     Status: Abnormal   Collection Time: 06/07/23 11:52 AM  Result Value Ref Range   Sodium 134 (L) 135 - 145 mmol/L   Potassium 4.1 3.5 - 5.1 mmol/L   Chloride 99 98 - 111 mmol/L   CO2 18 (L) 22 - 32 mmol/L   Glucose, Bld 443 (H) 70 - 99 mg/dL    Comment: Glucose reference range applies only to samples taken after fasting for at least 8 hours.   BUN 57 (H) 6 - 20 mg/dL   Creatinine, Ser 4.03 (H) 0.44 -  1.00 mg/dL   Calcium 8.7 (L) 8.9 - 10.3 mg/dL   Total Protein 6.9 6.5 - 8.1 g/dL   Albumin 2.1 (L) 3.5 - 5.0 g/dL   AST 657 (H) 15 - 41 U/L   ALT 917 (H) 0 - 44 U/L   Alkaline Phosphatase 419 (H) 38 - 126 U/L   Total Bilirubin 1.2 0.3 - 1.2 mg/dL   GFR, Estimated 49 (L) >60 mL/min    Comment: (NOTE) Calculated using the CKD-EPI Creatinine Equation (2021)    Anion gap 17 (H) 5 - 15    Comment: Performed at [facility], [address] [place], [place] [address]  CBC with Differential     Status: Abnormal   Collection Time: 06/07/23 11:52 AM  Result Value Ref Range   WBC 14.4 (H) 4.0 - 10.5 K/uL   RBC 4.66 3.87 - 5.11 MIL/uL   Hemoglobin 10.8 (L) 12.0 - 15.0 g/dL   HCT 29.5 28.4 - 13.2 %   MCV 77.5 (L) 80.0 - 100.0 fL   MCH 23.2 (L) 26.0  - 34.0 pg   MCHC 29.9 (L) 30.0 - 36.0 g/dL   RDW 44.0 (H) 10.2 - 72.5 %   Platelets 397 150 - 400 K/uL   nRBC 1.2 (H) 0.0 - 0.2 %   Neutrophils Relative % 81 %   Neutro Abs 11.7 (H) 1.7 - 7.7 K/uL   Lymphocytes Relative 13 %   Lymphs Abs 1.9 0.7 - 4.0 K/uL   Monocytes Relative 6 %   Monocytes Absolute 0.9 0.1 - 1.0 K/uL   Eosinophils Relative 0 %   Eosinophils Absolute 0.0 0.0 - 0.5 K/uL   Basophils Relative 0 %   Basophils Absolute 0.0 0.0 - 0.1 K/uL   WBC Morphology See Note     Comment: Moderate Left Shift. >5% Metas and Myelos   nRBC 0 0 /100 WBC   Abs Immature Granulocytes 0.00 0.00 - 0.07 K/uL   Tear Drop Cells PRESENT     Comment: Performed at [facility], [address], [place], [place] [address]  Ethanol     Status: None   Collection Time: 06/07/23 11:52 AM  Result Value Ref Range   Alcohol, Ethyl (B) <10 <10 mg/dL    Comment: (NOTE) Lowest detectable limit for serum alcohol is 10 mg/dL.  For medical purposes only. Performed at [facility], [address], [place], [place] [address]   I-Stat CG4 Lactic Acid     Status: Abnormal   Collection Time: 06/07/23 11:58 AM  Result Value Ref Range   Lactic Acid, Venous 2.3 (HH) 0.5 - 1.9 mmol/L   Comment NOTIFIED PHYSICIAN   I-Stat venous blood gas, ([facility], [facility], [facility])     Status: Abnormal   Collection Time: 06/07/23 11:58 AM  Result Value Ref Range   pH, Ven 7.437 (H) 7.25 - 7.43   pCO2, Ven 29.4 (L) 44 - 60 mmHg   pO2, Ven 26 (LL) 32 - 45 mmHg   Bicarbonate 19.9 (L) 20.0 - 28.0 mmol/L   TCO2 21 (L) 22 - 32 mmol/L   O2 Saturation 51 %   Acid-base deficit 3.0 (H) 0.0 - 2.0 mmol/L   Sodium 134 (L) 135 - 145 mmol/L   Potassium 4.1 3.5 - 5.1 mmol/L   Calcium, Ion 1.09 (L) 1.15 - 1.40 mmol/L   HCT 37.0 36.0 - 46.0 %   Hemoglobin 12.6 12.0 - 15.0 g/dL   Sample type VENOUS  Comment NOTIFIED PHYSICIAN   Urinalysis, Routine w reflex microscopic -Urine, Clean Catch     Status: Abnormal   Collection Time:  06/07/23 12:27 PM  Result Value Ref Range   Color, Urine YELLOW YELLOW   APPearance HAZY (A) CLEAR   Specific Gravity, Urine 1.017 1.005 - 1.030   pH 6.0 5.0 - 8.0   Glucose, UA 150 (A) NEGATIVE mg/dL   Hgb urine dipstick NEGATIVE NEGATIVE   Bilirubin Urine NEGATIVE NEGATIVE   Ketones, ur 20 (A) NEGATIVE mg/dL   Protein, ur 30 (A) NEGATIVE mg/dL   Nitrite NEGATIVE NEGATIVE   Leukocytes,Ua NEGATIVE NEGATIVE   RBC / HPF 0-5 0 - 5 RBC/hpf   WBC, UA 6-10 0 - 5 WBC/hpf   Bacteria, UA RARE (A) NONE SEEN   Squamous Epithelial / HPF 0-5 0 - 5 /HPF   Mucus PRESENT    Granular Casts, UA PRESENT     Comment: Performed at [facility], [address], [place], [place] [address]  Rapid urine drug screen (hospital performed)     Status: None   Collection Time: 06/07/23 12:28 PM  Result Value Ref Range   Opiates NONE DETECTED NONE DETECTED   Cocaine NONE DETECTED NONE DETECTED   Benzodiazepines NONE DETECTED NONE DETECTED   Amphetamines NONE DETECTED NONE DETECTED   Tetrahydrocannabinol NONE DETECTED NONE DETECTED   Barbiturates NONE DETECTED NONE DETECTED    Comment: (NOTE) DRUG SCREEN FOR MEDICAL PURPOSES ONLY.  IF CONFIRMATION IS NEEDED FOR ANY PURPOSE, NOTIFY LAB WITHIN 5 DAYS.  LOWEST DETECTABLE LIMITS FOR URINE DRUG SCREEN Drug Class                     Cutoff (ng/mL) Amphetamine and metabolites    1000 Barbiturate and metabolites    200 Benzodiazepine                 200 Opiates and metabolites        300 Cocaine and metabolites        300 THC                            50 Performed at [facility], [address], [place], [place] [address]   Acetaminophen level     Status: Abnormal   Collection Time: 06/07/23 12:56 PM  Result Value Ref Range   Acetaminophen (Tylenol), Serum <10 (L) 10 - 30 ug/mL    Comment: (NOTE) Therapeutic concentrations vary significantly. A range of 10-30 ug/mL  may be an effective concentration for many patients. However, some  are  best treated at concentrations outside of this range. Acetaminophen concentrations >150 ug/mL at 4 hours after ingestion  and >50 ug/mL at 12 hours after ingestion are often associated with  toxic reactions.  Performed at [facility], [address] [place], [place] [address]   POC CBG, ED     Status: Abnormal   Collection Time: 06/07/23  1:59 PM  Result Value Ref Range   Glucose-Capillary 443 (H) 70 - 99 mg/dL    Comment: Glucose reference range applies only to samples taken after fasting for at least 8 hours.  I-Stat CG4 Lactic Acid     Status: Abnormal   Collection Time: 06/07/23  2:45 PM  Result Value Ref Range   Lactic Acid, Venous 2.3 (HH) 0.5 - 1.9 mmol/L   Comment NOTIFIED PHYSICIAN    US Abdomen Limited RUQ (  LIVER/GB)  Result Date: 06/07/2023 CLINICAL DATA:  [id number] AMS (altered mental status) [id number] Transaminitis [id number] EXAM: ULTRASOUND ABDOMEN LIMITED RIGHT UPPER QUADRANT COMPARISON:  None Available. FINDINGS: Gallbladder: Small volume layering gallstones/sludge noted. No abnormal wall thickening or pericholecystic fluid. Sonographic Murphy's sign could not be evaluated due to unresponsive patient. Common bile duct: Diameter: Less than 4 mm. No intra or extrahepatic bile duct dilation. Liver: There is increased hepatic echogenicity which reduces the sensitivity of ultrasound for the detection of focal masses. That being said, no focal mass is identified. Portal vein is patent on color Doppler imaging with normal direction of blood flow towards the liver. Other: None. IMPRESSION: 1. Cholelithiasis without sonographic evidence of acute cholecystitis. 2. Increased hepatic echogenicity, a nonspecific finding that is most commonly seen on the basis of steatosis in the absence of known liver disease. Electronically Signed   By: Jules Schick M.D.   On: 06/07/2023 15:30   CT Head Wo Contrast  Result Date: 06/07/2023 CLINICAL DATA:  Mental status change, unknown cause  EXAM: CT HEAD WITHOUT CONTRAST TECHNIQUE: Contiguous axial images were obtained from the base of the skull through the vertex without intravenous contrast. RADIATION DOSE REDUCTION: This exam was performed according to the departmental dose-optimization program which includes automated exposure control, adjustment of the mA and/or kV according to patient size and/or use of iterative reconstruction technique. COMPARISON:  05/27/2023 FINDINGS: Brain: No evidence of acute infarction, hemorrhage, hydrocephalus, extra-axial collection or mass lesion/mass effect. Vascular: No hyperdense vessel or unexpected calcification. Skull: Normal. Negative for fracture or focal lesion. Sinuses/Orbits: No acute finding. Other: None. IMPRESSION: No acute intracranial findings. Electronically Signed   By: Duanne Guess D.O.   On: 06/07/2023 12:44   DG Chest Portable 1 View  Result Date: 06/07/2023 CLINICAL DATA:  Altered mental status EXAM: PORTABLE CHEST 1 VIEW COMPARISON:  05/28/2023 FINDINGS: The heart size and mediastinal contours are within normal limits. Slightly low lung volumes. No focal airspace consolidation, pleural effusion, or pneumothorax. The visualized skeletal structures are unremarkable. IMPRESSION: No active disease. Electronically Signed   By: Duanne Guess D.O.   On: 06/07/2023 12:41    Pending Labs Unresulted Labs (From admission, onward)     Start     Ordered   06/08/23 0500  Comprehensive metabolic panel  Tomorrow morning,   R        06/07/23 1648   06/08/23 0500  Ammonia  Tomorrow morning,   R        06/07/23 1650   06/07/23 1741  Pregnancy, urine  Once,   R        06/07/23 1740   06/07/23 1708  Protime-INR  Once,   STAT        06/07/23 1707   06/07/23 1658  Lipid panel  Add-on,   AD        06/07/23 1657   06/07/23 1652  Lipase, blood  Add-on,   AD        06/07/23 1651   06/07/23 1648  Hepatitis panel, acute  Once,   URGENT        06/07/23 1647             Vitals/Pain Today's Vitals   06/07/23 1130 06/07/23 1200 06/07/23 1445 06/07/23 1550  BP: 124/61 (!) 142/63 125/65   Pulse: (!) 111 (!) 109 (!) 117   Resp: (!) 21 17 15    Temp: (!) 97.4 F (36.3 C)   97.7 F (36.5 C)  TempSrc:  Oral  SpO2: 97% 97% 99%     Isolation Precautions No active isolations  Medications Medications  sodium chloride 0.9 % bolus 1,000 mL (has no administration in time range)  0.9 %  sodium chloride infusion (has no administration in time range)  insulin glargine-yfgn (SEMGLEE) injection 22 Units (has no administration in time range)  insulin lispro (HUMALOG) KwikPen 6 Units (has no administration in time range)  pantoprazole (PROTONIX) EC tablet 40 mg (has no administration in time range)  albuterol (VENTOLIN HFA) 108 (90 Base) MCG/ACT inhaler 1 puff (has no administration in time range)  loratadine (CLARITIN) tablet 10 mg (has no administration in time range)  Fluticasone Propionate (Inhal) AEPB 1 Inhalation (has no administration in time range)  montelukast (SINGULAIR) tablet 10 mg (has no administration in time range)  beclomethasone (QVAR) 40 MCG/ACT inhaler 1 puff (has no administration in time range)  enoxaparin (LOVENOX) injection 40 mg (has no administration in time range)  ondansetron (ZOFRAN) tablet 4 mg (has no administration in time range)    Or  ondansetron (ZOFRAN) injection 4 mg (has no administration in time range)  acetaminophen (TYLENOL) tablet 650 mg (has no administration in time range)    Or  acetaminophen (TYLENOL) suppository 650 mg (has no administration in time range)  sodium chloride 0.9 % bolus 1,000 mL (1,000 mLs Intravenous New Bag/Given 06/07/23 1321)  insulin aspart (novoLOG) injection 8 Units (8 Units Intravenous Given 06/07/23 1434)    Mobility walks with person assist     Focused Assessments Neuro Assessment Handoff:  Swallow screen pass?  N/A         Neuro Assessment: Exceptions to WDL Neuro Checks:       Has TPA been given? No If patient is a Neuro Trauma and patient is going to OR before floor call report to 4N Charge nurse: [phone] or [phone]   R Recommendations: See Admitting Provider Note  Report given to:   Additional Notes:

## 2023-06-07 NOTE — ED Provider Notes (Signed)
[place] EMERGENCY DEPARTMENT AT [facility] Provider Note   CSN: [id number] Arrival date & time: 06/07/23  1121     History  Chief Complaint  Patient presents with  . Altered Mental Status    Frances Rodgers is a 52 y.o. female.  Presenting to the emergency department from her primary care recently admitted to [facility] discharge on 7/18 for DKA, and possible sepsis secondary to UTI.  EMS reported patient with decline the past day or 2, this morning was quite somnolent when she arrived at primary doctor.  Patient is sleepy, but denies any pain.  She is able to tell me her name and that she is at [facility] in the year.  However, I am unable to get a detailed history secondary to patient's mental status. Altered Mental Status      Home Medications Prior to Admission medications   Medication Sig Start Date End Date Taking? Authorizing Provider  albuterol (VENTOLIN HFA) 108 (90 Base) MCG/ACT inhaler Inhale into the lungs every 6 (six) hours as needed for wheezing or shortness of breath.    [provider]  Blood Glucose Monitoring Suppl DEVI 1 each by Does not apply route 3 (three) times daily. May dispense any manufacturer covered by patient's insurance. 06/01/23   Darlin Priestly, MD  Cetirizine HCl 10 MG CAPS Take 10 mg by mouth daily as needed. 06/02/03   [provider]  FLOVENT DISKUS 50 MCG/ACT AEPB Take 1 Inhalation by mouth every 12 (twelve) hours as needed.    [provider]  Glucose Blood (BLOOD GLUCOSE TEST STRIPS) STRP 1 each by Does not apply route 3 (three) times daily. Use as directed to check blood sugar. May dispense any manufacturer covered by patient's insurance and fits patient's device. 06/01/23   Darlin Priestly, MD  insulin glargine (LANTUS) 100 UNIT/ML Solostar Pen Inject 22 Units into the skin at bedtime. May substitute as needed per insurance. 06/01/23 08/30/23  Darlin Priestly, MD  insulin lispro (HUMALOG) 100 UNIT/ML KwikPen Inject 6 Units  into the skin 3 (three) times daily with meals. Only take if eating a meal AND Blood Glucose (BG) is 80 or higher. 06/01/23 08/30/23  Darlin Priestly, MD  Insulin Pen Needle (PEN NEEDLES) 31G X 5 MM MISC 1 each by Does not apply route 3 (three) times daily. May dispense any manufacturer covered by patient's insurance. 06/01/23   Darlin Priestly, MD  JUNEL FE 24 1-20 MG-MCG(24) tablet TAKE 1 TABLET BY MOUTH DAILY 02/14/23   Hildred Laser, MD  Lancet Device MISC 1 each by Does not apply route 3 (three) times daily. May dispense any manufacturer covered by patient's insurance. 06/01/23   Darlin Priestly, MD  Lancets MISC 1 each by Does not apply route 3 (three) times daily. Use as directed to check blood sugar. May dispense any manufacturer covered by patient's insurance and fits patient's device. 06/01/23   Darlin Priestly, MD  montelukast (SINGULAIR) 10 MG tablet Take 10 mg by mouth at bedtime.    [provider]  Multiple Vitamin (MULTI VITAMIN DAILY PO) Take 1 tablet by mouth daily.    [provider]  pantoprazole (PROTONIX) 40 MG tablet ONE TABLE 30 MINUTES PRIOR TO FIRST MEAL OF DAY 04/12/19   [provider]  QVAR REDIHALER 40 MCG/ACT inhaler Inhale 1 puff into the lungs 2 (two) times daily. 12/09/22   [provider]      Allergies    Aspirin, Ketorolac, and Ketorolac tromethamine  Review of Systems   Review of Systems  Physical Exam Updated Vital Signs BP 125/65   Pulse (!) 117   Temp (!) 97.4 F (36.3 C)   Resp 15   SpO2 99%  Physical Exam Vitals and nursing note reviewed.  Constitutional:      Appearance: She is obese.     Comments: Somnolent  HENT:     Head: Normocephalic.     Nose: Nose normal.     Mouth/Throat:     Mouth: Mucous membranes are moist.  Eyes:     Pupils: Pupils are equal, round, and reactive to light.  Cardiovascular:     Rate and Rhythm: Normal rate and regular rhythm.  Pulmonary:     Effort: Pulmonary effort is normal.     Breath sounds:  Normal breath sounds.  Abdominal:     General: Abdomen is flat. There is no distension.     Palpations: Abdomen is soft.     Tenderness: There is no abdominal tenderness. There is no guarding or rebound.  Musculoskeletal:     Right lower leg: Edema present.     Left lower leg: Edema present.  Skin:    General: Skin is warm and dry.     Capillary Refill: Capillary refill takes less than 2 seconds.  Neurological:     Mental Status: She is oriented to person, place, and time.     Comments: She is able to move all extremities with equal strength..  Exam limited by patient's current mentation/participation.      ED Results / Procedures / Treatments   Labs (all labs ordered are listed, but only abnormal results are displayed) Labs Reviewed  COMPREHENSIVE METABOLIC PANEL - Abnormal; Notable for the following components:      Result Value   Sodium 134 (*)    CO2 18 (*)    Glucose, Bld 443 (*)    BUN 57 (*)    Creatinine, Ser 1.33 (*)    Calcium 8.7 (*)    Albumin 2.1 (*)    AST 168 (*)    ALT 917 (*)    Alkaline Phosphatase 419 (*)    GFR, Estimated 49 (*)    Anion gap 17 (*)    All other components within normal limits  CBC WITH DIFFERENTIAL/PLATELET - Abnormal; Notable for the following components:   WBC 14.4 (*)    Hemoglobin 10.8 (*)    MCV 77.5 (*)    MCH 23.2 (*)    MCHC 29.9 (*)    RDW 16.6 (*)    nRBC 1.2 (*)    Neutro Abs 11.7 (*)    All other components within normal limits  URINALYSIS, ROUTINE W REFLEX MICROSCOPIC - Abnormal; Notable for the following components:   APPearance HAZY (*)    Glucose, UA 150 (*)    Ketones, ur 20 (*)    Protein, ur 30 (*)    Bacteria, UA RARE (*)    All other components within normal limits  AMMONIA - Abnormal; Notable for the following components:   Ammonia 39 (*)    All other components within normal limits  ACETAMINOPHEN LEVEL - Abnormal; Notable for the following components:   Acetaminophen (Tylenol), Serum <10 (*)    All  other components within normal limits  CBG MONITORING, ED - Abnormal; Notable for the following components:   Glucose-Capillary 432 (*)    All other components within normal limits  I-STAT CG4 LACTIC ACID, ED - Abnormal; Notable for the  following components:   Lactic Acid, Venous 2.3 (*)    All other components within normal limits  CBG MONITORING, ED - Abnormal; Notable for the following components:   Glucose-Capillary 443 (*)    All other components within normal limits  I-STAT VENOUS BLOOD GAS, ED - Abnormal; Notable for the following components:   pH, Ven 7.437 (*)    pCO2, Ven 29.4 (*)    pO2, Ven 26 (*)    Bicarbonate 19.9 (*)    TCO2 21 (*)    Acid-base deficit 3.0 (*)    Sodium 134 (*)    Calcium, Ion 1.09 (*)    All other components within normal limits  I-STAT CG4 LACTIC ACID, ED - Abnormal; Notable for the following components:   Lactic Acid, Venous 2.3 (*)    All other components within normal limits  RAPID URINE DRUG SCREEN, HOSP PERFORMED  ETHANOL    EKG None  Radiology US Abdomen Limited RUQ (LIVER/GB)  Result Date: 06/07/2023 CLINICAL DATA:  [id number] AMS (altered mental status) [id number] Transaminitis [id number] EXAM: ULTRASOUND ABDOMEN LIMITED RIGHT UPPER QUADRANT COMPARISON:  None Available. FINDINGS: Gallbladder: Small volume layering gallstones/sludge noted. No abnormal wall thickening or pericholecystic fluid. Sonographic Murphy's sign could not be evaluated due to unresponsive patient. Common bile duct: Diameter: Less than 4 mm. No intra or extrahepatic bile duct dilation. Liver: There is increased hepatic echogenicity which reduces the sensitivity of ultrasound for the detection of focal masses. That being said, no focal mass is identified. Portal vein is patent on color Doppler imaging with normal direction of blood flow towards the liver. Other: None. IMPRESSION: 1. Cholelithiasis without sonographic evidence of acute cholecystitis. 2. Increased hepatic  echogenicity, a nonspecific finding that is most commonly seen on the basis of steatosis in the absence of known liver disease. Electronically Signed   By: Jules Schick M.D.   On: 06/07/2023 15:30   CT Head Wo Contrast  Result Date: 06/07/2023 CLINICAL DATA:  Mental status change, unknown cause EXAM: CT HEAD WITHOUT CONTRAST TECHNIQUE: Contiguous axial images were obtained from the base of the skull through the vertex without intravenous contrast. RADIATION DOSE REDUCTION: This exam was performed according to the departmental dose-optimization program which includes automated exposure control, adjustment of the mA and/or kV according to patient size and/or use of iterative reconstruction technique. COMPARISON:  05/27/2023 FINDINGS: Brain: No evidence of acute infarction, hemorrhage, hydrocephalus, extra-axial collection or mass lesion/mass effect. Vascular: No hyperdense vessel or unexpected calcification. Skull: Normal. Negative for fracture or focal lesion. Sinuses/Orbits: No acute finding. Other: None. IMPRESSION: No acute intracranial findings. Electronically Signed   By: Duanne Guess D.O.   On: 06/07/2023 12:44   DG Chest Portable 1 View  Result Date: 06/07/2023 CLINICAL DATA:  Altered mental status EXAM: PORTABLE CHEST 1 VIEW COMPARISON:  05/28/2023 FINDINGS: The heart size and mediastinal contours are within normal limits. Slightly low lung volumes. No focal airspace consolidation, pleural effusion, or pneumothorax. The visualized skeletal structures are unremarkable. IMPRESSION: No active disease. Electronically Signed   By: Duanne Guess D.O.   On: 06/07/2023 12:41    Procedures Procedures    Medications Ordered in ED Medications  sodium chloride 0.9 % bolus 1,000 mL (1,000 mLs Intravenous New Bag/Given 06/07/23 1321)  insulin aspart (novoLOG) injection 8 Units (8 Units Intravenous Given 06/07/23 1434)    ED Course/ Medical Decision Making/ A&P Clinical Course as of 06/07/23 1541   Wed Jun 07, 2023  1202 Glucose-Capillary(!): 432 [TY]  1202 Lactic Acid, Venous(!!): 2.3 [TY]  1202 I-Stat venous blood gas, ([facility] ED, [facility], [facility])(!!) Not consistent with DKA. [TY]  1408 CT Head Wo Contrast IMPRESSION: No acute intracranial findings.   [TY]  1408 DG Chest Portable 1 View IMPRESSION: No active disease.   [TY]  1426 Comprehensive metabolic panel(!) Appears to have new transaminitis.  Will add on Tylenol level.  Ammonia not significantly elevated that would explain patient's mentation today.  Will get right upper quadrant ultrasound.  Per chart review, does not appear to be on any medications that would induce hepatitis [TY]  1511 US Abdomen Limited RUQ (LIVER/GB) Appears to have cholelithiasis, gallbladder wall does not appear to be thickened.  Common bile duct appears to be normal size on my independent interpretation. Awaiting Radiology final read.  [TY]  1512 Patient seemingly has waxing and waning mentation/somnolence.  No overt external sources of infection identified on exam or workup today.  Heart rate slightly tachycardic.  Received IV fluids.  Patient is hyperglycemic, but does not appear to be in DKA based on pH.  Does have a bicarb of 18 and a gap of 17.  May be early DKA?  Labs showed transaminitis which appears to be new.  She has a soft nontender abdomen.  Ultrasound with stones, but does not appear to be consistent with cholecystitis, low suspicion for choledocholithiasis.  UA negative for UTI.  Given patient's altered mental status somnolence CT head obtained, negative.  Chest x-ray without pneumonia.  Unclear etiology of patient's somnolence/altered mental status.  She was given insulin here.  Ammonia with borderline elevation.  UDS negative.  Alcohol negative.  Given patient's age, comorbid medical conditions and not to her neurologic baseline will admit for further management.  Low suspicion for acute stroke as she has no neurologic deficits. [TY]  1541 US Abdomen  Limited RUQ (LIVER/GB) IMPRESSION: 1. Cholelithiasis without sonographic evidence of acute cholecystitis. 2. Increased hepatic echogenicity, a nonspecific finding that is most commonly seen on the basis of steatosis in the absence of known liver disease.   [TY]    Clinical Course User Index [TY] Coral Spikes, DO                             Medical Decision Making 52 year old female present emergency department for altered mental status.  EMS reports stable vitals and round and symptoms have been progressive over the past 2 to 3 days.  Patient is somnolent, but alert and oriented x 3 when awoken.  Denies any pain.  No focal deficits on exam.  Per chart review was recently admitted for DKA and sepsis thought to be secondary to UTI.  Will get broad altered mental status workup.  CT head to evaluate for acute intracranial pathology.  See ED course for further MDM and disposition.  Amount and/or Complexity of Data Reviewed Independent Historian: EMS    Details: Reported stable vitals and route.  No fever. External Data Reviewed: notes.    Details: See above Labs: ordered. Decision-making details documented in ED Course. Radiology: ordered. Decision-making details documented in ED Course. ECG/medicine tests:  Decision-making details documented in ED Course.  Risk Decision regarding hospitalization.        Final Clinical Impression(s) / ED Diagnoses Final diagnoses:  Somnolence  Hyperglycemia  Transaminitis    Rx / DC Orders ED Discharge Orders     None         Coral Spikes,  DO 06/07/23 1539

## 2023-06-07 NOTE — Addendum Note (Signed)
Addended by: Herminio Commons A on: 06/07/2023 10:51 AM   Modules accepted: Orders

## 2023-06-07 NOTE — Progress Notes (Signed)
Subjective:  Frances Rodgers is a 52 y.o. female who presents for Chief Complaint  Patient presents with   Hospitalization Follow-up    Hospital follow-up- is out of it still, not eating, BS was 474. BS was coming down since getting out but now its back up.      Here today for hospital follow-up.  She is accompanied by sister Toniann Fail and daughter Delorise Jackson.   This is my first visit with her  History mainly provided by family.  Prior to this hospitalization, no hx/o diabetes.  Lives alone.  Daughter initially called EMS which prompted this hospital visit on 05/27/23.    Initially when she came home from the hospital she was doing okay, her daughter was helping monitor her.  Her daughter has been giving her the insulin, the fact that Humalog 6 units 3 times a day with meals and Lantus 22 units at night.  2 days ago on Monday she was eating, feeling relatively okay, talkative and not seeming confused.  Yesterday not eating as much, more lethargic, but still somewhat talkative.  Yesterday her sister called and when she did respond fine she went to check on her.  Today however she was really weak, did not want to get out of bed.  Family had a hard time just getting her out of the house to come in today, seeming lethargic and not talking very much.  She is eating very little yesterday evening not eating this morning.  She had some slurred speech yesterday apparently.  She is a non-smoker, no alcohol use, no drug use.  Prior to this hospitalization she had never had any diagnosis of liver disease, heart disease, diabetes or stroke.  I asked [name] directly today how she is feeling and she could barely mumble a few words, seemed lethargic and confused.   Hospitalized recently from 05/27/2023 - 06/01/2023  Discharge Diagnoses:  Principal Problem:   DKA (diabetic ketoacidosis) (HCC) Active Problems:   Hyperkalemia   Acute metabolic encephalopathy   Hypernatremia   Sepsis (HCC)   Shoulder pain,  left   AKI (acute kidney injury) (HCC)   Acute urinary retention   Abnormal urinalysis   Asthma   GERD without esophagitis   Elevated blood pressure reading   Positive D dimer   Obesity (BMI 30-39.9)    No other aggravating or relieving factors.    No other c/o.  Past Medical History:  Diagnosis Date   Allergy    Anemia    Asthma    Blood transfusion without reported diagnosis 2023   GERD (gastroesophageal reflux disease)    Seasonal allergies    Current Outpatient Medications on File Prior to Visit  Medication Sig Dispense Refill   albuterol (VENTOLIN HFA) 108 (90 Base) MCG/ACT inhaler Inhale into the lungs every 6 (six) hours as needed for wheezing or shortness of breath.     Cetirizine HCl 10 MG CAPS Take 10 mg by mouth daily as needed.     FLOVENT DISKUS 50 MCG/ACT AEPB Take 1 Inhalation by mouth every 12 (twelve) hours as needed.     insulin glargine (LANTUS) 100 UNIT/ML Solostar Pen Inject 22 Units into the skin at bedtime. May substitute as needed per insurance. 6.6 mL 2   insulin lispro (HUMALOG) 100 UNIT/ML KwikPen Inject 6 Units into the skin 3 (three) times daily with meals. Only take if eating a meal AND Blood Glucose (BG) is 80 or higher. 5.4 mL 2   JUNEL FE 24 1-20 MG-MCG(24)  tablet TAKE 1 TABLET BY MOUTH DAILY 84 tablet 3   montelukast (SINGULAIR) 10 MG tablet Take 10 mg by mouth at bedtime.     Multiple Vitamin (MULTI VITAMIN DAILY PO) Take 1 tablet by mouth daily.     pantoprazole (PROTONIX) 40 MG tablet ONE TABLE 30 MINUTES PRIOR TO FIRST MEAL OF DAY     QVAR REDIHALER 40 MCG/ACT inhaler Inhale 1 puff into the lungs 2 (two) times daily.     Blood Glucose Monitoring Suppl DEVI 1 each by Does not apply route 3 (three) times daily. May dispense any manufacturer covered by patient's insurance. 1 each 0   Glucose Blood (BLOOD GLUCOSE TEST STRIPS) STRP 1 each by Does not apply route 3 (three) times daily. Use as directed to check blood sugar. May dispense any  manufacturer covered by patient's insurance and fits patient's device. 100 strip 0   Insulin Pen Needle (PEN NEEDLES) 31G X 5 MM MISC 1 each by Does not apply route 3 (three) times daily. May dispense any manufacturer covered by patient's insurance. 100 each 0   Lancet Device MISC 1 each by Does not apply route 3 (three) times daily. May dispense any manufacturer covered by patient's insurance. 1 each 0   Lancets MISC 1 each by Does not apply route 3 (three) times daily. Use as directed to check blood sugar. May dispense any manufacturer covered by patient's insurance and fits patient's device. 100 each 0   No current facility-administered medications on file prior to visit.     The following portions of the patient's history were reviewed and updated as appropriate: allergies, current medications, past family history, past medical history, past social history, past surgical history and problem list.  ROS Otherwise as in subjective above      Objective: BP 100/64   Pulse 100   Temp (!) 97.5 F (36.4 C)   BP Readings from Last 3 Encounters:  06/07/23 100/64  06/01/23 104/67  04/12/23 126/80   Wt Readings from Last 3 Encounters:  05/27/23 224 lb 13.9 oz (102 kg)  04/12/23 224 lb 12.8 oz (102 kg)  03/16/23 217 lb 2 oz (98.5 kg)    General appearance: Somewhat slumped in the chair, seated in a wheelchair.  She seems very lethargic this morning. She barely responded to triage nurse Cannot talk to her today she barely mumbled her birthdate when asked, not very responsive to answering questions She is looking around the glassey eyed Heart rate is somewhat weak but faster and 100, no murmurs Her extremities are somewhat cool to touch arms and legs Pulse thready Mild 1+ nonpitting edema of lower extremities Neuro: She would not respond to extraocular motions, barely responded to my questions to perform the CN II through XII exam Psych: Lethargic, not very  responsive    Assessment: Encounter Diagnoses  Name Primary?   Encephalopathy Yes   History of recent hospitalization    Lethargy    Low body temperature    Hyperglycemia      Plan: I reviewed her recent hospitalization notes, discharge summary, medicines were reconciled.  After initial triage and quick review of her record, it is obvious that she is still having some encephalopathy, lethargy, cool body temperature, cool extremities, and blood sugars in the last day over 400 despite the fact she is not eating very much.  Over the last 3 days the family noticed that she has went from responsive and talkative to all of a sudden weak, not talkative,  not able to take care of herself.  We made decision to have her seen back in the hospital as she will likely be readmitted.  EMS was called for transport.  Family agrees with this decision.   Blood sugars at home have ranged from 300 down to as low as mid 100s all the way back up to 400s today and last night and she has not eaten much at all in the last 24 hours  She was just hospitalized for sepsis, DKA, encephalopathy, hyperkalemia, hypernatremia and metabolic derangement    Alanie "[name]" was seen today for hospitalization follow-up.  Diagnoses and all orders for this visit:  Encephalopathy  History of recent hospitalization  Lethargy  Low body temperature  Hyperglycemia    Follow up: Transported to hospital by EMS

## 2023-06-07 NOTE — Inpatient Diabetes Management (Signed)
Inpatient Diabetes Program Recommendations  AACE/ADA: New Consensus Statement on Inpatient Glycemic Control (2015)  Target Ranges:  Prepandial:   less than 140 mg/dL      Peak postprandial:   less than 180 mg/dL (1-2 hours)      Critically ill patients:  140 - 180 mg/dL   Lab Results  Component Value Date   GLUCAP 432 (H) 06/07/2023   HGBA1C 11.7 (H) 05/28/2023    Review of Glycemic Control  Latest Reference Range & Units 06/07/23 11:31  Glucose-Capillary 70 - 99 mg/dL 952 (H)  (H): Data is abnormally high  Diabetes history: New DM2 Outpatient Diabetes medications: Lantus 22 units at bedtime, Humalog 6 units TID  Patient recently discharged from [facility] last week with DKA,  new dx of diabetes and anew to insulin.  Spoke with daughter briefly in ED.  She takes care of her mom and administers her insulins and checks her BG.  She has her log book with her.  She states her mom was doing well after discharge.  On July 22nd she noticed her BG was increasing.  Patient is only drinking water and not eating much at all.    Asked daughter if they miss any insulin doses.  She states she did miss her Lantus last night because her mom fell asleep.  Asked when she starting becoming more lethargic and daughter states, yesterday.    Daughter is doing a good job with documenting her mom's DM regimen.  Explained that even if mom falls asleep, she needs her basal insulin.  Daughter verbalizes understanding.  Will continue to follow while inpatient.  Thank you, Frances Sellar, MSN, CDCES Diabetes Coordinator Inpatient Diabetes Program [phone] (team pager from 8a-5p)

## 2023-06-07 NOTE — Progress Notes (Signed)
Dear Doctor: This patient has been identified as a candidate for PICC for the following reason (s): IV therapy over 48 hours and poor veins/poor circulatory system (CHF, COPD, emphysema, diabetes, steroid use, IV drug abuse, etc.) If you agree, please write an order for the indicated device.   Thank you for supporting the early vascular access assessment program. 

## 2023-06-07 NOTE — ED Provider Notes (Incomplete)
[place] EMERGENCY DEPARTMENT AT [facility] Provider Note   CSN: [id number] Arrival date & time: 06/07/23  1121     History {Add pertinent medical, surgical, social history, OB history to HPI:1} Chief Complaint  Patient presents with  . Altered Mental Status    Frances Rodgers is a 52 y.o. female.  Presenting to the emergency department from her primary care recently admitted to [facility] discharge on 7/18 for DKA, and possible sepsis secondary to UTI.  EMS reported patient with decline the past day or 2, this morning was quite somnolent when she arrived at primary doctor.  Patient is sleepy, but denies any pain.  She is able to tell me her name and that she is at [facility] in the year.  However, I am unable to get a detailed history secondary to patient's mental status. Altered Mental Status      Home Medications Prior to Admission medications   Medication Sig Start Date End Date Taking? Authorizing Provider  albuterol (VENTOLIN HFA) 108 (90 Base) MCG/ACT inhaler Inhale into the lungs every 6 (six) hours as needed for wheezing or shortness of breath.    [provider]  Blood Glucose Monitoring Suppl DEVI 1 each by Does not apply route 3 (three) times daily. May dispense any manufacturer covered by patient's insurance. 06/01/23   Darlin Priestly, MD  Cetirizine HCl 10 MG CAPS Take 10 mg by mouth daily as needed. 06/02/03   [provider]  FLOVENT DISKUS 50 MCG/ACT AEPB Take 1 Inhalation by mouth every 12 (twelve) hours as needed.    [provider]  Glucose Blood (BLOOD GLUCOSE TEST STRIPS) STRP 1 each by Does not apply route 3 (three) times daily. Use as directed to check blood sugar. May dispense any manufacturer covered by patient's insurance and fits patient's device. 06/01/23   Darlin Priestly, MD  insulin glargine (LANTUS) 100 UNIT/ML Solostar Pen Inject 22 Units into the skin at bedtime. May substitute as needed per insurance. 06/01/23 08/30/23  Darlin Priestly, MD  insulin lispro (HUMALOG) 100 UNIT/ML KwikPen Inject 6 Units into the skin 3 (three) times daily with meals. Only take if eating a meal AND Blood Glucose (BG) is 80 or higher. 06/01/23 08/30/23  Darlin Priestly, MD  Insulin Pen Needle (PEN NEEDLES) 31G X 5 MM MISC 1 each by Does not apply route 3 (three) times daily. May dispense any manufacturer covered by patient's insurance. 06/01/23   Darlin Priestly, MD  JUNEL FE 24 1-20 MG-MCG(24) tablet TAKE 1 TABLET BY MOUTH DAILY 02/14/23   Hildred Laser, MD  Lancet Device MISC 1 each by Does not apply route 3 (three) times daily. May dispense any manufacturer covered by patient's insurance. 06/01/23   Darlin Priestly, MD  Lancets MISC 1 each by Does not apply route 3 (three) times daily. Use as directed to check blood sugar. May dispense any manufacturer covered by patient's insurance and fits patient's device. 06/01/23   Darlin Priestly, MD  montelukast (SINGULAIR) 10 MG tablet Take 10 mg by mouth at bedtime.    [provider]  Multiple Vitamin (MULTI VITAMIN DAILY PO) Take 1 tablet by mouth daily.    [provider]  pantoprazole (PROTONIX) 40 MG tablet ONE TABLE 30 MINUTES PRIOR TO FIRST MEAL OF DAY 04/12/19   [provider]  QVAR REDIHALER 40 MCG/ACT inhaler Inhale 1 puff into the lungs 2 (two) times daily. 12/09/22   [provider]  Allergies    Aspirin, Ketorolac, and Ketorolac tromethamine    Review of Systems   Review of Systems  Physical Exam Updated Vital Signs There were no vitals taken for this visit. Physical Exam Vitals and nursing note reviewed.  Constitutional:      Appearance: She is obese.     Comments: Somnolent  HENT:     Head: Normocephalic.     Nose: Nose normal.     Mouth/Throat:     Mouth: Mucous membranes are moist.  Eyes:     Pupils: Pupils are equal, round, and reactive to light.  Cardiovascular:     Rate and Rhythm: Normal rate and regular rhythm.  Pulmonary:     Effort: Pulmonary effort is  normal.     Breath sounds: Normal breath sounds.  Abdominal:     General: Abdomen is flat. There is no distension.     Palpations: Abdomen is soft.     Tenderness: There is no abdominal tenderness. There is no guarding or rebound.  Musculoskeletal:     Right lower leg: Edema present.     Left lower leg: Edema present.  Skin:    General: Skin is warm and dry.     Capillary Refill: Capillary refill takes less than 2 seconds.  Neurological:     Mental Status: She is oriented to person, place, and time.     Comments: She is able to move all extremities with equal strength..  Exam limited by patient's current mentation/participation.      ED Results / Procedures / Treatments   Labs (all labs ordered are listed, but only abnormal results are displayed) Labs Reviewed  CBG MONITORING, ED - Abnormal; Notable for the following components:      Result Value   Glucose-Capillary 432 (*)    All other components within normal limits  COMPREHENSIVE METABOLIC PANEL  CBC WITH DIFFERENTIAL/PLATELET  URINALYSIS, ROUTINE W REFLEX MICROSCOPIC  AMMONIA  RAPID URINE DRUG SCREEN, HOSP PERFORMED  ETHANOL  I-STAT CG4 LACTIC ACID, ED  CBG MONITORING, ED  I-STAT VENOUS BLOOD GAS, ED    EKG None  Radiology No results found.  Procedures Procedures  {Document cardiac monitor, telemetry assessment procedure when appropriate:1}  Medications Ordered in ED Medications - No data to display  ED Course/ Medical Decision Making/ A&P   {   Click here for ABCD2, HEART and other calculatorsREFRESH Note before signing :1}                          Medical Decision Making 52 year old female present emergency department for altered mental status.  EMS reports stable vitals and round and symptoms have been progressive over the past 2 to 3 days.  Patient is somnolent, but alert and oriented x 3 when awoken.  Denies any pain.  No focal deficits on exam.  Per chart review was recently admitted for DKA and  sepsis thought to be secondary to UTI.  Will get broad altered mental status workup.  CT head to evaluate for acute intracranial pathology.  See ED course for further MDM and disposition.  Amount and/or Complexity of Data Reviewed Independent Historian: EMS    Details: Reported stable vitals and route.  No fever. External Data Reviewed: notes.    Details: See above Labs: ordered. Decision-making details documented in ED Course. Radiology: ordered. Decision-making details documented in ED Course. ECG/medicine tests:  Decision-making details documented in ED Course.  Risk Decision regarding hospitalization.   ***  {  Document critical care time when appropriate:1} {Document review of labs and clinical decision tools ie heart score, Chads2Vasc2 etc:1}  {Document your independent review of radiology images, and any outside records:1} {Document your discussion with family members, caretakers, and with consultants:1} {Document social determinants of health affecting pt's care:1} {Document your decision making why or why not admission, treatments were needed:1} Final Clinical Impression(s) / ED Diagnoses Final diagnoses:  None    Rx / DC Orders ED Discharge Orders     None

## 2023-06-07 NOTE — H&P (Addendum)
History and Physical    Frances Rodgers WGN:[id number] DOB: 1971/09/08 DOA: 06/07/2023  PCP: Tollie Eth, NP (Confirm with patient/family/NH records and if not entered, this has to be entered at [facility] point of entry) Patient coming from: Home  I have personally briefly reviewed patient's old medical records in [facility] Link  Chief Complaint: Altered mentations  HPI: Frances Rodgers is a 52 y.o. female with medical history significant of mild intermittent asthma,, recently diagnosed new onset of IDDM brought in by family member for evaluation of mentation changes.  Time started 2 days ago, patient started to have feeling nauseous, frequent retching but no vomiting, denies any abdominal pain, no diarrhea no fever or chills.  Yesterday patient became lethargic and lying in the bed all day.  Daughter also noticed patient developed leg swelling bilaterally since yesterday and patient does complain about bilateral thigh pain.  Overall patient has not been eating drinking much for last 2 days but she does confirm that she continues to take insulin as instructed from last admission.  She denies any headache, no vision problems denies any numbness weakness of any of the limbs. ED Course: Afebrile, borderline tachycardia, SBP 120s, nonhypoxic, CT head negative for acute findings blood work, creatinine 1.3 about her baseline BUN 57, glucose 443, AST 160, ALT 917 alk phos 419 UDS negative.  Review of Systems: As per HPI otherwise 14 point review of systems negative.    Past Medical History:  Diagnosis Date   Allergy    Anemia    Asthma    Blood transfusion without reported diagnosis 2023   GERD (gastroesophageal reflux disease)    Seasonal allergies     Past Surgical History:  Procedure Laterality Date   GASTRIC BYPASS  2015   NASAL POLYP SURGERY  2010   WISDOM TOOTH EXTRACTION       reports that she has never smoked. She has never used smokeless tobacco. She reports that she does not  drink alcohol and does not use drugs.  Allergies  Allergen Reactions   Aspirin Anaphylaxis and Shortness Of Breath   Ketorolac Anaphylaxis   Ketorolac Tromethamine Shortness Of Breath    Family History  Problem Relation Age of Onset   Diabetes Mother    Stroke Mother    Diabetes Brother    Stroke Brother    Pancreatic cancer Maternal Grandmother      Prior to Admission medications   Medication Sig Start Date End Date Taking? Authorizing Provider  albuterol (VENTOLIN HFA) 108 (90 Base) MCG/ACT inhaler Inhale into the lungs every 6 (six) hours as needed for wheezing or shortness of breath.    [provider]  Blood Glucose Monitoring Suppl DEVI 1 each by Does not apply route 3 (three) times daily. May dispense any manufacturer covered by patient's insurance. 06/01/23   Darlin Priestly, MD  Cetirizine HCl 10 MG CAPS Take 10 mg by mouth daily as needed. 06/02/03   [provider]  FLOVENT DISKUS 50 MCG/ACT AEPB Take 1 Inhalation by mouth every 12 (twelve) hours as needed.    [provider]  Glucose Blood (BLOOD GLUCOSE TEST STRIPS) STRP 1 each by Does not apply route 3 (three) times daily. Use as directed to check blood sugar. May dispense any manufacturer covered by patient's insurance and fits patient's device. 06/01/23   Darlin Priestly, MD  insulin glargine (LANTUS) 100 UNIT/ML Solostar Pen Inject 22 Units into the skin at bedtime. May substitute as needed per insurance. 06/01/23  08/30/23  Darlin Priestly, MD  insulin lispro (HUMALOG) 100 UNIT/ML KwikPen Inject 6 Units into the skin 3 (three) times daily with meals. Only take if eating a meal AND Blood Glucose (BG) is 80 or higher. 06/01/23 08/30/23  Darlin Priestly, MD  Insulin Pen Needle (PEN NEEDLES) 31G X 5 MM MISC 1 each by Does not apply route 3 (three) times daily. May dispense any manufacturer covered by patient's insurance. 06/01/23   Darlin Priestly, MD  JUNEL FE 24 1-20 MG-MCG(24) tablet TAKE 1 TABLET BY MOUTH DAILY 02/14/23   Hildred Laser, MD  Lancet Device MISC 1 each by Does not apply route 3 (three) times daily. May dispense any manufacturer covered by patient's insurance. 06/01/23   Darlin Priestly, MD  Lancets MISC 1 each by Does not apply route 3 (three) times daily. Use as directed to check blood sugar. May dispense any manufacturer covered by patient's insurance and fits patient's device. 06/01/23   Darlin Priestly, MD  montelukast (SINGULAIR) 10 MG tablet Take 10 mg by mouth at bedtime.    [provider]  Multiple Vitamin (MULTI VITAMIN DAILY PO) Take 1 tablet by mouth daily.    [provider]  pantoprazole (PROTONIX) 40 MG tablet ONE TABLE 30 MINUTES PRIOR TO FIRST MEAL OF DAY 04/12/19   [provider]  QVAR REDIHALER 40 MCG/ACT inhaler Inhale 1 puff into the lungs 2 (two) times daily. 12/09/22   [provider]    Physical Exam: Vitals:   06/07/23 1130 06/07/23 1200 06/07/23 1445 06/07/23 1550  BP: 124/61 (!) 142/63 125/65   Pulse: (!) 111 (!) 109 (!) 117   Resp: (!) 21 17 15    Temp: (!) 97.4 F (36.3 C)   97.7 F (36.5 C)  TempSrc:    Oral  SpO2: 97% 97% 99%     Constitutional: NAD, calm, comfortable Vitals:   06/07/23 1130 06/07/23 1200 06/07/23 1445 06/07/23 1550  BP: 124/61 (!) 142/63 125/65   Pulse: (!) 111 (!) 109 (!) 117   Resp: (!) 21 17 15    Temp: (!) 97.4 F (36.3 C)   97.7 F (36.5 C)  TempSrc:    Oral  SpO2: 97% 97% 99%    Eyes: PERRL, lids and conjunctivae normal ENMT: Mucous membranes are dry. Posterior pharynx clear of any exudate or lesions.Normal dentition.  Neck: normal, supple, no masses, no thyromegaly Respiratory: clear to auscultation bilaterally, no wheezing, no crackles. Normal respiratory effort. No accessory muscle use.  Cardiovascular: Regular rate and rhythm, no murmurs / rubs / gallops. 2+ extremity edema. 2+ pedal pulses. No carotid bruits.  Abdomen: no tenderness, no masses palpated. No hepatosplenomegaly. Bowel sounds positive.   Musculoskeletal: no clubbing / cyanosis. No joint deformity upper and lower extremities. Good ROM, no contractures. Normal muscle tone.  Skin: no rashes, lesions, ulcers. No induration Neurologic: CN 2-12 grossly intact. Sensation intact, DTR normal. Strength 5/5 in all 4.  Psychiatric: Normal judgment and insight. Alert and oriented x 3. Normal mood.     Labs on Admission: I have personally reviewed following labs and imaging studies  CBC: Recent Labs  Lab 06/01/23 0431 06/07/23 1152 06/07/23 1158  WBC 10.1 14.4*  --   NEUTROABS  --  11.7*  --   HGB 10.8* 10.8* 12.6  HCT 35.2* 36.1 37.0  MCV 77.2* 77.5*  --   PLT 276 397  --    Basic Metabolic Panel: Recent Labs  Lab 06/01/23 0431 06/07/23 1152 06/07/23 1158  NA  137 134* 134*  K 3.3* 4.1 4.1  CL 106 99  --   CO2 20* 18*  --   GLUCOSE 247* 443*  --   BUN 23* 57*  --   CREATININE 0.75 1.33*  --   CALCIUM 8.9 8.7*  --   MG 1.8  --   --    GFR: Estimated Creatinine Clearance: 59.9 mL/min (A) (by C-G formula based on SCr of 1.33 mg/dL (H)). Liver Function Tests: Recent Labs  Lab 06/07/23 1152  AST 168*  ALT 917*  ALKPHOS 419*  BILITOT 1.2  PROT 6.9  ALBUMIN 2.1*   No results for input(s): "LIPASE", "AMYLASE" in the last 168 hours. Recent Labs  Lab 06/07/23 1133  AMMONIA 39*   Coagulation Profile: No results for input(s): "INR", "PROTIME" in the last 168 hours. Cardiac Enzymes: No results for input(s): "CKTOTAL", "CKMB", "CKMBINDEX", "TROPONINI" in the last 168 hours. BNP (last 3 results) No results for input(s): "PROBNP" in the last 8760 hours. HbA1C: No results for input(s): "HGBA1C" in the last 72 hours. CBG: Recent Labs  Lab 05/31/23 2048 06/01/23 0853 06/07/23 1131 06/07/23 1359  GLUCAP 171* 283* 432* 443*   Lipid Profile: No results for input(s): "CHOL", "HDL", "LDLCALC", "TRIG", "CHOLHDL", "LDLDIRECT" in the last 72 hours. Thyroid Function Tests: No results for input(s): "TSH",  "T4TOTAL", "FREET4", "T3FREE", "THYROIDAB" in the last 72 hours. Anemia Panel: No results for input(s): "VITAMINB12", "FOLATE", "FERRITIN", "TIBC", "IRON", "RETICCTPCT" in the last 72 hours. Urine analysis:    Component Value Date/Time   COLORURINE YELLOW 06/07/2023 1227   APPEARANCEUR HAZY (A) 06/07/2023 1227   LABSPEC 1.017 06/07/2023 1227   PHURINE 6.0 06/07/2023 1227   GLUCOSEU 150 (A) 06/07/2023 1227   HGBUR NEGATIVE 06/07/2023 1227   BILIRUBINUR NEGATIVE 06/07/2023 1227   KETONESUR 20 (A) 06/07/2023 1227   PROTEINUR 30 (A) 06/07/2023 1227   NITRITE NEGATIVE 06/07/2023 1227   LEUKOCYTESUR NEGATIVE 06/07/2023 1227    Radiological Exams on Admission: US Abdomen Limited RUQ (LIVER/GB)  Result Date: 06/07/2023 CLINICAL DATA:  [id number] AMS (altered mental status) [id number] Transaminitis [id number] EXAM: ULTRASOUND ABDOMEN LIMITED RIGHT UPPER QUADRANT COMPARISON:  None Available. FINDINGS: Gallbladder: Small volume layering gallstones/sludge noted. No abnormal wall thickening or pericholecystic fluid. Sonographic Murphy's sign could not be evaluated due to unresponsive patient. Common bile duct: Diameter: Less than 4 mm. No intra or extrahepatic bile duct dilation. Liver: There is increased hepatic echogenicity which reduces the sensitivity of ultrasound for the detection of focal masses. That being said, no focal mass is identified. Portal vein is patent on color Doppler imaging with normal direction of blood flow towards the liver. Other: None. IMPRESSION: 1. Cholelithiasis without sonographic evidence of acute cholecystitis. 2. Increased hepatic echogenicity, a nonspecific finding that is most commonly seen on the basis of steatosis in the absence of known liver disease. Electronically Signed   By: Jules Schick M.D.   On: 06/07/2023 15:30   CT Head Wo Contrast  Result Date: 06/07/2023 CLINICAL DATA:  Mental status change, unknown cause EXAM: CT HEAD WITHOUT CONTRAST TECHNIQUE:  Contiguous axial images were obtained from the base of the skull through the vertex without intravenous contrast. RADIATION DOSE REDUCTION: This exam was performed according to the departmental dose-optimization program which includes automated exposure control, adjustment of the mA and/or kV according to patient size and/or use of iterative reconstruction technique. COMPARISON:  05/27/2023 FINDINGS: Brain: No evidence of acute infarction, hemorrhage, hydrocephalus, extra-axial collection or mass lesion/mass effect. Vascular: No  hyperdense vessel or unexpected calcification. Skull: Normal. Negative for fracture or focal lesion. Sinuses/Orbits: No acute finding. Other: None. IMPRESSION: No acute intracranial findings. Electronically Signed   By: Duanne Guess D.O.   On: 06/07/2023 12:44   DG Chest Portable 1 View  Result Date: 06/07/2023 CLINICAL DATA:  Altered mental status EXAM: PORTABLE CHEST 1 VIEW COMPARISON:  05/28/2023 FINDINGS: The heart size and mediastinal contours are within normal limits. Slightly low lung volumes. No focal airspace consolidation, pleural effusion, or pneumothorax. The visualized skeletal structures are unremarkable. IMPRESSION: No active disease. Electronically Signed   By: Duanne Guess D.O.   On: 06/07/2023 12:41    EKG: Independently reviewed.  Sinus rhythm, no acute ST changes.  Assessment/Plan Active Problems:   * No active hospital problems. *  (please populate well all problems here in Problem List. (For example, if patient is on BP meds at home and you resume or decide to hold them, it is a problem that needs to be her. Same for CAD, COPD, HLD and so on)  Acute metabolic encephalopathy -Probably secondary to profound acid-base abnormalities with combined acute metabolic acidosis and respiratory alkalosis -Other DDx, ammonium level borderline elevated, unlikely to cause patient's mentation changes and patient does not have a history of cirrhosis and RUQ  ultrasound showed only fatty liver.  Will check INR and recheck magnesium level tomorrow. -UDS negative, and patient was treated for UTI recently, UA showed no signs of infection, chest x-ray showed no acute infiltrates, will monitor off antibiotics. -Thyroid function was checked recently  Acute transaminitis -Unknown etiology, ultrasound showed no obstructive gallstones without signs of acute cholecystitis, and bilirubin level within normal limits -Will check lipase level -Given the recent abdominal symptoms of nausea and frequent retching, will check CT abdomen pelvis with contrast.  AKI -Likely secondary to poor oral intake and dehydration -IV fluid, recheck kidney function tomorrow  IDDM with starvation ketoacidosis -Hydration as above -Continue Lantus 22 units daily and NovoLog 6 units 3 times daily AC  Peripheral edema -Check DVT study -Check pro-BNP, outpatient echo  Chronic, mild intermittent asthma -No acute concern, continue ICS and as needed albuterol  DVT prophylaxis: Lovenox Code Status: Full code Family Communication: Daughter at bedside Disposition Plan: Expect less than 2 midnight hospital stay Consults called: None Admission status: Tele obs   Emeline General MD [facility] Pager [phone]  06/07/2023, 5:14 PM

## 2023-06-07 NOTE — ED Notes (Signed)
Help get patient into a gown on the monitor did EKG shown to er provider patient is resting with call bell in reach and nurse at bedside

## 2023-06-08 ENCOUNTER — Observation Stay (HOSPITAL_COMMUNITY): Payer: 59

## 2023-06-08 DIAGNOSIS — R600 Localized edema: Secondary | ICD-10-CM | POA: Diagnosis present

## 2023-06-08 DIAGNOSIS — Z6837 Body mass index (BMI) 37.0-37.9, adult: Secondary | ICD-10-CM | POA: Diagnosis not present

## 2023-06-08 DIAGNOSIS — J4520 Mild intermittent asthma, uncomplicated: Secondary | ICD-10-CM | POA: Diagnosis present

## 2023-06-08 DIAGNOSIS — M7989 Other specified soft tissue disorders: Secondary | ICD-10-CM | POA: Diagnosis not present

## 2023-06-08 DIAGNOSIS — E669 Obesity, unspecified: Secondary | ICD-10-CM | POA: Diagnosis present

## 2023-06-08 DIAGNOSIS — J45909 Unspecified asthma, uncomplicated: Secondary | ICD-10-CM | POA: Diagnosis not present

## 2023-06-08 DIAGNOSIS — K316 Fistula of stomach and duodenum: Secondary | ICD-10-CM | POA: Diagnosis present

## 2023-06-08 DIAGNOSIS — K9589 Other complications of other bariatric procedure: Secondary | ICD-10-CM | POA: Diagnosis not present

## 2023-06-08 DIAGNOSIS — R7401 Elevation of levels of liver transaminase levels: Secondary | ICD-10-CM | POA: Diagnosis present

## 2023-06-08 DIAGNOSIS — E873 Alkalosis: Secondary | ICD-10-CM | POA: Diagnosis present

## 2023-06-08 DIAGNOSIS — G9341 Metabolic encephalopathy: Secondary | ICD-10-CM | POA: Diagnosis present

## 2023-06-08 DIAGNOSIS — K449 Diaphragmatic hernia without obstruction or gangrene: Secondary | ICD-10-CM | POA: Diagnosis not present

## 2023-06-08 DIAGNOSIS — E874 Mixed disorder of acid-base balance: Secondary | ICD-10-CM | POA: Diagnosis present

## 2023-06-08 DIAGNOSIS — Z9884 Bariatric surgery status: Secondary | ICD-10-CM | POA: Diagnosis not present

## 2023-06-08 DIAGNOSIS — E785 Hyperlipidemia, unspecified: Secondary | ICD-10-CM | POA: Diagnosis present

## 2023-06-08 DIAGNOSIS — I5031 Acute diastolic (congestive) heart failure: Secondary | ICD-10-CM

## 2023-06-08 DIAGNOSIS — K8021 Calculus of gallbladder without cholecystitis with obstruction: Secondary | ICD-10-CM | POA: Diagnosis present

## 2023-06-08 DIAGNOSIS — K255 Chronic or unspecified gastric ulcer with perforation: Secondary | ICD-10-CM | POA: Diagnosis not present

## 2023-06-08 DIAGNOSIS — E8770 Fluid overload, unspecified: Secondary | ICD-10-CM | POA: Diagnosis not present

## 2023-06-08 DIAGNOSIS — K9189 Other postprocedural complications and disorders of digestive system: Secondary | ICD-10-CM | POA: Diagnosis not present

## 2023-06-08 DIAGNOSIS — K2289 Other specified disease of esophagus: Secondary | ICD-10-CM | POA: Diagnosis not present

## 2023-06-08 DIAGNOSIS — K651 Peritoneal abscess: Secondary | ICD-10-CM | POA: Diagnosis not present

## 2023-06-08 DIAGNOSIS — R7989 Other specified abnormal findings of blood chemistry: Secondary | ICD-10-CM | POA: Diagnosis not present

## 2023-06-08 DIAGNOSIS — E860 Dehydration: Secondary | ICD-10-CM | POA: Diagnosis present

## 2023-06-08 DIAGNOSIS — R400 Somnolence: Secondary | ICD-10-CM | POA: Diagnosis not present

## 2023-06-08 DIAGNOSIS — R112 Nausea with vomiting, unspecified: Secondary | ICD-10-CM

## 2023-06-08 DIAGNOSIS — E872 Acidosis, unspecified: Secondary | ICD-10-CM | POA: Diagnosis not present

## 2023-06-08 DIAGNOSIS — Y832 Surgical operation with anastomosis, bypass or graft as the cause of abnormal reaction of the patient, or of later complication, without mention of misadventure at the time of the procedure: Secondary | ICD-10-CM | POA: Diagnosis not present

## 2023-06-08 DIAGNOSIS — Z794 Long term (current) use of insulin: Secondary | ICD-10-CM | POA: Diagnosis not present

## 2023-06-08 DIAGNOSIS — N179 Acute kidney failure, unspecified: Secondary | ICD-10-CM | POA: Diagnosis present

## 2023-06-08 DIAGNOSIS — E1110 Type 2 diabetes mellitus with ketoacidosis without coma: Secondary | ICD-10-CM | POA: Diagnosis present

## 2023-06-08 DIAGNOSIS — D259 Leiomyoma of uterus, unspecified: Secondary | ICD-10-CM | POA: Diagnosis present

## 2023-06-08 DIAGNOSIS — K3189 Other diseases of stomach and duodenum: Secondary | ICD-10-CM | POA: Diagnosis not present

## 2023-06-08 DIAGNOSIS — R935 Abnormal findings on diagnostic imaging of other abdominal regions, including retroperitoneum: Secondary | ICD-10-CM | POA: Diagnosis not present

## 2023-06-08 DIAGNOSIS — R739 Hyperglycemia, unspecified: Secondary | ICD-10-CM | POA: Diagnosis not present

## 2023-06-08 DIAGNOSIS — E8809 Other disorders of plasma-protein metabolism, not elsewhere classified: Secondary | ICD-10-CM | POA: Diagnosis present

## 2023-06-08 DIAGNOSIS — K839 Disease of biliary tract, unspecified: Secondary | ICD-10-CM | POA: Diagnosis not present

## 2023-06-08 DIAGNOSIS — R4182 Altered mental status, unspecified: Secondary | ICD-10-CM | POA: Diagnosis present

## 2023-06-08 DIAGNOSIS — K760 Fatty (change of) liver, not elsewhere classified: Secondary | ICD-10-CM | POA: Diagnosis present

## 2023-06-08 DIAGNOSIS — R410 Disorientation, unspecified: Secondary | ICD-10-CM | POA: Diagnosis not present

## 2023-06-08 DIAGNOSIS — K838 Other specified diseases of biliary tract: Secondary | ICD-10-CM | POA: Diagnosis not present

## 2023-06-08 HISTORY — DX: Nausea with vomiting, unspecified: R11.2

## 2023-06-08 LAB — ECHOCARDIOGRAM COMPLETE
AR max vel: 2.85 cm2
AV Area VTI: 2.27 cm2
AV Area mean vel: 2.61 cm2
AV Mean grad: 7 mmHg
AV Peak grad: 13 mmHg
Ao pk vel: 1.8 m/s
Area-P 1/2: 4.49 cm2
Calc EF: 81.3 %
S' Lateral: 2 cm
Single Plane A2C EF: 84.1 %
Single Plane A4C EF: 75.7 %

## 2023-06-08 LAB — COMPREHENSIVE METABOLIC PANEL
ALT: 528 U/L — ABNORMAL HIGH (ref 0–44)
AST: 81 U/L — ABNORMAL HIGH (ref 15–41)
Albumin: 2 g/dL — ABNORMAL LOW (ref 3.5–5.0)
Alkaline Phosphatase: 320 U/L — ABNORMAL HIGH (ref 38–126)
Anion gap: 14 (ref 5–15)
BUN: 29 mg/dL — ABNORMAL HIGH (ref 6–20)
CO2: 21 mmol/L — ABNORMAL LOW (ref 22–32)
Calcium: 8.6 mg/dL — ABNORMAL LOW (ref 8.9–10.3)
Chloride: 102 mmol/L (ref 98–111)
Creatinine, Ser: 1.03 mg/dL — ABNORMAL HIGH (ref 0.44–1.00)
GFR, Estimated: >60 mL/min (ref >60)
Glucose, Bld: 369 mg/dL — ABNORMAL HIGH (ref 70–99)
Potassium: 4.1 mmol/L (ref 3.5–5.1)
Sodium: 137 mmol/L (ref 135–145)
Total Bilirubin: 1 mg/dL (ref 0.3–1.2)
Total Protein: 6.6 g/dL (ref 6.5–8.1)

## 2023-06-08 LAB — GLUCOSE, CAPILLARY
Glucose-Capillary: 247 mg/dL — ABNORMAL HIGH (ref 70–99)
Glucose-Capillary: 282 mg/dL — ABNORMAL HIGH (ref 70–99)
Glucose-Capillary: 318 mg/dL — ABNORMAL HIGH (ref 70–99)
Glucose-Capillary: 323 mg/dL — ABNORMAL HIGH (ref 70–99)
Glucose-Capillary: 337 mg/dL — ABNORMAL HIGH (ref 70–99)
Glucose-Capillary: 362 mg/dL — ABNORMAL HIGH (ref 70–99)
Glucose-Capillary: 375 mg/dL — ABNORMAL HIGH (ref 70–99)

## 2023-06-08 LAB — HEPATITIS PANEL, ACUTE
HCV Ab: NONREACTIVE
Hep A IgM: NONREACTIVE
Hep B C IgM: NONREACTIVE
Hepatitis B Surface Ag: NONREACTIVE

## 2023-06-08 LAB — ACETAMINOPHEN LEVEL: Acetaminophen (Tylenol), Serum: 35 ug/mL — ABNORMAL HIGH (ref 10–30)

## 2023-06-08 LAB — AMMONIA: Ammonia: 22 umol/L (ref 9–35)

## 2023-06-08 MED ORDER — INSULIN GLARGINE-YFGN 100 UNIT/ML ~~LOC~~ SOLN
26.0000 [IU] | Freq: Every day | SUBCUTANEOUS | Status: DC
  Filled 2023-06-08: qty 0.26

## 2023-06-08 MED ORDER — THIAMINE MONONITRATE 100 MG PO TABS
100.0000 mg | ORAL_TABLET | Freq: Every day | ORAL | Status: DC
  Administered 2023-06-08 – 2023-06-15 (×8): 100 mg via ORAL
  Filled 2023-06-08 (×8): qty 1

## 2023-06-08 MED ORDER — THIAMINE HCL 100 MG/ML IJ SOLN: 100.0000 mg | Freq: Every day | INTRAMUSCULAR | Status: DC

## 2023-06-08 MED ORDER — SODIUM CHLORIDE 0.9 % IV SOLN: INTRAVENOUS | Status: AC

## 2023-06-08 MED ORDER — IOHEXOL 350 MG/ML SOLN
75.0000 mL | Freq: Once | INTRAVENOUS | Status: AC | PRN
  Administered 2023-06-08: 75 mL via INTRAVENOUS

## 2023-06-08 NOTE — Progress Notes (Signed)
  Echocardiogram 2D Echocardiogram has been performed.  Janalyn Harder 06/08/2023, 1:37 PM

## 2023-06-08 NOTE — Progress Notes (Signed)
VASCULAR LAB    Bilateral lower extremity venous duplex has been performed.  See CV proc for preliminary results.   Shalyn Koral, RVT 06/08/2023, 9:52 AM

## 2023-06-08 NOTE — TOC CM/SW Note (Signed)
Transition of Care [facility]) - Inpatient Brief Assessment   Patient Details  Name: Frances Rodgers MRN: [id number] Date of Birth: 1971-11-05  [facility] CM/SW Contact:    Tom-Johnson, Hershal Coria, RN Phone Number: 06/08/2023, 5:19 PM   Clinical Narrative:  Patient presented to the ED with AMS ans CBG >500.  CM went to assess patient at bedside and patient declined stating for CM to come back another time.   CM will assess when patient ready.    Transition of Care Asessment:

## 2023-06-08 NOTE — Progress Notes (Signed)
MEWS Progress Note  Patient Details Name: Frances Rodgers MRN: [id number] DOB: 1971/07/17 Today's Date: 06/08/2023   MEWS Flowsheet Documentation:  Assess: MEWS Score Temp: 98.2 F (36.8 C) BP: 132/65 MAP (mmHg): 80 Pulse Rate: (!) 118 ECG Heart Rate: (!) 107 Resp: 18 Level of Consciousness: Alert SpO2: 100 % Assess: MEWS Score MEWS Temp: 0 MEWS Systolic: 0 MEWS Pulse: 2 MEWS RR: 0 MEWS LOC: 0 MEWS Score: 2 MEWS Score Color: Yellow Assess: SIRS CRITERIA SIRS Temperature : 0 SIRS Respirations : 0 SIRS Pulse: 1 SIRS WBC: 0 SIRS Score Sum : 1 Assess: if the MEWS score is Yellow or Red Were vital signs accurate and taken at a resting state?: Yes Does the patient meet 2 or more of the SIRS criteria?: No MEWS guidelines implemented : Yes, yellow Treat MEWS Interventions: Considered administering scheduled or prn medications/treatments as ordered Take Vital Signs Increase Vital Sign Frequency : Yellow: Q2hr x1, continue Q4hrs until patient remains green for 12hrs Escalate MEWS: Escalate: Yellow: Discuss with charge nurse and consider notifying provider and/or RRT        Denton Meek 06/08/2023, 6:25 AM

## 2023-06-08 NOTE — Progress Notes (Signed)
PROGRESS NOTE    Frances Rodgers  NWG:[id number] DOB: 1971-10-03 DOA: 06/07/2023 PCP: Tollie Eth, NP    Brief Narrative:   Frances Rodgers is a 52 y.o. female with medical history significant of mild intermittent asthma,, recently diagnosed new onset of IDDM brought in by family member for evaluation of mentation changes.   Assessment and Plan:  Acute metabolic encephalopathy -Probably secondary to profound acid-base abnormalities with combined acute metabolic acidosis and respiratory alkalosis -UDS negative, and patient was treated for UTI recently, UA showed no signs of infection, chest x-ray showed no acute infiltrates, will monitor off antibiotics. -Thyroid function was checked recently -delirium precautions -monitor mental status   Acute transaminitis -Unknown etiology, ultrasound showed no obstructive gallstones without signs of acute cholecystitis, and bilirubin level within normal limits -no acute findings on CT scan -viral hepatitis panel pending   AKI -Likely secondary to poor oral intake and dehydration -IVF   IDDM with starvation ketoacidosis -Continue Lantus 22 units daily and NovoLog 6 units -SSI   Peripheral edema -DVT negative -echo pending   Chronic, mild intermittent asthma -No acute concern, continue ICS and as needed albuterol  Obesity Estimated body mass index is 37.42 kg/m as calculated from the following:   Height as of 05/27/23: 5\' 5"  (1.651 m).   Weight as of 05/27/23: 102 kg.    DVT prophylaxis: enoxaparin (LOVENOX) injection 40 mg Start: 06/07/23 2000    Code Status: Full Code Family Communication: called daughter-- sent to voicemail  Disposition Plan:  Level of care: Telemetry Medical Status is: Observation The patient will require care spanning > 2 midnights and should be moved to inpatient    Consultants:  none   Subjective: Continues to be confused on place and time and situation  Objective: Vitals:   06/07/23 2301  06/08/23 0500 06/08/23 0833 06/08/23 0844  BP: (!) 123/34 132/65 139/65   Pulse: (!) 113 (!) 118 (!) 118   Resp: 18 18 16    Temp: 97.7 F (36.5 C) 98.2 F (36.8 C) 98 F (36.7 C)   TempSrc:   Oral   SpO2: 100% 100% 100% 100%    Intake/Output Summary (Last 24 hours) at 06/08/2023 1104 Last data filed at 06/08/2023 0857 Gross per 24 hour  Intake 440 ml  Output 0 ml  Net 440 ml   There were no vitals filed for this visit.  Examination:   General: Appearance:    Obese female in no acute distress     Lungs:     respirations unlabored  Heart:    Tachycardic.     MS:   All extremities are intact.    Neurologic:   Awake, alert       Data Reviewed: I have personally reviewed following labs and imaging studies  CBC: Recent Labs  Lab 06/07/23 1152 06/07/23 1158  WBC 14.4*  --   NEUTROABS 11.7*  --   HGB 10.8* 12.6  HCT 36.1 37.0  MCV 77.5*  --   PLT 397  --    Basic Metabolic Panel: Recent Labs  Lab 06/07/23 1152 06/07/23 1158  NA 134* 134*  K 4.1 4.1  CL 99  --   CO2 18*  --   GLUCOSE 443*  --   BUN 57*  --   CREATININE 1.33*  --   CALCIUM 8.7*  --    GFR: Estimated Creatinine Clearance: 59.9 mL/min (A) (by C-G formula based on SCr of 1.33 mg/dL (H)). Liver Function Tests: Recent  Labs  Lab 06/07/23 1152  AST 168*  ALT 917*  ALKPHOS 419*  BILITOT 1.2  PROT 6.9  ALBUMIN 2.1*   Recent Labs  Lab 06/07/23 2150  LIPASE 141*   Recent Labs  Lab 06/07/23 1133  AMMONIA 39*   Coagulation Profile: Recent Labs  Lab 06/07/23 2150  INR 1.4*   Cardiac Enzymes: No results for input(s): "CKTOTAL", "CKMB", "CKMBINDEX", "TROPONINI" in the last 168 hours. BNP (last 3 results) No results for input(s): "PROBNP" in the last 8760 hours. HbA1C: No results for input(s): "HGBA1C" in the last 72 hours. CBG: Recent Labs  Lab 06/07/23 2301 06/07/23 2359 06/08/23 0058 06/08/23 0514 06/08/23 0716  GLUCAP 373* 375* 362* 337* 282*   Lipid Profile: Recent  Labs    06/07/23 2151  CHOL 81  HDL <10*  LDLCALC NOT CALCULATED  TRIG 191*  CHOLHDL NOT CALCULATED   Thyroid Function Tests: No results for input(s): "TSH", "T4TOTAL", "FREET4", "T3FREE", "THYROIDAB" in the last 72 hours. Anemia Panel: No results for input(s): "VITAMINB12", "FOLATE", "FERRITIN", "TIBC", "IRON", "RETICCTPCT" in the last 72 hours. Sepsis Labs: Recent Labs  Lab 06/07/23 1158 06/07/23 1445  LATICACIDVEN 2.3* 2.3*    Recent Results (from the past 240 hour(s))  Respiratory (~20 pathogens) panel by PCR     Status: None   Collection Time: 05/30/23 10:55 PM   Specimen: Nasopharyngeal Swab; Respiratory  Result Value Ref Range Status   Adenovirus NOT DETECTED NOT DETECTED Final   Coronavirus 229E NOT DETECTED NOT DETECTED Final    Comment: (NOTE) The Coronavirus on the Respiratory Panel, DOES NOT test for the novel  Coronavirus (2019 nCoV)    Coronavirus HKU1 NOT DETECTED NOT DETECTED Final   Coronavirus NL63 NOT DETECTED NOT DETECTED Final   Coronavirus OC43 NOT DETECTED NOT DETECTED Final   Metapneumovirus NOT DETECTED NOT DETECTED Final   Rhinovirus / Enterovirus NOT DETECTED NOT DETECTED Final   Influenza A NOT DETECTED NOT DETECTED Final   Influenza B NOT DETECTED NOT DETECTED Final   Parainfluenza Virus 1 NOT DETECTED NOT DETECTED Final   Parainfluenza Virus 2 NOT DETECTED NOT DETECTED Final   Parainfluenza Virus 3 NOT DETECTED NOT DETECTED Final   Parainfluenza Virus 4 NOT DETECTED NOT DETECTED Final   Respiratory Syncytial Virus NOT DETECTED NOT DETECTED Final   Bordetella pertussis NOT DETECTED NOT DETECTED Final   Bordetella Parapertussis NOT DETECTED NOT DETECTED Final   Chlamydophila pneumoniae NOT DETECTED NOT DETECTED Final   Mycoplasma pneumoniae NOT DETECTED NOT DETECTED Final    Comment: Performed at [facility], [address] [place], [place] [address]  Resp panel by RT-PCR (RSV, Flu A&B, Covid) Anterior Nasal Swab     Status: None    Collection Time: 05/30/23 10:55 PM   Specimen: Anterior Nasal Swab  Result Value Ref Range Status   SARS Coronavirus 2 by RT PCR NEGATIVE NEGATIVE Final    Comment: (NOTE) SARS-CoV-2 target nucleic acids are NOT DETECTED.  The SARS-CoV-2 RNA is generally detectable in upper respiratory specimens during the acute phase of infection. The lowest concentration of SARS-CoV-2 viral copies this assay can detect is 138 copies/mL. A negative result does not preclude SARS-Cov-2 infection and should not be used as the sole basis for treatment or other patient management decisions. A negative result may occur with  improper specimen collection/handling, submission of specimen other than nasopharyngeal swab, presence of viral mutation(s) within the areas targeted by this assay, and inadequate number of viral copies(<138 copies/mL). A  negative result must be combined with clinical observations, patient history, and epidemiological information. The expected result is Negative.  Fact Sheet for Patients:  BloggerCourse.com  Fact Sheet for Healthcare Providers:  SeriousBroker.it  This test is no t yet approved or cleared by the [place] FDA and  has been authorized for detection and/or diagnosis of SARS-CoV-2 by FDA under an Emergency Use Authorization (EUA). This EUA will remain  in effect (meaning this test can be used) for the duration of the COVID-19 declaration under Section 564(b)(1) of the Act, 21 U.S.C.section 360bbb-3(b)(1), unless the authorization is terminated  or revoked sooner.       Influenza A by PCR NEGATIVE NEGATIVE Final   Influenza B by PCR NEGATIVE NEGATIVE Final    Comment: (NOTE) The Xpert Xpress SARS-CoV-2/FLU/RSV plus assay is intended as an aid in the diagnosis of influenza from Nasopharyngeal swab specimens and should not be used as a sole basis for treatment. Nasal washings and aspirates are unacceptable for  Xpert Xpress SARS-CoV-2/FLU/RSV testing.  Fact Sheet for Patients: BloggerCourse.com  Fact Sheet for Healthcare Providers: SeriousBroker.it  This test is not yet approved or cleared by the [place] FDA and has been authorized for detection and/or diagnosis of SARS-CoV-2 by FDA under an Emergency Use Authorization (EUA). This EUA will remain in effect (meaning this test can be used) for the duration of the COVID-19 declaration under Section 564(b)(1) of the Act, 21 U.S.C. section 360bbb-3(b)(1), unless the authorization is terminated or revoked.     Resp Syncytial Virus by PCR NEGATIVE NEGATIVE Final    Comment: (NOTE) Fact Sheet for Patients: BloggerCourse.com  Fact Sheet for Healthcare Providers: SeriousBroker.it  This test is not yet approved or cleared by the [place] FDA and has been authorized for detection and/or diagnosis of SARS-CoV-2 by FDA under an Emergency Use Authorization (EUA). This EUA will remain in effect (meaning this test can be used) for the duration of the COVID-19 declaration under Section 564(b)(1) of the Act, 21 U.S.C. section 360bbb-3(b)(1), unless the authorization is terminated or revoked.  Performed at [facility], [address] [place], [place] [address]          Radiology Studies: VAS [place] LOWER EXTREMITY VENOUS (DVT)  Result Date: 06/08/2023  Lower Venous DVT Study Patient Name:  RAYAH FINES  Date of Exam:   06/08/2023 Medical Rec #: [id number]        Accession #:    [id number] Date of Birth: 01-10-1971        Patient Gender: F Patient Age:   6 years Exam Location:  [facility] Procedure:      VAS [place] LOWER EXTREMITY VENOUS (DVT) Referring Phys: Mikey College --------------------------------------------------------------------------------  Indications: Bilateral thigh pain.  Comparison Study: Prior negative bilateral  LEV done 05/30/23 Performing Technologist: Sherren Kerns RVS  Examination Guidelines: A complete evaluation includes B-mode imaging, spectral Doppler, color Doppler, and power Doppler as needed of all accessible portions of each vessel. Bilateral testing is considered an integral part of a complete examination. Limited examinations for reoccurring indications may be performed as noted. The reflux portion of the exam is performed with the patient in reverse Trendelenburg.  +---------+---------------+---------+-----------+----------+--------------+ RIGHT    CompressibilityPhasicitySpontaneityPropertiesThrombus Aging +---------+---------------+---------+-----------+----------+--------------+ CFV      Full           Yes      Yes                                 +---------+---------------+---------+-----------+----------+--------------+  SFJ      Full                                                        +---------+---------------+---------+-----------+----------+--------------+ FV Prox  Full                                                        +---------+---------------+---------+-----------+----------+--------------+ FV Mid   Full                                                        +---------+---------------+---------+-----------+----------+--------------+ FV DistalFull                                                        +---------+---------------+---------+-----------+----------+--------------+ PFV      Full                                                        +---------+---------------+---------+-----------+----------+--------------+ POP      Full           Yes      Yes                                 +---------+---------------+---------+-----------+----------+--------------+ PTV      Full                                                        +---------+---------------+---------+-----------+----------+--------------+ PERO     Full                                                         +---------+---------------+---------+-----------+----------+--------------+   +---------+---------------+---------+-----------+----------+--------------+ LEFT     CompressibilityPhasicitySpontaneityPropertiesThrombus Aging +---------+---------------+---------+-----------+----------+--------------+ CFV      Full           Yes      Yes                                 +---------+---------------+---------+-----------+----------+--------------+ SFJ      Full                                                        +---------+---------------+---------+-----------+----------+--------------+  FV Prox  Full                                                        +---------+---------------+---------+-----------+----------+--------------+ FV Mid   Full                                                        +---------+---------------+---------+-----------+----------+--------------+ FV DistalFull                                                        +---------+---------------+---------+-----------+----------+--------------+ PFV      Full                                                        +---------+---------------+---------+-----------+----------+--------------+ POP      Full           Yes      Yes                                 +---------+---------------+---------+-----------+----------+--------------+ PTV      Full                                                        +---------+---------------+---------+-----------+----------+--------------+ PERO     Full                                                        +---------+---------------+---------+-----------+----------+--------------+     Summary: BILATERAL: - No evidence of deep vein thrombosis seen in the lower extremities, bilaterally. -No evidence of popliteal cyst, bilaterally. RIGHT: - Findings appear essentially unchanged compared to previous  examination.  LEFT: - Findings appear essentially unchanged compared to previous examination.  *See table(s) above for measurements and observations.    Preliminary    CT ABDOMEN PELVIS W WO CONTRAST  Result Date: 06/08/2023 CLINICAL DATA:  Abdominal pain, acute, nonlocalized EXAM: CT ABDOMEN AND PELVIS WITHOUT AND WITH CONTRAST TECHNIQUE: Multidetector CT imaging of the abdomen and pelvis was performed following the standard protocol before and following the bolus administration of intravenous contrast. RADIATION DOSE REDUCTION: This exam was performed according to the departmental dose-optimization program which includes automated exposure control, adjustment of the mA and/or kV according to patient size and/or use of iterative reconstruction technique. CONTRAST:  75 mL Omnipaque 350 IV COMPARISON:  Right upper quadrant ultrasound 05/18/2023. FINDINGS: Lower chest: No acute abnormality Hepatobiliary: Gallstones seen on earlier ultrasound not appreciated by CT.  No biliary ductal dilatation. Mild diffuse fatty infiltration of the liver. Pancreas: No focal abnormality or ductal dilatation. Spleen: No focal abnormality.  Normal size. Adrenals/Urinary Tract: No adrenal abnormality. No focal renal abnormality. No stones or hydronephrosis. Urinary bladder is unremarkable. Stomach/Bowel: Stomach, large and small bowel grossly unremarkable. Prior gastric sleeve. Vascular/Lymphatic: No evidence of aneurysm or adenopathy. Reproductive: Enlarged uterus with numerous fibroids. Pedunculated fundal fibroid measures up to 6.3 cm. No adnexal mass. Other: No free fluid or free air. Musculoskeletal: No acute bony abnormality. IMPRESSION: Previously seen gallstones on ultrasound not visible by CT. Hepatic steatosis. Enlarged fibroid uterus. No acute findings. Electronically Signed   By: Charlett Nose M.D.   On: 06/08/2023 02:53   US Abdomen Limited RUQ (LIVER/GB)  Result Date: 06/07/2023 CLINICAL DATA:  [id number] AMS (altered  mental status) [id number] Transaminitis [id number] EXAM: ULTRASOUND ABDOMEN LIMITED RIGHT UPPER QUADRANT COMPARISON:  None Available. FINDINGS: Gallbladder: Small volume layering gallstones/sludge noted. No abnormal wall thickening or pericholecystic fluid. Sonographic Murphy's sign could not be evaluated due to unresponsive patient. Common bile duct: Diameter: Less than 4 mm. No intra or extrahepatic bile duct dilation. Liver: There is increased hepatic echogenicity which reduces the sensitivity of ultrasound for the detection of focal masses. That being said, no focal mass is identified. Portal vein is patent on color Doppler imaging with normal direction of blood flow towards the liver. Other: None. IMPRESSION: 1. Cholelithiasis without sonographic evidence of acute cholecystitis. 2. Increased hepatic echogenicity, a nonspecific finding that is most commonly seen on the basis of steatosis in the absence of known liver disease. Electronically Signed   By: Jules Schick M.D.   On: 06/07/2023 15:30   CT Head Wo Contrast  Result Date: 06/07/2023 CLINICAL DATA:  Mental status change, unknown cause EXAM: CT HEAD WITHOUT CONTRAST TECHNIQUE: Contiguous axial images were obtained from the base of the skull through the vertex without intravenous contrast. RADIATION DOSE REDUCTION: This exam was performed according to the departmental dose-optimization program which includes automated exposure control, adjustment of the mA and/or kV according to patient size and/or use of iterative reconstruction technique. COMPARISON:  05/27/2023 FINDINGS: Brain: No evidence of acute infarction, hemorrhage, hydrocephalus, extra-axial collection or mass lesion/mass effect. Vascular: No hyperdense vessel or unexpected calcification. Skull: Normal. Negative for fracture or focal lesion. Sinuses/Orbits: No acute finding. Other: None. IMPRESSION: No acute intracranial findings. Electronically Signed   By: Duanne Guess D.O.   On:  06/07/2023 12:44   DG Chest Portable 1 View  Result Date: 06/07/2023 CLINICAL DATA:  Altered mental status EXAM: PORTABLE CHEST 1 VIEW COMPARISON:  05/28/2023 FINDINGS: The heart size and mediastinal contours are within normal limits. Slightly low lung volumes. No focal airspace consolidation, pleural effusion, or pneumothorax. The visualized skeletal structures are unremarkable. IMPRESSION: No active disease. Electronically Signed   By: Duanne Guess D.O.   On: 06/07/2023 12:41        Scheduled Meds:  budesonide (PULMICORT) nebulizer solution  0.25 mg Nebulization BID   enoxaparin (LOVENOX) injection  40 mg Subcutaneous Q24H   insulin aspart  0-15 Units Subcutaneous TID WC   insulin aspart  0-5 Units Subcutaneous QHS   insulin aspart  6 Units Subcutaneous TID WC   insulin glargine-yfgn  22 Units Subcutaneous Daily   loratadine  10 mg Oral Daily   montelukast  10 mg Oral QHS   pantoprazole  40 mg Oral Daily   thiamine  100 mg Oral Daily   Continuous Infusions:  sodium chloride  Stopped (06/08/23 1039)     LOS: 0 days    Time spent: 4 minutes spent on chart review, discussion with nursing staff, consultants, updating family and interview/physical exam; more than 50% of that time was spent in counseling and/or coordination of care.    Joseph Art, DO [facility] Available via Epic secure chat 7am-7pm After these hours, please refer to coverage provider listed on [email or web address] 06/08/2023, 11:04 AM

## 2023-06-08 NOTE — Progress Notes (Signed)
Patient was found to have home medications in bag in room at bedside. Patient was observed taking what appeared to be tylenol from home. Home meds collected and Marlin Canary DO made aware.

## 2023-06-09 DIAGNOSIS — R7401 Elevation of levels of liver transaminase levels: Secondary | ICD-10-CM | POA: Diagnosis not present

## 2023-06-09 DIAGNOSIS — R739 Hyperglycemia, unspecified: Secondary | ICD-10-CM

## 2023-06-09 LAB — GLUCOSE, CAPILLARY
Glucose-Capillary: 373 mg/dL — ABNORMAL HIGH (ref 70–99)
Glucose-Capillary: 303 mg/dL — ABNORMAL HIGH (ref 70–99)
Glucose-Capillary: 228 mg/dL — ABNORMAL HIGH (ref 70–99)
Glucose-Capillary: 200 mg/dL — ABNORMAL HIGH (ref 70–99)

## 2023-06-09 MED ORDER — ALUM & MAG HYDROXIDE-SIMETH 200-200-20 MG/5ML PO SUSP
30.0000 mL | ORAL | Status: DC | PRN
  Administered 2023-06-10: 30 mL via ORAL
  Filled 2023-06-09 (×2): qty 30

## 2023-06-09 MED ORDER — FUROSEMIDE 10 MG/ML IJ SOLN
40.0000 mg | Freq: Every day | INTRAMUSCULAR | Status: DC
  Administered 2023-06-09 – 2023-06-10 (×2): 40 mg via INTRAVENOUS
  Filled 2023-06-09 (×2): qty 4

## 2023-06-09 MED ORDER — INSULIN ASPART 100 UNIT/ML IJ SOLN
10.0000 [IU] | Freq: Three times a day (TID) | INTRAMUSCULAR | Status: DC
  Administered 2023-06-09 – 2023-06-10 (×4): 10 [IU] via SUBCUTANEOUS

## 2023-06-09 MED ORDER — INSULIN GLARGINE-YFGN 100 UNIT/ML ~~LOC~~ SOLN
30.0000 [IU] | Freq: Every day | SUBCUTANEOUS | Status: DC
  Administered 2023-06-09: 30 [IU] via SUBCUTANEOUS
  Filled 2023-06-09 (×2): qty 0.3

## 2023-06-09 NOTE — Evaluation (Signed)
Physical Therapy Evaluation Patient Details Name: Frances Rodgers MRN: [id number] DOB: [birth year] Today's Date: 06/09/2023  History of Present Illness  Pt is 52 yo female admitted on 06/07/23 with acute metabolic encephalopathy likely secondary to profound acid-base abnormalities.  Pt with hx including but not limited to asthma and new onset IDDM.  Clinical Impression  Pt admitted with above diagnosis. At baseline, pt is independent, lives alone, and works from home.  She does live in 2nd floor condo.  Today, pt requiring min guard to min A for mobility.  She demonstrated mild instability upon standing and was able to ambulate 10' and 20'.  She does need UE support.  Pt did express that she was mildly lightheaded in standing - noted BP had been soft and attempted to get in standing but unable due to painful BP cuff (notified RN).  Pt admitted with AMS and does still seem altered from baseline (laying in wet bed and did not call out, flat affect).  Pt expected to progress well as AMS clears and likely no PT needs at d/c.  Pt currently with functional limitations due to the deficits listed below (see PT Problem List). Pt will benefit from acute skilled PT to increase their independence and safety with mobility to allow discharge.           Assistance Recommended at Discharge Intermittent Supervision/Assistance  If plan is discharge home, recommend the following:  Can travel by private vehicle  A little help with walking and/or transfers;A little help with bathing/dressing/bathroom;Help with stairs or ramp for entrance;Assistance with cooking/housework        Equipment Recommendations None recommended by PT  Recommendations for Other Services       Functional Status Assessment Patient has had a recent decline in their functional status and demonstrates the ability to make significant improvements in function in a reasonable and predictable amount of time.     Precautions / Restrictions  Precautions Precautions: Fall      Mobility  Bed Mobility Overal bed mobility: Needs Assistance Bed Mobility: Supine to Sit     Supine to sit: Supervision, HOB elevated     General bed mobility comments: Use of rails, encouraged to do what she could on her own to gain strength    Transfers Overall transfer level: Needs assistance Equipment used: None Transfers: Sit to/from Stand Sit to Stand: Min assist, Min guard           General transfer comment: Sit to stand x 3 during session.  First attempt with min A to steady but advanced to min guard.  Pt using toilet - min guard for balance.  Assisted pt in cleaning and changing gowns, positioned in chair, RN into change bed.    Ambulation/Gait Ambulation/Gait assistance: Min guard, Min assist Gait Distance (Feet): 15 Feet (10' then 20') Assistive device: None Gait Pattern/deviations: Step-to pattern, Decreased stride length Gait velocity: decreased     General Gait Details: Pt initially unsteady requiring min A for balance then progressed to min guard.  Pt ambulating to bathroom, out to sink, and around to chair.  In small room 5M17 making RW difficult to navigate so using HHA or bed rail. Recommended using RW for hallway ambulation with nursing  Stairs            Wheelchair Mobility     Tilt Bed    Modified Rankin (Stroke Patients Only)       Balance Overall balance assessment: Needs assistance Sitting-balance support: No upper  extremity supported Sitting balance-Leahy Scale: Good     Standing balance support: No upper extremity supported Standing balance-Leahy Scale: Fair Standing balance comment: Pt had some c/o lightheadedness with standing and required min A initially                             Pertinent Vitals/Pain Pain Assessment Pain Assessment: No/denies pain Pain Score: 5  Pain Location: bil feet Pain Descriptors / Indicators: Tightness Pain Intervention(s): Monitored during  session, Limited activity within patient's tolerance    Home Living Family/patient expects to be discharged to:: Private residence Living Arrangements: Alone Available Help at Discharge: Family;Available PRN/intermittently Type of Home: Other(Comment) (condo) Home Access: Stairs to enter Entrance Stairs-Rails: Right;Left;Can reach both Entrance Stairs-Number of Steps: flight   Home Layout: One level Home Equipment: Agricultural consultant (2 wheels);Cane - single point      Prior Function Prior Level of Function : Independent/Modified Independent;Working/employed;Driving                     Hand Dominance        Extremity/Trunk Assessment   Upper Extremity Assessment Upper Extremity Assessment: Overall WFL for tasks assessed    Lower Extremity Assessment Lower Extremity Assessment: Overall WFL for tasks assessed    Cervical / Trunk Assessment Cervical / Trunk Assessment: Normal  Communication      Cognition Arousal/Alertness: Awake/alert Behavior During Therapy: Flat affect Overall Cognitive Status: Impaired/Different from baseline Area of Impairment: Problem solving, Awareness, Safety/judgement, Attention                   Current Attention Level: Selective     Safety/Judgement: Decreased awareness of deficits Awareness: Emergent Problem Solving: Slow processing General Comments: Pt with flat affect.  Oriented x 2. Follows basic commands with increased time.  Pt was in bed -soiled with urine and had not called out for assist        General Comments General comments (skin integrity, edema, etc.): Pt's BP supine prior was 101/65.  She had initial c/o dizziness but then reports lightheaded after using restroom.  Had pt sit in chair.  BP in chair was 93/65, but BP cuff taking significant time to inflate and painful to pt.  Readjusted cuff and attempted BP in standing but again starting to inflate slowly so had pt return to sitting and cuff stopped.  No symptoms  in sitting.  Pt with bariatric cuff.  She reports they use smaller cuff around forearm at [facility]CP-notified RN of pt's preference for forearm BP and potential orthostatic hypotension.    Exercises     Assessment/Plan    PT Assessment Patient needs continued PT services  PT Problem List Decreased strength;Decreased mobility;Decreased safety awareness;Decreased activity tolerance;Decreased balance;Decreased knowledge of use of DME;Decreased range of motion;Cardiopulmonary status limiting activity;Decreased cognition;Decreased knowledge of precautions       PT Treatment Interventions DME instruction;Therapeutic activities;Gait training;Cognitive remediation;Therapeutic exercise;Patient/family education;Stair training;Balance training;Functional mobility training;Neuromuscular re-education    PT Goals (Current goals can be found in the Care Plan section)  Acute Rehab PT Goals Patient Stated Goal: return home PT Goal Formulation: With patient Time For Goal Achievement: 06/23/23    Frequency Min 1X/week     Co-evaluation               AM-PAC PT "6 Clicks" Mobility  Outcome Measure Help needed turning from your back to your side while in a flat bed without using bedrails?:  None Help needed moving from lying on your back to sitting on the side of a flat bed without using bedrails?: A Little Help needed moving to and from a bed to a chair (including a wheelchair)?: A Little Help needed standing up from a chair using your arms (e.g., wheelchair or bedside chair)?: A Little Help needed to walk in hospital room?: A Little Help needed climbing 3-5 steps with a railing? : A Lot 6 Click Score: 18    End of Session Equipment Utilized During Treatment: Gait belt Activity Tolerance: Patient tolerated treatment well Patient left: in chair;with call bell/phone within reach;with chair alarm set Nurse Communication: Mobility status PT Visit Diagnosis: Other abnormalities of gait and mobility  (R26.89);Unsteadiness on feet (R26.81)    Time: 1533-1600 PT Time Calculation (min) (ACUTE ONLY): 27 min   Charges:   PT Evaluation $PT Eval Low Complexity: 1 Low PT Treatments $Therapeutic Activity: 8-22 mins PT General Charges $$ ACUTE PT VISIT: 1 Visit         Anise Salvo, PT Acute Rehab [facility] [phone]   Rayetta Humphrey 06/09/2023, 4:47 PM

## 2023-06-09 NOTE — Progress Notes (Signed)
PROGRESS NOTE    KEMI HANLEY  JXB:[id number] DOB: 02-07-1971 DOA: 06/07/2023 PCP: Tollie Eth, NP    Brief Narrative:   Frances Rodgers is a 52 y.o. female with medical history significant of mild intermittent asthma,, recently diagnosed new onset of IDDM brought in by family member for evaluation of mentation changes.   Assessment and Plan:  Acute metabolic encephalopathy -Probably secondary to profound acid-base abnormalities with combined acute metabolic acidosis and respiratory alkalosis -UDS negative, and patient was treated for UTI recently, UA showed no signs of infection, chest x-ray showed no acute infiltrates, will monitor off antibiotics. -Thyroid function was checked recently -delirium precautions -monitor mental status- appears improved on 7/26   Acute transaminitis -Unknown etiology, ultrasound showed no obstructive gallstones without signs of acute cholecystitis, and bilirubin level within normal limits -no acute findings on CT scan -viral hepatitis panel negative -avoid tylenol   AKI -Likely secondary to poor oral intake and dehydration   IDDM with starvation ketoacidosis -Continue Lantus 22 units daily and NovoLog 6 units -SSI   Peripheral edema -DVT negative -echo done -trial of IV lasix   Chronic, mild intermittent asthma -No acute concern, continue ICS and as needed albuterol  Obesity Estimated body mass index is 37.42 kg/m as calculated from the following:   Height as of 05/27/23: 5\' 5"  (1.651 m).   Weight as of 05/27/23: 102 kg.    DVT prophylaxis: enoxaparin (LOVENOX) injection 40 mg Start: 06/07/23 2000    Code Status: Full Code Family Communication: called daughter-- sent to voicemail  Disposition Plan:  Level of care: Telemetry Medical Status is: inpt    Consultants:  none   Subjective: C/o lower extremity edema  Objective: Vitals:   06/08/23 2020 06/09/23 0613 06/09/23 0816 06/09/23 0833  BP: 110/66 122/81  132/75   Pulse: (!) 110 (!) 105  (!) 102  Resp: 18 20  17   Temp: 97.8 F (36.6 C) 98.4 F (36.9 C)  98.2 F (36.8 C)  TempSrc:      SpO2: 99% 94% 97% 100%    Intake/Output Summary (Last 24 hours) at 06/09/2023 1322 Last data filed at 06/09/2023 1300 Gross per 24 hour  Intake 600 ml  Output 1700 ml  Net -1100 ml   There were no vitals filed for this visit.  Examination:   General: Appearance:    Obese female in no acute distress     Lungs:     respirations unlabored  Heart:    Tachycardic.  + edema   MS:   All extremities are intact.    Neurologic:   Awake, alert       Data Reviewed: I have personally reviewed following labs and imaging studies  CBC: Recent Labs  Lab 06/07/23 1152 06/07/23 1158 06/09/23 0242  WBC 14.4*  --  11.1*  NEUTROABS 11.7*  --   --   HGB 10.8* 12.6 10.6*  HCT 36.1 37.0 34.7*  MCV 77.5*  --  76.6*  PLT 397  --  274   Basic Metabolic Panel: Recent Labs  Lab 06/07/23 1152 06/07/23 1158 06/08/23 1122 06/09/23 0242  NA 134* 134* 137 134*  K 4.1 4.1 4.1 3.7  CL 99  --  102 102  CO2 18*  --  21* 24  GLUCOSE 443*  --  369* 328*  BUN 57*  --  29* 20  CREATININE 1.33*  --  1.03* 0.87  CALCIUM 8.7*  --  8.6* 8.7*   GFR: Estimated Creatinine  Clearance: 91.6 mL/min (by C-G formula based on SCr of 0.87 mg/dL). Liver Function Tests: Recent Labs  Lab 06/07/23 1152 06/08/23 1122 06/09/23 0242  AST 168* 81* 75*  ALT 917* 528* 417*  ALKPHOS 419* 320* 299*  BILITOT 1.2 1.0 0.9  PROT 6.9 6.6 6.7  ALBUMIN 2.1* 2.0* 2.0*   Recent Labs  Lab 06/07/23 2150  LIPASE 141*   Recent Labs  Lab 06/07/23 1133 06/08/23 1855  AMMONIA 39* 22   Coagulation Profile: Recent Labs  Lab 06/07/23 2150  INR 1.4*   Cardiac Enzymes: No results for input(s): "CKTOTAL", "CKMB", "CKMBINDEX", "TROPONINI" in the last 168 hours. BNP (last 3 results) No results for input(s): "PROBNP" in the last 8760 hours. HbA1C: No results for input(s): "HGBA1C" in the  last 72 hours. CBG: Recent Labs  Lab 06/08/23 1127 06/08/23 1614 06/08/23 2019 06/09/23 0714 06/09/23 1120  GLUCAP 323* 247* 318* 373* 303*   Lipid Profile: Recent Labs    06/07/23 2151  CHOL 81  HDL <10*  LDLCALC NOT CALCULATED  TRIG 191*  CHOLHDL NOT CALCULATED   Thyroid Function Tests: No results for input(s): "TSH", "T4TOTAL", "FREET4", "T3FREE", "THYROIDAB" in the last 72 hours. Anemia Panel: No results for input(s): "VITAMINB12", "FOLATE", "FERRITIN", "TIBC", "IRON", "RETICCTPCT" in the last 72 hours. Sepsis Labs: Recent Labs  Lab 06/07/23 1158 06/07/23 1445 06/09/23 0242  LATICACIDVEN 2.3* 2.3* 1.9    Recent Results (from the past 240 hour(s))  Respiratory (~20 pathogens) panel by PCR     Status: None   Collection Time: 05/30/23 10:55 PM   Specimen: Nasopharyngeal Swab; Respiratory  Result Value Ref Range Status   Adenovirus NOT DETECTED NOT DETECTED Final   Coronavirus 229E NOT DETECTED NOT DETECTED Final    Comment: (NOTE) The Coronavirus on the Respiratory Panel, DOES NOT test for the novel  Coronavirus (2019 nCoV)    Coronavirus HKU1 NOT DETECTED NOT DETECTED Final   Coronavirus NL63 NOT DETECTED NOT DETECTED Final   Coronavirus OC43 NOT DETECTED NOT DETECTED Final   Metapneumovirus NOT DETECTED NOT DETECTED Final   Rhinovirus / Enterovirus NOT DETECTED NOT DETECTED Final   Influenza A NOT DETECTED NOT DETECTED Final   Influenza B NOT DETECTED NOT DETECTED Final   Parainfluenza Virus 1 NOT DETECTED NOT DETECTED Final   Parainfluenza Virus 2 NOT DETECTED NOT DETECTED Final   Parainfluenza Virus 3 NOT DETECTED NOT DETECTED Final   Parainfluenza Virus 4 NOT DETECTED NOT DETECTED Final   Respiratory Syncytial Virus NOT DETECTED NOT DETECTED Final   Bordetella pertussis NOT DETECTED NOT DETECTED Final   Bordetella Parapertussis NOT DETECTED NOT DETECTED Final   Chlamydophila pneumoniae NOT DETECTED NOT DETECTED Final   Mycoplasma pneumoniae NOT  DETECTED NOT DETECTED Final    Comment: Performed at [facility], [address] [place], [place] [address]  Resp panel by RT-PCR (RSV, Flu A&B, Covid) Anterior Nasal Swab     Status: None   Collection Time: 05/30/23 10:55 PM   Specimen: Anterior Nasal Swab  Result Value Ref Range Status   SARS Coronavirus 2 by RT PCR NEGATIVE NEGATIVE Final    Comment: (NOTE) SARS-CoV-2 target nucleic acids are NOT DETECTED.  The SARS-CoV-2 RNA is generally detectable in upper respiratory specimens during the acute phase of infection. The lowest concentration of SARS-CoV-2 viral copies this assay can detect is 138 copies/mL. A negative result does not preclude SARS-Cov-2 infection and should not be used as the sole basis for treatment or other patient  management decisions. A negative result may occur with  improper specimen collection/handling, submission of specimen other than nasopharyngeal swab, presence of viral mutation(s) within the areas targeted by this assay, and inadequate number of viral copies(<138 copies/mL). A negative result must be combined with clinical observations, patient history, and epidemiological information. The expected result is Negative.  Fact Sheet for Patients:  BloggerCourse.com  Fact Sheet for Healthcare Providers:  SeriousBroker.it  This test is no t yet approved or cleared by the [place] FDA and  has been authorized for detection and/or diagnosis of SARS-CoV-2 by FDA under an Emergency Use Authorization (EUA). This EUA will remain  in effect (meaning this test can be used) for the duration of the COVID-19 declaration under Section 564(b)(1) of the Act, 21 U.S.C.section 360bbb-3(b)(1), unless the authorization is terminated  or revoked sooner.       Influenza A by PCR NEGATIVE NEGATIVE Final   Influenza B by PCR NEGATIVE NEGATIVE Final    Comment: (NOTE) The Xpert Xpress SARS-CoV-2/FLU/RSV plus  assay is intended as an aid in the diagnosis of influenza from Nasopharyngeal swab specimens and should not be used as a sole basis for treatment. Nasal washings and aspirates are unacceptable for Xpert Xpress SARS-CoV-2/FLU/RSV testing.  Fact Sheet for Patients: BloggerCourse.com  Fact Sheet for Healthcare Providers: SeriousBroker.it  This test is not yet approved or cleared by the [place] FDA and has been authorized for detection and/or diagnosis of SARS-CoV-2 by FDA under an Emergency Use Authorization (EUA). This EUA will remain in effect (meaning this test can be used) for the duration of the COVID-19 declaration under Section 564(b)(1) of the Act, 21 U.S.C. section 360bbb-3(b)(1), unless the authorization is terminated or revoked.     Resp Syncytial Virus by PCR NEGATIVE NEGATIVE Final    Comment: (NOTE) Fact Sheet for Patients: BloggerCourse.com  Fact Sheet for Healthcare Providers: SeriousBroker.it  This test is not yet approved or cleared by the [place] FDA and has been authorized for detection and/or diagnosis of SARS-CoV-2 by FDA under an Emergency Use Authorization (EUA). This EUA will remain in effect (meaning this test can be used) for the duration of the COVID-19 declaration under Section 564(b)(1) of the Act, 21 U.S.C. section 360bbb-3(b)(1), unless the authorization is terminated or revoked.  Performed at [facility], [address] [place], [place] [address]          Radiology Studies: VAS [place] LOWER EXTREMITY VENOUS (DVT)  Result Date: 06/08/2023  Lower Venous DVT Study Patient Name:  JESENYA FREDERICO  Date of Exam:   06/08/2023 Medical Rec #: [id number]        Accession #:    [id number] Date of Birth: 02/04/1971        Patient Gender: F Patient Age:   54 years Exam Location:  [facility] Procedure:      VAS [place] LOWER EXTREMITY  VENOUS (DVT) Referring Phys: Mikey College --------------------------------------------------------------------------------  Indications: Bilateral thigh pain.  Comparison Study: Prior negative bilateral LEV done 05/30/23 Performing Technologist: Sherren Kerns RVS  Examination Guidelines: A complete evaluation includes B-mode imaging, spectral Doppler, color Doppler, and power Doppler as needed of all accessible portions of each vessel. Bilateral testing is considered an integral part of a complete examination. Limited examinations for reoccurring indications may be performed as noted. The reflux portion of the exam is performed with the patient in reverse Trendelenburg.  +---------+---------------+---------+-----------+----------+--------------+ RIGHT    CompressibilityPhasicitySpontaneityPropertiesThrombus Aging +---------+---------------+---------+-----------+----------+--------------+ CFV      Full  Yes      Yes                                 +---------+---------------+---------+-----------+----------+--------------+ SFJ      Full                                                        +---------+---------------+---------+-----------+----------+--------------+ FV Prox  Full                                                        +---------+---------------+---------+-----------+----------+--------------+ FV Mid   Full                                                        +---------+---------------+---------+-----------+----------+--------------+ FV DistalFull                                                        +---------+---------------+---------+-----------+----------+--------------+ PFV      Full                                                        +---------+---------------+---------+-----------+----------+--------------+ POP      Full           Yes      Yes                                  +---------+---------------+---------+-----------+----------+--------------+ PTV      Full                                                        +---------+---------------+---------+-----------+----------+--------------+ PERO     Full                                                        +---------+---------------+---------+-----------+----------+--------------+   +---------+---------------+---------+-----------+----------+--------------+ LEFT     CompressibilityPhasicitySpontaneityPropertiesThrombus Aging +---------+---------------+---------+-----------+----------+--------------+ CFV      Full           Yes      Yes                                 +---------+---------------+---------+-----------+----------+--------------+ SFJ  Full                                                        +---------+---------------+---------+-----------+----------+--------------+ FV Prox  Full                                                        +---------+---------------+---------+-----------+----------+--------------+ FV Mid   Full                                                        +---------+---------------+---------+-----------+----------+--------------+ FV DistalFull                                                        +---------+---------------+---------+-----------+----------+--------------+ PFV      Full                                                        +---------+---------------+---------+-----------+----------+--------------+ POP      Full           Yes      Yes                                 +---------+---------------+---------+-----------+----------+--------------+ PTV      Full                                                        +---------+---------------+---------+-----------+----------+--------------+ PERO     Full                                                         +---------+---------------+---------+-----------+----------+--------------+     Summary: BILATERAL: - No evidence of deep vein thrombosis seen in the lower extremities, bilaterally. -No evidence of popliteal cyst, bilaterally. RIGHT: - Findings appear essentially unchanged compared to previous examination.  LEFT: - Findings appear essentially unchanged compared to previous examination.  *See table(s) above for measurements and observations. Electronically signed by Gerarda Fraction on 06/08/2023 at 6:08:10 PM.    Final    ECHOCARDIOGRAM COMPLETE  Result Date: 06/08/2023    ECHOCARDIOGRAM REPORT   Patient Name:   TEJA MORING Date of Exam: 06/08/2023 Medical Rec #:  [id number]       Height:       65.0 in Accession #:  [id number]      Weight:       224.9 lb Date of Birth:  20-Apr-1971       BSA:          2.079 m Patient Age:    51 years        BP:           139/65 mmHg Patient Gender: F               HR:           108 bpm. Exam Location:  Inpatient Procedure: 2D Echo, Cardiac Doppler and Color Doppler Indications:    I50.31 Acute diastolic (congestive) heart failure  History:        Patient has no prior history of Echocardiogram examinations.                 Signs/Symptoms:Bacteremia and Altered Mental Status.  Sonographer:    Sheralyn Boatman RDCS Referring Phys: [phone] Marrianne Sica U [facility]  Sonographer Comments: Technically difficult study due to poor echo windows and patient is obese. Image acquisition challenging due to patient body habitus. IMPRESSIONS  1. Hyperdynamic LV with mild LVOT gradient.. Left ventricular ejection fraction, by estimation, is 70 to 75%. The left ventricle has hyperdynamic function. The left ventricle has no regional wall motion abnormalities. There is mild concentric left ventricular hypertrophy. Left ventricular diastolic parameters are consistent with Grade I diastolic dysfunction (impaired relaxation).  2. Right ventricular systolic function is normal. The right ventricular size is normal.  3. The  mitral valve is normal in structure. Trivial mitral valve regurgitation. No evidence of mitral stenosis.  4. The aortic valve is tricuspid. There is mild calcification of the aortic valve. Aortic valve regurgitation is not visualized. Aortic valve sclerosis/calcification is present, without any evidence of aortic stenosis.  5. The inferior vena cava is normal in size with greater than 50% respiratory variability, suggesting right atrial pressure of 3 mmHg. Conclusion(s)/Recommendation(s): No evidence of valvular vegetations on this transthoracic echocardiogram. Consider a transesophageal echocardiogram to exclude infective endocarditis if clinically indicated. FINDINGS  Left Ventricle: Hyperdynamic LV with mild LVOT gradient. Left ventricular ejection fraction, by estimation, is 70 to 75%. The left ventricle has hyperdynamic function. The left ventricle has no regional wall motion abnormalities. The left ventricular internal cavity size was normal in size. There is mild concentric left ventricular hypertrophy. Left ventricular diastolic parameters are consistent with Grade I diastolic dysfunction (impaired relaxation). Right Ventricle: The right ventricular size is normal. No increase in right ventricular wall thickness. Right ventricular systolic function is normal. Left Atrium: Left atrial size was normal in size. Right Atrium: Right atrial size was normal in size. Pericardium: There is no evidence of pericardial effusion. Mitral Valve: The mitral valve is normal in structure. Trivial mitral valve regurgitation. No evidence of mitral valve stenosis. Tricuspid Valve: The tricuspid valve is normal in structure. Tricuspid valve regurgitation is not demonstrated. No evidence of tricuspid stenosis. Aortic Valve: The aortic valve is tricuspid. There is mild calcification of the aortic valve. Aortic valve regurgitation is not visualized. Aortic valve sclerosis/calcification is present, without any evidence of aortic  stenosis. Aortic valve mean gradient measures 7.0 mmHg. Aortic valve peak gradient measures 13.0 mmHg. Aortic valve area, by VTI measures 2.27 cm. Pulmonic Valve: The pulmonic valve was normal in structure. Pulmonic valve regurgitation is not visualized. No evidence of pulmonic stenosis. Aorta: The aortic root is normal in size and structure. Venous: The inferior vena cava is normal in size  with greater than 50% respiratory variability, suggesting right atrial pressure of 3 mmHg. IAS/Shunts: No atrial level shunt detected by color flow Doppler.  LEFT VENTRICLE PLAX 2D LVIDd:         3.80 cm     Diastology LVIDs:         2.00 cm     LV e' medial:    7.83 cm/s LV PW:         1.40 cm     LV E/e' medial:  9.2 LV IVS:        1.30 cm     LV e' lateral:   5.77 cm/s LVOT diam:     2.10 cm     LV E/e' lateral: 12.5 LV SV:         63 LV SV Index:   30 LVOT Area:     3.46 cm  LV Volumes (MOD) LV vol d, MOD A2C: 45.8 ml LV vol d, MOD A4C: 38.0 ml LV vol s, MOD A2C: 7.3 ml LV vol s, MOD A4C: 9.2 ml LV SV MOD A2C:     38.5 ml LV SV MOD A4C:     38.0 ml LV SV MOD BP:      36.2 ml RIGHT VENTRICLE             IVC RV S prime:     19.40 cm/s  IVC diam: 1.70 cm TAPSE (M-mode): 2.3 cm LEFT ATRIUM             Index        RIGHT ATRIUM          Index LA diam:        2.80 cm 1.35 cm/m   RA Area:     9.40 cm LA Vol (A2C):   23.1 ml 11.11 ml/m  RA Volume:   19.20 ml 9.23 ml/m LA Vol (A4C):   15.2 ml 7.31 ml/m LA Biplane Vol: 18.8 ml 9.04 ml/m  AORTIC VALVE AV Area (Vmax):    2.85 cm AV Area (Vmean):   2.61 cm AV Area (VTI):     2.27 cm AV Vmax:           180.00 cm/s AV Vmean:          124.000 cm/s AV VTI:            0.279 m AV Peak Grad:      13.0 mmHg AV Mean Grad:      7.0 mmHg LVOT Vmax:         148.00 cm/s LVOT Vmean:        93.600 cm/s LVOT VTI:          0.183 m LVOT/AV VTI ratio: 0.66  AORTA Ao Root diam: 2.70 cm Ao Asc diam:  3.10 cm MITRAL VALVE MV Area (PHT): 4.49 cm    SHUNTS MV Decel Time: 169 msec    Systemic VTI:   0.18 m MV E velocity: 72.30 cm/s  Systemic Diam: 2.10 cm MV A velocity: 98.50 cm/s MV E/A ratio:  0.73 Arvilla Meres MD Electronically signed by Arvilla Meres MD Signature Date/Time: 06/08/2023/3:01:11 PM    Final    CT ABDOMEN PELVIS W WO CONTRAST  Result Date: 06/08/2023 CLINICAL DATA:  Abdominal pain, acute, nonlocalized EXAM: CT ABDOMEN AND PELVIS WITHOUT AND WITH CONTRAST TECHNIQUE: Multidetector CT imaging of the abdomen and pelvis was performed following the standard protocol before and following the bolus administration of intravenous contrast. RADIATION DOSE REDUCTION: This exam  was performed according to the departmental dose-optimization program which includes automated exposure control, adjustment of the mA and/or kV according to patient size and/or use of iterative reconstruction technique. CONTRAST:  75 mL Omnipaque 350 IV COMPARISON:  Right upper quadrant ultrasound 05/18/2023. FINDINGS: Lower chest: No acute abnormality Hepatobiliary: Gallstones seen on earlier ultrasound not appreciated by CT. No biliary ductal dilatation. Mild diffuse fatty infiltration of the liver. Pancreas: No focal abnormality or ductal dilatation. Spleen: No focal abnormality.  Normal size. Adrenals/Urinary Tract: No adrenal abnormality. No focal renal abnormality. No stones or hydronephrosis. Urinary bladder is unremarkable. Stomach/Bowel: Stomach, large and small bowel grossly unremarkable. Prior gastric sleeve. Vascular/Lymphatic: No evidence of aneurysm or adenopathy. Reproductive: Enlarged uterus with numerous fibroids. Pedunculated fundal fibroid measures up to 6.3 cm. No adnexal mass. Other: No free fluid or free air. Musculoskeletal: No acute bony abnormality. IMPRESSION: Previously seen gallstones on ultrasound not visible by CT. Hepatic steatosis. Enlarged fibroid uterus. No acute findings. Electronically Signed   By: Charlett Nose M.D.   On: 06/08/2023 02:53   US Abdomen Limited RUQ (LIVER/GB)  Result  Date: 06/07/2023 CLINICAL DATA:  [id number] AMS (altered mental status) [id number] Transaminitis [id number] EXAM: ULTRASOUND ABDOMEN LIMITED RIGHT UPPER QUADRANT COMPARISON:  None Available. FINDINGS: Gallbladder: Small volume layering gallstones/sludge noted. No abnormal wall thickening or pericholecystic fluid. Sonographic Murphy's sign could not be evaluated due to unresponsive patient. Common bile duct: Diameter: Less than 4 mm. No intra or extrahepatic bile duct dilation. Liver: There is increased hepatic echogenicity which reduces the sensitivity of ultrasound for the detection of focal masses. That being said, no focal mass is identified. Portal vein is patent on color Doppler imaging with normal direction of blood flow towards the liver. Other: None. IMPRESSION: 1. Cholelithiasis without sonographic evidence of acute cholecystitis. 2. Increased hepatic echogenicity, a nonspecific finding that is most commonly seen on the basis of steatosis in the absence of known liver disease. Electronically Signed   By: Jules Schick M.D.   On: 06/07/2023 15:30        Scheduled Meds:  budesonide (PULMICORT) nebulizer solution  0.25 mg Nebulization BID   enoxaparin (LOVENOX) injection  40 mg Subcutaneous Q24H   furosemide  40 mg Intravenous Daily   insulin aspart  0-15 Units Subcutaneous TID WC   insulin aspart  0-5 Units Subcutaneous QHS   insulin aspart  10 Units Subcutaneous TID WC   insulin glargine-yfgn  30 Units Subcutaneous Daily   loratadine  10 mg Oral Daily   montelukast  10 mg Oral QHS   pantoprazole  40 mg Oral Daily   thiamine  100 mg Oral Daily   Continuous Infusions:     LOS: 1 day    Time spent: 4 minutes spent on chart review, discussion with nursing staff, consultants, updating family and interview/physical exam; more than 50% of that time was spent in counseling and/or coordination of care.    Joseph Art, DO [facility] Available via Epic secure chat 7am-7pm After  these hours, please refer to coverage provider listed on [email or web address] 06/09/2023, 1:22 PM

## 2023-06-10 DIAGNOSIS — R739 Hyperglycemia, unspecified: Secondary | ICD-10-CM | POA: Diagnosis not present

## 2023-06-10 DIAGNOSIS — R400 Somnolence: Secondary | ICD-10-CM | POA: Diagnosis not present

## 2023-06-10 DIAGNOSIS — R7401 Elevation of levels of liver transaminase levels: Secondary | ICD-10-CM | POA: Diagnosis not present

## 2023-06-10 LAB — GLUCOSE, CAPILLARY
Glucose-Capillary: 209 mg/dL — ABNORMAL HIGH (ref 70–99)
Glucose-Capillary: 93 mg/dL (ref 70–99)
Glucose-Capillary: 185 mg/dL — ABNORMAL HIGH (ref 70–99)
Glucose-Capillary: 136 mg/dL — ABNORMAL HIGH (ref 70–99)
Glucose-Capillary: 185 mg/dL — ABNORMAL HIGH (ref 70–99)

## 2023-06-10 MED ORDER — FAMOTIDINE 20 MG PO TABS
40.0000 mg | ORAL_TABLET | Freq: Every day | ORAL | Status: DC
  Administered 2023-06-10: 40 mg via ORAL
  Filled 2023-06-10 (×7): qty 2

## 2023-06-10 MED ORDER — MIDODRINE HCL 5 MG PO TABS
5.0000 mg | ORAL_TABLET | Freq: Once | ORAL | Status: AC
  Administered 2023-06-10: 5 mg via ORAL
  Filled 2023-06-10: qty 1

## 2023-06-10 MED ORDER — INSULIN ASPART 100 UNIT/ML IJ SOLN
6.0000 [IU] | Freq: Three times a day (TID) | INTRAMUSCULAR | Status: DC
  Administered 2023-06-11 – 2023-06-14 (×9): 6 [IU] via SUBCUTANEOUS

## 2023-06-10 MED ORDER — INSULIN GLARGINE-YFGN 100 UNIT/ML ~~LOC~~ SOLN
35.0000 [IU] | Freq: Every day | SUBCUTANEOUS | Status: DC
  Administered 2023-06-10 – 2023-06-13 (×4): 35 [IU] via SUBCUTANEOUS
  Filled 2023-06-10 (×4): qty 0.35

## 2023-06-10 NOTE — Plan of Care (Signed)
  Problem: Urinary Elimination: Goal: Ability to achieve and maintain adequate renal perfusion and functioning will improve Outcome: Progressing   Problem: Coping: Goal: Ability to adjust to condition or change in health will improve Outcome: Progressing   Problem: Nutritional: Goal: Maintenance of adequate nutrition will improve Outcome: Progressing   Problem: Skin Integrity: Goal: Risk for impaired skin integrity will decrease Outcome: Progressing

## 2023-06-10 NOTE — Plan of Care (Signed)
Problem: Education: Goal: Ability to describe self-care measures that may prevent or decrease complications (Diabetes Survival Skills Education) will improve Outcome: Progressing Goal: Individualized Educational Video(s) Outcome: Progressing   Problem: Cardiac: Goal: Ability to maintain an adequate cardiac output will improve Outcome: Progressing   Problem: Health Behavior/Discharge Planning: Goal: Ability to identify and utilize available resources and services will improve Outcome: Progressing Goal: Ability to manage health-related needs will improve Outcome: Progressing   Problem: Fluid Volume: Goal: Ability to achieve a balanced intake and output will improve Outcome: Progressing   Problem: Metabolic: Goal: Ability to maintain appropriate glucose levels will improve Outcome: Progressing   Problem: Nutritional: Goal: Maintenance of adequate nutrition will improve Outcome: Progressing Goal: Maintenance of adequate weight for body size and type will improve Outcome: Progressing   Problem: Respiratory: Goal: Will regain and/or maintain adequate ventilation Outcome: Progressing   Problem: Urinary Elimination: Goal: Ability to achieve and maintain adequate renal perfusion and functioning will improve Outcome: Progressing   Problem: Education: Goal: Ability to describe self-care measures that may prevent or decrease complications (Diabetes Survival Skills Education) will improve Outcome: Progressing Goal: Individualized Educational Video(s) Outcome: Progressing   Problem: Coping: Goal: Ability to adjust to condition or change in health will improve Outcome: Progressing   Problem: Fluid Volume: Goal: Ability to maintain a balanced intake and output will improve Outcome: Progressing   Problem: Health Behavior/Discharge Planning: Goal: Ability to identify and utilize available resources and services will improve Outcome: Progressing Goal: Ability to manage  health-related needs will improve Outcome: Progressing   Problem: Metabolic: Goal: Ability to maintain appropriate glucose levels will improve Outcome: Progressing   Problem: Nutritional: Goal: Maintenance of adequate nutrition will improve Outcome: Progressing Goal: Progress toward achieving an optimal weight will improve Outcome: Progressing   Problem: Skin Integrity: Goal: Risk for impaired skin integrity will decrease Outcome: Progressing   Problem: Tissue Perfusion: Goal: Adequacy of tissue perfusion will improve Outcome: Progressing   Problem: Education: Goal: Ability to describe self-care measures that may prevent or decrease complications (Diabetes Survival Skills Education) will improve Outcome: Progressing Goal: Individualized Educational Video(s) Outcome: Progressing   Problem: Coping: Goal: Ability to adjust to condition or change in health will improve Outcome: Progressing   Problem: Fluid Volume: Goal: Ability to maintain a balanced intake and output will improve Outcome: Progressing   Problem: Health Behavior/Discharge Planning: Goal: Ability to identify and utilize available resources and services will improve Outcome: Progressing Goal: Ability to manage health-related needs will improve Outcome: Progressing   Problem: Metabolic: Goal: Ability to maintain appropriate glucose levels will improve Outcome: Progressing   Problem: Nutritional: Goal: Maintenance of adequate nutrition will improve Outcome: Progressing Goal: Progress toward achieving an optimal weight will improve Outcome: Progressing   Problem: Skin Integrity: Goal: Risk for impaired skin integrity will decrease Outcome: Progressing   Problem: Tissue Perfusion: Goal: Adequacy of tissue perfusion will improve Outcome: Progressing   Problem: Education: Goal: Knowledge of General Education information will improve Description: Including pain rating scale, medication(s)/side effects  and non-pharmacologic comfort measures Outcome: Progressing   Problem: Health Behavior/Discharge Planning: Goal: Ability to manage health-related needs will improve Outcome: Progressing   Problem: Clinical Measurements: Goal: Ability to maintain clinical measurements within normal limits will improve Outcome: Progressing Goal: Will remain free from infection Outcome: Progressing Goal: Diagnostic test results will improve Outcome: Progressing Goal: Respiratory complications will improve Outcome: Progressing Goal: Cardiovascular complication will be avoided Outcome: Progressing   Problem: Activity: Goal: Risk for activity intolerance will decrease Outcome: Progressing  Problem: Nutrition: Goal: Adequate nutrition will be maintained Outcome: Progressing   Problem: Coping: Goal: Level of anxiety will decrease Outcome: Progressing   Problem: Elimination: Goal: Will not experience complications related to bowel motility Outcome: Progressing Goal: Will not experience complications related to urinary retention Outcome: Progressing

## 2023-06-10 NOTE — Progress Notes (Signed)
PROGRESS NOTE    Frances Rodgers  YNW:[id number] DOB: 09/25/1971 DOA: 06/07/2023 PCP: Tollie Eth, NP    Brief Narrative:   Frances Rodgers is a 52 y.o. female with medical history significant of mild intermittent asthma,, recently diagnosed new onset of IDDM brought in by family member for evaluation of mentation changes.   Assessment and Plan:  Acute metabolic encephalopathy -Probably secondary to profound acid-base abnormalities with combined acute metabolic acidosis and respiratory alkalosis -UDS negative, and patient was treated for UTI recently, UA showed no signs of infection, chest x-ray showed no acute infiltrates, will monitor off antibiotics. -Thyroid function was checked recently -delirium precautions -monitor mental status- appears improved   Acute transaminitis -Unknown etiology, ultrasound showed no obstructive gallstones without signs of acute cholecystitis, and bilirubin level within normal limits -no acute findings on CT scan -viral hepatitis panel negative -avoid tylenol -labs in AM   Leukocytosis -unclear etiology -monitor  AKI -Likely secondary to poor oral intake and dehydration -now appears volume overloaded-- IV lasix   IDDM with starvation ketoacidosis -adjust insulin as able   Peripheral edema -DVT negative -echo done -trial of IV lasix   Chronic, mild intermittent asthma -No acute concern, continue ICS and as needed albuterol  Obesity Estimated body mass index is 37.42 kg/m as calculated from the following:   Height as of 05/27/23: 5\' 5"  (1.651 m).   Weight as of 05/27/23: 102 kg.    DVT prophylaxis: enoxaparin (LOVENOX) injection 40 mg Start: 06/07/23 2000    Code Status: Full Code Family Communication: called daughter-- sent to voicemail  Disposition Plan:  Level of care: Telemetry Medical Status is: inpt    Consultants:  none   Subjective: Swelling better in lower legs  Objective: Vitals:   06/10/23 0849 06/10/23  0931 06/10/23 0937 06/10/23 0939  BP:  (!) 100/59 128/82 126/86  Pulse: (!) 113 (!) 116    Resp: 18 16    Temp:  98 F (36.7 C)    TempSrc:      SpO2: 93% 96%      Intake/Output Summary (Last 24 hours) at 06/10/2023 1217 Last data filed at 06/10/2023 1100 Gross per 24 hour  Intake 680 ml  Output 1100 ml  Net -420 ml   There were no vitals filed for this visit.  Examination:   General: Appearance:    Obese female in no acute distress     Lungs:     respirations unlabored  Heart:    Tachycardic.  + edema   MS:   All extremities are intact.    Neurologic:   Awake, alert       Data Reviewed: I have personally reviewed following labs and imaging studies  CBC: Recent Labs  Lab 06/07/23 1152 06/07/23 1158 06/09/23 0242 06/10/23 0232  WBC 14.4*  --  11.1* 16.0*  NEUTROABS 11.7*  --   --   --   HGB 10.8* 12.6 10.6* 9.5*  HCT 36.1 37.0 34.7* 30.8*  MCV 77.5*  --  76.6* 78.0*  PLT 397  --  274 292   Basic Metabolic Panel: Recent Labs  Lab 06/07/23 1152 06/07/23 1158 06/08/23 1122 06/09/23 0242 06/10/23 0232  NA 134* 134* 137 134* 134*  K 4.1 4.1 4.1 3.7 3.5  CL 99  --  102 102 101  CO2 18*  --  21* 24 22  GLUCOSE 443*  --  369* 328* 189*  BUN 57*  --  29* 20 11  CREATININE  1.33*  --  1.03* 0.87 0.68  CALCIUM 8.7*  --  8.6* 8.7* 8.0*   GFR: Estimated Creatinine Clearance: 99.6 mL/min (by C-G formula based on SCr of 0.68 mg/dL). Liver Function Tests: Recent Labs  Lab 06/07/23 1152 06/08/23 1122 06/09/23 0242  AST 168* 81* 75*  ALT 917* 528* 417*  ALKPHOS 419* 320* 299*  BILITOT 1.2 1.0 0.9  PROT 6.9 6.6 6.7  ALBUMIN 2.1* 2.0* 2.0*   Recent Labs  Lab 06/07/23 2150  LIPASE 141*   Recent Labs  Lab 06/07/23 1133 06/08/23 1855  AMMONIA 39* 22   Coagulation Profile: Recent Labs  Lab 06/07/23 2150  INR 1.4*   Cardiac Enzymes: No results for input(s): "CKTOTAL", "CKMB", "CKMBINDEX", "TROPONINI" in the last 168 hours. BNP (last 3  results) No results for input(s): "PROBNP" in the last 8760 hours. HbA1C: No results for input(s): "HGBA1C" in the last 72 hours. CBG: Recent Labs  Lab 06/09/23 1120 06/09/23 1633 06/09/23 2059 06/10/23 0743 06/10/23 1149  GLUCAP 303* 228* 200* 209* 93   Lipid Profile: Recent Labs    06/07/23 2151  CHOL 81  HDL <10*  LDLCALC NOT CALCULATED  TRIG 191*  CHOLHDL NOT CALCULATED   Thyroid Function Tests: No results for input(s): "TSH", "T4TOTAL", "FREET4", "T3FREE", "THYROIDAB" in the last 72 hours. Anemia Panel: No results for input(s): "VITAMINB12", "FOLATE", "FERRITIN", "TIBC", "IRON", "RETICCTPCT" in the last 72 hours. Sepsis Labs: Recent Labs  Lab 06/07/23 1158 06/07/23 1445 06/09/23 0242  LATICACIDVEN 2.3* 2.3* 1.9    No results found for this or any previous visit (from the past 240 hour(s)).        Radiology Studies: ECHOCARDIOGRAM COMPLETE  Result Date: 06/08/2023    ECHOCARDIOGRAM REPORT   Patient Name:   Frances Rodgers Date of Exam: 06/08/2023 Medical Rec #:  [id number]       Height:       65.0 in Accession #:    [id number]      Weight:       224.9 lb Date of Birth:  [birth year]       BSA:          2.079 m Patient Age:    51 years        BP:           139/65 mmHg Patient Gender: F               HR:           108 bpm. Exam Location:  Inpatient Procedure: 2D Echo, Cardiac Doppler and Color Doppler Indications:    I50.31 Acute diastolic (congestive) heart failure  History:        Patient has no prior history of Echocardiogram examinations.                 Signs/Symptoms:Bacteremia and Altered Mental Status.  Sonographer:    Sheralyn Boatman RDCS Referring Phys: [phone] Elwyn Klosinski U [facility], [place]  Sonographer Comments: Technically difficult study due to poor echo windows and patient is obese. Image acquisition challenging due to patient body habitus. IMPRESSIONS  1. Hyperdynamic LV with mild LVOT gradient.. Left ventricular ejection fraction, by estimation, is 70 to 75%. The left ventricle  has hyperdynamic function. The left ventricle has no regional wall motion abnormalities. There is mild concentric left ventricular hypertrophy. Left ventricular diastolic parameters are consistent with Grade I diastolic dysfunction (impaired relaxation).  2. Right ventricular systolic function is normal. The right ventricular size is normal.  3. The mitral  valve is normal in structure. Trivial mitral valve regurgitation. No evidence of mitral stenosis.  4. The aortic valve is tricuspid. There is mild calcification of the aortic valve. Aortic valve regurgitation is not visualized. Aortic valve sclerosis/calcification is present, without any evidence of aortic stenosis.  5. The inferior vena cava is normal in size with greater than 50% respiratory variability, suggesting right atrial pressure of 3 mmHg. Conclusion(s)/Recommendation(s): No evidence of valvular vegetations on this transthoracic echocardiogram. Consider a transesophageal echocardiogram to exclude infective endocarditis if clinically indicated. FINDINGS  Left Ventricle: Hyperdynamic LV with mild LVOT gradient. Left ventricular ejection fraction, by estimation, is 70 to 75%. The left ventricle has hyperdynamic function. The left ventricle has no regional wall motion abnormalities. The left ventricular internal cavity size was normal in size. There is mild concentric left ventricular hypertrophy. Left ventricular diastolic parameters are consistent with Grade I diastolic dysfunction (impaired relaxation). Right Ventricle: The right ventricular size is normal. No increase in right ventricular wall thickness. Right ventricular systolic function is normal. Left Atrium: Left atrial size was normal in size. Right Atrium: Right atrial size was normal in size. Pericardium: There is no evidence of pericardial effusion. Mitral Valve: The mitral valve is normal in structure. Trivial mitral valve regurgitation. No evidence of mitral valve stenosis. Tricuspid Valve:  The tricuspid valve is normal in structure. Tricuspid valve regurgitation is not demonstrated. No evidence of tricuspid stenosis. Aortic Valve: The aortic valve is tricuspid. There is mild calcification of the aortic valve. Aortic valve regurgitation is not visualized. Aortic valve sclerosis/calcification is present, without any evidence of aortic stenosis. Aortic valve mean gradient measures 7.0 mmHg. Aortic valve peak gradient measures 13.0 mmHg. Aortic valve area, by VTI measures 2.27 cm. Pulmonic Valve: The pulmonic valve was normal in structure. Pulmonic valve regurgitation is not visualized. No evidence of pulmonic stenosis. Aorta: The aortic root is normal in size and structure. Venous: The inferior vena cava is normal in size with greater than 50% respiratory variability, suggesting right atrial pressure of 3 mmHg. IAS/Shunts: No atrial level shunt detected by color flow Doppler.  LEFT VENTRICLE PLAX 2D LVIDd:         3.80 cm     Diastology LVIDs:         2.00 cm     LV e' medial:    7.83 cm/s LV PW:         1.40 cm     LV E/e' medial:  9.2 LV IVS:        1.30 cm     LV e' lateral:   5.77 cm/s LVOT diam:     2.10 cm     LV E/e' lateral: 12.5 LV SV:         63 LV SV Index:   30 LVOT Area:     3.46 cm  LV Volumes (MOD) LV vol d, MOD A2C: 45.8 ml LV vol d, MOD A4C: 38.0 ml LV vol s, MOD A2C: 7.3 ml LV vol s, MOD A4C: 9.2 ml LV SV MOD A2C:     38.5 ml LV SV MOD A4C:     38.0 ml LV SV MOD BP:      36.2 ml RIGHT VENTRICLE             IVC RV S prime:     19.40 cm/s  IVC diam: 1.70 cm TAPSE (M-mode): 2.3 cm LEFT ATRIUM             Index  RIGHT ATRIUM          Index LA diam:        2.80 cm 1.35 cm/m   RA Area:     9.40 cm LA Vol (A2C):   23.1 ml 11.11 ml/m  RA Volume:   19.20 ml 9.23 ml/m LA Vol (A4C):   15.2 ml 7.31 ml/m LA Biplane Vol: 18.8 ml 9.04 ml/m  AORTIC VALVE AV Area (Vmax):    2.85 cm AV Area (Vmean):   2.61 cm AV Area (VTI):     2.27 cm AV Vmax:           180.00 cm/s AV Vmean:           124.000 cm/s AV VTI:            0.279 m AV Peak Grad:      13.0 mmHg AV Mean Grad:      7.0 mmHg LVOT Vmax:         148.00 cm/s LVOT Vmean:        93.600 cm/s LVOT VTI:          0.183 m LVOT/AV VTI ratio: 0.66  AORTA Ao Root diam: 2.70 cm Ao Asc diam:  3.10 cm MITRAL VALVE MV Area (PHT): 4.49 cm    SHUNTS MV Decel Time: 169 msec    Systemic VTI:  0.18 m MV E velocity: 72.30 cm/s  Systemic Diam: 2.10 cm MV A velocity: 98.50 cm/s MV E/A ratio:  0.73 Arvilla Meres MD Electronically signed by Arvilla Meres MD Signature Date/Time: 06/08/2023/3:01:11 PM    Final         Scheduled Meds:  budesonide (PULMICORT) nebulizer solution  0.25 mg Nebulization BID   enoxaparin (LOVENOX) injection  40 mg Subcutaneous Q24H   famotidine  40 mg Oral Daily   furosemide  40 mg Intravenous Daily   insulin aspart  0-15 Units Subcutaneous TID WC   insulin aspart  0-5 Units Subcutaneous QHS   insulin aspart  6 Units Subcutaneous TID WC   insulin glargine-yfgn  35 Units Subcutaneous Daily   loratadine  10 mg Oral Daily   montelukast  10 mg Oral QHS   pantoprazole  40 mg Oral Daily   thiamine  100 mg Oral Daily   Continuous Infusions:     LOS: 2 days    Time spent: 4 minutes spent on chart review, discussion with nursing staff, consultants, updating family and interview/physical exam; more than 50% of that time was spent in counseling and/or coordination of care.    Joseph Art, DO [facility] Available via Epic secure chat 7am-7pm After these hours, please refer to coverage provider listed on [email or web address] 06/10/2023, 12:17 PM

## 2023-06-10 NOTE — Progress Notes (Signed)
   06/10/23 1515  Assess: MEWS Score  Temp 98.2 F (36.8 C)  BP 94/62  MAP (mmHg) 73  Pulse Rate (!) 116  Resp 20  SpO2 97 %  O2 Device Room Air  Assess: MEWS Score  MEWS Temp 0  MEWS Systolic 1  MEWS Pulse 2  MEWS RR 0  MEWS LOC 0  MEWS Score 3  MEWS Score Color Yellow  Assess: if the MEWS score is Yellow or Red  Were vital signs accurate and taken at a resting state? Yes  Does the patient meet 2 or more of the SIRS criteria? No  MEWS guidelines implemented  Yes, yellow  Treat  MEWS Interventions Considered administering scheduled or prn medications/treatments as ordered  Take Vital Signs  Increase Vital Sign Frequency  Yellow: Q2hr x1, continue Q4hrs until patient remains green for 12hrs  Escalate  MEWS: Escalate Yellow: Discuss with charge nurse and consider notifying provider and/or RRT  Notify: Charge Nurse/RN  Name of Charge Nurse/RN Notified [facility] RN  Provider Notification  Provider Name/Title Hulan Saas  Date Provider Notified 06/10/23  Time Provider Notified 1740  Method of Notification Page  Notification Reason Other (Comment) (yellow MEWs)  Provider response See new orders;Other (Comment) (was aware earlier, lasix stopped)  Date of Provider Response 06/10/23  Time of Provider Response 1745  Assess: SIRS CRITERIA  SIRS Temperature  0  SIRS Pulse 1  SIRS Respirations  0  SIRS WBC 0  SIRS Score Sum  1

## 2023-06-11 DIAGNOSIS — R7401 Elevation of levels of liver transaminase levels: Secondary | ICD-10-CM | POA: Diagnosis not present

## 2023-06-11 DIAGNOSIS — R400 Somnolence: Secondary | ICD-10-CM | POA: Diagnosis not present

## 2023-06-11 LAB — CBC
HCT: 33.1 % — ABNORMAL LOW (ref 36.0–46.0)
Hemoglobin: 10.1 g/dL — ABNORMAL LOW (ref 12.0–15.0)
MCH: 24 pg — ABNORMAL LOW (ref 26.0–34.0)
MCHC: 30.5 g/dL (ref 30.0–36.0)
MCV: 78.8 fL — ABNORMAL LOW (ref 80.0–100.0)
Platelets: 283 10*3/uL (ref 150–400)
RBC: 4.2 MIL/uL (ref 3.87–5.11)
RDW: 17.9 % — ABNORMAL HIGH (ref 11.5–15.5)
WBC: 19.9 10*3/uL — ABNORMAL HIGH (ref 4.0–10.5)
nRBC: 0.6 % — ABNORMAL HIGH (ref 0.0–0.2)

## 2023-06-11 LAB — COMPREHENSIVE METABOLIC PANEL WITH GFR
ALT: 813 U/L — ABNORMAL HIGH (ref 0–44)
AST: 1434 U/L — ABNORMAL HIGH (ref 15–41)
Albumin: 1.7 g/dL — ABNORMAL LOW (ref 3.5–5.0)
Alkaline Phosphatase: 313 U/L — ABNORMAL HIGH (ref 38–126)
Anion gap: 14 (ref 5–15)
BUN: 15 mg/dL (ref 6–20)
CO2: 20 mmol/L — ABNORMAL LOW (ref 22–32)
Calcium: 7.6 mg/dL — ABNORMAL LOW (ref 8.9–10.3)
Chloride: 100 mmol/L (ref 98–111)
Creatinine, Ser: 1.17 mg/dL — ABNORMAL HIGH (ref 0.44–1.00)
GFR, Estimated: 57 mL/min — ABNORMAL LOW (ref >60)
Glucose, Bld: 153 mg/dL — ABNORMAL HIGH (ref 70–99)
Potassium: 3.8 mmol/L (ref 3.5–5.1)
Sodium: 134 mmol/L — ABNORMAL LOW (ref 135–145)
Total Bilirubin: 3.5 mg/dL — ABNORMAL HIGH (ref 0.3–1.2)
Total Protein: 5.9 g/dL — ABNORMAL LOW (ref 6.5–8.1)

## 2023-06-11 LAB — GLUCOSE, CAPILLARY
Glucose-Capillary: 102 mg/dL — ABNORMAL HIGH (ref 70–99)
Glucose-Capillary: 136 mg/dL — ABNORMAL HIGH (ref 70–99)
Glucose-Capillary: 192 mg/dL — ABNORMAL HIGH (ref 70–99)
Glucose-Capillary: 200 mg/dL — ABNORMAL HIGH (ref 70–99)

## 2023-06-11 LAB — LIPASE, BLOOD: Lipase: 64 U/L — ABNORMAL HIGH (ref 11–51)

## 2023-06-11 MED ORDER — ALBUMIN HUMAN 25 % IV SOLN
25.0000 g | Freq: Four times a day (QID) | INTRAVENOUS | Status: DC
  Administered 2023-06-11 (×3): 25 g via INTRAVENOUS
  Filled 2023-06-11 (×4): qty 100

## 2023-06-11 NOTE — Plan of Care (Signed)
  Problem: Activity: Goal: Risk for activity intolerance will decrease Outcome: Progressing   Problem: Nutrition: Goal: Adequate nutrition will be maintained Outcome: Progressing   Problem: Coping: Goal: Level of anxiety will decrease Outcome: Progressing   Problem: Elimination: Goal: Will not experience complications related to bowel motility Outcome: Progressing Goal: Will not experience complications related to urinary retention Outcome: Progressing   

## 2023-06-11 NOTE — Plan of Care (Signed)
  Problem: Pain Managment: Goal: General experience of comfort will improve Outcome: Progressing   Problem: Safety: Goal: Ability to remain free from injury will improve Outcome: Progressing   Problem: Skin Integrity: Goal: Risk for impaired skin integrity will decrease Outcome: Progressing   

## 2023-06-11 NOTE — Progress Notes (Signed)
PROGRESS NOTE    Frances Rodgers  ZOX:[id number] DOB: 1971/08/30 DOA: 06/07/2023 PCP: Tollie Eth, NP    Brief Narrative:   Frances Rodgers is a 52 y.o. female with medical history significant of mild intermittent asthma,, recently diagnosed new onset of IDDM brought in by family member for evaluation of mentation changes.    Assessment and Plan:  Acute metabolic encephalopathy -Probably secondary to profound acid-base abnormalities with combined acute metabolic acidosis and respiratory alkalosis -UDS negative, and patient was treated for UTI recently, UA showed no signs of infection, chest x-ray showed no acute infiltrates, will monitor off antibiotics. -Thyroid function was checked recently -delirium precautions -monitor mental status- appears improved   Acute transaminitis -Unknown etiology, ultrasound showed no obstructive gallstones without signs of acute cholecystitis -no acute findings on CT scan -viral hepatitis panel negative -avoid tylenol. Avoid hypotension ?NASH -labs in AM   Leukocytosis -unclear etiology -monitor  AKI -Likely secondary to poor oral intake and dehydration -resolved   IDDM with starvation ketoacidosis -adjust insulin as able   Peripheral edema -DVT negative -echo done -albumin low-- will give 4 doses   Chronic, mild intermittent asthma -No acute concern, continue ICS and as needed albuterol  Obesity Estimated body mass index is 37.42 kg/m as calculated from the following:   Height as of 05/27/23: 5\' 5"  (1.651 m).   Weight as of 05/27/23: 102 kg.    DVT prophylaxis: enoxaparin (LOVENOX) injection 40 mg Start: 06/07/23 2000    Code Status: Full Code   Disposition Plan:  Level of care: Telemetry Medical Status is: inpt    Consultants:  none   Subjective: Asking about going home  Objective: Vitals:   06/11/23 0840 06/11/23 0841 06/11/23 0859 06/11/23 1203  BP: 98/65 98/65  108/60  Pulse: (!) 112 (!) 112  100  Resp:  18 18 17 18   Temp: 97.6 F (36.4 C) 97.6 F (36.4 C)  98 F (36.7 C)  TempSrc:    Oral  SpO2: 100% 100% 100% 100%    Intake/Output Summary (Last 24 hours) at 06/11/2023 1350 Last data filed at 06/11/2023 0900 Gross per 24 hour  Intake 570 ml  Output 0 ml  Net 570 ml   There were no vitals filed for this visit.  Examination:   General: Appearance:    Obese female in no acute distress     Lungs:     respirations unlabored  Rodgers:    Tachycardic.  + edema   MS:   All extremities are intact.    Neurologic:   Awake, alert       Data Reviewed: I have personally reviewed following labs and imaging studies  CBC: Recent Labs  Lab 06/07/23 1152 06/07/23 1158 06/09/23 0242 06/10/23 0232 06/11/23 0036  WBC 14.4*  --  11.1* 16.0* 19.9*  NEUTROABS 11.7*  --   --   --   --   HGB 10.8* 12.6 10.6* 9.5* 10.1*  HCT 36.1 37.0 34.7* 30.8* 33.1*  MCV 77.5*  --  76.6* 78.0* 78.8*  PLT 397  --  274 292 283   Basic Metabolic Panel: Recent Labs  Lab 06/07/23 1152 06/07/23 1158 06/08/23 1122 06/09/23 0242 06/10/23 0232 06/11/23 0036  NA 134* 134* 137 134* 134* 134*  K 4.1 4.1 4.1 3.7 3.5 3.8  CL 99  --  102 102 101 100  CO2 18*  --  21* 24 22 20*  GLUCOSE 443*  --  369* 328* 189* 153*  BUN 57*  --  29* 20 11 15   CREATININE 1.33*  --  1.03* 0.87 0.68 1.17*  CALCIUM 8.7*  --  8.6* 8.7* 8.0* 7.6*   GFR: CrCl cannot be calculated (Unknown ideal weight.). Liver Function Tests: Recent Labs  Lab 06/07/23 1152 06/08/23 1122 06/09/23 0242 06/11/23 0036  AST 168* 81* 75* 1,434*  ALT 917* 528* 417* 813*  ALKPHOS 419* 320* 299* 313*  BILITOT 1.2 1.0 0.9 3.5*  PROT 6.9 6.6 6.7 5.9*  ALBUMIN 2.1* 2.0* 2.0* 1.7*   Recent Labs  Lab 06/07/23 2150 06/11/23 0036  LIPASE 141* 64*   Recent Labs  Lab 06/07/23 1133 06/08/23 1855  AMMONIA 39* 22   Coagulation Profile: Recent Labs  Lab 06/07/23 2150  INR 1.4*   Cardiac Enzymes: No results for input(s): "CKTOTAL",  "CKMB", "CKMBINDEX", "TROPONINI" in the last 168 hours. BNP (last 3 results) No results for input(s): "PROBNP" in the last 8760 hours. HbA1C: No results for input(s): "HGBA1C" in the last 72 hours. CBG: Recent Labs  Lab 06/10/23 1416 06/10/23 1736 06/10/23 2125 06/11/23 0723 06/11/23 1123  GLUCAP 185* 136* 185* 102* 136*   Lipid Profile: No results for input(s): "CHOL", "HDL", "LDLCALC", "TRIG", "CHOLHDL", "LDLDIRECT" in the last 72 hours.  Thyroid Function Tests: No results for input(s): "TSH", "T4TOTAL", "FREET4", "T3FREE", "THYROIDAB" in the last 72 hours. Anemia Panel: No results for input(s): "VITAMINB12", "FOLATE", "FERRITIN", "TIBC", "IRON", "RETICCTPCT" in the last 72 hours. Sepsis Labs: Recent Labs  Lab 06/07/23 1158 06/07/23 1445 06/09/23 0242  LATICACIDVEN 2.3* 2.3* 1.9    No results found for this or any previous visit (from the past 240 hour(s)).        Radiology Studies: No results found.      Scheduled Meds:  budesonide (PULMICORT) nebulizer solution  0.25 mg Nebulization BID   enoxaparin (LOVENOX) injection  40 mg Subcutaneous Q24H   famotidine  40 mg Oral Daily   insulin aspart  0-15 Units Subcutaneous TID WC   insulin aspart  0-5 Units Subcutaneous QHS   insulin aspart  6 Units Subcutaneous TID WC   insulin glargine-yfgn  35 Units Subcutaneous Daily   loratadine  10 mg Oral Daily   montelukast  10 mg Oral QHS   pantoprazole  40 mg Oral Daily   thiamine  100 mg Oral Daily   Continuous Infusions:  albumin human 25 g (06/11/23 1003)      LOS: 3 days    Time spent: 4 minutes spent on chart review, discussion with nursing staff, consultants, updating family and interview/physical exam; more than 50% of that time was spent in counseling and/or coordination of care.    Joseph Art, DO [facility] Available via Epic secure chat 7am-7pm After these hours, please refer to coverage provider listed on [email or web address] 06/11/2023, 1:50  PM

## 2023-06-12 DIAGNOSIS — R7401 Elevation of levels of liver transaminase levels: Secondary | ICD-10-CM | POA: Diagnosis not present

## 2023-06-12 DIAGNOSIS — K838 Other specified diseases of biliary tract: Secondary | ICD-10-CM | POA: Diagnosis not present

## 2023-06-12 DIAGNOSIS — R7989 Other specified abnormal findings of blood chemistry: Secondary | ICD-10-CM | POA: Diagnosis not present

## 2023-06-12 DIAGNOSIS — R935 Abnormal findings on diagnostic imaging of other abdominal regions, including retroperitoneum: Secondary | ICD-10-CM

## 2023-06-12 DIAGNOSIS — R739 Hyperglycemia, unspecified: Secondary | ICD-10-CM | POA: Diagnosis not present

## 2023-06-12 HISTORY — DX: Elevation of levels of liver transaminase levels: R74.01

## 2023-06-12 LAB — GLUCOSE, CAPILLARY
Glucose-Capillary: 186 mg/dL — ABNORMAL HIGH (ref 70–99)
Glucose-Capillary: 174 mg/dL — ABNORMAL HIGH (ref 70–99)
Glucose-Capillary: 264 mg/dL — ABNORMAL HIGH (ref 70–99)
Glucose-Capillary: 237 mg/dL — ABNORMAL HIGH (ref 70–99)
Glucose-Capillary: 246 mg/dL — ABNORMAL HIGH (ref 70–99)

## 2023-06-12 LAB — PROTIME-INR
INR: 3.3 — ABNORMAL HIGH (ref 0.8–1.2)
Prothrombin Time: 33.9 seconds — ABNORMAL HIGH (ref 11.4–15.2)

## 2023-06-12 MED ORDER — VITAMIN K1 10 MG/ML IJ SOLN
10.0000 mg | Freq: Every day | INTRAVENOUS | Status: AC
  Administered 2023-06-14: 10 mg via INTRAVENOUS
  Filled 2023-06-12 (×2): qty 1

## 2023-06-12 MED ORDER — MORPHINE SULFATE (PF) 2 MG/ML IV SOLN
1.0000 mg | INTRAVENOUS | Status: DC | PRN
  Administered 2023-06-12 – 2023-06-16 (×11): 1 mg via INTRAVENOUS
  Filled 2023-06-12 (×11): qty 1

## 2023-06-12 MED ORDER — VITAMIN K1 10 MG/ML IJ SOLN
1.0000 mg | Freq: Every day | INTRAVENOUS | Status: DC
  Administered 2023-06-12: 1 mg via INTRAVENOUS
  Filled 2023-06-12 (×2): qty 0.1

## 2023-06-12 NOTE — Progress Notes (Signed)
Patients refused albumin (human) 25% solution, she says it gives her pain and needs pain medicine for it

## 2023-06-12 NOTE — Consult Note (Signed)
Consultation  Referring Provider: [facility]/ Vanga Primary Care Physician:  Arbie Cookey Sung Amabile, NP Primary Gastroenterologist:  Dr.Vanga/ [facility]  Reason for Consultation:   Elevated LFT's  HPI: Frances Rodgers is a 52 y.o. female, who was admitted through the emergency room 5 days ago after being found at home with altered mental status.  Lactate was elevated at 2.3, and glucose of 432, other parameters not consistent with DKA but felt to have a metabolic acidosis and respiratory alkalosis likely responsible for the altered mental status. She had just had a recent admission 7/13 through 06/01/2023 also presenting with altered mental status but with new finding of diabetes with DKA and probable sepsis secondary to UTI.  She was treated with a course of ceftriaxone during that admission, not discharged home on antibiotics.  LFT's were normal during that admission. On arrival here on 06/07/2023 T. bili 1.2/alk phos 119/AST 168/ALT 917. EtOH negative, acetaminophen level negative, drug screen negative Acute hepatitis panel negative.  She had upper abdominal ultrasound on 06/07/2023 showing small layering gallstones and sludge no gallbladder wall thickening, CBD of 4 mm and increased echogenicity of the liver CT of the abdomen pelvis on 06/08/2023 showed gallstones not appreciated, there is mild diffuse fatty changes of the liver, spleen normal size, multiple uterine fibroids, and changes consistent with question gastric sleeve.  Patient says she has no history of any liver issues that she knows of, no prior history of hepatitis or elevated LFTs.  She is known to Dr. Christ[facility] and had been seen for iron deficiency anemia in May 2024.  This is felt secondary to menorrhagia, patient has had previous iron infusions. There was to be consideration of screening colonoscopy, and EGD but that had not occurred as yet. Patient also with history of asthma and says that she did not have a gastric sleeve done  but had a lap band which was subsequently removed.  Had not been on any new medications or supplements other than what she was discharged on on diabetes after this most recent admission.  On 06/09/2023 T. bili 0.9/AST 75/ALT 417/alk phos 299    06/07/2023 pro time 17.4/INR 1.4 06/11/2023 T. bili 3.5/AST 1434/ALT 813/alk phos 313  Today pro time 33.9/INR 3.3 T. bili 4.1/alk phos 258/AST 943/ALT 895  No complaints of abdominal pain or discomfort, no nausea or vomiting, has been eating without difficulty .No fever or chills.  No family history of liver disease that she is aware of.     Past Medical History:  Diagnosis Date   Allergy    Anemia    Asthma    Blood transfusion without reported diagnosis 2023   GERD (gastroesophageal reflux disease)    Seasonal allergies     Past Surgical History:  Procedure Laterality Date   GASTRIC BYPASS  2015   NASAL POLYP SURGERY  2010   WISDOM TOOTH EXTRACTION      Prior to Admission medications   Medication Sig Start Date End Date Taking? Authorizing Provider  albuterol (VENTOLIN HFA) 108 (90 Base) MCG/ACT inhaler Inhale 2 puffs into the lungs every 6 (six) hours as needed for wheezing or shortness of breath.   Yes [provider]  Cetirizine HCl 10 MG CAPS Take 10 mg by mouth daily as needed (allergy). 06/02/03  Yes [provider]  FLOVENT DISKUS 50 MCG/ACT AEPB Take 1 Inhalation by mouth every 12 (twelve) hours as needed (SOB).   Yes [provider]  insulin glargine (LANTUS) 100 UNIT/ML  Solostar Pen Inject 22 Units into the skin at bedtime. May substitute as needed per insurance. Patient taking differently: Inject 22 Units into the skin at bedtime. 06/01/23 08/30/23 Yes Darlin Priestly, MD  insulin lispro (HUMALOG) 100 UNIT/ML KwikPen Inject 6 Units into the skin 3 (three) times daily with meals. Only take if eating a meal AND Blood Glucose (BG) is 80 or higher. Patient taking differently: Inject 6 Units into the skin 3  (three) times daily with meals. 06/01/23 08/30/23 Yes Darlin Priestly, MD  JUNEL FE 24 1-20 MG-MCG(24) tablet TAKE 1 TABLET BY MOUTH DAILY Patient taking differently: Take 1 tablet by mouth at bedtime. 02/14/23  Yes Hildred Laser, MD  montelukast (SINGULAIR) 10 MG tablet Take 10 mg by mouth at bedtime.   Yes [provider]  Multiple Vitamin (MULTI VITAMIN DAILY PO) Take 1 tablet by mouth daily.   Yes [provider]  pantoprazole (PROTONIX) 40 MG tablet Take 40 mg by mouth daily as needed (acid reflex). 04/12/19  Yes [provider]  QVAR REDIHALER 40 MCG/ACT inhaler Inhale 1 puff into the lungs 2 (two) times daily. 12/09/22  Yes [provider]  Blood Glucose Monitoring Suppl DEVI 1 each by Does not apply route 3 (three) times daily. May dispense any manufacturer covered by patient's insurance. 06/01/23   Darlin Priestly, MD  Glucose Blood (BLOOD GLUCOSE TEST STRIPS) STRP 1 each by Does not apply route 3 (three) times daily. Use as directed to check blood sugar. May dispense any manufacturer covered by patient's insurance and fits patient's device. 06/01/23   Darlin Priestly, MD  Insulin Pen Needle (PEN NEEDLES) 31G X 5 MM MISC 1 each by Does not apply route 3 (three) times daily. May dispense any manufacturer covered by patient's insurance. 06/01/23   Darlin Priestly, MD  Lancet Device MISC 1 each by Does not apply route 3 (three) times daily. May dispense any manufacturer covered by patient's insurance. 06/01/23   Darlin Priestly, MD  Lancets MISC 1 each by Does not apply route 3 (three) times daily. Use as directed to check blood sugar. May dispense any manufacturer covered by patient's insurance and fits patient's device. 06/01/23   Darlin Priestly, MD    Current Facility-Administered Medications  Medication Dose Route Frequency Provider Last Rate Last Admin   albuterol (PROVENTIL) (2.5 MG/3ML) 0.083% nebulizer solution 3 mL  3 mL Inhalation Q6H PRN Mikey College T, MD   3 mL at 06/08/23 1241   alum & mag  hydroxide-simeth (MAALOX/MYLANTA) 200-200-20 MG/5ML suspension 30 mL  30 mL Oral Q4H PRN Marlin Canary U, DO   30 mL at 06/10/23 0516   budesonide (PULMICORT) nebulizer solution 0.25 mg  0.25 mg Nebulization BID Mervyn Gay A, RPH   0.25 mg at 06/12/23 0837   enoxaparin (LOVENOX) injection 40 mg  40 mg Subcutaneous Q24H Mikey College T, MD   40 mg at 06/11/23 2148   famotidine (PEPCID) tablet 40 mg  40 mg Oral Daily Marlin Canary U, DO   40 mg at 06/10/23 1115   insulin aspart (novoLOG) injection 0-15 Units  0-15 Units Subcutaneous TID WC Mikey College T, MD   3 Units at 06/12/23 1204   insulin aspart (novoLOG) injection 0-5 Units  0-5 Units Subcutaneous QHS Gery Pray, MD   4 Units at 06/08/23 2115   insulin aspart (novoLOG) injection 6 Units  6 Units Subcutaneous TID WC Vann, Jessica U, DO   6 Units at 06/12/23 1204   insulin glargine-yfgn (SEMGLEE)  injection 35 Units  35 Units Subcutaneous Daily Joseph Art, DO   35 Units at 06/12/23 4696   loratadine (CLARITIN) tablet 10 mg  10 mg Oral Daily Mikey College T, MD   10 mg at 06/11/23 0839   montelukast (SINGULAIR) tablet 10 mg  10 mg Oral QHS Mikey College T, MD   10 mg at 06/11/23 2149   morphine (PF) 2 MG/ML injection 1 mg  1 mg Intravenous Q4H PRN Marlin Canary U, DO   1 mg at 06/12/23 1341   ondansetron (ZOFRAN) tablet 4 mg  4 mg Oral Q6H PRN Mikey College T, MD   4 mg at 06/09/23 1715   Or   ondansetron (ZOFRAN) injection 4 mg  4 mg Intravenous Q6H PRN Mikey College T, MD   4 mg at 06/12/23 0214   pantoprazole (PROTONIX) EC tablet 40 mg  40 mg Oral Daily Mikey College T, MD   40 mg at 06/11/23 2952   thiamine (VITAMIN B1) tablet 100 mg  100 mg Oral Daily Pham, Minh Q, RPH-CPP   100 mg at 06/12/23 8413    Allergies as of 06/07/2023 - Review Complete 06/07/2023  Allergen Reaction Noted   Aspirin Anaphylaxis and Shortness Of Breath 01/12/1990   Ketorolac Anaphylaxis 03/08/2013   Ketorolac tromethamine Shortness Of Breath 11/14/1998     Family History  Problem Relation Age of Onset   Diabetes Mother    Stroke Mother    Diabetes Brother    Stroke Brother    Pancreatic cancer Maternal Grandmother     Social History   Socioeconomic History   Marital status: Single    Spouse name: Not on file   Number of children: Not on file   Years of education: Not on file   Highest education level: Not on file  Occupational History   Not on file  Tobacco Use   Smoking status: Never   Smokeless tobacco: Never  Vaping Use   Vaping status: Never Used  Substance and Sexual Activity   Alcohol use: Never   Drug use: Never   Sexual activity: Not Currently    Birth control/protection: Pill  Other Topics Concern   Not on file  Social History Narrative   Not on file   Social Determinants of Health   Financial Resource Strain: Not on file  Food Insecurity: No Food Insecurity (05/31/2023)   Hunger Vital Sign    Worried About Running Out of Food in the Last Year: Never true    Ran Out of Food in the Last Year: Never true  Transportation Needs: No Transportation Needs (05/31/2023)   PRAPARE - Administrator, Civil Service (Medical): No    Lack of Transportation (Non-Medical): No  Physical Activity: Not on file  Stress: Not on file  Social Connections: Not on file  Intimate Partner Violence: Not At Risk (05/31/2023)   Humiliation, Afraid, Rape, and Kick questionnaire    Fear of Current or Ex-Partner: No    Emotionally Abused: No    Physically Abused: No    Sexually Abused: No    Review of Systems: Pertinent positive and negative review of systems were noted in the above HPI section.  All other review of systems was otherwise negative.   Physical Exam: Vital signs in last 24 hours: Temp:  [97.8 F (36.6 C)-98.5 F (36.9 C)] 98.3 F (36.8 C) (07/29 1307) Pulse Rate:  [111-125] 125 (07/29 1307) Resp:  [16-19] 18 (07/29 1307) BP: (102-135)/(64-71) 119/64 (07/29  1307) SpO2:  [96 %-100 %] 100 % (07/29  1307) Last BM Date : 06/11/23 General:   Alert,  Well-developed, well-nourished, older African-American female pleasant and cooperative in NAD Head:  Normocephalic and atraumatic. Eyes:  Sclera clear, no obvious icterus   conjunctiva pink. Ears:  Normal auditory acuity. Nose:  No deformity, discharge,  or lesions. Mouth:  No deformity or lesions.   Neck:  Supple; no masses or thyromegaly. Lungs:  Clear throughout to auscultation.   No wheezes, crackles, or rhonchi.  Heart:  Regular rate and rhythm; no murmurs, clicks, rubs,  or gallops. Abdomen:  Soft,nontender, BS active,nonpalp mass or hsm.   Rectal: Not done today Msk:  Symmetrical without gross deformities. . Pulses:  Normal pulses noted. Extremities: Significant bilateral lower extremity. Neurologic:  Alert and  oriented x4;  grossly normal neurologically. Skin:  Intact without significant lesions or rashes.. Psych:  Alert and cooperative. Normal mood and affect.  Intake/Output from previous day: 07/28 0701 - 07/29 0700 In: 952.9 [P.O.:700; IV Piggyback:252.9] Out: 0  Intake/Output this shift: Total I/O In: 240 [P.O.:240] Out: -   Lab Results: Recent Labs    06/10/23 0232 06/11/23 0036 06/12/23 0323  WBC 16.0* 19.9* 11.5*  HGB 9.5* 10.1* 9.8*  HCT 30.8* 33.1* 32.1*  PLT 292 283 262   BMET Recent Labs    06/10/23 0232 06/11/23 0036 06/12/23 0323  NA 134* 134* 133*  K 3.5 3.8 3.9  CL 101 100 98  CO2 22 20* 21*  GLUCOSE 189* 153* 195*  BUN 11 15 17   CREATININE 0.68 1.17* 0.98  CALCIUM 8.0* 7.6* 8.1*   LFT Recent Labs    06/12/23 0323  PROT 5.8*  ALBUMIN 2.5*  AST 943*  ALT 895*  ALKPHOS 258*  BILITOT 4.1*  BILIDIR 2.6*   PT/INR Recent Labs    06/12/23 0939  LABPROT 33.9*  INR 3.3*   Hepatitis Panel No results for input(s): "HEPBSAG", "HCVAB", "HEPAIGM", "HEPBIGM" in the last 72 hours.   IMPRESSION:   #60 52 year old African-American female with new diagnosis of diabetes made at the time  of last admission 2 weeks ago when she presented with DKA.  Also had UTI at that time with possible sepsis and treated with ceftriaxone Readmitted 5 days ago again with altered mental status and found to have glucose 432 and metabolic acidosis/respiratory alkalosis but no DKA.  #2 new elevation of LFTs this admission, and now with rise in INR concerning for Significant hepatic dysfunction.  Etiology of the elevated LFTs at this time is not clear with negative workup thus far.  She does have hepatic steatosis on imaging but has not had previously elevated LFTs, and NASH alone is not the cause for the abrupt change in LFTs.  One possible etiology is DI LI/drug-induced liver injury i.e. ceftriaxone Or other viral etiologies i.e. CMV or EBV will also rule out underlying autoimmune hepatitis.  #3 asthma #4.  Status post lap band and removal # 5 history of iron deficiency anemia felt secondary to menorrhagia with multiple fibroids on CT  Plan; start vitamin K, IV x 2 Daily pro time/INR Will send off serologies for CMV and EBV, HSV, autoimmune markers. Consider repeat imaging of the portal system, will discuss with Dr. Jacqlyn Krauss PA-C 06/12/2023, 2:58 PM

## 2023-06-12 NOTE — Progress Notes (Signed)
PT Cancellation Note  Patient Details Name: Frances Rodgers MRN: [id number] DOB: August 11, 1971   Cancelled Treatment:    Reason Eval/Treat Not Completed: (P) Patient declined, no reason specified (PTA attempt at 12:34 and pt defers due to pain, RN had not had time to give meds yet at 1:15pm so PTA check back 2:43pm and pt refused due to fatigue.) Discussed benefits of OOB mobility and PT plan of care, pt reports she is agreeable to attempt again tomorrow. She states she will be staying with her daughter when she discharges where she will not need to do any stairs to get in. Pt reports she has been up to chair and walked to bathroom between when PTA checked on her initially and when re-attempted. Will continue efforts per PT plan of care as schedule permits.   [name] Josilyn Shippee 06/12/2023, 2:52 PM

## 2023-06-12 NOTE — Progress Notes (Signed)
PROGRESS NOTE    Frances Rodgers  ZOX:[id number] DOB: [birth year] DOA: 06/07/2023 PCP: Tollie Eth, NP    Brief Narrative:   Frances Rodgers is a 52 y.o. female with medical history significant of mild intermittent asthma,, recently diagnosed new onset of IDDM brought in by family member for evaluation of mentation changes.  LFTs elevated.   GI consulted   Assessment and Plan:  Acute metabolic encephalopathy -Probably secondary to profound acid-base abnormalities with combined acute metabolic acidosis and respiratory alkalosis -UDS negative, and patient was treated for UTI recently, UA showed no signs of infection, chest x-ray showed no acute infiltrates, will monitor off antibiotics. -Thyroid function was checked recently -delirium precautions -monitor mental status- appears improved   Acute transaminitis -Unknown etiology, ultrasound showed no obstructive gallstones without signs of acute cholecystitis -no acute findings on CT scan -viral hepatitis panel negative -avoid tylenol. Avoid hypotension ?NASH -asked GI to see as INR increased/LFTs up still but now trending down   Leukocytosis -unclear etiology -monitor  AKI -Likely secondary to poor oral intake and dehydration -resolved   IDDM with starvation ketoacidosis -adjust insulin as able   Peripheral edema -DVT negative -echo done -albumin low   Chronic, mild intermittent asthma -No acute concern, continue ICS and as needed albuterol  Obesity Estimated body mass index is 37.42 kg/m as calculated from the following:   Height as of 05/27/23: 5\' 5"  (1.651 m).   Weight as of 05/27/23: 102 kg.    DVT prophylaxis: enoxaparin (LOVENOX) injection 40 mg Start: 06/07/23 2000    Code Status: Full Code   Disposition Plan:  Level of care: Telemetry Medical Status is: inpt    Consultants:  GI   Subjective: Said albumin made her stomach hurt  Objective: Vitals:   06/12/23 0516 06/12/23 0837 06/12/23 0850  06/12/23 1307  BP: 114/71  102/64 119/64  Pulse: (!) 111  (!) 117 (!) 125  Resp: 16  19 18   Temp: 98.5 F (36.9 C)  98.2 F (36.8 C) 98.3 F (36.8 C)  TempSrc: Oral     SpO2: 100% 99% 96% 100%    Intake/Output Summary (Last 24 hours) at 06/12/2023 1400 Last data filed at 06/12/2023 0900 Gross per 24 hour  Intake 712.85 ml  Output 0 ml  Net 712.85 ml   There were no vitals filed for this visit.  Examination:   General: Appearance:    Obese female in no acute distress     Lungs:     respirations unlabored  Heart:    Tachycardic.  + edema but improved   MS:   All extremities are intact.    Neurologic:   Awake, alert- withdrawn       Data Reviewed: I have personally reviewed following labs and imaging studies  CBC: Recent Labs  Lab 06/07/23 1152 06/07/23 1158 06/09/23 0242 06/10/23 0232 06/11/23 0036 06/12/23 0323  WBC 14.4*  --  11.1* 16.0* 19.9* 11.5*  NEUTROABS 11.7*  --   --   --   --   --   HGB 10.8* 12.6 10.6* 9.5* 10.1* 9.8*  HCT 36.1 37.0 34.7* 30.8* 33.1* 32.1*  MCV 77.5*  --  76.6* 78.0* 78.8* 76.6*  PLT 397  --  274 292 283 262   Basic Metabolic Panel: Recent Labs  Lab 06/08/23 1122 06/09/23 0242 06/10/23 0232 06/11/23 0036 06/12/23 0323  NA 137 134* 134* 134* 133*  K 4.1 3.7 3.5 3.8 3.9  CL 102 102 101 100 98  CO2 21* 24 22 20* 21*  GLUCOSE 369* 328* 189* 153* 195*  BUN 29* 20 11 15 17   CREATININE 1.03* 0.87 0.68 1.17* 0.98  CALCIUM 8.6* 8.7* 8.0* 7.6* 8.1*   GFR: CrCl cannot be calculated (Unknown ideal weight.). Liver Function Tests: Recent Labs  Lab 06/07/23 1152 06/08/23 1122 06/09/23 0242 06/11/23 0036 06/12/23 0323  AST 168* 81* 75* 1,434* 943*  ALT 917* 528* 417* 813* 895*  ALKPHOS 419* 320* 299* 313* 258*  BILITOT 1.2 1.0 0.9 3.5* 4.1*  PROT 6.9 6.6 6.7 5.9* 5.8*  ALBUMIN 2.1* 2.0* 2.0* 1.7* 2.5*   Recent Labs  Lab 06/07/23 2150 06/11/23 0036  LIPASE 141* 64*   Recent Labs  Lab 06/07/23 1133 06/08/23 1855   AMMONIA 39* 22   Coagulation Profile: Recent Labs  Lab 06/07/23 2150 06/12/23 0939  INR 1.4* 3.3*   Cardiac Enzymes: No results for input(s): "CKTOTAL", "CKMB", "CKMBINDEX", "TROPONINI" in the last 168 hours. BNP (last 3 results) No results for input(s): "PROBNP" in the last 8760 hours. HbA1C: No results for input(s): "HGBA1C" in the last 72 hours. CBG: Recent Labs  Lab 06/11/23 1621 06/11/23 2055 06/12/23 0720 06/12/23 1120 06/12/23 1312  GLUCAP 192* 200* 186* 174* 264*   Lipid Profile: No results for input(s): "CHOL", "HDL", "LDLCALC", "TRIG", "CHOLHDL", "LDLDIRECT" in the last 72 hours.  Thyroid Function Tests: No results for input(s): "TSH", "T4TOTAL", "FREET4", "T3FREE", "THYROIDAB" in the last 72 hours. Anemia Panel: No results for input(s): "VITAMINB12", "FOLATE", "FERRITIN", "TIBC", "IRON", "RETICCTPCT" in the last 72 hours. Sepsis Labs: Recent Labs  Lab 06/07/23 1158 06/07/23 1445 06/09/23 0242  LATICACIDVEN 2.3* 2.3* 1.9    No results found for this or any previous visit (from the past 240 hour(s)).        Radiology Studies: No results found.      Scheduled Meds:  budesonide (PULMICORT) nebulizer solution  0.25 mg Nebulization BID   enoxaparin (LOVENOX) injection  40 mg Subcutaneous Q24H   famotidine  40 mg Oral Daily   insulin aspart  0-15 Units Subcutaneous TID WC   insulin aspart  0-5 Units Subcutaneous QHS   insulin aspart  6 Units Subcutaneous TID WC   insulin glargine-yfgn  35 Units Subcutaneous Daily   loratadine  10 mg Oral Daily   montelukast  10 mg Oral QHS   pantoprazole  40 mg Oral Daily   thiamine  100 mg Oral Daily   Continuous Infusions:      LOS: 4 days    Time spent: 4 minutes spent on chart review, discussion with nursing staff, consultants, updating family and interview/physical exam; more than 50% of that time was spent in counseling and/or coordination of care.    Joseph Art, DO [facility] Available via Epic secure chat 7am-7pm After these hours, please refer to coverage provider listed on [email or web address] 06/12/2023, 2:00 PM

## 2023-06-12 NOTE — TOC Progression Note (Signed)
Transition of Care [facility]) - Progression Note    Patient Details  Name: Frances Rodgers MRN: [id number] Date of Birth: 09/12/1971  [facility] CM/SW Contact  Ronny Bacon, RN Phone Number: 06/12/2023, 3:16 PM  Clinical Narrative:   Refused PT today, plans to stay with daughter at discharge.         Expected Discharge Plan and Services                                               Social Determinants of Health (SDOH) Interventions SDOH Screenings   Food Insecurity: No Food Insecurity (05/31/2023)  Housing: Low Risk  (05/31/2023)  Transportation Needs: No Transportation Needs (05/31/2023)  Utilities: Not At Risk (05/31/2023)  Alcohol Screen: Low Risk  (05/03/2018)  Depression (PHQ2-9): Low Risk  (04/12/2023)  Tobacco Use: Low Risk  (05/31/2023)    Readmission Risk Interventions     No data to display

## 2023-06-13 ENCOUNTER — Inpatient Hospital Stay (HOSPITAL_COMMUNITY): Payer: 59

## 2023-06-13 ENCOUNTER — Encounter: Payer: Self-pay | Admitting: Internal Medicine

## 2023-06-13 DIAGNOSIS — R739 Hyperglycemia, unspecified: Secondary | ICD-10-CM | POA: Diagnosis not present

## 2023-06-13 DIAGNOSIS — R935 Abnormal findings on diagnostic imaging of other abdominal regions, including retroperitoneum: Secondary | ICD-10-CM | POA: Diagnosis not present

## 2023-06-13 DIAGNOSIS — R7401 Elevation of levels of liver transaminase levels: Secondary | ICD-10-CM | POA: Diagnosis not present

## 2023-06-13 DIAGNOSIS — R7989 Other specified abnormal findings of blood chemistry: Secondary | ICD-10-CM | POA: Diagnosis not present

## 2023-06-13 DIAGNOSIS — K838 Other specified diseases of biliary tract: Secondary | ICD-10-CM | POA: Diagnosis not present

## 2023-06-13 LAB — GLUCOSE, CAPILLARY
Glucose-Capillary: 223 mg/dL — ABNORMAL HIGH (ref 70–99)
Glucose-Capillary: 211 mg/dL — ABNORMAL HIGH (ref 70–99)
Glucose-Capillary: 269 mg/dL — ABNORMAL HIGH (ref 70–99)
Glucose-Capillary: 181 mg/dL — ABNORMAL HIGH (ref 70–99)

## 2023-06-13 MED ORDER — GADOBUTROL 1 MMOL/ML IV SOLN
10.0000 mL | Freq: Once | INTRAVENOUS | Status: AC | PRN
  Administered 2023-06-13: 10 mL via INTRAVENOUS

## 2023-06-13 MED ORDER — INSULIN GLARGINE-YFGN 100 UNIT/ML ~~LOC~~ SOLN
38.0000 [IU] | Freq: Every day | SUBCUTANEOUS | Status: DC
  Administered 2023-06-14: 38 [IU] via SUBCUTANEOUS
  Filled 2023-06-13: qty 0.38

## 2023-06-13 NOTE — Progress Notes (Signed)
Physical Therapy Treatment Patient Details Name: Frances Rodgers MRN: [id number] DOB: 08/12/1971 Today's Date: 06/13/2023   History of Present Illness Pt is 52 yo female admitted on 06/07/23 with acute metabolic encephalopathy likely secondary to profound acid-base abnormalities.  Pt with hx including but not limited to asthma and new onset IDDM.    PT Comments  Pt received in supine, agreeable to therapy session with heavy encouragement, pt self-limiting due to c/o bil feet pain and fatigue. Pt with noted elevated HR to ~142 bpm with minimal exertion (short household distance gait trial using RW), RN notified. Pt progressing slowly toward PT goals per plan of care and now reports she plans to stay with her daughter upon DC where she won't need to perform any steps to get into home. Pt may benefit from post-acute PT upon DC at home, will continue to assess next session pending pt progress and wishes.   If plan is discharge home, recommend the following: A little help with walking and/or transfers;A little help with bathing/dressing/bathroom;Help with stairs or ramp for entrance;Assistance with cooking/housework   Can travel by private vehicle        Equipment Recommendations  None recommended by PT;Other (comment) (recommend she continue using her RW at home)    Recommendations for Other Services       Precautions / Restrictions Precautions Precautions: Fall Restrictions Weight Bearing Restrictions: No     Mobility  Bed Mobility Overal bed mobility: Needs Assistance             General bed mobility comments: pt received sitting EOB and wanting to remain EOB at end of session, pt defers assist to bring legs back up onto mattress.    Transfers Overall transfer level: Needs assistance Equipment used: None   Sit to Stand: Supervision           General transfer comment: from EOB>RW, no lift assist needed    Ambulation/Gait Ambulation/Gait assistance: Supervision Gait  Distance (Feet): 50 Feet Assistive device: Rolling walker (2 wheels) Gait Pattern/deviations: Decreased stride length, Step-through pattern Gait velocity: decreased     General Gait Details: Fair RW use, min cues for proximity to RW; HR elevated to 142 bpm with exertion, RN notified. Pt self-limiting distance due to c/o BLE pain and fatigue. Pt defers orthostatic BP today upon return to room.   Stairs Stairs:  (pt defers, says she will be staying with daughter who does not have stairs to get into home.)           Wheelchair Mobility     Tilt Bed    Modified Rankin (Stroke Patients Only)       Balance Overall balance assessment: Mild deficits observed, not formally tested         Standing balance support: Bilateral upper extremity supported, Single extremity supported Standing balance-Leahy Scale: Fair Standing balance comment: RW due to BLE pain                            Cognition Arousal/Alertness: Awake/alert Behavior During Therapy: Flat affect Overall Cognitive Status: Impaired/Different from baseline Area of Impairment: Problem solving, Awareness, Safety/judgement, Attention                         Safety/Judgement: Decreased awareness of deficits, Decreased awareness of safety Awareness: Emergent   General Comments: Encouragement to participate.        Exercises  General Comments General comments (skin integrity, edema, etc.): HR elevated to 142 bpm with exertion; no other acute s/sx distress      Pertinent Vitals/Pain Pain Assessment Pain Assessment: Faces Faces Pain Scale: Hurts a little bit Pain Location: bil feet Pain Descriptors / Indicators: Grimacing Pain Intervention(s): Limited activity within patient's tolerance, Monitored during session, Repositioned     PT Goals (current goals can now be found in the care plan section) Acute Rehab PT Goals Patient Stated Goal: return home PT Goal Formulation: With  patient Time For Goal Achievement: 06/23/23 Progress towards PT goals: Progressing toward goals    Frequency    Min 1X/week      PT Plan Current plan remains appropriate       AM-PAC PT "6 Clicks" Mobility   Outcome Measure  Help needed turning from your back to your side while in a flat bed without using bedrails?: None Help needed moving from lying on your back to sitting on the side of a flat bed without using bedrails?: None Help needed moving to and from a bed to a chair (including a wheelchair)?: A Little Help needed standing up from a chair using your arms (e.g., wheelchair or bedside chair)?: A Little Help needed to walk in hospital room?: A Little Help needed climbing 3-5 steps with a railing? : A Lot 6 Click Score: 19    End of Session Equipment Utilized During Treatment: Gait belt Activity Tolerance: Patient limited by pain;Treatment limited secondary to medical complications (Comment);Other (comment) (tachycardia with gait) Patient left: in bed;with call bell/phone within reach (per RN OK to leave bed alarm off) Nurse Communication: Mobility status;Other (comment) (tachycardia with standing) PT Visit Diagnosis: Other abnormalities of gait and mobility (R26.89);Unsteadiness on feet (R26.81)     Time: 1610-9604 PT Time Calculation (min) (ACUTE ONLY): 8 min  Charges:    $Gait Training: 8-22 mins PT General Charges $$ ACUTE PT VISIT: 1 Visit                     Alexios Keown P., PTA Acute Rehabilitation Services Secure Chat Preferred 9a-5:30pm Office: [phone]    Angus Palms 06/13/2023, 6:49 PM

## 2023-06-13 NOTE — Plan of Care (Signed)
Problem: Education: Goal: Ability to describe self-care measures that may prevent or decrease complications (Diabetes Survival Skills Education) will improve Outcome: Progressing Goal: Individualized Educational Video(s) Outcome: Progressing   Problem: Cardiac: Goal: Ability to maintain an adequate cardiac output will improve Outcome: Progressing   Problem: Health Behavior/Discharge Planning: Goal: Ability to identify and utilize available resources and services will improve Outcome: Progressing Goal: Ability to manage health-related needs will improve Outcome: Progressing   Problem: Fluid Volume: Goal: Ability to achieve a balanced intake and output will improve Outcome: Progressing   Problem: Metabolic: Goal: Ability to maintain appropriate glucose levels will improve Outcome: Progressing   Problem: Nutritional: Goal: Maintenance of adequate nutrition will improve Outcome: Progressing Goal: Maintenance of adequate weight for body size and type will improve Outcome: Progressing   Problem: Respiratory: Goal: Will regain and/or maintain adequate ventilation Outcome: Progressing   Problem: Urinary Elimination: Goal: Ability to achieve and maintain adequate renal perfusion and functioning will improve Outcome: Progressing   Problem: Education: Goal: Ability to describe self-care measures that may prevent or decrease complications (Diabetes Survival Skills Education) will improve Outcome: Progressing Goal: Individualized Educational Video(s) Outcome: Progressing   Problem: Coping: Goal: Ability to adjust to condition or change in health will improve Outcome: Progressing   Problem: Fluid Volume: Goal: Ability to maintain a balanced intake and output will improve Outcome: Progressing   Problem: Health Behavior/Discharge Planning: Goal: Ability to identify and utilize available resources and services will improve Outcome: Progressing Goal: Ability to manage  health-related needs will improve Outcome: Progressing   Problem: Metabolic: Goal: Ability to maintain appropriate glucose levels will improve Outcome: Progressing   Problem: Nutritional: Goal: Maintenance of adequate nutrition will improve Outcome: Progressing Goal: Progress toward achieving an optimal weight will improve Outcome: Progressing   Problem: Skin Integrity: Goal: Risk for impaired skin integrity will decrease Outcome: Progressing   Problem: Tissue Perfusion: Goal: Adequacy of tissue perfusion will improve Outcome: Progressing   Problem: Education: Goal: Ability to describe self-care measures that may prevent or decrease complications (Diabetes Survival Skills Education) will improve Outcome: Progressing Goal: Individualized Educational Video(s) Outcome: Progressing   Problem: Coping: Goal: Ability to adjust to condition or change in health will improve Outcome: Progressing   Problem: Fluid Volume: Goal: Ability to maintain a balanced intake and output will improve Outcome: Progressing   Problem: Health Behavior/Discharge Planning: Goal: Ability to identify and utilize available resources and services will improve Outcome: Progressing Goal: Ability to manage health-related needs will improve Outcome: Progressing   Problem: Metabolic: Goal: Ability to maintain appropriate glucose levels will improve Outcome: Progressing   Problem: Nutritional: Goal: Maintenance of adequate nutrition will improve Outcome: Progressing Goal: Progress toward achieving an optimal weight will improve Outcome: Progressing   Problem: Skin Integrity: Goal: Risk for impaired skin integrity will decrease Outcome: Progressing   Problem: Tissue Perfusion: Goal: Adequacy of tissue perfusion will improve Outcome: Progressing   Problem: Education: Goal: Knowledge of General Education information will improve Description: Including pain rating scale, medication(s)/side effects  and non-pharmacologic comfort measures Outcome: Progressing   Problem: Health Behavior/Discharge Planning: Goal: Ability to manage health-related needs will improve Outcome: Progressing   Problem: Clinical Measurements: Goal: Ability to maintain clinical measurements within normal limits will improve Outcome: Progressing Goal: Will remain free from infection Outcome: Progressing Goal: Diagnostic test results will improve Outcome: Progressing Goal: Respiratory complications will improve Outcome: Progressing Goal: Cardiovascular complication will be avoided Outcome: Progressing   Problem: Activity: Goal: Risk for activity intolerance will decrease Outcome: Progressing  Problem: Nutrition: Goal: Adequate nutrition will be maintained Outcome: Progressing   Problem: Coping: Goal: Level of anxiety will decrease Outcome: Progressing   Problem: Elimination: Goal: Will not experience complications related to bowel motility Outcome: Progressing Goal: Will not experience complications related to urinary retention Outcome: Progressing

## 2023-06-13 NOTE — Inpatient Diabetes Management (Signed)
Inpatient Diabetes Program Recommendations  AACE/ADA: New Consensus Statement on Inpatient Glycemic Control (2015)  Target Ranges:  Prepandial:   less than 140 mg/dL      Peak postprandial:   less than 180 mg/dL (1-2 hours)      Critically ill patients:  140 - 180 mg/dL   Lab Results  Component Value Date   GLUCAP 269 (H) 06/13/2023   HGBA1C 11.7 (H) 05/28/2023    Review of Glycemic Control  Diabetes history: DM2 Outpatient Diabetes medications: Lantus 22 units at bedtime, Humalog 6 units TID Current orders for Inpatient glycemic control: Semglee 38 units every day, Novolog 0-15 TID with meals and 0-5 HS + 6 units TID  HgbA1C - 11.7% 223, 211, 269, 181 mg/dL   Inpatient Diabetes Program Recommendations:    Consider increasing Novolog to 8 units TID with meals if eating > 50%  Spoke with pt at bedside regarding her diabetes and HgbA1C of 11.7%. Pt states she takes her insulin daily, does not miss doses and denies any hypoglycemia. States she checks blood sugars "most of the time." Discussed HgbA1C and glucose goals. Pt mentioned wanting to change PCPs if she could. Has no problem in obtaining insulin and supplies. Uses insulin pens. Discussed importance of checking CBGs and maintaining good CBG control to prevent long-term and short-term complications. Explained how hyperglycemia leads to damage within blood vessels which lead to the common complications seen with uncontrolled diabetes. Stressed to the patient the importance of improving glycemic control to prevent further complications from uncontrolled diabetes. Discussed impact of nutrition, exercise, stress, sickness, and medications on diabetes control.  Discussed carbohydrate choices, portion control and choosing high fiber options. Pt had no questions and voices understanding.   Continue to follow.  Thank you. Ailene Ards, RD, LDN, CDCES Inpatient Diabetes Coordinator [phone]

## 2023-06-13 NOTE — Progress Notes (Signed)
Gastroenterology Inpatient Follow Up    Subjective: Patient denies any abdominal pain currently.  Objective: Vital signs in last 24 hours: Temp:  [98.1 F (36.7 C)-98.4 F (36.9 C)] 98.1 F (36.7 C) (07/30 1601) Pulse Rate:  [99-120] 107 (07/30 1601) Resp:  [19-20] 19 (07/30 1601) BP: (118-133)/(67-78) 121/67 (07/30 1601) SpO2:  [94 %-98 %] 94 % (07/30 1601) Last BM Date : 06/12/23  Intake/Output from previous day: 07/29 0701 - 07/30 0700 In: 480 [P.O.:480] Out: -  Intake/Output this shift: Total I/O In: 220 [P.O.:220] Out: -   General appearance: alert and cooperative Resp: no increased WOB Cardio: mildly tachycardic GI: non-tender, non-distended  Lab Results: Recent Labs    06/11/23 0036 06/12/23 0323  WBC 19.9* 11.5*  HGB 10.1* 9.8*  HCT 33.1* 32.1*  PLT 283 262   BMET Recent Labs    06/11/23 0036 06/12/23 0323 06/13/23 0020  NA 134* 133* 132*  K 3.8 3.9 4.4  CL 100 98 96*  CO2 20* 21* 26  GLUCOSE 153* 195* 252*  BUN 15 17 19   CREATININE 1.17* 0.98 0.92  CALCIUM 7.6* 8.1* 8.4*   LFT Recent Labs    06/12/23 0323 06/13/23 0020  PROT 5.8* 5.9*  ALBUMIN 2.5* 2.3*  AST 943* 244*  ALT 895* 635*  ALKPHOS 258* 289*  BILITOT 4.1* 4.6*  BILIDIR 2.6*  --    PT/INR Recent Labs    06/12/23 0939 06/13/23 0020  LABPROT 33.9* 25.1*  INR 3.3* 2.2*   Hepatitis Panel No results for input(s): "HEPBSAG", "HCVAB", "HEPAIGM", "HEPBIGM" in the last 72 hours. C-Diff No results for input(s): "CDIFFTOX" in the last 72 hours.  Studies/Results: No results found.  Medications: I have reviewed the patient's current medications. Scheduled:  budesonide (PULMICORT) nebulizer solution  0.25 mg Nebulization BID   enoxaparin (LOVENOX) injection  40 mg Subcutaneous Q24H   famotidine  40 mg Oral Daily   insulin aspart  0-15 Units Subcutaneous TID WC   insulin aspart  0-5 Units Subcutaneous QHS   insulin aspart  6 Units Subcutaneous TID WC   [START ON  06/14/2023] insulin glargine-yfgn  38 Units Subcutaneous Daily   loratadine  10 mg Oral Daily   montelukast  10 mg Oral QHS   pantoprazole  40 mg Oral Daily   thiamine  100 mg Oral Daily   Continuous:  phytonadione (VITAMIN K) 10 mg in dextrose 5 % 50 mL IVPB     ZOX:WRUEAVWUJ, alum & mag hydroxide-simeth, morphine injection, ondansetron **OR** ondansetron (ZOFRAN) IV  Assessment/Plan: 52 year old female with history of diabetes, asthma, obesity s/p lap band s/p removal, and GERD presented with AMS, found to have metabolic acidosis and respiratory alkalosis with uncontrolled blood sugars. We were consulted for elevated LFTs.  LFTs continue to remain elevated with total bilirubin and alkaline phosphatase increasing today.  Her AST and ALT have decreased from yesterday.  INR has improved from 3.3 yesterday to 2.2. Causes of her elevated LFTs could include ischemic hepatitis (patient had some hypotension on 7/27 that could have led to her subsequent elevation LFTs on 7/28), DILI (recently received CTX due to concern for urosepsis), infection, or autoimmune hepatitis.  AMA and ASMA were negative.  CMV IgM was also negative.  Because her cholestatic liver test are increasing, will plan for MRI abdomen/MRCP with contrast for further evaluation. Causes of her elevated LFTs could include ischemic hepatitis (patient had some hypotension on 7/27 that could have led to her subsequent elevation LFTs on 7/28),  DILI (recently received CTX due to concern for urosepsis), CBD obstruction, or infection.  - Continue to trend LFTs - Follow up MRI abd/MRCP w/contrast - Cont IV Vit K repletion   LOS: 5 days   Imogene Burn 06/13/2023, 4:22 PM

## 2023-06-13 NOTE — Progress Notes (Signed)
PROGRESS NOTE    Frances Rodgers  WUJ:[id number] DOB: 12-23-1970 DOA: 06/07/2023 PCP: Tollie Eth, NP    Brief Narrative:   Frances Rodgers is a 52 y.o. female with medical history significant of mild intermittent asthma,, recently diagnosed new onset of IDDM brought in by family member for evaluation of mentation changes.  LFTs elevated.   GI consulted for help with diagnosis.     Assessment and Plan:  Acute metabolic encephalopathy -Probably secondary to profound acid-base abnormalities with combined acute metabolic acidosis and respiratory alkalosis -UDS negative, and patient was treated for UTI recently, UA showed no signs of infection, chest x-ray showed no acute infiltrates, will monitor off antibiotics. -Thyroid function was checked recently -delirium precautions -monitor mental status- appears improved   Acute transaminitis -Unknown etiology, ultrasound showed no obstructive gallstones without signs of acute cholecystitis -no acute findings on CT scan -viral hepatitis panel negative -avoid tylenol. Avoid hypotension ?NASH -labs pending:One possible etiology is DI LI/drug-induced liver injury i.e. ceftriaxone Or other viral etiologies i.e. CMV or EBV will also rule out underlying autoimmune hepatitis. -asked GI to see as INR increased/LFTs up still but now trending down   Leukocytosis -unclear etiology -monitor  AKI -Likely secondary to poor oral intake and dehydration -resolved   IDDM with starvation ketoacidosis -adjust insulin as able   Peripheral edema -DVT negative -echo done -albumin low   Chronic, mild intermittent asthma -No acute concern, continue ICS and as needed albuterol  Obesity Estimated body mass index is 37.42 kg/m as calculated from the following:   Height as of 05/27/23: 5\' 5"  (1.651 m).   Weight as of 05/27/23: 102 kg.    DVT prophylaxis: enoxaparin (LOVENOX) injection 40 mg Start: 06/07/23 2000    Code Status: Full  Code   Disposition Plan:  Level of care: Telemetry Medical Status is: inpt    Consultants:  GI   Subjective: Asking about going home  Objective: Vitals:   06/13/23 0200 06/13/23 0528 06/13/23 0819 06/13/23 1133  BP:  128/78  118/71  Pulse: 99 (!) 120 (!) 118 (!) 106  Resp:  20  19  Temp:  98.4 F (36.9 C)  98.1 F (36.7 C)  TempSrc:      SpO2:  96% 98% 98%    Intake/Output Summary (Last 24 hours) at 06/13/2023 1343 Last data filed at 06/13/2023 0800 Gross per 24 hour  Intake 460 ml  Output --  Net 460 ml   There were no vitals filed for this visit.  Examination:   General: Appearance:    Obese female in no acute distress     Lungs:     respirations unlabored  Heart:    Tachycardic.  + edemaLE   MS:   All extremities are intact.    Neurologic:   Awake, alert- flat affect       Data Reviewed: I have personally reviewed following labs and imaging studies  CBC: Recent Labs  Lab 06/07/23 1152 06/07/23 1158 06/09/23 0242 06/10/23 0232 06/11/23 0036 06/12/23 0323  WBC 14.4*  --  11.1* 16.0* 19.9* 11.5*  NEUTROABS 11.7*  --   --   --   --   --   HGB 10.8* 12.6 10.6* 9.5* 10.1* 9.8*  HCT 36.1 37.0 34.7* 30.8* 33.1* 32.1*  MCV 77.5*  --  76.6* 78.0* 78.8* 76.6*  PLT 397  --  274 292 283 262   Basic Metabolic Panel: Recent Labs  Lab 06/09/23 0242 06/10/23 0232 06/11/23 0036  06/12/23 0323 06/13/23 0020  NA 134* 134* 134* 133* 132*  K 3.7 3.5 3.8 3.9 4.4  CL 102 101 100 98 96*  CO2 24 22 20* 21* 26  GLUCOSE 328* 189* 153* 195* 252*  BUN 20 11 15 17 19   CREATININE 0.87 0.68 1.17* 0.98 0.92  CALCIUM 8.7* 8.0* 7.6* 8.1* 8.4*   GFR: CrCl cannot be calculated (Unknown ideal weight.). Liver Function Tests: Recent Labs  Lab 06/08/23 1122 06/09/23 0242 06/11/23 0036 06/12/23 0323 06/13/23 0020  AST 81* 75* 1,434* 943* 244*  ALT 528* 417* 813* 895* 635*  ALKPHOS 320* 299* 313* 258* 289*  BILITOT 1.0 0.9 3.5* 4.1* 4.6*  PROT 6.6 6.7 5.9*  5.8* 5.9*  ALBUMIN 2.0* 2.0* 1.7* 2.5* 2.3*   Recent Labs  Lab 06/07/23 2150 06/11/23 0036  LIPASE 141* 64*   Recent Labs  Lab 06/07/23 1133 06/08/23 1855  AMMONIA 39* 22   Coagulation Profile: Recent Labs  Lab 06/07/23 2150 06/12/23 0939 06/13/23 0020  INR 1.4* 3.3* 2.2*   Cardiac Enzymes: No results for input(s): "CKTOTAL", "CKMB", "CKMBINDEX", "TROPONINI" in the last 168 hours. BNP (last 3 results) No results for input(s): "PROBNP" in the last 8760 hours. HbA1C: No results for input(s): "HGBA1C" in the last 72 hours. CBG: Recent Labs  Lab 06/12/23 1312 06/12/23 1652 06/12/23 2124 06/13/23 0724 06/13/23 1135  GLUCAP 264* 237* 246* 223* 211*   Lipid Profile: No results for input(s): "CHOL", "HDL", "LDLCALC", "TRIG", "CHOLHDL", "LDLDIRECT" in the last 72 hours.  Thyroid Function Tests: No results for input(s): "TSH", "T4TOTAL", "FREET4", "T3FREE", "THYROIDAB" in the last 72 hours. Anemia Panel: No results for input(s): "VITAMINB12", "FOLATE", "FERRITIN", "TIBC", "IRON", "RETICCTPCT" in the last 72 hours. Sepsis Labs: Recent Labs  Lab 06/07/23 1158 06/07/23 1445 06/09/23 0242  LATICACIDVEN 2.3* 2.3* 1.9    No results found for this or any previous visit (from the past 240 hour(s)).        Radiology Studies: No results found.      Scheduled Meds:  budesonide (PULMICORT) nebulizer solution  0.25 mg Nebulization BID   enoxaparin (LOVENOX) injection  40 mg Subcutaneous Q24H   famotidine  40 mg Oral Daily   insulin aspart  0-15 Units Subcutaneous TID WC   insulin aspart  0-5 Units Subcutaneous QHS   insulin aspart  6 Units Subcutaneous TID WC   [START ON 06/14/2023] insulin glargine-yfgn  38 Units Subcutaneous Daily   loratadine  10 mg Oral Daily   montelukast  10 mg Oral QHS   pantoprazole  40 mg Oral Daily   thiamine  100 mg Oral Daily   Continuous Infusions:  phytonadione (VITAMIN K) 10 mg in dextrose 5 % 50 mL IVPB         LOS: 5  days    Time spent: 4 minutes spent on chart review, discussion with nursing staff, consultants, updating family and interview/physical exam; more than 50% of that time was spent in counseling and/or coordination of care.    Joseph Art, DO [facility] Available via Epic secure chat 7am-7pm After these hours, please refer to coverage provider listed on [email or web address] 06/13/2023, 1:43 PM

## 2023-06-14 ENCOUNTER — Inpatient Hospital Stay (HOSPITAL_COMMUNITY): Payer: 59

## 2023-06-14 DIAGNOSIS — R7401 Elevation of levels of liver transaminase levels: Secondary | ICD-10-CM | POA: Diagnosis not present

## 2023-06-14 DIAGNOSIS — R410 Disorientation, unspecified: Secondary | ICD-10-CM

## 2023-06-14 DIAGNOSIS — K838 Other specified diseases of biliary tract: Secondary | ICD-10-CM | POA: Diagnosis not present

## 2023-06-14 DIAGNOSIS — R7989 Other specified abnormal findings of blood chemistry: Secondary | ICD-10-CM | POA: Diagnosis not present

## 2023-06-14 DIAGNOSIS — R935 Abnormal findings on diagnostic imaging of other abdominal regions, including retroperitoneum: Secondary | ICD-10-CM | POA: Diagnosis not present

## 2023-06-14 LAB — GLUCOSE, CAPILLARY
Glucose-Capillary: 200 mg/dL — ABNORMAL HIGH (ref 70–99)
Glucose-Capillary: 190 mg/dL — ABNORMAL HIGH (ref 70–99)
Glucose-Capillary: 231 mg/dL — ABNORMAL HIGH (ref 70–99)
Glucose-Capillary: 201 mg/dL — ABNORMAL HIGH (ref 70–99)

## 2023-06-14 MED ORDER — INSULIN ASPART 100 UNIT/ML IJ SOLN
8.0000 [IU] | Freq: Three times a day (TID) | INTRAMUSCULAR | Status: DC
  Administered 2023-06-14 – 2023-06-15 (×3): 8 [IU] via SUBCUTANEOUS

## 2023-06-14 MED ORDER — INSULIN GLARGINE-YFGN 100 UNIT/ML ~~LOC~~ SOLN
40.0000 [IU] | Freq: Every day | SUBCUTANEOUS | Status: DC
  Administered 2023-06-15: 40 [IU] via SUBCUTANEOUS
  Filled 2023-06-14: qty 0.4

## 2023-06-14 MED ORDER — IOHEXOL 9 MG/ML PO SOLN
1000.0000 mL | ORAL | Status: DC
  Administered 2023-06-14: 1000 mL via ORAL

## 2023-06-14 MED ORDER — IOHEXOL 350 MG/ML SOLN
75.0000 mL | Freq: Once | INTRAVENOUS | Status: AC | PRN
  Administered 2023-06-14: 75 mL via INTRAVENOUS

## 2023-06-14 NOTE — Progress Notes (Signed)
Gastroenterology Inpatient Follow Up    Subjective: Patient states that she has had pain in the left upper quadrant when she first arrived to the hospital on 7/24.  She denies any pain currently.  Denies any significant NSAID use in the past.  She has been able to tolerate oral intake without issues.  Objective: Vital signs in last 24 hours: Temp:  [97.9 F (36.6 C)-98.3 F (36.8 C)] 98.3 F (36.8 C) (07/31 0922) Pulse Rate:  [106-115] 115 (07/31 0922) Resp:  [17-20] 20 (07/31 0922) BP: (114-147)/(63-74) 147/63 (07/31 0922) SpO2:  [94 %-100 %] 100 % (07/31 0922) Last BM Date : 06/12/23  Intake/Output from previous day: 07/30 0701 - 07/31 0700 In: 510 [P.O.:460; IV Piggyback:50] Out: 0  Intake/Output this shift: No intake/output data recorded.  General appearance: alert and cooperative Resp: no increased WOB Cardio: mildly tachycardic GI: non-tender, non-distended  Lab Results: Recent Labs    06/12/23 0323 06/14/23 0405  WBC 11.5* 6.9  HGB 9.8* 10.8*  HCT 32.1* 34.8*  PLT 262 268   BMET Recent Labs    06/12/23 0323 06/13/23 0020 06/14/23 0405  NA 133* 132* 133*  K 3.9 4.4 4.4  CL 98 96* 92*  CO2 21* 26 27  GLUCOSE 195* 252* 237*  BUN 17 19 21*  CREATININE 0.98 0.92 0.84  CALCIUM 8.1* 8.4* 9.0   LFT Recent Labs    06/12/23 0323 06/13/23 0020 06/14/23 0405  PROT 5.8*   < > 6.6  ALBUMIN 2.5*   < > 2.4*  AST 943*   < > 93*  ALT 895*   < > 422*  ALKPHOS 258*   < > 303*  BILITOT 4.1*   < > 4.9*  BILIDIR 2.6*  --   --    < > = values in this interval not displayed.   PT/INR Recent Labs    06/13/23 0020 06/14/23 0405  LABPROT 25.1* 18.0*  INR 2.2* 1.5*   Hepatitis Panel No results for input(s): "HEPBSAG", "HCVAB", "HEPAIGM", "HEPBIGM" in the last 72 hours. C-Diff No results for input(s): "CDIFFTOX" in the last 72 hours.  Studies/Results: MR ABDOMEN MRCP W WO CONTAST  Result Date: 06/14/2023 CLINICAL DATA:  New diagnosis of diabetes.   Elevated LFTs. EXAM: MRI ABDOMEN WITHOUT AND WITH CONTRAST (INCLUDING MRCP) TECHNIQUE: Multiplanar multisequence MR imaging of the abdomen was performed both before and after the administration of intravenous contrast. Heavily T2-weighted images of the biliary and pancreatic ducts were obtained, and three-dimensional MRCP images were rendered by post processing. CONTRAST:  10mL GADAVIST GADOBUTROL 1 MMOL/ML IV SOLN COMPARISON:  CT AP 06/08/2023 and U/S abdomen 06/07/2023 FINDINGS: Lower chest: Trace left pleural fluid. Scar versus atelectasis noted in both lung bases. Hepatobiliary: Hepatic steatosis. There is diminished exam detail on the postcontrast images due to motion artifact. No focal liver abnormality identified. Contour of the liver is slightly irregular. Multiple stones are identified within the lumen of the gallbladder which measure up to 6 mm. No gallbladder wall thickening or pericholecystic inflammation. There is mild intrahepatic bile duct dilatation. Focal area proximal common bile duct narrowing is identified just beyond the confluence of the bile ducts, image 23/5 and image 1 of series 1000. Beyond this level the CBD has a normal caliber measuring 4 mm. No choledocholithiasis identified. Pancreas:  No pancreatic inflammation, mass or main duct dilatation. Spleen: Spleen measures 2.6 cm in cranial caudal dimension. No focal splenic lesion. Adrenals/Urinary Tract: Normal adrenal glands. No kidney mass or hydronephrosis.  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 Within the left upper quadrant of the abdomen, lateral to the stomach, there is a collection of gas containing air-fluid level measuring 9.2 x 7.0 by 7.2 cm. The visualized bowel loops have a normal caliber. No bowel wall thickening or inflammation. Vascular/Lymphatic: Normal caliber abdominal aorta. No adenopathy identified. Other:  Trace perihepatic and perisplenic fluid.  Musculoskeletal: No suspicious bone lesions identified. IMPRESSION: 1. Within the left upper quadrant of the abdomen, lateral to the stomach, there is a 9.2 x 7.0 x 7.2 cm collection of gas and fluid. Etiology is indeterminate. Cannot rule out contained perforation or abscess. Recommend further evaluation with dedicated CT of the abdomen with IV contrast material and oral contrast material. 2. Mild intrahepatic bile duct dilatation with focal area of narrowing involving the proximal common bile duct just beyond the confluence of the bile ducts. This may be due to a stricture. In the setting of clinical signs/symptoms of biliary obstruction further evaluation with ERCP may be indicated. No signs of choledocholithiasis. 3. Cholelithiasis without evidence of acute cholecystitis. 4. Hepatic steatosis. 5. Trace perihepatic and perisplenic fluid. 6. Trace left pleural fluid. These results will be called to the ordering clinician or representative by the Radiologist Assistant, and communication documented in the PACS or [employer]. Electronically Signed   By: Signa Kell M.D.   On: 06/14/2023 05:42    Medications: I have reviewed the patient's current medications. Scheduled:  budesonide (PULMICORT) nebulizer solution  0.25 mg Nebulization BID   enoxaparin (LOVENOX) injection  40 mg Subcutaneous Q24H   famotidine  40 mg Oral Daily   insulin aspart  0-15 Units Subcutaneous TID WC   insulin aspart  0-5 Units Subcutaneous QHS   insulin aspart  8 Units Subcutaneous TID WC   [START ON 06/15/2023] insulin glargine-yfgn  40 Units Subcutaneous Daily   iohexol  1,000 mL Oral Q1H   loratadine  10 mg Oral Daily   montelukast  10 mg Oral QHS   pantoprazole  40 mg Oral Daily   thiamine  100 mg Oral Daily   Continuous:   ZOX:WRUEAVWUJ, alum & mag hydroxide-simeth, morphine injection, ondansetron **OR** ondansetron (ZOFRAN) IV  Assessment/Plan: 52 year old female with history of diabetes, asthma, obesity s/p  lap band s/p removal, and GERD presented with AMS, found to have metabolic acidosis and respiratory alkalosis with uncontrolled blood sugars. We were consulted for elevated LFTs. Causes of her elevated LFTs could include ischemic hepatitis (patient had some hypotension on 7/27 that could have led to her subsequent elevation LFTs on 7/28), DILI (recently received CTX due to concern for urosepsis), CBD obstruction, or infection.  MRI/MRCP showed a 9 cm collection of gas and fluid lateral to the stomach in the left upper quadrant of the abdomen as well as mild intrahepatic bile duct dilatation with a focal area of narrowing involving the proximal CBD that could be concerning for stricture.  Alkaline phosphatase and total bilirubin remain elevated.  AST and ALT are continuing to downtrend.  INR continues to improve to 1.5 today.  We will plan for further evaluation of the gas/fluid collection in the left upper quadrant by performing CT A/P with IV and oral contrast  - Continue to trend LFTs - Follow up CT A/P w/IV and PO contrast - Pending results of CT scan, can decide whether or not it would be safe to pursue further evaluation of the mildly dilated CBD.   LOS: 6 days   Nicole Kindred  Alfredo Martinez 06/14/2023, 2:25 PM

## 2023-06-14 NOTE — Progress Notes (Signed)
Frances Rodgers  UXL:[id number] DOB: [birth year] DOA: 06/07/2023 PCP: Tollie Eth, NP    Brief Narrative:  52 year old with a history of mild intermittent asthma and recently diagnosed DM who was brought to the ER by a family member with altered mental status.  Workup revealed elevated LFTs.  Goals of Care:   Code Status: Full Code   DVT prophylaxis: enoxaparin (LOVENOX) injection 40 mg Start: 06/07/23 2000  Interim Hx: Afebrile.  Some tachycardia with heart rate up to 119.  Blood pressure stable.  Oxygen saturation 100% on room air.  Feeling much better today.  No new complaints.  Reports improving appetite.  Assessment & Plan:  Acute metabolic encephalopathy of unclear etiology Possibly related to profound acid-base abnormalities/acute metabolic acidosis/respiratory alkalosis -UDS negative -UA without evidence of UTI -CXR with no clear infiltrate -thyroid function recently confirmed to be within normal limits -has resolved at this time  Acute transaminitis Unclear etiology -US revealed no obstructive gallstones and no suggestion of acute cholecystitis -CT abdomen 7/25 without acute findings -viral hepatitis panel negative -possible shock liver versus drug-induced liver injury -AMA and ASMA negative -CMV IgM negative -MRCP 7/21 reveals a 9.2 x 7.0 x 7.2 cm collection of gas and fluid in the left upper quadrant lateral to the stomach of indeterminate etiology -repeat CT abdomen is pending to further evaluate -GI is following  Recent Labs  Lab 06/09/23 0242 06/11/23 0036 06/12/23 0323 06/13/23 0020 06/14/23 0405  AST 75* 1,434* 943* 244* 93*  ALT 417* 813* 895* 635* 422*  ALKPHOS 299* 313* 258* 289* 303*  BILITOT 0.9 3.5* 4.1* 4.6* 4.9*  PROT 6.7 5.9* 5.8* 5.9* 6.6  ALBUMIN 2.0* 1.7* 2.5* 2.3* 2.4*    Acute kidney injury Felt to be due to poor intake/dehydration -now resolved  DM2 with starvation ketoacidosis CBG elevated at 211-269 -adjust treatment and follow  Peripheral  edema Venous duplex negative for DVT  Chronic mild intermittent asthma Stable at present  Obesity - There is no height or weight on file to calculate BMI.  Status post lap band and subsequent removal   Family Communication:  Disposition:     Objective: Blood pressure (!) 147/63, pulse (!) 115, temperature 98.3 F (36.8 C), temperature source Oral, resp. rate 20, SpO2 100%.  Intake/Output Summary (Last 24 hours) at 06/14/2023 1009 Last data filed at 06/14/2023 0548 Gross per 24 hour  Intake 290 ml  Output 0 ml  Net 290 ml   There were no vitals filed for this visit.  Examination: General: No acute respiratory distress Lungs: Clear to auscultation bilaterally without wheezes or crackles Cardiovascular: Regular rate and rhythm without murmur gallop or rub normal S1 and S2 Abdomen: Nontender, nondistended, soft, bowel sounds positive, no rebound, no ascites, no appreciable mass Extremities: No significant cyanosis, clubbing, or edema bilateral lower extremities  CBC: Recent Labs  Lab 06/07/23 1152 06/07/23 1158 06/11/23 0036 06/12/23 0323 06/14/23 0405  WBC 14.4*   < > 19.9* 11.5* 6.9  NEUTROABS 11.7*  --   --   --   --   HGB 10.8*   < > 10.1* 9.8* 10.8*  HCT 36.1   < > 33.1* 32.1* 34.8*  MCV 77.5*   < > 78.8* 76.6* 78.9*  PLT 397   < > 283 262 268   < > = values in this interval not displayed.   Basic Metabolic Panel: Recent Labs  Lab 06/12/23 0323 06/13/23 0020 06/14/23 0405  NA 133* 132* 133*  K 3.9  4.4 4.4  CL 98 96* 92*  CO2 21* 26 27  GLUCOSE 195* 252* 237*  BUN 17 19 21*  CREATININE 0.98 0.92 0.84  CALCIUM 8.1* 8.4* 9.0   GFR: CrCl cannot be calculated (Unknown ideal weight.).   Scheduled Meds:  budesonide (PULMICORT) nebulizer solution  0.25 mg Nebulization BID   enoxaparin (LOVENOX) injection  40 mg Subcutaneous Q24H   famotidine  40 mg Oral Daily   insulin aspart  0-15 Units Subcutaneous TID WC   insulin aspart  0-5 Units Subcutaneous QHS    insulin aspart  6 Units Subcutaneous TID WC   insulin glargine-yfgn  38 Units Subcutaneous Daily   loratadine  10 mg Oral Daily   montelukast  10 mg Oral QHS   pantoprazole  40 mg Oral Daily   thiamine  100 mg Oral Daily      LOS: 6 days   Lonia Blood, MD [facility] Office  [phone] Pager - Text Page per [name]  If 7PM-7AM, please contact night-coverage per Amion 06/14/2023, 10:09 AM

## 2023-06-15 ENCOUNTER — Other Ambulatory Visit: Payer: Self-pay

## 2023-06-15 ENCOUNTER — Encounter (HOSPITAL_COMMUNITY): Payer: Self-pay | Admitting: Internal Medicine

## 2023-06-15 ENCOUNTER — Inpatient Hospital Stay (HOSPITAL_COMMUNITY): Payer: 59

## 2023-06-15 DIAGNOSIS — R7401 Elevation of levels of liver transaminase levels: Secondary | ICD-10-CM | POA: Diagnosis not present

## 2023-06-15 DIAGNOSIS — R4182 Altered mental status, unspecified: Secondary | ICD-10-CM

## 2023-06-15 DIAGNOSIS — E872 Acidosis, unspecified: Secondary | ICD-10-CM | POA: Diagnosis not present

## 2023-06-15 DIAGNOSIS — R935 Abnormal findings on diagnostic imaging of other abdominal regions, including retroperitoneum: Secondary | ICD-10-CM | POA: Diagnosis not present

## 2023-06-15 DIAGNOSIS — E873 Alkalosis: Secondary | ICD-10-CM | POA: Diagnosis not present

## 2023-06-15 DIAGNOSIS — K838 Other specified diseases of biliary tract: Secondary | ICD-10-CM | POA: Diagnosis not present

## 2023-06-15 DIAGNOSIS — R7989 Other specified abnormal findings of blood chemistry: Secondary | ICD-10-CM | POA: Diagnosis not present

## 2023-06-15 LAB — GLUCOSE, CAPILLARY
Glucose-Capillary: 101 mg/dL — ABNORMAL HIGH (ref 70–99)
Glucose-Capillary: 128 mg/dL — ABNORMAL HIGH (ref 70–99)
Glucose-Capillary: 191 mg/dL — ABNORMAL HIGH (ref 70–99)
Glucose-Capillary: 210 mg/dL — ABNORMAL HIGH (ref 70–99)
Glucose-Capillary: 85 mg/dL (ref 70–99)

## 2023-06-15 MED ORDER — INSULIN ASPART 100 UNIT/ML IJ SOLN: 0.0000 [IU] | INTRAMUSCULAR | Status: AC

## 2023-06-15 MED ORDER — PANTOPRAZOLE SODIUM 40 MG IV SOLR
40.0000 mg | Freq: Two times a day (BID) | INTRAVENOUS | Status: DC
  Administered 2023-06-15 – 2023-06-25 (×22): 40 mg via INTRAVENOUS
  Filled 2023-06-15 (×23): qty 10

## 2023-06-15 MED ORDER — PANTOPRAZOLE SODIUM 40 MG IV SOLR: 40.0000 mg | INTRAVENOUS | Status: DC

## 2023-06-15 MED ORDER — IOHEXOL 300 MG/ML  SOLN
100.0000 mL | Freq: Once | INTRAMUSCULAR | Status: AC | PRN
  Administered 2023-06-15: 100 mL via ORAL

## 2023-06-15 MED ORDER — ORAL CARE MOUTH RINSE: 15.0000 mL | OROMUCOSAL | Status: DC | PRN

## 2023-06-15 NOTE — Progress Notes (Addendum)
PT Cancellation Note  Patient Details Name: Frances Rodgers MRN: [id number] DOB: Jan 10, 1971   Cancelled Treatment:    Reason Eval/Treat Not Completed: (P) Patient declined, no reason specified (Pt c/o frustration today and defers, note plan for procedure.) Will continue efforts next date per PT plan of care as schedule permits.   Marven Veley M Elfida Shimada 06/15/2023, 2:10 PM

## 2023-06-15 NOTE — Consult Note (Signed)
Consult Note  DENIM MAILLARD 1971-05-05  [id number].    Requesting MD: Eulah Pont, MD Chief Complaint/Reason for Consult:  contained gastric perforation   HPI:  Patient is a 52 yo black female who was recently admitted and diagnosed with new onset DM.  She was found to be in DKA, AKI, etc.  She seemed to resolve and was discharged home.  During that time, she underwent a CT of the chest to rule out PE.  Incidentally, in review, the findings of this contained perforation were likely present at that time.    She represented to the ED on 7/24 after her daughter found her down at home.  She was readmitted again DKA, acute transaminitis felt to be secondary to ischemic hepatitis likely (this is improving), AKI.  She was started on treated for these things.  At some point, as the patient isn't a great historian, she developed abdominal pain which she states is in her RUQ.  Given her elevated LFTs she underwent a CT scan that was read as normal.  She does have a history of a lap gastric sleeve in 2015 by Dr. Alva Garnet at [facility].  She then underwent a MRCP which revealed a 9cm air fluid collection in the LUQ c/w possible contained leak of her gastric staple line.  A new CT scan was then ordered to confirm this.  This was in agreement with her MRI.  Her WBC remains normal and she is AF.  She has been eating a solid diet with no vomiting, only intermittent nausea.  She is moving her bowels well with no blood present.  She denies LUQ abdominal pain or previous abdominal pain before either admission.  She denies use of NSAIDs, tobacco, ETOH, ASA, etc and has had no issues that she is aware of since her surgery in 2015.  She then underwent an UGI today that revealed a gastric leak into this fluid collection.  We have been asked to see her for further recommendations.  ROS: Negative other than HPI  Family History  Problem Relation Age of Onset   Diabetes Mother    Stroke Mother    Diabetes  Brother    Stroke Brother    Pancreatic cancer Maternal Grandmother     Past Medical History:  Diagnosis Date   Allergy    Anemia    Asthma    Blood transfusion without reported diagnosis 2023   GERD (gastroesophageal reflux disease)    Seasonal allergies     Past Surgical History:  Procedure Laterality Date   GASTRIC BYPASS  2015   NASAL POLYP SURGERY  2010   WISDOM TOOTH EXTRACTION      Social History:  reports that she has never smoked. She has never used smokeless tobacco. She reports that she does not drink alcohol and does not use drugs.  Allergies:  Allergies  Allergen Reactions   Aspirin Anaphylaxis and Shortness Of Breath   Ketorolac Anaphylaxis   Ketorolac Tromethamine Shortness Of Breath    Medications Prior to Admission  Medication Sig Dispense Refill   albuterol (VENTOLIN HFA) 108 (90 Base) MCG/ACT inhaler Inhale 2 puffs into the lungs every 6 (six) hours as needed for wheezing or shortness of breath.     Cetirizine HCl 10 MG CAPS Take 10 mg by mouth daily as needed (allergy).     FLOVENT DISKUS 50 MCG/ACT AEPB Take 1 Inhalation by mouth every 12 (twelve) hours as needed (SOB).  insulin glargine (LANTUS) 100 UNIT/ML Solostar Pen Inject 22 Units into the skin at bedtime. May substitute as needed per insurance. (Patient taking differently: Inject 22 Units into the skin at bedtime.) 6.6 mL 2   insulin lispro (HUMALOG) 100 UNIT/ML KwikPen Inject 6 Units into the skin 3 (three) times daily with meals. Only take if eating a meal AND Blood Glucose (BG) is 80 or higher. (Patient taking differently: Inject 6 Units into the skin 3 (three) times daily with meals.) 5.4 mL 2   JUNEL FE 24 1-20 MG-MCG(24) tablet TAKE 1 TABLET BY MOUTH DAILY (Patient taking differently: Take 1 tablet by mouth at bedtime.) 84 tablet 3   montelukast (SINGULAIR) 10 MG tablet Take 10 mg by mouth at bedtime.     Multiple Vitamin (MULTI VITAMIN DAILY PO) Take 1 tablet by mouth daily.      pantoprazole (PROTONIX) 40 MG tablet Take 40 mg by mouth daily as needed (acid reflex).     QVAR REDIHALER 40 MCG/ACT inhaler Inhale 1 puff into the lungs 2 (two) times daily.     Blood Glucose Monitoring Suppl DEVI 1 each by Does not apply route 3 (three) times daily. May dispense any manufacturer covered by patient's insurance. 1 each 0   Glucose Blood (BLOOD GLUCOSE TEST STRIPS) STRP 1 each by Does not apply route 3 (three) times daily. Use as directed to check blood sugar. May dispense any manufacturer covered by patient's insurance and fits patient's device. 100 strip 0   Insulin Pen Needle (PEN NEEDLES) 31G X 5 MM MISC 1 each by Does not apply route 3 (three) times daily. May dispense any manufacturer covered by patient's insurance. 100 each 0   Lancet Device MISC 1 each by Does not apply route 3 (three) times daily. May dispense any manufacturer covered by patient's insurance. 1 each 0   Lancets MISC 1 each by Does not apply route 3 (three) times daily. Use as directed to check blood sugar. May dispense any manufacturer covered by patient's insurance and fits patient's device. 100 each 0    Blood pressure (!) 126/58, pulse (!) 109, temperature 97.6 F (36.4 C), temperature source Oral, resp. rate 18, SpO2 96%. Physical Exam:  General: pleasant female who is laying in bed in NAD HEENT: head is normocephalic, atraumatic.  Sclera are noninjected.  PERRL.  Ears and nose without any masses or lesions.  Mouth is pink and moist Heart: regular rhythm, but mildly tachy.  Normal s1,s2. No obvious murmurs, gallops, or rubs noted.   Lungs: CTAB, no wheezes, rhonchi, or rales noted.  Respiratory effort nonlabored Abd: soft, NT, except mildly in the RUQ, but no guarding or rebounding, ND/obese, +BS, no masses, hernias, or organomegaly Psych: A&Ox3 with a very flat affect.   Results for orders placed or performed during the hospital encounter of 06/07/23 (from the past 48 hour(s))  Glucose, capillary      Status: Abnormal   Collection Time: 06/13/23 11:35 AM  Result Value Ref Range   Glucose-Capillary 211 (H) 70 - 99 mg/dL    Comment: Glucose reference range applies only to samples taken after fasting for at least 8 hours.  Glucose, capillary     Status: Abnormal   Collection Time: 06/13/23  4:03 PM  Result Value Ref Range   Glucose-Capillary 269 (H) 70 - 99 mg/dL    Comment: Glucose reference range applies only to samples taken after fasting for at least 8 hours.  Glucose, capillary     Status:  Abnormal   Collection Time: 06/13/23  9:00 PM  Result Value Ref Range   Glucose-Capillary 181 (H) 70 - 99 mg/dL    Comment: Glucose reference range applies only to samples taken after fasting for at least 8 hours.  Protime-INR     Status: Abnormal   Collection Time: 06/14/23  4:05 AM  Result Value Ref Range   Prothrombin Time 18.0 (H) 11.4 - 15.2 seconds   INR 1.5 (H) 0.8 - 1.2    Comment: (NOTE) INR goal varies based on device and disease states. Performed at [facility], [address], [place], [place] [address]   Comprehensive metabolic panel     Status: Abnormal   Collection Time: 06/14/23  4:05 AM  Result Value Ref Range   Sodium 133 (L) 135 - 145 mmol/L   Potassium 4.4 3.5 - 5.1 mmol/L   Chloride 92 (L) 98 - 111 mmol/L   CO2 27 22 - 32 mmol/L   Glucose, Bld 237 (H) 70 - 99 mg/dL    Comment: Glucose reference range applies only to samples taken after fasting for at least 8 hours.   BUN 21 (H) 6 - 20 mg/dL   Creatinine, Ser 6.04 0.44 - 1.00 mg/dL   Calcium 9.0 8.9 - 54.0 mg/dL   Total Protein 6.6 6.5 - 8.1 g/dL   Albumin 2.4 (L) 3.5 - 5.0 g/dL   AST 93 (H) 15 - 41 U/L   ALT 422 (H) 0 - 44 U/L   Alkaline Phosphatase 303 (H) 38 - 126 U/L   Total Bilirubin 4.9 (H) 0.3 - 1.2 mg/dL   GFR, Estimated >98 >11 mL/min    Comment: (NOTE) Calculated using the CKD-EPI Creatinine Equation (2021)    Anion gap 14 5 - 15    Comment: Performed at [facility], [address], [place], [place] [address]  CBC     Status: Abnormal   Collection Time: 06/14/23  4:05 AM  Result Value Ref Range   WBC 6.9 4.0 - 10.5 K/uL   RBC 4.41 3.87 - 5.11 MIL/uL   Hemoglobin 10.8 (L) 12.0 - 15.0 g/dL   HCT 29.5 (L) 62.1 - 30.8 %   MCV 78.9 (L) 80.0 - 100.0 fL   MCH 24.5 (L) 26.0 - 34.0 pg   MCHC 31.0 30.0 - 36.0 g/dL   RDW 65.7 (H) 84.6 - 96.2 %   Platelets 268 150 - 400 K/uL   nRBC 0.3 (H) 0.0 - 0.2 %    Comment: Performed at [facility], [address], [place], [place] [address]  Glucose, capillary     Status: Abnormal   Collection Time: 06/14/23  7:31 AM  Result Value Ref Range   Glucose-Capillary 200 (H) 70 - 99 mg/dL    Comment: Glucose reference range applies only to samples taken after fasting for at least 8 hours.  Glucose, capillary     Status: Abnormal   Collection Time: 06/14/23 11:18 AM  Result Value Ref Range   Glucose-Capillary 190 (H) 70 - 99 mg/dL    Comment: Glucose reference range applies only to samples taken after fasting for at least 8 hours.  Glucose, capillary     Status: Abnormal   Collection Time: 06/14/23  4:03 PM  Result Value Ref Range   Glucose-Capillary 231 (H) 70 - 99 mg/dL    Comment: Glucose reference range applies only to samples taken after fasting for at least 8 hours.  Glucose, capillary  Status: Abnormal   Collection Time: 06/14/23  9:10 PM  Result Value Ref Range   Glucose-Capillary 201 (H) 70 - 99 mg/dL    Comment: Glucose reference range applies only to samples taken after fasting for at least 8 hours.  Comprehensive metabolic panel     Status: Abnormal   Collection Time: 06/15/23  2:37 AM  Result Value Ref Range   Sodium 130 (L) 135 - 145 mmol/L   Potassium 3.8 3.5 - 5.1 mmol/L   Chloride 96 (L) 98 - 111 mmol/L   CO2 24 22 - 32 mmol/L   Glucose, Bld 192 (H) 70 - 99 mg/dL    Comment: Glucose reference range applies only to samples taken after fasting for at least 8 hours.   BUN 15 6 - 20 mg/dL   Creatinine, Ser  0.34 0.44 - 1.00 mg/dL   Calcium 8.3 (L) 8.9 - 10.3 mg/dL   Total Protein 5.8 (L) 6.5 - 8.1 g/dL   Albumin 2.0 (L) 3.5 - 5.0 g/dL   AST 55 (H) 15 - 41 U/L   ALT 237 (H) 0 - 44 U/L   Alkaline Phosphatase 263 (H) 38 - 126 U/L   Total Bilirubin 3.7 (H) 0.3 - 1.2 mg/dL   GFR, Estimated >74 >25 mL/min    Comment: (NOTE) Calculated using the CKD-EPI Creatinine Equation (2021)    Anion gap 10 5 - 15    Comment: Performed at [facility], [address], [place], [place] [address]  Protime-INR     Status: Abnormal   Collection Time: 06/15/23  2:37 AM  Result Value Ref Range   Prothrombin Time 18.9 (H) 11.4 - 15.2 seconds   INR 1.6 (H) 0.8 - 1.2    Comment: (NOTE) INR goal varies based on device and disease states. Performed at [facility], [address], [place], [place] [address]   CBC     Status: Abnormal   Collection Time: 06/15/23  2:37 AM  Result Value Ref Range   WBC 6.4 4.0 - 10.5 K/uL   RBC 3.73 (L) 3.87 - 5.11 MIL/uL   Hemoglobin 8.9 (L) 12.0 - 15.0 g/dL   HCT 32.9 (L) 51.8 - 84.1 %   MCV 78.6 (L) 80.0 - 100.0 fL   MCH 23.9 (L) 26.0 - 34.0 pg   MCHC 30.4 30.0 - 36.0 g/dL   RDW 66.0 (H) 63.0 - 16.0 %   Platelets 251 150 - 400 K/uL   nRBC 0.3 (H) 0.0 - 0.2 %    Comment: Performed at [facility], [address], [place], [place] [address]  Glucose, capillary     Status: Abnormal   Collection Time: 06/15/23  7:22 AM  Result Value Ref Range   Glucose-Capillary 191 (H) 70 - 99 mg/dL    Comment: Glucose reference range applies only to samples taken after fasting for at least 8 hours.   CT ABDOMEN PELVIS W CONTRAST  Result Date: 06/14/2023 CLINICAL DATA:  Abnormal MRI EXAM: CT ABDOMEN AND PELVIS WITH CONTRAST TECHNIQUE: Multidetector CT imaging of the abdomen and pelvis was performed using the standard protocol following bolus administration of intravenous contrast. RADIATION DOSE REDUCTION: This exam was performed according to the departmental  dose-optimization program which includes automated exposure control, adjustment of the mA and/or kV according to patient size and/or use of iterative reconstruction technique. CONTRAST:  75mL OMNIPAQUE IOHEXOL 350 MG/ML SOLN COMPARISON:  MRI 06/13/2023, CT 06/08/2023 FINDINGS: Lower chest: Lung bases demonstrate no  acute airspace disease. Linear atelectasis at the right base. Hepatobiliary: No focal liver abnormality is seen. No gallstones, gallbladder wall thickening, or biliary dilatation. Pancreas: Unremarkable. No pancreatic ductal dilatation or surrounding inflammatory changes. Spleen: Normal in size without focal abnormality. Adrenals/Urinary Tract: Adrenal glands are unremarkable. Kidneys are normal, without renal calculi, focal lesion, or hydronephrosis. Bladder is unremarkable. Stomach/Bowel: Status post gastric bypass. No evidence for bowel obstruction or bowel wall thickening. Vascular/Lymphatic: No significant vascular findings are present. No enlarged abdominal or pelvic lymph nodes. Reproductive: Enlarged uterus with numerous enhancing masses presumably fibroids. Other: Negative for pelvic effusion or free air. Large gas and fluid collection in the left upper quadrant measuring 10.1 x 7.9 cm lateral to the stomach. Musculoskeletal: No acute or significant osseous findings. IMPRESSION: 1. Large left upper quadrant gas and fluid collection measuring 10.1 cm lateral to the stomach, concerning for contained perforation/leak and or abscess. Collection is in close proximity to suture line from patient's previous gastric bypass surgery. 2. Enlarged uterus with multiple fibroids. Electronically Signed   By: Jasmine Pang M.D.   On: 06/14/2023 18:40   MR ABDOMEN MRCP W WO CONTAST  Result Date: 06/14/2023 CLINICAL DATA:  New diagnosis of diabetes.  Elevated LFTs. EXAM: MRI ABDOMEN WITHOUT AND WITH CONTRAST (INCLUDING MRCP) TECHNIQUE: Multiplanar multisequence MR imaging of the abdomen was performed both  before and after the administration of intravenous contrast. Heavily T2-weighted images of the biliary and pancreatic ducts were obtained, and three-dimensional MRCP images were rendered by post processing. CONTRAST:  10mL GADAVIST GADOBUTROL 1 MMOL/ML IV SOLN COMPARISON:  CT AP 06/08/2023 and U/S abdomen 06/07/2023 FINDINGS: Lower chest: Trace left pleural fluid. Scar versus atelectasis noted in both lung bases. Hepatobiliary: Hepatic steatosis. There is diminished exam detail on the postcontrast images due to motion artifact. No focal liver abnormality identified. Contour of the liver is slightly irregular. Multiple stones are identified within the lumen of the gallbladder which measure up to 6 mm. No gallbladder wall thickening or pericholecystic inflammation. There is mild intrahepatic bile duct dilatation. Focal area proximal common bile duct narrowing is identified just beyond the confluence of the bile ducts, image 23/5 and image 1 of series 1000. Beyond this level the CBD has a normal caliber measuring 4 mm. No choledocholithiasis identified. Pancreas:  No pancreatic inflammation, mass or main duct dilatation. Spleen: Spleen measures 2.6 cm in cranial caudal dimension. No focal splenic lesion. Adrenals/Urinary Tract: Normal adrenal glands. No kidney mass or hydronephrosis. 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 Within the left upper quadrant of the abdomen, lateral to the stomach, there is a collection of gas containing air-fluid level measuring 9.2 x 7.0 by 7.2 cm. The visualized bowel loops have a normal caliber. No bowel wall thickening or inflammation. Vascular/Lymphatic: Normal caliber abdominal aorta. No adenopathy identified. Other:  Trace perihepatic and perisplenic fluid. Musculoskeletal: No suspicious bone lesions identified. IMPRESSION: 1. Within the left upper quadrant of the abdomen, lateral to the stomach, there is a 9.2 x 7.0 x  7.2 cm collection of gas and fluid. Etiology is indeterminate. Cannot rule out contained perforation or abscess. Recommend further evaluation with dedicated CT of the abdomen with IV contrast material and oral contrast material. 2. Mild intrahepatic bile duct dilatation with focal area of narrowing involving the proximal common bile duct just beyond the confluence of the bile ducts. This may be due to a stricture. In the setting of clinical signs/symptoms of biliary obstruction further evaluation with ERCP may  be indicated. No signs of choledocholithiasis. 3. Cholelithiasis without evidence of acute cholecystitis. 4. Hepatic steatosis. 5. Trace perihepatic and perisplenic fluid. 6. Trace left pleural fluid. These results will be called to the ordering clinician or representative by the Radiologist Assistant, and communication documented in the PACS or [employer]. Electronically Signed   By: Signa Kell M.D.   On: 06/14/2023 05:42      Assessment/Plan Hx of prior gastric sleeve at [facility] in 2015 10 cm fluid collection adjacent to staple line secondary to a contained gastric perforation The patient has been seen, examined, labs, chart, vitals, and imaging personally reviewed.  The patient appears to have a contained perforation from her stomach, likely at her prior staple line from her gastric sleeve.  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  Given the high pressure nature of this anatomy, it can then cause a blow out leak in the upper portion of the stomach.  This collection has been present for at least several weeks as it was present even on her CT of her chest from 7/16.  It seems it was thought the patient may have had a gastric bypass and this collection was her remnant stomach.  In fact, she has just had a sleeve and she has no remnant stomach component.  The patient will need to be NPO and have discussed  with IR about drain placement.  Surgical intervention is not indicated right now as the ultimately treatment will be decompressing the narrowing with possible EGD/stenting and then after the leak heals possible conversion to a roux-en-y bypass.  The initial management of this plan has been discussed with the patient and she is agreeable.  I have also reached out to the primary service as well as GI to relay these recommendations.  It would also be reasonable to go ahead and place a picc line and start TNA as I suspect she will be NPO for a while.  I have also discussed with GI about future possibilities of EGD and possible stenting; however, there were some concerns here for possible migration etc.  This is not urgent to her care right now, so we will develop a better plan for this moving forward.  For now, we will start with NPO and IR drain.    FEN: NPO VTE: LMWH ID: no current abx  - per [facility] -  Asthma Recent dx of IDDM, DKA, resolved AKI, resolved Transaminitis, improving  I reviewed Consultant GI notes, hospitalist notes, last 24 h vitals and pain scores, last 48 h intake and output, last 24 h labs and trends, and last 24 h imaging results.   Letha Cape, [facility] 06/15/2023, 10:33 AM Please see Amion for pager number during day hours 7:00am-4:30pm

## 2023-06-15 NOTE — Progress Notes (Signed)
Gastroenterology Inpatient Follow Up    Subjective: Patient feels the same as she did yesterday.  Denies any abdominal pain.  Objective: Vital signs in last 24 hours: Temp:  [97.6 F (36.4 C)-98.6 F (37 C)] 97.6 F (36.4 C) (08/01 0942) Pulse Rate:  [98-109] 108 (08/01 1114) Resp:  [18-19] 18 (08/01 0942) BP: (121-139)/(58-76) 121/67 (08/01 1114) SpO2:  [96 %-100 %] 98 % (08/01 1114) Last BM Date : 06/14/23  Intake/Output from previous day: 07/31 0701 - 08/01 0700 In: 720 [P.O.:720] Out: 0  Intake/Output this shift: No intake/output data recorded.  General appearance: alert and cooperative Resp: no increased WOB Cardio: mildly tachycardic GI: non-tender, non-distended  Lab Results: Recent Labs    06/14/23 0405 06/15/23 0237  WBC 6.9 6.4  HGB 10.8* 8.9*  HCT 34.8* 29.3*  PLT 268 251   BMET Recent Labs    06/13/23 0020 06/14/23 0405 06/15/23 0237  NA 132* 133* 130*  K 4.4 4.4 3.8  CL 96* 92* 96*  CO2 26 27 24   GLUCOSE 252* 237* 192*  BUN 19 21* 15  CREATININE 0.92 0.84 0.62  CALCIUM 8.4* 9.0 8.3*   LFT Recent Labs    06/15/23 0237  PROT 5.8*  ALBUMIN 2.0*  AST 55*  ALT 237*  ALKPHOS 263*  BILITOT 3.7*   PT/INR Recent Labs    06/14/23 0405 06/15/23 0237  LABPROT 18.0* 18.9*  INR 1.5* 1.6*   Hepatitis Panel No results for input(s): "HEPBSAG", "HCVAB", "HEPAIGM", "HEPBIGM" in the last 72 hours. C-Diff No results for input(s): "CDIFFTOX" in the last 72 hours.  Studies/Results: DG UGI W SINGLE CM (SOL OR THIN BA)  Result Date: 06/15/2023 CLINICAL DATA:  52 year-old female with history of gastric sleeve in 2015, presents with RUQ pain. Left upper quadrant gas and fluid collection seen on cross-sectional imaging. Evaluate for leak/perforation. EXAM: DG UGI W SINGLE CM TECHNIQUE: Single contrast examination was then performed using 100 mL Omnipaque 300. This exam was performed by Loyce Dys PA-C, and was supervised and interpreted by  Malachi Pro, MD. FLUOROSCOPY: Radiation Exposure Index (as provided by the fluoroscopic device): 35.5 mGy Kerma COMPARISON:  CT Abdomen Pelvis 06/14/23 FINDINGS: [facility] Radiograph: Normal bowel gas pattern with oral contrast in the colon. Postsurgical changes of the stomach. Esophagus: Normal appearance. Esophageal motility: Tertiary contractions. Gastroesophageal reflux: None visualized. Ingested 13mm barium tablet: Not given. Stomach: Postsurgical changes from prior sleeve gastrectomy. Contrast is seen extravasating from the gastric fundus into the left upper quadrant collection. Gastric emptying: Normal. Duodenum: Normal appearance. Other:  None. IMPRESSION: 1. Findings consistent with leak/contained perforation of the gastric fundus, presumably due to suture dehiscence from prior sleeve gastrectomy. 2. Mild esophageal dysmotility. These results were called by telephone at the time of interpretation on 06/15/2023 at 11:03 am to provider [facility] PA, who verbally acknowledged these results. Electronically Signed   By: Obie Dredge M.D.   On: 06/15/2023 11:45   CT ABDOMEN PELVIS W CONTRAST  Result Date: 06/14/2023 CLINICAL DATA:  Abnormal MRI EXAM: CT ABDOMEN AND PELVIS WITH CONTRAST TECHNIQUE: Multidetector CT imaging of the abdomen and pelvis was performed using the standard protocol following bolus administration of intravenous contrast. RADIATION DOSE REDUCTION: This exam was performed according to the departmental dose-optimization program which includes automated exposure control, adjustment of the mA and/or kV according to patient size and/or use of iterative reconstruction technique. CONTRAST:  75mL OMNIPAQUE IOHEXOL 350 MG/ML SOLN COMPARISON:  MRI 06/13/2023, CT 06/08/2023 FINDINGS: Lower chest:  Lung bases demonstrate no acute airspace disease. Linear atelectasis at the right base. Hepatobiliary: No focal liver abnormality is seen. No gallstones, gallbladder wall thickening, or biliary  dilatation. Pancreas: Unremarkable. No pancreatic ductal dilatation or surrounding inflammatory changes. Spleen: Normal in size without focal abnormality. Adrenals/Urinary Tract: Adrenal glands are unremarkable. Kidneys are normal, without renal calculi, focal lesion, or hydronephrosis. Bladder is unremarkable. Stomach/Bowel: Status post gastric bypass. No evidence for bowel obstruction or bowel wall thickening. Vascular/Lymphatic: No significant vascular findings are present. No enlarged abdominal or pelvic lymph nodes. Reproductive: Enlarged uterus with numerous enhancing masses presumably fibroids. Other: Negative for pelvic effusion or free air. Large gas and fluid collection in the left upper quadrant measuring 10.1 x 7.9 cm lateral to the stomach. Musculoskeletal: No acute or significant osseous findings. IMPRESSION: 1. Large left upper quadrant gas and fluid collection measuring 10.1 cm lateral to the stomach, concerning for contained perforation/leak and or abscess. Collection is in close proximity to suture line from patient's previous gastric bypass surgery. 2. Enlarged uterus with multiple fibroids. Electronically Signed   By: Jasmine Pang M.D.   On: 06/14/2023 18:40   MR ABDOMEN MRCP W WO CONTAST  Result Date: 06/14/2023 CLINICAL DATA:  New diagnosis of diabetes.  Elevated LFTs. EXAM: MRI ABDOMEN WITHOUT AND WITH CONTRAST (INCLUDING MRCP) TECHNIQUE: Multiplanar multisequence MR imaging of the abdomen was performed both before and after the administration of intravenous contrast. Heavily T2-weighted images of the biliary and pancreatic ducts were obtained, and three-dimensional MRCP images were rendered by post processing. CONTRAST:  10mL GADAVIST GADOBUTROL 1 MMOL/ML IV SOLN COMPARISON:  CT AP 06/08/2023 and U/S abdomen 06/07/2023 FINDINGS: Lower chest: Trace left pleural fluid. Scar versus atelectasis noted in both lung bases. Hepatobiliary: Hepatic steatosis. There is diminished exam detail on the  postcontrast images due to motion artifact. No focal liver abnormality identified. Contour of the liver is slightly irregular. Multiple stones are identified within the lumen of the gallbladder which measure up to 6 mm. No gallbladder wall thickening or pericholecystic inflammation. There is mild intrahepatic bile duct dilatation. Focal area proximal common bile duct narrowing is identified just beyond the confluence of the bile ducts, image 23/5 and image 1 of series 1000. Beyond this level the CBD has a normal caliber measuring 4 mm. No choledocholithiasis identified. Pancreas:  No pancreatic inflammation, mass or main duct dilatation. Spleen: Spleen measures 2.6 cm in cranial caudal dimension. No focal splenic lesion. Adrenals/Urinary Tract: Normal adrenal glands. No kidney mass or hydronephrosis. 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 Within the left upper quadrant of the abdomen, lateral to the stomach, there is a collection of gas containing air-fluid level measuring 9.2 x 7.0 by 7.2 cm. The visualized bowel loops have a normal caliber. No bowel wall thickening or inflammation. Vascular/Lymphatic: Normal caliber abdominal aorta. No adenopathy identified. Other:  Trace perihepatic and perisplenic fluid. Musculoskeletal: No suspicious bone lesions identified. IMPRESSION: 1. Within the left upper quadrant of the abdomen, lateral to the stomach, there is a 9.2 x 7.0 x 7.2 cm collection of gas and fluid. Etiology is indeterminate. Cannot rule out contained perforation or abscess. Recommend further evaluation with dedicated CT of the abdomen with IV contrast material and oral contrast material. 2. Mild intrahepatic bile duct dilatation with focal area of narrowing involving the proximal common bile duct just beyond the confluence of the bile ducts. This may be due to a stricture. In the setting of clinical signs/symptoms of biliary obstruction further  evaluation with ERCP may be indicated. No signs of choledocholithiasis. 3. Cholelithiasis without evidence of acute cholecystitis. 4. Hepatic steatosis. 5. Trace perihepatic and perisplenic fluid. 6. Trace left pleural fluid. These results will be called to the ordering clinician or representative by the Radiologist Assistant, and communication documented in the PACS or [employer]. Electronically Signed   By: Signa Kell M.D.   On: 06/14/2023 05:42    Medications: I have reviewed the patient's current medications. Scheduled:  budesonide (PULMICORT) nebulizer solution  0.25 mg Nebulization BID   enoxaparin (LOVENOX) injection  40 mg Subcutaneous Q24H   famotidine  40 mg Oral Daily   insulin aspart  0-15 Units Subcutaneous TID WC   insulin aspart  0-5 Units Subcutaneous QHS   insulin aspart  8 Units Subcutaneous TID WC   insulin glargine-yfgn  40 Units Subcutaneous Daily   loratadine  10 mg Oral Daily   montelukast  10 mg Oral QHS   pantoprazole (PROTONIX) IV  40 mg Intravenous Q12H   thiamine  100 mg Oral Daily   Continuous:   OZH:YQMVHQION, alum & mag hydroxide-simeth, morphine injection, ondansetron **OR** ondansetron (ZOFRAN) IV, mouth rinse  Assessment/Plan: 52 year old female with history of diabetes, asthma, obesity s/p gastric sleeve in 2015, and GERD presented with AMS, found to have metabolic acidosis and respiratory alkalosis with uncontrolled blood sugars. We were consulted for elevated LFTs. Causes of her elevated LFTs could include ischemic hepatitis (patient had some hypotension on 7/27 that could have led to her subsequent elevation LFTs on 7/28), DILI (patient received CTX for urosepsis recently), CBD stricture, and/or infection (perhaps related to recent findings of a contained perforation).  MRI/MRCP showed a 9 cm collection of gas and fluid lateral to the stomach in the left upper quadrant of the abdomen as well as mild intrahepatic bile duct dilatation with a focal  area of narrowing involving the proximal CBD that could be concerning for stricture.  CT A/P yesterday again showed a large fluid collection next to the stomach from her previous gastric sleeve surgery.  I discussed these findings with radiology and they recommend an upper GI series to see if this truly represents a perforation.  Per radiology her stomach may have had similar radiologic findings going back to 7/16.  Upper GI series indeed confirms a leak/contained perforation of the gastric fundus presumably due to suture dehiscence from prior sleeve gastrectomy.  Surgery has been consulted to assist with management of a perforated anastomosis.  I also discussed this case with my advanced GI colleagues Dr. Meridee Score and Dr. Marina Goodell.  If patient is still here by next week, Dr. Meridee Score could consider looking into her stomach via EGD and seeing if she would be a candidate for any GI interventions.  LFTs of the patient are downtrending.  Hopefully her LFTs will continue to downtrend over time, reducing the likelihood of a true biliary obstruction.  Certainly with the knowledge that she had a gastric perforation, any endoscopic intervention for a biliary stricture would be deferred.  If patient's LFTs were to increase again in the future, then would consider PBD placement.  - Continue to trend LFTs - Patient made NPO - Follow surgery recommendations   LOS: 7 days   Frances Rodgers 06/15/2023, 2:29 PM

## 2023-06-15 NOTE — Consult Note (Signed)
Chief Complaint: Patient was seen in consultation today for intra-abdominal fluid collection  Referring Physician(s): Dr.   Bo Mcclintock Physician: Irish Lack  Patient Status: [facility] - In-pt  History of Present Illness: Frances Rodgers is a 52 y.o. female asthma, GERD, with history of gastric sleeve 2015 who presents with several week history of abdominal pain. CT Abdomen Pelvis yesterday showed a large fluid collection adjacent to the stomach. UGI performed today showed contrast extravasation concerning for perforation.  Surgery has assessed and recommends IR consult for aspiration and drainage.   Case reviewed by Dr. Fredia Sorrow who approves patient for procedure.    Past Medical History:  Diagnosis Date   Allergy    Anemia    Asthma    Blood transfusion without reported diagnosis 2023   GERD (gastroesophageal reflux disease)    Seasonal allergies     Past Surgical History:  Procedure Laterality Date   GASTRIC BYPASS  2015   NASAL POLYP SURGERY  2010   WISDOM TOOTH EXTRACTION      Allergies: Aspirin, Ketorolac, and Ketorolac tromethamine  Medications: Prior to Admission medications   Medication Sig Start Date End Date Taking? Authorizing Provider  albuterol (VENTOLIN HFA) 108 (90 Base) MCG/ACT inhaler Inhale 2 puffs into the lungs every 6 (six) hours as needed for wheezing or shortness of breath.   Yes [provider]  Cetirizine HCl 10 MG CAPS Take 10 mg by mouth daily as needed (allergy). 06/02/03  Yes [provider]  FLOVENT DISKUS 50 MCG/ACT AEPB Take 1 Inhalation by mouth every 12 (twelve) hours as needed (SOB).   Yes [provider]  insulin glargine (LANTUS) 100 UNIT/ML Solostar Pen Inject 22 Units into the skin at bedtime. May substitute as needed per insurance. Patient taking differently: Inject 22 Units into the skin at bedtime. 06/01/23 08/30/23 Yes Darlin Priestly, MD  insulin lispro (HUMALOG) 100 UNIT/ML KwikPen Inject 6 Units into  the skin 3 (three) times daily with meals. Only take if eating a meal AND Blood Glucose (BG) is 80 or higher. Patient taking differently: Inject 6 Units into the skin 3 (three) times daily with meals. 06/01/23 08/30/23 Yes Darlin Priestly, MD  JUNEL FE 24 1-20 MG-MCG(24) tablet TAKE 1 TABLET BY MOUTH DAILY Patient taking differently: Take 1 tablet by mouth at bedtime. 02/14/23  Yes Hildred Laser, MD  montelukast (SINGULAIR) 10 MG tablet Take 10 mg by mouth at bedtime.   Yes [provider]  Multiple Vitamin (MULTI VITAMIN DAILY PO) Take 1 tablet by mouth daily.   Yes [provider]  pantoprazole (PROTONIX) 40 MG tablet Take 40 mg by mouth daily as needed (acid reflex). 04/12/19  Yes [provider]  QVAR REDIHALER 40 MCG/ACT inhaler Inhale 1 puff into the lungs 2 (two) times daily. 12/09/22  Yes [provider]  Blood Glucose Monitoring Suppl DEVI 1 each by Does not apply route 3 (three) times daily. May dispense any manufacturer covered by patient's insurance. 06/01/23   Darlin Priestly, MD  Glucose Blood (BLOOD GLUCOSE TEST STRIPS) STRP 1 each by Does not apply route 3 (three) times daily. Use as directed to check blood sugar. May dispense any manufacturer covered by patient's insurance and fits patient's device. 06/01/23   Darlin Priestly, MD  Insulin Pen Needle (PEN NEEDLES) 31G X 5 MM MISC 1 each by Does not apply route 3 (three) times daily. May dispense any manufacturer covered by patient's insurance. 06/01/23   Darlin Priestly, MD  Lancet Device MISC  1 each by Does not apply route 3 (three) times daily. May dispense any manufacturer covered by patient's insurance. 06/01/23   Darlin Priestly, MD  Lancets MISC 1 each by Does not apply route 3 (three) times daily. Use as directed to check blood sugar. May dispense any manufacturer covered by patient's insurance and fits patient's device. 06/01/23   Darlin Priestly, MD     Family History  Problem Relation Age of Onset   Diabetes Mother    Stroke Mother     Diabetes Brother    Stroke Brother    Pancreatic cancer Maternal Grandmother     Social History   Socioeconomic History   Marital status: Single    Spouse name: Not on file   Number of children: Not on file   Years of education: Not on file   Highest education level: Not on file  Occupational History   Not on file  Tobacco Use   Smoking status: Never   Smokeless tobacco: Never  Vaping Use   Vaping status: Never Used  Substance and Sexual Activity   Alcohol use: Never   Drug use: Never   Sexual activity: Not Currently    Birth control/protection: Pill  Other Topics Concern   Not on file  Social History Narrative   Not on file   Social Determinants of Health   Financial Resource Strain: Not on file  Food Insecurity: No Food Insecurity (05/31/2023)   Hunger Vital Sign    Worried About Running Out of Food in the Last Year: Never true    Ran Out of Food in the Last Year: Never true  Transportation Needs: No Transportation Needs (05/31/2023)   PRAPARE - Administrator, Civil Service (Medical): No    Lack of Transportation (Non-Medical): No  Physical Activity: Not on file  Stress: Not on file  Social Connections: Not on file     Review of Systems: A 12 point ROS discussed and pertinent positives are indicated in the HPI above.  All other systems are negative.  Review of Systems  Constitutional:  Negative for fatigue and fever.  Respiratory:  Negative for cough and shortness of breath.   Cardiovascular:  Negative for chest pain.  Gastrointestinal:  Positive for abdominal pain. Negative for nausea and vomiting.  Musculoskeletal:  Negative for back pain.  Psychiatric/Behavioral:  Negative for behavioral problems and confusion.     Vital Signs: BP 109/77 (BP Location: Left Arm)   Pulse (!) 101   Temp 98.8 F (37.1 C) (Oral)   Resp 18   LMP 06/08/2023   SpO2 97%   Physical Exam Vitals and nursing note reviewed.  Constitutional:      General: She  is not in acute distress.    Appearance: Normal appearance. She is not ill-appearing.  HENT:     Mouth/Throat:     Mouth: Mucous membranes are moist.     Pharynx: Oropharynx is clear.  Cardiovascular:     Rate and Rhythm: Normal rate and regular rhythm.  Pulmonary:     Effort: Pulmonary effort is normal. No respiratory distress.     Breath sounds: Normal breath sounds.  Abdominal:     General: Abdomen is flat. There is distension.     Palpations: Abdomen is soft.     Tenderness: There is abdominal tenderness.  Neurological:     General: No focal deficit present.     Mental Status: She is alert and oriented to person, place, and time. Mental status  is at baseline.  Psychiatric:        Mood and Affect: Mood normal.        Behavior: Behavior normal.        Thought Content: Thought content normal.        Judgment: Judgment normal.      MD Evaluation Airway: WNL Heart: WNL Abdomen: WNL Chest/ Lungs: WNL ASA  Classification: 3 Mallampati/Airway Score: Two   Imaging: [place] EKG SITE RITE  Result Date: 06/15/2023 If Site Rite image not attached, placement could not be confirmed due to current cardiac rhythm.  DG UGI W SINGLE CM (SOL OR THIN BA)  Result Date: 06/15/2023 CLINICAL DATA:  52 year-old female with history of gastric sleeve in 2015, presents with RUQ pain. Left upper quadrant gas and fluid collection seen on cross-sectional imaging. Evaluate for leak/perforation. EXAM: DG UGI W SINGLE CM TECHNIQUE: Single contrast examination was then performed using 100 mL Omnipaque 300. This exam was performed by Loyce Dys PA-C, and was supervised and interpreted by Malachi Pro, MD. FLUOROSCOPY: Radiation Exposure Index (as provided by the fluoroscopic device): 35.5 mGy Kerma COMPARISON:  CT Abdomen Pelvis 06/14/23 FINDINGS: Scout Radiograph: Normal bowel gas pattern with oral contrast in the colon. Postsurgical changes of the stomach. Esophagus: Normal appearance. Esophageal motility:  Tertiary contractions. Gastroesophageal reflux: None visualized. Ingested 13mm barium tablet: Not given. Stomach: Postsurgical changes from prior sleeve gastrectomy. Contrast is seen extravasating from the gastric fundus into the left upper quadrant collection. Gastric emptying: Normal. Duodenum: Normal appearance. Other:  None. IMPRESSION: 1. Findings consistent with leak/contained perforation of the gastric fundus, presumably due to suture dehiscence from prior sleeve gastrectomy. 2. Mild esophageal dysmotility. These results were called by telephone at the time of interpretation on 06/15/2023 at 11:03 am to provider [facility] PA, who verbally acknowledged these results. Electronically Signed   By: Obie Dredge M.D.   On: 06/15/2023 11:45   CT ABDOMEN PELVIS W CONTRAST  Result Date: 06/14/2023 CLINICAL DATA:  Abnormal MRI EXAM: CT ABDOMEN AND PELVIS WITH CONTRAST TECHNIQUE: Multidetector CT imaging of the abdomen and pelvis was performed using the standard protocol following bolus administration of intravenous contrast. RADIATION DOSE REDUCTION: This exam was performed according to the departmental dose-optimization program which includes automated exposure control, adjustment of the mA and/or kV according to patient size and/or use of iterative reconstruction technique. CONTRAST:  75mL OMNIPAQUE IOHEXOL 350 MG/ML SOLN COMPARISON:  MRI 06/13/2023, CT 06/08/2023 FINDINGS: Lower chest: Lung bases demonstrate no acute airspace disease. Linear atelectasis at the right base. Hepatobiliary: No focal liver abnormality is seen. No gallstones, gallbladder wall thickening, or biliary dilatation. Pancreas: Unremarkable. No pancreatic ductal dilatation or surrounding inflammatory changes. Spleen: Normal in size without focal abnormality. Adrenals/Urinary Tract: Adrenal glands are unremarkable. Kidneys are normal, without renal calculi, focal lesion, or hydronephrosis. Bladder is unremarkable. Stomach/Bowel: Status  post gastric bypass. No evidence for bowel obstruction or bowel wall thickening. Vascular/Lymphatic: No significant vascular findings are present. No enlarged abdominal or pelvic lymph nodes. Reproductive: Enlarged uterus with numerous enhancing masses presumably fibroids. Other: Negative for pelvic effusion or free air. Large gas and fluid collection in the left upper quadrant measuring 10.1 x 7.9 cm lateral to the stomach. Musculoskeletal: No acute or significant osseous findings. IMPRESSION: 1. Large left upper quadrant gas and fluid collection measuring 10.1 cm lateral to the stomach, concerning for contained perforation/leak and or abscess. Collection is in close proximity to suture line from patient's previous gastric bypass surgery. 2. Enlarged uterus  with multiple fibroids. Electronically Signed   By: Jasmine Pang M.D.   On: 06/14/2023 18:40   MR ABDOMEN MRCP W WO CONTAST  Result Date: 06/14/2023 CLINICAL DATA:  New diagnosis of diabetes.  Elevated LFTs. EXAM: MRI ABDOMEN WITHOUT AND WITH CONTRAST (INCLUDING MRCP) TECHNIQUE: Multiplanar multisequence MR imaging of the abdomen was performed both before and after the administration of intravenous contrast. Heavily T2-weighted images of the biliary and pancreatic ducts were obtained, and three-dimensional MRCP images were rendered by post processing. CONTRAST:  10mL GADAVIST GADOBUTROL 1 MMOL/ML IV SOLN COMPARISON:  CT AP 06/08/2023 and U/S abdomen 06/07/2023 FINDINGS: Lower chest: Trace left pleural fluid. Scar versus atelectasis noted in both lung bases. Hepatobiliary: Hepatic steatosis. There is diminished exam detail on the postcontrast images due to motion artifact. No focal liver abnormality identified. Contour of the liver is slightly irregular. Multiple stones are identified within the lumen of the gallbladder which measure up to 6 mm. No gallbladder wall thickening or pericholecystic inflammation. There is mild intrahepatic bile duct dilatation.  Focal area proximal common bile duct narrowing is identified just beyond the confluence of the bile ducts, image 23/5 and image 1 of series 1000. Beyond this level the CBD has a normal caliber measuring 4 mm. No choledocholithiasis identified. Pancreas:  No pancreatic inflammation, mass or main duct dilatation. Spleen: Spleen measures 2.6 cm in cranial caudal dimension. No focal splenic lesion. Adrenals/Urinary Tract: Normal adrenal glands. No kidney mass or hydronephrosis. 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 Within the left upper quadrant of the abdomen, lateral to the stomach, there is a collection of gas containing air-fluid level measuring 9.2 x 7.0 by 7.2 cm. The visualized bowel loops have a normal caliber. No bowel wall thickening or inflammation. Vascular/Lymphatic: Normal caliber abdominal aorta. No adenopathy identified. Other:  Trace perihepatic and perisplenic fluid. Musculoskeletal: No suspicious bone lesions identified. IMPRESSION: 1. Within the left upper quadrant of the abdomen, lateral to the stomach, there is a 9.2 x 7.0 x 7.2 cm collection of gas and fluid. Etiology is indeterminate. Cannot rule out contained perforation or abscess. Recommend further evaluation with dedicated CT of the abdomen with IV contrast material and oral contrast material. 2. Mild intrahepatic bile duct dilatation with focal area of narrowing involving the proximal common bile duct just beyond the confluence of the bile ducts. This may be due to a stricture. In the setting of clinical signs/symptoms of biliary obstruction further evaluation with ERCP may be indicated. No signs of choledocholithiasis. 3. Cholelithiasis without evidence of acute cholecystitis. 4. Hepatic steatosis. 5. Trace perihepatic and perisplenic fluid. 6. Trace left pleural fluid. These results will be called to the ordering clinician or representative by the Radiologist Assistant, and  communication documented in the PACS or [employer]. Electronically Signed   By: Signa Kell M.D.   On: 06/14/2023 05:42   VAS [place] LOWER EXTREMITY VENOUS (DVT)  Result Date: 06/08/2023  Lower Venous DVT Study Patient Name:  BERNETTE DEGEORGE  Date of Exam:   06/08/2023 Medical Rec #: [id number]        Accession #:    [id number] Date of Birth: Jul 27, 1971        Patient Gender: F Patient Age:   52 years Exam Location:  [facility] Procedure:      VAS [place] LOWER EXTREMITY VENOUS (DVT) Referring Phys: Mikey College --------------------------------------------------------------------------------  Indications: Bilateral thigh pain.  Comparison Study: Prior negative bilateral LEV done 05/30/23 Performing Technologist: Sonny Masters  Rosezetta Schlatter RVS  Examination Guidelines: A complete evaluation includes B-mode imaging, spectral Doppler, color Doppler, and power Doppler as needed of all accessible portions of each vessel. Bilateral testing is considered an integral part of a complete examination. Limited examinations for reoccurring indications may be performed as noted. The reflux portion of the exam is performed with the patient in reverse Trendelenburg.  +---------+---------------+---------+-----------+----------+--------------+ RIGHT    CompressibilityPhasicitySpontaneityPropertiesThrombus Aging +---------+---------------+---------+-----------+----------+--------------+ CFV      Full           Yes      Yes                                 +---------+---------------+---------+-----------+----------+--------------+ SFJ      Full                                                        +---------+---------------+---------+-----------+----------+--------------+ FV Prox  Full                                                        +---------+---------------+---------+-----------+----------+--------------+ FV Mid   Full                                                         +---------+---------------+---------+-----------+----------+--------------+ FV DistalFull                                                        +---------+---------------+---------+-----------+----------+--------------+ PFV      Full                                                        +---------+---------------+---------+-----------+----------+--------------+ POP      Full           Yes      Yes                                 +---------+---------------+---------+-----------+----------+--------------+ PTV      Full                                                        +---------+---------------+---------+-----------+----------+--------------+ PERO     Full                                                        +---------+---------------+---------+-----------+----------+--------------+   +---------+---------------+---------+-----------+----------+--------------+  LEFT     CompressibilityPhasicitySpontaneityPropertiesThrombus Aging +---------+---------------+---------+-----------+----------+--------------+ CFV      Full           Yes      Yes                                 +---------+---------------+---------+-----------+----------+--------------+ SFJ      Full                                                        +---------+---------------+---------+-----------+----------+--------------+ FV Prox  Full                                                        +---------+---------------+---------+-----------+----------+--------------+ FV Mid   Full                                                        +---------+---------------+---------+-----------+----------+--------------+ FV DistalFull                                                        +---------+---------------+---------+-----------+----------+--------------+ PFV      Full                                                         +---------+---------------+---------+-----------+----------+--------------+ POP      Full           Yes      Yes                                 +---------+---------------+---------+-----------+----------+--------------+ PTV      Full                                                        +---------+---------------+---------+-----------+----------+--------------+ PERO     Full                                                        +---------+---------------+---------+-----------+----------+--------------+     Summary: BILATERAL: - No evidence of deep vein thrombosis seen in the lower extremities, bilaterally. -No evidence of popliteal cyst, bilaterally. RIGHT: - Findings appear essentially unchanged compared to previous examination.  LEFT: - Findings appear essentially unchanged compared to previous examination.  *  See table(s) above for measurements and observations. Electronically signed by Gerarda Fraction on 06/08/2023 at 6:08:10 PM.    Final    ECHOCARDIOGRAM COMPLETE  Result Date: 06/08/2023    ECHOCARDIOGRAM REPORT   Patient Name:   KATALIYAH PRINZO Date of Exam: 06/08/2023 Medical Rec #:  [id number]       Height:       65.0 in Accession #:    [id number]      Weight:       224.9 lb Date of Birth:  January 28, 1971       BSA:          2.079 m Patient Age:    51 years        BP:           139/65 mmHg Patient Gender: F               HR:           108 bpm. Exam Location:  Inpatient Procedure: 2D Echo, Cardiac Doppler and Color Doppler Indications:    I50.31 Acute diastolic (congestive) heart failure  History:        Patient has no prior history of Echocardiogram examinations.                 Signs/Symptoms:Bacteremia and Altered Mental Status.  Sonographer:    Sheralyn Boatman RDCS Referring Phys: [phone] JESSICA U [facility]  Sonographer Comments: Technically difficult study due to poor echo windows and patient is obese. Image acquisition challenging due to patient body habitus. IMPRESSIONS  1. Hyperdynamic LV with  mild LVOT gradient.. Left ventricular ejection fraction, by estimation, is 70 to 75%. The left ventricle has hyperdynamic function. The left ventricle has no regional wall motion abnormalities. There is mild concentric left ventricular hypertrophy. Left ventricular diastolic parameters are consistent with Grade I diastolic dysfunction (impaired relaxation).  2. Right ventricular systolic function is normal. The right ventricular size is normal.  3. The mitral valve is normal in structure. Trivial mitral valve regurgitation. No evidence of mitral stenosis.  4. The aortic valve is tricuspid. There is mild calcification of the aortic valve. Aortic valve regurgitation is not visualized. Aortic valve sclerosis/calcification is present, without any evidence of aortic stenosis.  5. The inferior vena cava is normal in size with greater than 50% respiratory variability, suggesting right atrial pressure of 3 mmHg. Conclusion(s)/Recommendation(s): No evidence of valvular vegetations on this transthoracic echocardiogram. Consider a transesophageal echocardiogram to exclude infective endocarditis if clinically indicated. FINDINGS  Left Ventricle: Hyperdynamic LV with mild LVOT gradient. Left ventricular ejection fraction, by estimation, is 70 to 75%. The left ventricle has hyperdynamic function. The left ventricle has no regional wall motion abnormalities. The left ventricular internal cavity size was normal in size. There is mild concentric left ventricular hypertrophy. Left ventricular diastolic parameters are consistent with Grade I diastolic dysfunction (impaired relaxation). Right Ventricle: The right ventricular size is normal. No increase in right ventricular wall thickness. Right ventricular systolic function is normal. Left Atrium: Left atrial size was normal in size. Right Atrium: Right atrial size was normal in size. Pericardium: There is no evidence of pericardial effusion. Mitral Valve: The mitral valve is normal in  structure. Trivial mitral valve regurgitation. No evidence of mitral valve stenosis. Tricuspid Valve: The tricuspid valve is normal in structure. Tricuspid valve regurgitation is not demonstrated. No evidence of tricuspid stenosis. Aortic Valve: The aortic valve is tricuspid. There is mild calcification of the aortic valve. Aortic valve regurgitation is not  visualized. Aortic valve sclerosis/calcification is present, without any evidence of aortic stenosis. Aortic valve mean gradient measures 7.0 mmHg. Aortic valve peak gradient measures 13.0 mmHg. Aortic valve area, by VTI measures 2.27 cm. Pulmonic Valve: The pulmonic valve was normal in structure. Pulmonic valve regurgitation is not visualized. No evidence of pulmonic stenosis. Aorta: The aortic root is normal in size and structure. Venous: The inferior vena cava is normal in size with greater than 50% respiratory variability, suggesting right atrial pressure of 3 mmHg. IAS/Shunts: No atrial level shunt detected by color flow Doppler.  LEFT VENTRICLE PLAX 2D LVIDd:         3.80 cm     Diastology LVIDs:         2.00 cm     LV e' medial:    7.83 cm/s LV PW:         1.40 cm     LV E/e' medial:  9.2 LV IVS:        1.30 cm     LV e' lateral:   5.77 cm/s LVOT diam:     2.10 cm     LV E/e' lateral: 12.5 LV SV:         63 LV SV Index:   30 LVOT Area:     3.46 cm  LV Volumes (MOD) LV vol d, MOD A2C: 45.8 ml LV vol d, MOD A4C: 38.0 ml LV vol s, MOD A2C: 7.3 ml LV vol s, MOD A4C: 9.2 ml LV SV MOD A2C:     38.5 ml LV SV MOD A4C:     38.0 ml LV SV MOD BP:      36.2 ml RIGHT VENTRICLE             IVC RV S prime:     19.40 cm/s  IVC diam: 1.70 cm TAPSE (M-mode): 2.3 cm LEFT ATRIUM             Index        RIGHT ATRIUM          Index LA diam:        2.80 cm 1.35 cm/m   RA Area:     9.40 cm LA Vol (A2C):   23.1 ml 11.11 ml/m  RA Volume:   19.20 ml 9.23 ml/m LA Vol (A4C):   15.2 ml 7.31 ml/m LA Biplane Vol: 18.8 ml 9.04 ml/m  AORTIC VALVE AV Area (Vmax):    2.85 cm AV  Area (Vmean):   2.61 cm AV Area (VTI):     2.27 cm AV Vmax:           180.00 cm/s AV Vmean:          124.000 cm/s AV VTI:            0.279 m AV Peak Grad:      13.0 mmHg AV Mean Grad:      7.0 mmHg LVOT Vmax:         148.00 cm/s LVOT Vmean:        93.600 cm/s LVOT VTI:          0.183 m LVOT/AV VTI ratio: 0.66  AORTA Ao Root diam: 2.70 cm Ao Asc diam:  3.10 cm MITRAL VALVE MV Area (PHT): 4.49 cm    SHUNTS MV Decel Time: 169 msec    Systemic VTI:  0.18 m MV E velocity: 72.30 cm/s  Systemic Diam: 2.10 cm MV A velocity: 98.50 cm/s MV E/A ratio:  0.73 Daniel Bensimhon  MD Electronically signed by Arvilla Meres MD Signature Date/Time: 06/08/2023/3:01:11 PM    Final    CT ABDOMEN PELVIS W WO CONTRAST  Result Date: 06/08/2023 CLINICAL DATA:  Abdominal pain, acute, nonlocalized EXAM: CT ABDOMEN AND PELVIS WITHOUT AND WITH CONTRAST TECHNIQUE: Multidetector CT imaging of the abdomen and pelvis was performed following the standard protocol before and following the bolus administration of intravenous contrast. RADIATION DOSE REDUCTION: This exam was performed according to the departmental dose-optimization program which includes automated exposure control, adjustment of the mA and/or kV according to patient size and/or use of iterative reconstruction technique. CONTRAST:  75 mL Omnipaque 350 IV COMPARISON:  Right upper quadrant ultrasound 05/18/2023. FINDINGS: Lower chest: No acute abnormality Hepatobiliary: Gallstones seen on earlier ultrasound not appreciated by CT. No biliary ductal dilatation. Mild diffuse fatty infiltration of the liver. Pancreas: No focal abnormality or ductal dilatation. Spleen: No focal abnormality.  Normal size. Adrenals/Urinary Tract: No adrenal abnormality. No focal renal abnormality. No stones or hydronephrosis. Urinary bladder is unremarkable. Stomach/Bowel: Stomach, large and small bowel grossly unremarkable. Prior gastric sleeve. Vascular/Lymphatic: No evidence of aneurysm or adenopathy.  Reproductive: Enlarged uterus with numerous fibroids. Pedunculated fundal fibroid measures up to 6.3 cm. No adnexal mass. Other: No free fluid or free air. Musculoskeletal: No acute bony abnormality. IMPRESSION: Previously seen gallstones on ultrasound not visible by CT. Hepatic steatosis. Enlarged fibroid uterus. No acute findings. Electronically Signed   By: Charlett Nose M.D.   On: 06/08/2023 02:53   US Abdomen Limited RUQ (LIVER/GB)  Result Date: 06/07/2023 CLINICAL DATA:  [id number] AMS (altered mental status) [id number] Transaminitis [id number] EXAM: ULTRASOUND ABDOMEN LIMITED RIGHT UPPER QUADRANT COMPARISON:  None Available. FINDINGS: Gallbladder: Small volume layering gallstones/sludge noted. No abnormal wall thickening or pericholecystic fluid. Sonographic Murphy's sign could not be evaluated due to unresponsive patient. Common bile duct: Diameter: Less than 4 mm. No intra or extrahepatic bile duct dilation. Liver: There is increased hepatic echogenicity which reduces the sensitivity of ultrasound for the detection of focal masses. That being said, no focal mass is identified. Portal vein is patent on color Doppler imaging with normal direction of blood flow towards the liver. Other: None. IMPRESSION: 1. Cholelithiasis without sonographic evidence of acute cholecystitis. 2. Increased hepatic echogenicity, a nonspecific finding that is most commonly seen on the basis of steatosis in the absence of known liver disease. Electronically Signed   By: Jules Schick M.D.   On: 06/07/2023 15:30   CT Head Wo Contrast  Result Date: 06/07/2023 CLINICAL DATA:  Mental status change, unknown cause EXAM: CT HEAD WITHOUT CONTRAST TECHNIQUE: Contiguous axial images were obtained from the base of the skull through the vertex without intravenous contrast. RADIATION DOSE REDUCTION: This exam was performed according to the departmental dose-optimization program which includes automated exposure control, adjustment of the mA  and/or kV according to patient size and/or use of iterative reconstruction technique. COMPARISON:  05/27/2023 FINDINGS: Brain: No evidence of acute infarction, hemorrhage, hydrocephalus, extra-axial collection or mass lesion/mass effect. Vascular: No hyperdense vessel or unexpected calcification. Skull: Normal. Negative for fracture or focal lesion. Sinuses/Orbits: No acute finding. Other: None. IMPRESSION: No acute intracranial findings. Electronically Signed   By: Duanne Guess D.O.   On: 06/07/2023 12:44   DG Chest Portable 1 View  Result Date: 06/07/2023 CLINICAL DATA:  Altered mental status EXAM: PORTABLE CHEST 1 VIEW COMPARISON:  05/28/2023 FINDINGS: The heart size and mediastinal contours are within normal limits. Slightly low lung volumes. No focal airspace consolidation, pleural effusion,  or pneumothorax. The visualized skeletal structures are unremarkable. IMPRESSION: No active disease. Electronically Signed   By: Duanne Guess D.O.   On: 06/07/2023 12:41   US Venous Img Lower Bilateral (DVT)  Result Date: 05/30/2023 CLINICAL DATA:  Elevated D-dimer.  Evaluate for DVT. EXAM: BILATERAL LOWER EXTREMITY VENOUS DOPPLER ULTRASOUND TECHNIQUE: Gray-scale sonography with graded compression, as well as color Doppler and duplex ultrasound were performed to evaluate the lower extremity deep venous systems from the level of the common femoral vein and including the common femoral, femoral, profunda femoral, popliteal and calf veins including the posterior tibial, peroneal and gastrocnemius veins when visible. The superficial great saphenous vein was also interrogated. Spectral Doppler was utilized to evaluate flow at rest and with distal augmentation maneuvers in the common femoral, femoral and popliteal veins. COMPARISON:  None Available. FINDINGS: RIGHT LOWER EXTREMITY Common Femoral Vein: No evidence of thrombus. Normal compressibility, respiratory phasicity and response to augmentation.  Saphenofemoral Junction: No evidence of thrombus. Normal compressibility and flow on color Doppler imaging. Profunda Femoral Vein: No evidence of thrombus. Normal compressibility and flow on color Doppler imaging. Femoral Vein: No evidence of thrombus. Normal compressibility, respiratory phasicity and response to augmentation. Popliteal Vein: No evidence of thrombus. Normal compressibility, respiratory phasicity and response to augmentation. Calf Veins: No evidence of thrombus. Normal compressibility and flow on color Doppler imaging. Superficial Great Saphenous Vein: No evidence of thrombus. Normal compressibility. Other Findings:  None. LEFT LOWER EXTREMITY Common Femoral Vein: No evidence of thrombus. Normal compressibility, respiratory phasicity and response to augmentation. Saphenofemoral Junction: No evidence of thrombus. Normal compressibility and flow on color Doppler imaging. Profunda Femoral Vein: No evidence of thrombus. Normal compressibility and flow on color Doppler imaging. Femoral Vein: No evidence of thrombus. Normal compressibility, respiratory phasicity and response to augmentation. Popliteal Vein: No evidence of thrombus. Normal compressibility, respiratory phasicity and response to augmentation. Calf Veins: No evidence of thrombus. Normal compressibility and flow on color Doppler imaging. Superficial Great Saphenous Vein: No evidence of thrombus. Normal compressibility. Other Findings:  None. IMPRESSION: No evidence of DVT within either lower extremity. Electronically Signed   By: Simonne Come M.D.   On: 05/30/2023 15:04   CT Angio Chest Pulmonary Embolism (PE) W or WO Contrast  Result Date: 05/30/2023 CLINICAL DATA:  High probability of pulmonary embolism. EXAM: CT ANGIOGRAPHY CHEST WITH CONTRAST TECHNIQUE: Multidetector CT imaging of the chest was performed using the standard protocol during bolus administration of intravenous contrast. Multiplanar CT image reconstructions and MIPs were  obtained to evaluate the vascular anatomy. RADIATION DOSE REDUCTION: This exam was performed according to the departmental dose-optimization program which includes automated exposure control, adjustment of the mA and/or kV according to patient size and/or use of iterative reconstruction technique. CONTRAST:  75mL OMNIPAQUE IOHEXOL 350 MG/ML SOLN COMPARISON:  None Available. FINDINGS: Cardiovascular: Satisfactory opacification of the pulmonary arteries to the segmental level. No evidence of pulmonary embolism. Normal heart size. No pericardial effusion. Mediastinum/Nodes: No enlarged mediastinal, hilar, or axillary lymph nodes. Thyroid gland, trachea, and esophagus demonstrate no significant findings. Lungs/Pleura: No pneumothorax or pleural effusion is noted. Mild bilateral posterior basilar subsegmental atelectasis is noted. Upper Abdomen: Hepatic steatosis. Musculoskeletal: No chest wall abnormality. No acute or significant osseous findings. Review of the MIP images confirms the above findings. IMPRESSION: No definite evidence of pulmonary embolus. Mild bilateral posterior basilar subsegmental atelectasis. Hepatic steatosis. Electronically Signed   By: Lupita Raider M.D.   On: 05/30/2023 08:25   DG Chest [facility] [address]  Result Date: 05/28/2023 CLINICAL DATA:  Leukocytosis EXAM: PORTABLE CHEST 1 VIEW COMPARISON:  12/24/2013 FINDINGS: The heart size and mediastinal contours are within normal limits. Slightly low lung volumes. No focal airspace consolidation, pleural effusion, or pneumothorax. The visualized skeletal structures are unremarkable. IMPRESSION: No active disease. Electronically Signed   By: Duanne Guess D.O.   On: 05/28/2023 13:07   CT HEAD WO CONTRAST ( )  Result Date: 05/27/2023 CLINICAL DATA:  Unresponsive EXAM: CT HEAD WITHOUT CONTRAST TECHNIQUE: Contiguous axial images were obtained from the base of the skull through the vertex without intravenous contrast. RADIATION DOSE REDUCTION:  This exam was performed according to the departmental dose-optimization program which includes automated exposure control, adjustment of the mA and/or kV according to patient size and/or use of iterative reconstruction technique. COMPARISON:  None Available. FINDINGS: Brain: No acute territorial infarction, hemorrhage or intracranial mass. The ventricles are nonenlarged. Vascular: No hyperdense vessel or unexpected calcification. Skull: Normal. Negative for fracture or focal lesion. Sinuses/Orbits: No acute finding. Mucosal thickening and postsurgical changes at the maxillary sinuses. Other: None IMPRESSION: Negative.  No CT evidence for acute intracranial abnormality Electronically Signed   By: Jasmine Pang M.D.   On: 05/27/2023 21:53    Labs:  CBC: Recent Labs    06/11/23 0036 06/12/23 0323 06/14/23 0405 06/15/23 0237  WBC 19.9* 11.5* 6.9 6.4  HGB 10.1* 9.8* 10.8* 8.9*  HCT 33.1* 32.1* 34.8* 29.3*  PLT 283 262 268 251    COAGS: Recent Labs    06/12/23 0939 06/13/23 0020 06/14/23 0405 06/15/23 0237  INR 3.3* 2.2* 1.5* 1.6*    BMP: Recent Labs    06/12/23 0323 06/13/23 0020 06/14/23 0405 06/15/23 0237  NA 133* 132* 133* 130*  K 3.9 4.4 4.4 3.8  CL 98 96* 92* 96*  CO2 21* 26 27 24   GLUCOSE 195* 252* 237* 192*  BUN 17 19 21* 15  CALCIUM 8.1* 8.4* 9.0 8.3*  CREATININE 0.98 0.92 0.84 0.62  GFRNONAA >60 >60 >60 >60    LIVER FUNCTION TESTS: Recent Labs    06/12/23 0323 06/13/23 0020 06/14/23 0405 06/15/23 0237  BILITOT 4.1* 4.6* 4.9* 3.7*  AST 943* 244* 93* 55*  ALT 895* 635* 422* 237*  ALKPHOS 258* 289* 303* 263*  PROT 5.8* 5.9* 6.6 5.8*  ALBUMIN 2.5* 2.3* 2.4* 2.0*    TUMOR MARKERS: No results for input(s): "AFPTM", "CEA", "CA199", "CHROMGRNA" in the last 8760 hours.  Assessment and Plan: Intra-abdominal fluid collection Patient with history of gastric sleeve in 2015. Now admitted with generalized RUQ pain.  CT Abdomen Pelvis 7/25 initially read as  negative, however repeat imaging 7/31 further clarified a large left upper quadrant gas and fluid collection as separate from the stomach and in close proximity to her prior procedure.  IR consulted for aspiration and drainage of her abdominal fluid collection. Case reviewed by Dr. Fredia Sorrow who approves for the procedure.   Discussed with patient at bedside.  She is aware of the imaging findings as well as the recommendations from GI and GS services. She is agreeable to procedure tomorrow.   NPO already due to concern for gastric perf.  INR 1.6 this AM.  Will repeat tomorrow.   Risks and benefits discussed with the patient including bleeding, infection, damage to adjacent structures, bowel perforation/fistula connection, and sepsis.  All of the patient's questions were answered, patient is agreeable to proceed. Consent signed and in chart.   Thank you for this interesting consult.  I greatly enjoyed meeting  [name] and look forward to participating in their care.  A copy of this report was sent to the requesting provider on this date.  Electronically Signed: Hoyt Koch, PA 06/15/2023, 4:38 PM   I spent a total of 40 Minutes    in face to face in clinical consultation, greater than 50% of which was counseling/coordinating care for intra-abdominal fluid collection.

## 2023-06-15 NOTE — Progress Notes (Signed)
Frances Rodgers  UJW:[id number] DOB: [birth year] DOA: 06/07/2023 PCP: Tollie Eth, NP    Brief Narrative:  52 year old with a history of mild intermittent asthma and recently diagnosed DM who was brought to the ER by a family member with altered mental status.  Workup revealed elevated LFTs.  Goals of Care:   Code Status: Full Code   DVT prophylaxis: enoxaparin (LOVENOX) injection 40 mg Start: 06/07/23 2000  Interim Hx: No acute events recorded overnight.  Afebrile.  Some mild sinus tachycardia but otherwise vital signs stable.  The patient herself has no new complaints, and is anxious to be discharged home.  Unfortunately CT scan of the abdomen, obtained in follow-up of her MRCP, has confirmed an abnormal fluid collection lateral to her stomach which is requiring significant further evaluation.  Assessment & Plan:  Acute metabolic encephalopathy of unclear etiology Possibly related to profound acid-base abnormalities/acute metabolic acidosis/respiratory alkalosis - UDS negative -UA without evidence of UTI -CXR with no clear infiltrate -thyroid function recently confirmed to be within normal limits -has resolved at this time  Acute transaminitis Unclear etiology -US revealed no obstructive gallstones and no suggestion of acute cholecystitis -CT abdomen 7/25 without acute findings -viral hepatitis panel negative -possible shock liver versus drug-induced liver injury -AMA and ASMA negative -CMV IgM negative -MRCP 7/21 revealed a 9.2 x 7.0 x 7.2 cm collection of gas and fluid in the left upper quadrant lateral to the stomach of indeterminate etiology as well as mild intrahepatic bile duct dilatation with a focal area of narrowing involving the proximal common bile duct possibly representing a stricture -LFTs continue to improve and are now approaching normal  Recent Labs  Lab 06/11/23 0036 06/12/23 0323 06/13/23 0020 06/14/23 0405 06/15/23 0237  AST 1,434* 943* 244* 93* 55*  ALT 813*  895* 635* 422* 237*  ALKPHOS 313* 258* 289* 303* 263*  BILITOT 3.5* 4.1* 4.6* 4.9* 3.7*  PROT 5.9* 5.8* 5.9* 6.6 5.8*  ALBUMIN 1.7* 2.5* 2.3* 2.4* 2.0*    9.2 cm collection of gas and fluid left upper quadrant Incidentally noted on MRI abdomen -follow-up CT confirms this finding with appearance worrisome for possible contained perforation given proximity to previous suture line from gastric bypass procedure -General surgery now following and coordinating care with the GI team -initial plan is for n.p.o. status with initiation of TNA and IR drainage of the fluid collection  Acute kidney injury Felt to be due to poor intake/dehydration -now resolved  DM2 with starvation ketoacidosis CBG elevated at 211-269 -adjust treatment and follow  Peripheral edema Venous duplex negative for DVT  Chronic mild intermittent asthma Stable at present  Obesity - There is no height or weight on file to calculate BMI.  Status post lap band and subsequent removal   Family Communication: No family present at time of exam Disposition: Discharge home will be delayed   Objective: Blood pressure (!) 126/58, pulse (!) 109, temperature 97.6 F (36.4 C), temperature source Oral, resp. rate 18, SpO2 96%.  Intake/Output Summary (Last 24 hours) at 06/15/2023 0958 Last data filed at 06/15/2023 0900 Gross per 24 hour  Intake 720 ml  Output 0 ml  Net 720 ml   There were no vitals filed for this visit.  Examination: General: No acute respiratory distress Lungs: Clear to auscultation bilaterally without wheezes or crackles Cardiovascular: Regular rate and rhythm without murmur gallop or rub normal S1 and S2 Abdomen: Nontender, nondistended, soft, bowel sounds positive, no rebound, no ascites Extremities: No significant  cyanosis, clubbing, or edema bilateral lower extremities  CBC: Recent Labs  Lab 06/12/23 0323 06/14/23 0405 06/15/23 0237  WBC 11.5* 6.9 6.4  HGB 9.8* 10.8* 8.9*  HCT 32.1* 34.8* 29.3*   MCV 76.6* 78.9* 78.6*  PLT 262 268 251   Basic Metabolic Panel: Recent Labs  Lab 06/13/23 0020 06/14/23 0405 06/15/23 0237  NA 132* 133* 130*  K 4.4 4.4 3.8  CL 96* 92* 96*  CO2 26 27 24   GLUCOSE 252* 237* 192*  BUN 19 21* 15  CREATININE 0.92 0.84 0.62  CALCIUM 8.4* 9.0 8.3*   GFR: CrCl cannot be calculated (Unknown ideal weight.).   Scheduled Meds:  budesonide (PULMICORT) nebulizer solution  0.25 mg Nebulization BID   enoxaparin (LOVENOX) injection  40 mg Subcutaneous Q24H   famotidine  40 mg Oral Daily   insulin aspart  0-15 Units Subcutaneous TID WC   insulin aspart  0-5 Units Subcutaneous QHS   insulin aspart  8 Units Subcutaneous TID WC   insulin glargine-yfgn  40 Units Subcutaneous Daily   loratadine  10 mg Oral Daily   montelukast  10 mg Oral QHS   pantoprazole  40 mg Oral Daily   thiamine  100 mg Oral Daily      LOS: 7 days   Lonia Blood, MD [facility] Office  [phone] Pager - Text Page per [name]  If 7PM-7AM, please contact night-coverage per Amion 06/15/2023, 9:58 AM

## 2023-06-15 NOTE — Plan of Care (Signed)
Problem: Education: Goal: Individualized Educational Video(s) Outcome: Progressing   Problem: Cardiac: Goal: Ability to maintain an adequate cardiac output will improve Outcome: Progressing   Problem: Health Behavior/Discharge Planning: Goal: Ability to identify and utilize available resources and services will improve Outcome: Progressing Goal: Ability to manage health-related needs will improve Outcome: Progressing   Problem: Fluid Volume: Goal: Ability to achieve a balanced intake and output will improve Outcome: Progressing   Problem: Metabolic: Goal: Ability to maintain appropriate glucose levels will improve Outcome: Progressing   Problem: Nutritional: Goal: Maintenance of adequate nutrition will improve Outcome: Progressing Goal: Maintenance of adequate weight for body size and type will improve Outcome: Progressing   Problem: Respiratory: Goal: Will regain and/or maintain adequate ventilation Outcome: Progressing   Problem: Urinary Elimination: Goal: Ability to achieve and maintain adequate renal perfusion and functioning will improve Outcome: Progressing   Problem: Education: Goal: Ability to describe self-care measures that may prevent or decrease complications (Diabetes Survival Skills Education) will improve Outcome: Progressing Goal: Individualized Educational Video(s) Outcome: Progressing   Problem: Coping: Goal: Ability to adjust to condition or change in health will improve Outcome: Progressing   Problem: Fluid Volume: Goal: Ability to maintain a balanced intake and output will improve Outcome: Progressing   Problem: Health Behavior/Discharge Planning: Goal: Ability to identify and utilize available resources and services will improve Outcome: Progressing Goal: Ability to manage health-related needs will improve Outcome: Progressing   Problem: Metabolic: Goal: Ability to maintain appropriate glucose levels will improve Outcome: Progressing    Problem: Nutritional: Goal: Maintenance of adequate nutrition will improve Outcome: Progressing Goal: Progress toward achieving an optimal weight will improve Outcome: Progressing   Problem: Skin Integrity: Goal: Risk for impaired skin integrity will decrease Outcome: Progressing   Problem: Tissue Perfusion: Goal: Adequacy of tissue perfusion will improve Outcome: Progressing   Problem: Education: Goal: Ability to describe self-care measures that may prevent or decrease complications (Diabetes Survival Skills Education) will improve Outcome: Progressing Goal: Individualized Educational Video(s) Outcome: Progressing   Problem: Coping: Goal: Ability to adjust to condition or change in health will improve Outcome: Progressing   Problem: Fluid Volume: Goal: Ability to maintain a balanced intake and output will improve Outcome: Progressing   Problem: Health Behavior/Discharge Planning: Goal: Ability to identify and utilize available resources and services will improve Outcome: Progressing Goal: Ability to manage health-related needs will improve Outcome: Progressing   Problem: Metabolic: Goal: Ability to maintain appropriate glucose levels will improve Outcome: Progressing   Problem: Nutritional: Goal: Maintenance of adequate nutrition will improve Outcome: Progressing Goal: Progress toward achieving an optimal weight will improve Outcome: Progressing   Problem: Skin Integrity: Goal: Risk for impaired skin integrity will decrease Outcome: Progressing   Problem: Tissue Perfusion: Goal: Adequacy of tissue perfusion will improve Outcome: Progressing   Problem: Education: Goal: Knowledge of General Education information will improve Description: Including pain rating scale, medication(s)/side effects and non-pharmacologic comfort measures Outcome: Progressing   Problem: Health Behavior/Discharge Planning: Goal: Ability to manage health-related needs will  improve Outcome: Progressing   Problem: Clinical Measurements: Goal: Ability to maintain clinical measurements within normal limits will improve Outcome: Progressing Goal: Will remain free from infection Outcome: Progressing Goal: Diagnostic test results will improve Outcome: Progressing Goal: Respiratory complications will improve Outcome: Progressing Goal: Cardiovascular complication will be avoided Outcome: Progressing   Problem: Activity: Goal: Risk for activity intolerance will decrease Outcome: Progressing   Problem: Nutrition: Goal: Adequate nutrition will be maintained Outcome: Progressing   Problem: Coping: Goal: Level of anxiety  will decrease Outcome: Progressing   Problem: Elimination: Goal: Will not experience complications related to bowel motility Outcome: Progressing Goal: Will not experience complications related to urinary retention Outcome: Progressing

## 2023-06-16 ENCOUNTER — Other Ambulatory Visit: Payer: Self-pay

## 2023-06-16 ENCOUNTER — Inpatient Hospital Stay (HOSPITAL_COMMUNITY): Payer: 59

## 2023-06-16 DIAGNOSIS — E872 Acidosis, unspecified: Secondary | ICD-10-CM | POA: Diagnosis not present

## 2023-06-16 DIAGNOSIS — K255 Chronic or unspecified gastric ulcer with perforation: Secondary | ICD-10-CM

## 2023-06-16 DIAGNOSIS — E873 Alkalosis: Secondary | ICD-10-CM | POA: Diagnosis not present

## 2023-06-16 DIAGNOSIS — R7401 Elevation of levels of liver transaminase levels: Secondary | ICD-10-CM | POA: Diagnosis not present

## 2023-06-16 DIAGNOSIS — R4182 Altered mental status, unspecified: Secondary | ICD-10-CM | POA: Diagnosis not present

## 2023-06-16 LAB — GLUCOSE, CAPILLARY
Glucose-Capillary: 77 mg/dL (ref 70–99)
Glucose-Capillary: 95 mg/dL (ref 70–99)
Glucose-Capillary: 95 mg/dL (ref 70–99)
Glucose-Capillary: 113 mg/dL — ABNORMAL HIGH (ref 70–99)
Glucose-Capillary: 183 mg/dL — ABNORMAL HIGH (ref 70–99)
Glucose-Capillary: 249 mg/dL — ABNORMAL HIGH (ref 70–99)

## 2023-06-16 LAB — PROTIME-INR
INR: 1.4 — ABNORMAL HIGH (ref 0.8–1.2)
Prothrombin Time: 17.3 seconds — ABNORMAL HIGH (ref 11.4–15.2)

## 2023-06-16 LAB — AEROBIC/ANAEROBIC CULTURE W GRAM STAIN (SURGICAL/DEEP WOUND): Special Requests: NORMAL

## 2023-06-16 MED ORDER — CHLORHEXIDINE GLUCONATE CLOTH 2 % EX PADS
6.0000 | MEDICATED_PAD | Freq: Every day | CUTANEOUS | Status: DC
  Administered 2023-06-16 – 2023-06-26 (×11): 6 via TOPICAL

## 2023-06-16 MED ORDER — FENTANYL CITRATE (PF) 100 MCG/2ML IJ SOLN
INTRAMUSCULAR | Status: AC
  Filled 2023-06-16: qty 4

## 2023-06-16 MED ORDER — MIDAZOLAM HCL 2 MG/2ML IJ SOLN
INTRAMUSCULAR | Status: AC
  Filled 2023-06-16: qty 4

## 2023-06-16 MED ORDER — FENTANYL CITRATE (PF) 100 MCG/2ML IJ SOLN
INTRAMUSCULAR | Status: AC | PRN
  Administered 2023-06-16 (×2): 50 ug via INTRAVENOUS

## 2023-06-16 MED ORDER — LIDOCAINE HCL (PF) 1 % IJ SOLN: 10.0000 mL | Freq: Once | INTRAMUSCULAR | Status: DC

## 2023-06-16 MED ORDER — SODIUM CHLORIDE 0.9% FLUSH
10.0000 mL | INTRAVENOUS | Status: DC | PRN
  Administered 2023-06-21: 10 mL

## 2023-06-16 MED ORDER — TRAVASOL 10 % IV SOLN: INTRAVENOUS | Status: DC

## 2023-06-16 MED ORDER — MORPHINE SULFATE (PF) 2 MG/ML IV SOLN
1.0000 mg | INTRAVENOUS | Status: DC | PRN
  Administered 2023-06-16 – 2023-06-18 (×17): 2 mg via INTRAVENOUS
  Administered 2023-06-19: 4 mg via INTRAVENOUS
  Administered 2023-06-19: 2 mg via INTRAVENOUS
  Administered 2023-06-19: 4 mg via INTRAVENOUS
  Administered 2023-06-19 (×3): 2 mg via INTRAVENOUS
  Administered 2023-06-19 – 2023-06-20 (×2): 4 mg via INTRAVENOUS
  Administered 2023-06-20 (×2): 2 mg via INTRAVENOUS
  Administered 2023-06-20 – 2023-06-21 (×3): 4 mg via INTRAVENOUS
  Administered 2023-06-21: 2 mg via INTRAVENOUS
  Administered 2023-06-21 – 2023-06-24 (×8): 4 mg via INTRAVENOUS
  Filled 2023-06-16 (×2): qty 2
  Filled 2023-06-16 (×4): qty 1
  Filled 2023-06-16: qty 2
  Filled 2023-06-16 (×4): qty 1
  Filled 2023-06-16: qty 2
  Filled 2023-06-16: qty 1
  Filled 2023-06-16 (×3): qty 2
  Filled 2023-06-16: qty 1
  Filled 2023-06-16: qty 2
  Filled 2023-06-16: qty 1
  Filled 2023-06-16: qty 2
  Filled 2023-06-16 (×2): qty 1
  Filled 2023-06-16 (×4): qty 2
  Filled 2023-06-16 (×2): qty 1
  Filled 2023-06-16 (×2): qty 2
  Filled 2023-06-16 (×3): qty 1
  Filled 2023-06-16: qty 2
  Filled 2023-06-16: qty 1
  Filled 2023-06-16: qty 2
  Filled 2023-06-16 (×4): qty 1

## 2023-06-16 MED ORDER — SODIUM CHLORIDE 0.9% FLUSH
5.0000 mL | Freq: Three times a day (TID) | INTRAVENOUS | Status: DC
  Administered 2023-06-16 – 2023-06-26 (×27): 5 mL

## 2023-06-16 MED ORDER — SODIUM CHLORIDE 0.9% FLUSH
10.0000 mL | Freq: Two times a day (BID) | INTRAVENOUS | Status: DC
  Administered 2023-06-16 – 2023-06-22 (×7): 10 mL
  Administered 2023-06-22: 40 mL
  Administered 2023-06-23 – 2023-06-25 (×6): 10 mL

## 2023-06-16 MED ORDER — PIPERACILLIN-TAZOBACTAM 3.375 G IVPB
3.3750 g | Freq: Three times a day (TID) | INTRAVENOUS | Status: DC
  Administered 2023-06-16 – 2023-06-21 (×15): 3.375 g via INTRAVENOUS
  Filled 2023-06-16 (×18): qty 50

## 2023-06-16 MED ORDER — INSULIN ASPART 100 UNIT/ML IJ SOLN
0.0000 [IU] | INTRAMUSCULAR | Status: AC
  Administered 2023-06-16: 7 [IU] via SUBCUTANEOUS
  Administered 2023-06-16: 4 [IU] via SUBCUTANEOUS
  Administered 2023-06-17: 11 [IU] via SUBCUTANEOUS
  Administered 2023-06-17 – 2023-06-18 (×6): 7 [IU] via SUBCUTANEOUS
  Administered 2023-06-18: 4 [IU] via SUBCUTANEOUS
  Administered 2023-06-18: 7 [IU] via SUBCUTANEOUS
  Administered 2023-06-18: 4 [IU] via SUBCUTANEOUS

## 2023-06-16 MED ORDER — INSULIN ASPART 100 UNIT/ML IJ SOLN: 0.0000 [IU] | INTRAMUSCULAR | Status: DC

## 2023-06-16 MED ORDER — MIDAZOLAM HCL 2 MG/2ML IJ SOLN
INTRAMUSCULAR | Status: AC | PRN
  Administered 2023-06-16 (×2): 1 mg via INTRAVENOUS

## 2023-06-16 MED ORDER — TRAVASOL 10 % IV SOLN
INTRAVENOUS | Status: AC
  Filled 2023-06-16: qty 570.2

## 2023-06-16 NOTE — Progress Notes (Signed)
Gastroenterology Inpatient Follow Up    Subjective: Denies any abdominal pain.  Plan is for CT-guided drain placement today  Objective: Vital signs in last 24 hours: Temp:  [98 F (36.7 C)-99.4 F (37.4 C)] 98 F (36.7 C) (08/02 0827) Pulse Rate:  [98-109] 105 (08/02 1455) Resp:  [15-21] 16 (08/02 1455) BP: (109-131)/(59-80) 131/78 (08/02 1455) SpO2:  [97 %-100 %] 100 % (08/02 1455) Weight:  [101 kg] 101 kg (08/02 0433) Last BM Date : 06/14/23  Intake/Output from previous day: No intake/output data recorded. Intake/Output this shift: No intake/output data recorded.  General appearance: alert and cooperative Resp: no increased WOB Cardio: mildly tachycardic GI: non-tender, non-distended  Lab Results: Recent Labs    06/14/23 0405 06/15/23 0237  WBC 6.9 6.4  HGB 10.8* 8.9*  HCT 34.8* 29.3*  PLT 268 251   BMET Recent Labs    06/14/23 0405 06/15/23 0237  NA 133* 130*  K 4.4 3.8  CL 92* 96*  CO2 27 24  GLUCOSE 237* 192*  BUN 21* 15  CREATININE 0.84 0.62  CALCIUM 9.0 8.3*   LFT Recent Labs    06/15/23 0237  PROT 5.8*  ALBUMIN 2.0*  AST 55*  ALT 237*  ALKPHOS 263*  BILITOT 3.7*   PT/INR Recent Labs    06/15/23 0237 06/16/23 1136  LABPROT 18.9* 17.3*  INR 1.6* 1.4*   Hepatitis Panel No results for input(s): "HEPBSAG", "HCVAB", "HEPAIGM", "HEPBIGM" in the last 72 hours. C-Diff No results for input(s): "CDIFFTOX" in the last 72 hours.  Studies/Results: [place] EKG SITE RITE  Result Date: 06/15/2023 If Site Rite image not attached, placement could not be confirmed due to current cardiac rhythm.  DG UGI W SINGLE CM (SOL OR THIN BA)  Result Date: 06/15/2023 CLINICAL DATA:  52 year-old female with history of gastric sleeve in 2015, presents with RUQ pain. Left upper quadrant gas and fluid collection seen on cross-sectional imaging. Evaluate for leak/perforation. EXAM: DG UGI W SINGLE CM TECHNIQUE: Single contrast examination was then performed using  100 mL Omnipaque 300. This exam was performed by Loyce Dys PA-C, and was supervised and interpreted by Malachi Pro, MD. FLUOROSCOPY: Radiation Exposure Index (as provided by the fluoroscopic device): 35.5 mGy Kerma COMPARISON:  CT Abdomen Pelvis 06/14/23 FINDINGS: Scout Radiograph: Normal bowel gas pattern with oral contrast in the colon. Postsurgical changes of the stomach. Esophagus: Normal appearance. Esophageal motility: Tertiary contractions. Gastroesophageal reflux: None visualized. Ingested 13mm barium tablet: Not given. Stomach: Postsurgical changes from prior sleeve gastrectomy. Contrast is seen extravasating from the gastric fundus into the left upper quadrant collection. Gastric emptying: Normal. Duodenum: Normal appearance. Other:  None. IMPRESSION: 1. Findings consistent with leak/contained perforation of the gastric fundus, presumably due to suture dehiscence from prior sleeve gastrectomy. 2. Mild esophageal dysmotility. These results were called by telephone at the time of interpretation on 06/15/2023 at 11:03 am to provider [facility], who verbally acknowledged these results. Electronically Signed   By: Obie Dredge M.D.   On: 06/15/2023 11:45   CT ABDOMEN PELVIS W CONTRAST  Result Date: 06/14/2023 CLINICAL DATA:  Abnormal MRI EXAM: CT ABDOMEN AND PELVIS WITH CONTRAST TECHNIQUE: Multidetector CT imaging of the abdomen and pelvis was performed using the standard protocol following bolus administration of intravenous contrast. RADIATION DOSE REDUCTION: This exam was performed according to the departmental dose-optimization program which includes automated exposure control, adjustment of the mA and/or kV according to patient size and/or use of iterative reconstruction technique. CONTRAST:  75mL OMNIPAQUE IOHEXOL 350 MG/ML SOLN COMPARISON:  MRI 06/13/2023, CT 06/08/2023 FINDINGS: Lower chest: Lung bases demonstrate no acute airspace disease. Linear atelectasis at the right base.  Hepatobiliary: No focal liver abnormality is seen. No gallstones, gallbladder wall thickening, or biliary dilatation. Pancreas: Unremarkable. No pancreatic ductal dilatation or surrounding inflammatory changes. Spleen: Normal in size without focal abnormality. Adrenals/Urinary Tract: Adrenal glands are unremarkable. Kidneys are normal, without renal calculi, focal lesion, or hydronephrosis. Bladder is unremarkable. Stomach/Bowel: Status post gastric bypass. No evidence for bowel obstruction or bowel wall thickening. Vascular/Lymphatic: No significant vascular findings are present. No enlarged abdominal or pelvic lymph nodes. Reproductive: Enlarged uterus with numerous enhancing masses presumably fibroids. Other: Negative for pelvic effusion or free air. Large gas and fluid collection in the left upper quadrant measuring 10.1 x 7.9 cm lateral to the stomach. Musculoskeletal: No acute or significant osseous findings. IMPRESSION: 1. Large left upper quadrant gas and fluid collection measuring 10.1 cm lateral to the stomach, concerning for contained perforation/leak and or abscess. Collection is in close proximity to suture line from patient's previous gastric bypass surgery. 2. Enlarged uterus with multiple fibroids. Electronically Signed   By: Jasmine Pang M.D.   On: 06/14/2023 18:40    Medications: I have reviewed the patient's current medications. Scheduled:  budesonide (PULMICORT) nebulizer solution  0.25 mg Nebulization BID   Chlorhexidine Gluconate Cloth  6 each Topical Daily   enoxaparin (LOVENOX) injection  40 mg Subcutaneous Q24H   insulin aspart  0-15 Units Subcutaneous Q4H   insulin aspart  0-9 Units Subcutaneous Q4H   pantoprazole (PROTONIX) IV  40 mg Intravenous Q12H   sodium chloride flush  10-40 mL Intracatheter Q12H   Continuous:  TPN ADULT (ION)      ZOX:WRUEAVWUJ, fentaNYL, midazolam, morphine injection, [DISCONTINUED] ondansetron **OR** ondansetron (ZOFRAN) IV, mouth rinse, sodium  chloride flush  Assessment/Plan: 52 year old female with history of diabetes, asthma, obesity s/p gastric sleeve in 2015, and GERD presented with AMS, found to have metabolic acidosis and respiratory alkalosis with uncontrolled blood sugars. We were consulted for elevated LFTs. Causes of her elevated LFTs could include ischemic hepatitis (patient had some hypotension on 7/27 that could have led to her subsequent elevation LFTs on 7/28), DILI (patient received CTX for urosepsis recently), CBD stricture, and/or infection (perhaps related to recent findings of a contained perforation).  MRI/MRCP showed a 9 cm collection of gas and fluid lateral to the stomach in the left upper quadrant of the abdomen as well as mild intrahepatic bile duct dilatation with a focal area of narrowing involving the proximal CBD that could be concerning for stricture.  CT A/P showed a large fluid collection next to the stomach from her previous gastric sleeve surgery.  Upper GI series confirmed a leak/contained perforation of the gastric fundus presumably due to suture dehiscence from prior sleeve gastrectomy.  Patient is going to get a CT-guided drain placed into the perigastric fluid collection.  I did discuss the idea of EGD on Monday with the patient, and she wanted to hold off on this discussion until I was able to discuss this further with the surgical team.  Surgery team will plan to see her later to talk about their thoughts  - Continue to trend LFTs - Plan is for CT-guided drain placement today - Tentative plan for EGD with possible gastric stent placement on Monday with Dr. Meridee Score - Follow surgery recommendations - If patient's LFTs were to increase again in the future, then would consider PBD placement.  LOS: 8 days   Frances Rodgers 06/16/2023, 3:05 PM

## 2023-06-16 NOTE — TOC Progression Note (Addendum)
Transition of Care [facility]) - Progression Note    Patient Details  Name: Frances Rodgers MRN: [id number] Date of Birth: 1971/03/14  Transition of Care [facility]) CM/SW Contact  Tom-Johnson, Hershal Coria, RN Phone Number: 06/16/2023, 3:42 PM  Clinical Narrative:      MRI/MRCP on 06/13/23 showed a 9 cm collection of gas and fluid to Lateral of Left upper quadrant Stomach concerning for stricture. Upper GI series confirmed a leak/contained perforation of the Gastric Fundus presumably due to suture dehiscence from prior Sleeve Gastrectomy.  Patient underwent JP  drain placement today 06/16/23 by IR.  Plan for EGD with possible Gastric Stent placement on Monday 06/19/23. Dietician consult for TPN Management.  GI, General Sx following. Medical workup continues.   CM will continue to follow as patient progresses with care towards discharge.       Expected Discharge Plan and Services                                               Social Determinants of Health (SDOH) Interventions SDOH Screenings   Food Insecurity: No Food Insecurity (06/16/2023)  Housing: Low Risk  (06/16/2023)  Transportation Needs: No Transportation Needs (06/16/2023)  Utilities: Not At Risk (06/16/2023)  Alcohol Screen: Low Risk  (05/03/2018)  Depression (PHQ2-9): Low Risk  (04/12/2023)  Tobacco Use: Low Risk  (06/15/2023)    Readmission Risk Interventions     No data to display

## 2023-06-16 NOTE — Progress Notes (Signed)
Frances Rodgers  WGN:[id number] DOB: 12-Feb-1971 DOA: 06/07/2023 PCP: Tollie Eth, NP    Brief Narrative:  52 year old with a history of mild intermittent asthma and recently diagnosed DM who was brought to the ER by a family member with altered mental status.  Workup revealed elevated LFTs.  Goals of Care:   Code Status: Full Code   DVT prophylaxis: enoxaparin (LOVENOX) injection 40 mg Start: 06/07/23 2000  Interim Hx: No acute events recorded overnight.  PICC line placed at bedside by IV team today.  Afebrile.  Vital signs stable.  Assessment & Plan:  9.2 cm collection of gas and fluid left upper quadrant Incidentally noted on MRI abdomen -follow-up CT confirms this finding with appearance worrisome for possible contained perforation given proximity to previous suture line from gastric bypass procedure -General surgery now following and coordinating care with the GI team -initial plan is for n.p.o. status with initiation of TNA and IR drainage of the fluid collection  Acute metabolic encephalopathy of unclear etiology Possibly related to profound acid-base abnormalities/acute metabolic acidosis/respiratory alkalosis - UDS negative -UA without evidence of UTI -CXR with no clear infiltrate -thyroid function recently confirmed to be within normal limits -has resolved at this time  Acute transaminitis Unclear etiology -US revealed no obstructive gallstones and no suggestion of acute cholecystitis -CT abdomen 7/25 without acute findings -viral hepatitis panel negative -possible shock liver versus drug-induced liver injury -AMA and ASMA negative -CMV IgM negative -MRCP 7/21 revealed a 9.2 x 7.0 x 7.2 cm collection of gas and fluid in the left upper quadrant lateral to the stomach of indeterminate etiology as well as mild intrahepatic bile duct dilatation with a focal area of narrowing involving the proximal common bile duct possibly representing a stricture -LFTs continue to improve and are now  approaching normal  Recent Labs  Lab 06/11/23 0036 06/12/23 0323 06/13/23 0020 06/14/23 0405 06/15/23 0237  AST 1,434* 943* 244* 93* 55*  ALT 813* 895* 635* 422* 237*  ALKPHOS 313* 258* 289* 303* 263*  BILITOT 3.5* 4.1* 4.6* 4.9* 3.7*  PROT 5.9* 5.8* 5.9* 6.6 5.8*  ALBUMIN 1.7* 2.5* 2.3* 2.4* 2.0*   Acute kidney injury Felt to be due to poor intake/dehydration -now resolved  DM2 with starvation ketoacidosis CBG well-controlled with patient presently n.p.o.  Peripheral edema Venous duplex negative for DVT  Chronic mild intermittent asthma Stable at present  Obesity - Body mass index is 37.05 kg/m.  Status post lap band and subsequent removal   Family Communication: No family present at time of exam Disposition: Discharge home will be delayed   Objective: Blood pressure 113/74, pulse (!) 104, temperature 98 F (36.7 C), temperature source Oral, resp. rate 18, height 5\' 5"  (1.651 m), weight 101 kg, last menstrual period 06/08/2023, SpO2 98%.  Intake/Output Summary (Last 24 hours) at 06/16/2023 1041 Last data filed at 06/16/2023 0705 Gross per 24 hour  Intake 0 ml  Output 0 ml  Net 0 ml   Filed Weights   06/16/23 0433  Weight: 101 kg    Examination: Unavailable for exam at time of my visit to the unit/room  CBC: Recent Labs  Lab 06/12/23 0323 06/14/23 0405 06/15/23 0237  WBC 11.5* 6.9 6.4  HGB 9.8* 10.8* 8.9*  HCT 32.1* 34.8* 29.3*  MCV 76.6* 78.9* 78.6*  PLT 262 268 251   Basic Metabolic Panel: Recent Labs  Lab 06/13/23 0020 06/14/23 0405 06/15/23 0237  NA 132* 133* 130*  K 4.4 4.4 3.8  CL  96* 92* 96*  CO2 26 27 24   GLUCOSE 252* 237* 192*  BUN 19 21* 15  CREATININE 0.92 0.84 0.62  CALCIUM 8.4* 9.0 8.3*   GFR: Estimated Creatinine Clearance: 99.1 mL/min (by C-G formula based on SCr of 0.62 mg/dL).   Scheduled Meds:  budesonide (PULMICORT) nebulizer solution  0.25 mg Nebulization BID   Chlorhexidine Gluconate Cloth  6 each Topical Daily    enoxaparin (LOVENOX) injection  40 mg Subcutaneous Q24H   insulin aspart  0-9 Units Subcutaneous Q4H   pantoprazole (PROTONIX) IV  40 mg Intravenous Q12H   sodium chloride flush  10-40 mL Intracatheter Q12H      LOS: 8 days   Lonia Blood, MD [facility] Office  [phone] Pager - Text Page per [name]  If 7PM-7AM, please contact night-coverage per Amion 06/16/2023, 10:41 AM

## 2023-06-16 NOTE — Progress Notes (Signed)
PHARMACY - TOTAL PARENTERAL NUTRITION CONSULT NOTE   Indication:  gastric leak  Patient Measurements: Height: 5\' 5"  (165.1 cm) Weight: 101 kg (222 lb 10.6 oz) IBW/kg (Calculated) : 57   Body mass index is 37.05 kg/m. Adj Body Weight: 75 kg Usual Weight: ~97.3 kg  Assessment:  52 yo F with PMH of T2DM, asthma, obesity s/p gastric sleeve in 2015, and GERD. Pt presented on 7/24 with AMS and was found to have metabolic acidosis and respiratory alkalosis with uncontrolled BG. LFTs were elevated on admission and GI was consulted. LFTs initially improved, then peaked on 06/11/23. On 8/1, UGI showed a gastric leak likely due to suture dehiscence of previous gastric sleeve performed in 2015. Surgery was consulted on 8/1 and patient was made NPO. A 10mL fluid collection was also noted adjacent to the stable line and the gastric perforation. IR has been consulted for drain placement. Pharmacy was consulted for TPN initiation on 8/2 due to gastric leak to provide nutrition.  Patient was not available for interview due to IR procedure. History was obtained from chart review. Based on historical weights in the chart, estimate patient's usual body weight to be ~97.3kg over the past year. Pt's daughter reported previously that pt had not been eating or drinking much 2 days PTA.   Glucose / Insulin: T2DM, A1c 11.7% (05/27/23), cBG 77-210 - glargine 40 units daily + 16u SSI Electrolytes: 8/1 Na 130, K 3.8, Cl 96, coCa~9.9, all others WNL Renal: SCr <1, BUN WNL Hepatic: AST/ALT trending down to 55/237, Alk Phos trending down to 263, T bili trending down to 3.7, no jaundice noted, albumin 2.0 Intake / Output; MIVF: UOP x4 occurrences documented, LBM 8/1, no MIVF GI Imaging: 8/1 UGI: gastric leak likely due to suture dehiscence from prior sleeve gastrectomy GI Surgeries / Procedures: None   Central access: PICC placed 06/16/23 TPN start date: 06/16/23  Nutritional Goals: Goal TPN rate is 108 mL/hr (provides  114 g of protein and 2232 kcals per day)  RD Assessment: pending    Current Nutrition:  NPO  Plan:  Initiate TPN at 18mL/hr at 1800  TPN to provide 57g of protein and 1115 kcal meeting ~50% estimated daily caloric needs Electrolytes in TPN: Na 58mEq/L, K 20mEq/L, Ca 101mEq/L, Mg 13mEq/L, and Phos 23mmol/L. Cl:Ac 2:1 Add standard MVI and trace elements to TPN Change to Moderate q4h SSI and adjust as needed - note patient received 40 units of glargine on 8/1 prior to transition to NPO status Monitor TPN labs daily until stable, then every Mon/Thurs Will follow-up with patient history on 8/3   Wilburn Cornelia, PharmD, BCPS Clinical Pharmacist 06/16/2023 1:21 PM   Please refer to [facility] for pharmacy phone number

## 2023-06-16 NOTE — Progress Notes (Signed)
Peripherally Inserted Central Catheter Placement  The IV Nurse has discussed with the patient and/or persons authorized to consent for the patient, the purpose of this procedure and the potential benefits and risks involved with this procedure.  The benefits include less needle sticks, lab draws from the catheter, and the patient may be discharged home with the catheter. Risks include, but not limited to, infection, bleeding, blood clot (thrombus formation), and puncture of an artery; nerve damage and irregular heartbeat and possibility to perform a PICC exchange if needed/ordered by physician.  Alternatives to this procedure were also discussed.  Bard Power PICC patient education guide, fact sheet on infection prevention and patient information card has been provided to patient /or left at bedside.    PICC Placement Documentation  PICC Double Lumen 06/16/23 Right Brachial 36 cm 0 cm (Active)  Indication for Insertion or Continuance of Line Administration of hyperosmolar/irritating solutions (i.e. TPN, Vancomycin, etc.) 06/16/23 1000  Exposed Catheter (cm) 0 cm 06/16/23 1000  Site Assessment Clean, Dry, Intact 06/16/23 1000  Lumen #1 Status Flushed;Saline locked;Blood return noted 06/16/23 1000  Lumen #2 Status Flushed;Saline locked;Blood return noted 06/16/23 1000  Dressing Type Transparent;Securing device 06/16/23 1000  Dressing Status Antimicrobial disc in place;Clean, Dry, Intact 06/16/23 1000  Safety Lock Not Applicable 06/16/23 1000  Line Care Connections checked and tightened 06/16/23 1000  Line Adjustment (NICU/IV Team Only) No 06/16/23 1000  Dressing Intervention New dressing 06/16/23 1000  Dressing Change Due 06/23/23 06/16/23 1000       [name] 06/16/2023, 10:41 AM

## 2023-06-16 NOTE — Progress Notes (Addendum)
[facility] Progress Note     Subjective: CC-  Just back from IR where a drain was placed into the LUQ collection with several hundred mL purulent material aspirated.  She is having a lot of pain right now.     Objective: Vital signs in last 24 hours: Temp:  [98 F (36.7 C)-99.4 F (37.4 C)] 98 F (36.7 C) (08/02 0827) Pulse Rate:  [98-109] 105 (08/02 1455) Resp:  [15-21] 16 (08/02 1455) BP: (109-131)/(59-80) 131/78 (08/02 1455) SpO2:  [98 %-100 %] 100 % (08/02 1455) Weight:  [101 kg] 101 kg (08/02 0433) Last BM Date : 06/14/23  Intake/Output from previous day: No intake/output data recorded. Intake/Output this shift: No intake/output data recorded.  PE: Gen:  Alert Abd: Soft, tender after drain placement, drain with serosang output and full Ext:  PICC RUE. Psych: A&Ox4   Lab Results:  Recent Labs    06/14/23 0405 06/15/23 0237  WBC 6.9 6.4  HGB 10.8* 8.9*  HCT 34.8* 29.3*  PLT 268 251   BMET Recent Labs    06/14/23 0405 06/15/23 0237  NA 133* 130*  K 4.4 3.8  CL 92* 96*  CO2 27 24  GLUCOSE 237* 192*  BUN 21* 15  CREATININE 0.84 0.62  CALCIUM 9.0 8.3*   PT/INR Recent Labs    06/15/23 0237 06/16/23 1136  LABPROT 18.9* 17.3*  INR 1.6* 1.4*   CMP     Component Value Date/Time   NA 130 (L) 06/15/2023 0237   NA 136 09/30/2022 1457   NA 137 12/24/2013 1044   K 3.8 06/15/2023 0237   K 3.8 12/24/2013 1044   CL 96 (L) 06/15/2023 0237   CL 103 12/24/2013 1044   CO2 24 06/15/2023 0237   CO2 26 12/24/2013 1044   GLUCOSE 192 (H) 06/15/2023 0237   GLUCOSE 116 (H) 12/24/2013 1044   BUN 15 06/15/2023 0237   BUN 12 09/30/2022 1457   BUN 8 12/24/2013 1044   CREATININE 0.62 06/15/2023 0237   CREATININE 1.06 12/24/2013 1044   CALCIUM 8.3 (L) 06/15/2023 0237   CALCIUM 9.0 12/24/2013 1044   PROT 5.8 (L) 06/15/2023 0237   PROT 7.0 09/30/2022 1457   PROT 7.4 12/24/2013 1044   ALBUMIN 2.0 (L) 06/15/2023 0237   ALBUMIN 3.9 09/30/2022 1457    ALBUMIN 3.4 12/24/2013 1044   AST 55 (H) 06/15/2023 0237   AST 17 12/24/2013 1044   ALT 237 (H) 06/15/2023 0237   ALT 25 12/24/2013 1044   ALKPHOS 263 (H) 06/15/2023 0237   ALKPHOS 76 12/24/2013 1044   BILITOT 3.7 (H) 06/15/2023 0237   BILITOT 0.3 09/30/2022 1457   BILITOT 0.5 12/24/2013 1044   GFRNONAA >60 06/15/2023 0237   GFRNONAA >60 12/24/2013 1044   GFRAA 92 07/08/2020 1614   GFRAA >60 12/24/2013 1044   Lipase     Component Value Date/Time   LIPASE 64 (H) 06/11/2023 0036   LIPASE 92 12/24/2013 1044       Studies/Results: CT GUIDED PERITONEAL/RETROPERITONEAL FLUID DRAIN BY PERC CATH  Result Date: 06/16/2023 INDICATION: Contained perforation/leak of gastric sleeve with left upper quadrant abscess. EXAM: CT-guided drain placement MEDICATIONS: The patient is currently admitted to the hospital and receiving intravenous antibiotics. The antibiotics were administered within an appropriate time frame prior to the initiation of the procedure. ANESTHESIA/SEDATION: Moderate (conscious) sedation was employed during this procedure. A total of Versed 2 mg and Fentanyl 100 mcg was administered intravenously by the radiology nurse. Total intra-service moderate  Sedation Time: 17 minutes. The patient's level of consciousness and vital signs were monitored continuously by radiology nursing throughout the procedure under my direct supervision. COMPLICATIONS: None immediate. PROCEDURE: Informed written consent was obtained from the patient after a thorough discussion of the procedural risks, benefits and alternatives. All questions were addressed. Maximal Sterile Barrier Technique was utilized including caps, mask, sterile gowns, sterile gloves, sterile drape, hand hygiene and skin antiseptic. A timeout was performed prior to the initiation of the procedure. CT imaging was performed. The fluid and gas collection in the left upper quadrant was identified. A suitable skin entry site was selected and  marked. Local anesthesia was attained by infiltration with 1% lidocaine. A small dermatotomy was made. Under intermittent CT guidance, an 18 gauge trocar needle was carefully advanced into the collection. A 0.035 wire was coiled in the collection. The percutaneous tract was dilated to 12 [place]. A 12 French all-purpose drainage catheter was advanced over the wire and formed. Aspiration yields approximately 250 mL of purulent fluid. A sample was sent for Gram stain and culture. The drain was flushed and connected to JP bulb suction before being secured to the skin with 0 Prolene suture. Follow-up CT imaging demonstrates a well-positioned drainage catheter and no evidence of complication. IMPRESSION: Successful placement of 12 French drainage catheter into the left upper quadrant abscess. Electronically Signed   By: Malachy Moan M.D.   On: 06/16/2023 15:32   [place] EKG SITE RITE  Result Date: 06/15/2023 If Site Rite image not attached, placement could not be confirmed due to current cardiac rhythm.  DG UGI W SINGLE CM (SOL OR THIN BA)  Result Date: 06/15/2023 CLINICAL DATA:  52 year-old female with history of gastric sleeve in 2015, presents with RUQ pain. Left upper quadrant gas and fluid collection seen on cross-sectional imaging. Evaluate for leak/perforation. EXAM: DG UGI W SINGLE CM TECHNIQUE: Single contrast examination was then performed using 100 mL Omnipaque 300. This exam was performed by Loyce Dys PA-C, and was supervised and interpreted by Malachi Pro, MD. FLUOROSCOPY: Radiation Exposure Index (as provided by the fluoroscopic device): 35.5 mGy Kerma COMPARISON:  CT Abdomen Pelvis 06/14/23 FINDINGS: [facility] Radiograph: Normal bowel gas pattern with oral contrast in the colon. Postsurgical changes of the stomach. Esophagus: Normal appearance. Esophageal motility: Tertiary contractions. Gastroesophageal reflux: None visualized. Ingested 13mm barium tablet: Not given. Stomach: Postsurgical changes  from prior sleeve gastrectomy. Contrast is seen extravasating from the gastric fundus into the left upper quadrant collection. Gastric emptying: Normal. Duodenum: Normal appearance. Other:  None. IMPRESSION: 1. Findings consistent with leak/contained perforation of the gastric fundus, presumably due to suture dehiscence from prior sleeve gastrectomy. 2. Mild esophageal dysmotility. These results were called by telephone at the time of interpretation on 06/15/2023 at 11:03 am to provider [facility] PA, who verbally acknowledged these results. Electronically Signed   By: Obie Dredge M.D.   On: 06/15/2023 11:45    Anti-infectives: Anti-infectives (From admission, onward)    None        Assessment/Plan Hx of prior gastric sleeve at [facility] in 2015 10 cm fluid collection adjacent to staple line secondary to a contained gastric perforation - s/p IR drain into LUQ collection 8/2 >> several hundred mL purulent material aspirated, culture pending - GI following with tentative plans for EGD on Monday to assess the stomach and help determine etiology of her leak, suspect secondary to narrowing near her angularis.   - will determine further plans after this is completed. -  continue strict NPO, TPN - Surgical intervention is not indicated right now as the ultimate treatment will be decompressing the narrowing with EGD, +/- stenting (if this is etiology) and then after the leak heals possible conversion to a roux-en-y bypass. - this was all discussed with the patient, except the possible need for conversion surgery later on.  We can discuss this at a later date as we gather more information. - the patient is agreeable to EGD on Monday.  She expresses understanding that this is likely to be a very long process and the need for all of the things we are doing. - pain medication has been adjusted after JP placement -I have discussed this patient with the GI team as well as the primary team   FEN:  NPO/TNA VTE: LMWH ID: [email or web address]   - per TRH -  Asthma Recent dx of IDDM, DKA, resolved AKI, resolved Transaminitis, improving  I reviewed Consultant GI notes, hospitalist notes, last 24 h vitals and pain scores, last 48 h intake and output, last 24 h labs and trends, and last 24 h imaging results.    LOS: 8 days    Letha Cape, [facility] 06/16/2023, 4:13 PM Please see Amion for pager number during day hours 7:00am-4:30pm

## 2023-06-16 NOTE — Progress Notes (Signed)
PT Cancellation Note  Patient Details Name: Frances Rodgers MRN: [id number] DOB: 1971-04-18   Cancelled Treatment:    Reason Eval/Treat Not Completed: (P) Patient declined, no reason specified (Pt c/o fatigue and frustration. She reports she is not interested in participating in PT and is agreeable to acute PT signing off for now as she has refused x3 this week and reports she is mobilizing PRN in her room.) She would benefit from mobilizing OOB more frequently with nursing staff supervision on unit. Pt agreeable to alert RN/MD if she changes her mind and is willing to have PT re-consulted.   [name] [name] 06/16/2023, 12:21 PM

## 2023-06-16 NOTE — Procedures (Signed)
Interventional Radiology Procedure Note  Procedure: Placement of 80F drain into RUQ collection.  Several hundred mL purulent material aspirated.   Complications: None  Estimated Blood Loss: None  Recommendations: - Drain to JP x 24-48 hrs then change to gravity bag - Flush every 8 hrs  Signed,  Sterling Big, MD

## 2023-06-16 NOTE — Progress Notes (Signed)
Initial Nutrition Assessment  DOCUMENTATION CODES:   Obesity unspecified  INTERVENTION:   - TPN management per Pharmacy  - If plan remains for pt to undergo EGD on Monday, consider post-pyloric small-bore feeding tube placement by MD during EGD and initiation of enteral nutrition  NUTRITION DIAGNOSIS:   Inadequate oral intake related to altered GI function (perforation of the gastric fundus) as evidenced by NPO status.  GOAL:   Patient will meet greater than or equal to 90% of their needs  MONITOR:   Diet advancement, Labs, Weight trends, I & O's, Other (TPN)  REASON FOR ASSESSMENT:   Consult New TPN/TNA  ASSESSMENT:   52 year old female who presented to the ED on 7/24 with AMS and weakness. PMH of newly diagnosed T2DM, asthma, GERD, obesity s/p sleeve gastrectomy in 2015. Pt admitted with acute metabolic encephalopathy, acute transaminitis, AKI.  07/24 - Carb Modified diet 08/01 - CT abdomen/pelvis showing a large fluid collection adjacent to the stomach, NPO 08/02 - UGI showing contrast extraversion concerning for perforation of the gastric fundus presumably due to suture dehiscence from prior sleeve gastrectomy  Consult received for new TPN.  Attempted to meet with pt at bedside. Pt in CT for CT-guided drain placement at time of RD visit. Per GI note, tentative plan for EGD with possible gastric stent placement on Monday with Dr. Meridee Score.  Unable to obtain diet and weight history at this time. Per H&P on 7/24, pt's daughter reported that pt had not been eating or drinking much for the last 2 days PTA. Per notes that mention PO intake during admission, pt has been eating a solid diet with no vomiting, only intermittent nausea. Meal completions while on Carb Modified diet averaged 56% over the last 8 documented meals. Pt is now NPO.  Reviewed weight history in chart. Pt's weight has slowly been trending up over the last 9 months, from 96.9 kg on 10/13/22 to 101 kg  today. Per review of RD note on 05/29/23 from previous admission, pt's daughter reported pt had been losing weight intentionally. This is not reflected in weight history in chart.  Per nursing edema assessment, pt with non-pitting edema to BLE. Edema may be masking weight loss.  TPN ordered to start today at 1800 at rate of 54 ml/hr which will provide 1115 kcal and 57 grams of protein, meeting ~50% of estimated nutrition needs.  Meal Completion: 0-100% on Carb Modified diet  Medications reviewed and include: SSI every 4 hours, IV protonix, TPN to start at 1800  Labs from 8/01 reviewed: sodium 130, chloride 96, hemoglobin 8.9 CBG's: 77-128 x 24 hours  NUTRITION - FOCUSED PHYSICAL EXAM:  Unable to complete. Pt in CT at time of RD visit.  Diet Order:   Diet Order             Diet NPO time specified  Diet effective now                   EDUCATION NEEDS:   No education needs have been identified at this time  Skin:  Skin Assessment: Reviewed RN Assessment  Last BM:  06/14/23  Height:   Ht Readings from Last 1 Encounters:  06/16/23 5\' 5"  (1.651 m)    Weight:   Wt Readings from Last 1 Encounters:  06/16/23 101 kg    Ideal Body Weight:  56.8 kg  BMI:  Body mass index is 37.05 kg/m.  Estimated Nutritional Needs:   Kcal:  2100-2300  Protein:  110-130 grams  Fluid:  >2.0 L    Mertie Clause, MS, RD, LDN Registered Dietitian II Please see AMiON for contact information.

## 2023-06-17 DIAGNOSIS — E873 Alkalosis: Secondary | ICD-10-CM | POA: Diagnosis not present

## 2023-06-17 DIAGNOSIS — K255 Chronic or unspecified gastric ulcer with perforation: Secondary | ICD-10-CM | POA: Diagnosis not present

## 2023-06-17 DIAGNOSIS — E872 Acidosis, unspecified: Secondary | ICD-10-CM | POA: Diagnosis not present

## 2023-06-17 DIAGNOSIS — R4182 Altered mental status, unspecified: Secondary | ICD-10-CM | POA: Diagnosis not present

## 2023-06-17 DIAGNOSIS — R7401 Elevation of levels of liver transaminase levels: Secondary | ICD-10-CM | POA: Diagnosis not present

## 2023-06-17 LAB — GLUCOSE, CAPILLARY
Glucose-Capillary: 266 mg/dL — ABNORMAL HIGH (ref 70–99)
Glucose-Capillary: 247 mg/dL — ABNORMAL HIGH (ref 70–99)
Glucose-Capillary: 239 mg/dL — ABNORMAL HIGH (ref 70–99)
Glucose-Capillary: 215 mg/dL — ABNORMAL HIGH (ref 70–99)
Glucose-Capillary: 201 mg/dL — ABNORMAL HIGH (ref 70–99)

## 2023-06-17 MED ORDER — INSULIN ASPART 100 UNIT/ML IJ SOLN
5.0000 [IU] | INTRAMUSCULAR | Status: AC
  Administered 2023-06-17 (×2): 5 [IU] via SUBCUTANEOUS

## 2023-06-17 MED ORDER — POTASSIUM CHLORIDE 10 MEQ/100ML IV SOLN
10.0000 meq | INTRAVENOUS | Status: AC
  Administered 2023-06-17 (×3): 10 meq via INTRAVENOUS
  Filled 2023-06-17 (×3): qty 100

## 2023-06-17 MED ORDER — INSULIN ASPART 100 UNIT/ML IJ SOLN
5.0000 [IU] | Freq: Once | INTRAMUSCULAR | Status: AC
  Administered 2023-06-17: 5 [IU] via SUBCUTANEOUS

## 2023-06-17 MED ORDER — TRAVASOL 10 % IV SOLN
INTRAVENOUS | Status: AC
  Filled 2023-06-17: qty 777.6

## 2023-06-17 MED ORDER — MAGNESIUM SULFATE 2 GM/50ML IV SOLN
2.0000 g | Freq: Once | INTRAVENOUS | Status: AC
  Administered 2023-06-17: 2 g via INTRAVENOUS
  Filled 2023-06-17: qty 50

## 2023-06-17 NOTE — Progress Notes (Signed)
Gastroenterology Inpatient Follow Up    Subjective: Patient had CT-guided drain placement performed yesterday that was successful.  She has had good output through this drain so far with 280 mL reported yesterday.  Objective: Vital signs in last 24 hours: Temp:  [97.9 F (36.6 C)-98.4 F (36.9 C)] 97.9 F (36.6 C) (08/03 0801) Pulse Rate:  [95-109] 95 (08/03 0801) Resp:  [15-21] 18 (08/03 0801) BP: (103-131)/(61-80) 103/61 (08/03 0801) SpO2:  [96 %-100 %] 96 % (08/03 0801) Weight:  [101.3 kg] 101.3 kg (08/03 0500) Last BM Date : 06/16/23  Intake/Output from previous day: 08/02 0701 - 08/03 0700 In: 768.2 [I.V.:664.7; IV Piggyback:103.4] Out: 280 [Drains:280] Intake/Output this shift: Total I/O In: 378.2 [I.V.:216; Other:5; IV Piggyback:157.2] Out: 55 [Drains:55]  General appearance: alert and cooperative Resp: no increased WOB Cardio: mildly tachycardic GI: non-tender, non-distended , left upper quadrant drain in place with serosanguineous output  Lab Results: Recent Labs    06/15/23 0237 06/17/23 0454  WBC 6.4 4.6  HGB 8.9* 8.4*  HCT 29.3* 28.2*  PLT 251 190   BMET Recent Labs    06/15/23 0237 06/17/23 0454  NA 130* 133*  K 3.8 3.5  CL 96* 102  CO2 24 26  GLUCOSE 192* 296*  BUN 15 10  CREATININE 0.62 0.78  CALCIUM 8.3* 8.2*   LFT Recent Labs    06/17/23 0454  PROT 5.8*  ALBUMIN 1.8*  AST 31  ALT 113*  ALKPHOS 194*  BILITOT 2.8*   PT/INR Recent Labs    06/15/23 0237 06/16/23 1136  LABPROT 18.9* 17.3*  INR 1.6* 1.4*   Hepatitis Panel No results for input(s): "HEPBSAG", "HCVAB", "HEPAIGM", "HEPBIGM" in the last 72 hours. C-Diff No results for input(s): "CDIFFTOX" in the last 72 hours.  Studies/Results: CT GUIDED PERITONEAL/RETROPERITONEAL FLUID DRAIN BY PERC CATH  Result Date: 06/16/2023 INDICATION: Contained perforation/leak of gastric sleeve with left upper quadrant abscess. EXAM: CT-guided drain placement MEDICATIONS: The  patient is currently admitted to the hospital and receiving intravenous antibiotics. The antibiotics were administered within an appropriate time frame prior to the initiation of the procedure. ANESTHESIA/SEDATION: Moderate (conscious) sedation was employed during this procedure. A total of Versed 2 mg and Fentanyl 100 mcg was administered intravenously by the radiology nurse. Total intra-service moderate Sedation Time: 17 minutes. The patient's level of consciousness and vital signs were monitored continuously by radiology nursing throughout the procedure under my direct supervision. COMPLICATIONS: None immediate. PROCEDURE: Informed written consent was obtained from the patient after a thorough discussion of the procedural risks, benefits and alternatives. All questions were addressed. Maximal Sterile Barrier Technique was utilized including caps, mask, sterile gowns, sterile gloves, sterile drape, hand hygiene and skin antiseptic. A timeout was performed prior to the initiation of the procedure. CT imaging was performed. The fluid and gas collection in the left upper quadrant was identified. A suitable skin entry site was selected and marked. Local anesthesia was attained by infiltration with 1% lidocaine. A small dermatotomy was made. Under intermittent CT guidance, an 18 gauge trocar needle was carefully advanced into the collection. A 0.035 wire was coiled in the collection. The percutaneous tract was dilated to 12 [place]. A 12 French all-purpose drainage catheter was advanced over the wire and formed. Aspiration yields approximately 250 mL of purulent fluid. A sample was sent for Gram stain and culture. The drain was flushed and connected to JP bulb suction before being secured to the skin with 0 Prolene suture. Follow-up CT imaging demonstrates  a well-positioned drainage catheter and no evidence of complication. IMPRESSION: Successful placement of 12 French drainage catheter into the left upper quadrant  abscess. Electronically Signed   By: Malachy Moan M.D.   On: 06/16/2023 15:32   [place] EKG SITE RITE  Result Date: 06/15/2023 If Site Rite image not attached, placement could not be confirmed due to current cardiac rhythm.   Medications: I have reviewed the patient's current medications. Scheduled:  Chlorhexidine Gluconate Cloth  6 each Topical Daily   enoxaparin (LOVENOX) injection  40 mg Subcutaneous Q24H   insulin aspart  0-20 Units Subcutaneous Q4H   insulin aspart  5 Units Subcutaneous Q4H   lidocaine (PF)  10 mL Other Once   pantoprazole (PROTONIX) IV  40 mg Intravenous Q12H   sodium chloride flush  10-40 mL Intracatheter Q12H   sodium chloride flush  5 mL Intracatheter Q8H   Continuous:  piperacillin-tazobactam (ZOSYN)  IV 3.375 g (06/17/23 0543)   potassium chloride 10 mEq (06/17/23 1153)   TPN ADULT (ION) 54 mL/hr at 06/16/23 1737   TPN ADULT (ION)      NWG:NFAOZHYQM, morphine injection, [DISCONTINUED] ondansetron **OR** ondansetron (ZOFRAN) IV, mouth rinse, sodium chloride flush  Assessment/Plan: 52 year old female with history of diabetes, asthma, obesity s/p gastric sleeve in 2015, and GERD presented with AMS, found to have metabolic acidosis and respiratory alkalosis with uncontrolled blood sugars. We were consulted for elevated LFTs. Causes of her elevated LFTs could include ischemic hepatitis (patient had some hypotension on 7/27 that could have led to her subsequent elevation LFTs on 7/28), DILI (patient received CTX for urosepsis recently), CBD stricture, and/or infection (perhaps related to recent findings of a contained perforation).  MRI/MRCP showed a 9 cm collection of gas and fluid lateral to the stomach in the left upper quadrant of the abdomen as well as mild intrahepatic bile duct dilatation with a focal area of narrowing involving the proximal CBD that could be concerning for stricture.  CT A/P showed a large fluid collection next to the stomach from her previous  gastric sleeve surgery.  Upper GI series confirmed a leak/contained perforation of the gastric fundus presumably due to suture dehiscence from prior sleeve gastrectomy.  CT-guided drain placement into the perigastric fluid collection was performed on 8/2. Plan is for EGD on Monday with Dr. Meridee Score to better visualize the contained perforation and see if there are any endoscopic interventions that would be possible.  Patient is agreeable to this. Patient's LFTs continue to downtrend.  - Continue to trend LFTs - Continue to monitor drain output - Plan for EGD with possible gastric stent placement on Monday with Dr. Meridee Score - Follow surgery recommendations - If patient's LFTs were to increase again in the future, then would consider PBD placement.   LOS: 9 days   Frances Rodgers 06/17/2023, 12:45 PM

## 2023-06-17 NOTE — Progress Notes (Signed)
Patient ID: Frances Rodgers, female   DOB: 07-12-1971, 52 y.o.   MRN: [id number]      Subjective: Sme pain at IR drain site ROS negative except as listed above. Objective: Vital signs in last 24 hours: Temp:  [97.9 F (36.6 C)-98.4 F (36.9 C)] 97.9 F (36.6 C) (08/03 0801) Pulse Rate:  [95-109] 95 (08/03 0801) Resp:  [15-21] 18 (08/03 0801) BP: (103-131)/(61-80) 103/61 (08/03 0801) SpO2:  [96 %-100 %] 96 % (08/03 0801) Weight:  [101.3 kg] 101.3 kg (08/03 0500) Last BM Date : 06/16/23  Intake/Output from previous day: 08/02 0701 - 08/03 0700 In: 768.2 [I.V.:664.7; IV Piggyback:103.4] Out: 280 [Drains:280] Intake/Output this shift: Total I/O In: 373.2 [I.V.:216; IV Piggyback:157.2] Out: 30 [Drains:30]  General appearance: alert and cooperative GI: soft, LUQ IR drain purulent SS, no generalized tenderness  Lab Results: CBC  Recent Labs    06/15/23 0237 06/17/23 0454  WBC 6.4 4.6  HGB 8.9* 8.4*  HCT 29.3* 28.2*  PLT 251 190   BMET Recent Labs    06/15/23 0237 06/17/23 0454  NA 130* 133*  K 3.8 3.5  CL 96* 102  CO2 24 26  GLUCOSE 192* 296*  BUN 15 10  CREATININE 0.62 0.78  CALCIUM 8.3* 8.2*   PT/INR Recent Labs    06/15/23 0237 06/16/23 1136  LABPROT 18.9* 17.3*  INR 1.6* 1.4*   ABG No results for input(s): "PHART", "HCO3" in the last 72 hours.  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 EXAM: CT-guided drain placement MEDICATIONS: The patient is currently admitted to the hospital and receiving intravenous antibiotics. The antibiotics were administered within an appropriate time frame prior to the initiation of the procedure. ANESTHESIA/SEDATION: Moderate (conscious) sedation was employed during this procedure. A total of Versed 2 mg and Fentanyl 100 mcg was administered  intravenously by the radiology nurse. Total intra-service moderate Sedation Time: 17 minutes. The patient's level of consciousness and vital signs were monitored continuously by radiology nursing throughout the procedure under my direct supervision. COMPLICATIONS: None immediate. PROCEDURE: Informed written consent was obtained from the patient after a thorough discussion of the procedural risks, benefits and alternatives. All questions were addressed. Maximal Sterile Barrier Technique was utilized including caps, mask, sterile gowns, sterile gloves, sterile drape, hand hygiene and skin antiseptic. A timeout was performed prior to the initiation of the procedure. CT imaging was performed. The fluid and gas collection in the left upper quadrant was identified. A suitable skin entry site was selected and marked. Local anesthesia was attained by infiltration with 1% lidocaine. A small dermatotomy was made. Under intermittent CT guidance, an 18 gauge trocar needle was carefully advanced into the collection. A 0.035 wire was coiled in the collection. The percutaneous tract was dilated to 12 [place]. A 12 French all-purpose drainage catheter was advanced over the wire and formed. Aspiration yields approximately 250 mL of purulent fluid. A sample was sent for Gram stain and culture. The drain was flushed and connected to JP bulb suction before being secured to the skin with 0 Prolene suture. Follow-up CT imaging demonstrates a well-positioned drainage catheter and no evidence of complication. IMPRESSION: Successful placement of 12 French drainage catheter into the left upper quadrant abscess. Electronically Signed   By: Malachy Moan M.D.   On: 06/16/2023 15:32   [place] EKG SITE RITE  Result Date: 06/15/2023 If Site  Rite image not attached, placement could not be confirmed due to current cardiac rhythm.   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   - will determine further plans after this is completed. - continue strict NPO, TPN - Surgical intervention is not indicated right now as the ultimate treatment will be decompressing the narrowing with EGD, +/- stenting (if this is etiology) and then after the leak heals possible conversion to a roux-en-y bypass. - the patient is agreeable to EGD on Monday.   - pain medication has been adjusted after JP placement - appreciate TRH, GI and IR care   FEN: NPO/TNA VTE: LMWH ID: zosyn   - per TRH -  Asthma Recent dx of IDDM, DKA, resolved AKI, resolved Transaminitis, improving  LOS: 9 days    Frances Gelinas, MD, MPH, FACS Trauma & General Surgery Use [email or web address] to contact on call provider  06/17/2023

## 2023-06-17 NOTE — Progress Notes (Signed)
Frances Rodgers  ZOX:[id number] DOB: May 29, 1971 DOA: 06/07/2023 PCP: Tollie Eth, NP    Brief Narrative:  52 year old with a history of mild intermittent asthma and recently diagnosed DM who was brought to the ER by a family member with altered mental status.  Workup revealed elevated LFTs.  Significant Events: 8/2 percutaneous IR drain and L UQ collection yielding several 100 mL of purulent material 8/2 PICC line placed  Goals of Care:   Code Status: Full Code   DVT prophylaxis: enoxaparin (LOVENOX) injection 40 mg Start: 06/07/23 2000  Interim Hx: The patient underwent percutaneous drainage of her perigastric fluid collection in IR yesterday, and had a PICC line placed.  There were no acute events recorded overnight.  She is presently afebrile with stable vitals.  CBG is above goal range.  She is hypomagnesemic.  To undergo EGD Monday.  She is resting comfortably in bed.  She is frustrated that she still has to be in the hospital, but she understands why.  She denies new complaints today.  She says her pain is well-controlled at present.  Assessment & Plan:  9.2 cm collection of gas and fluid left upper quadrant Incidentally noted on MRI abdomen -follow-up CT confirmed this finding with appearance worrisome for possible contained perforation given proximity to previous suture line from gastric bypass procedure -General Surgery coordinating care with the GI team - continue NPO status with TNA support - s/p IR drainage of the fluid collection -for EGD to investigate internal anatomy 8/5  Acute metabolic encephalopathy of unclear etiology Possibly related to profound acid-base abnormalities/acute metabolic acidosis/respiratory alkalosis - UDS negative -UA without evidence of UTI -CXR with no clear infiltrate -thyroid function recently confirmed to be within normal limits -resolved  Acute transaminitis Unclear etiology -US revealed no obstructive gallstones and no suggestion of acute  cholecystitis -CT abdomen 7/25 without acute findings -viral hepatitis panel negative -possible shock liver versus drug-induced liver injury -AMA and ASMA negative -CMV IgM negative -MRCP 7/21 revealed a 9.2 x 7.0 x 7.2 cm collection of gas and fluid in the left upper quadrant lateral to the stomach of indeterminate etiology as well as mild intrahepatic bile duct dilatation with a focal area of narrowing involving the proximal common bile duct possibly representing a stricture -LFTs continue to improve and are now approaching normal  Recent Labs  Lab 06/12/23 0323 06/13/23 0020 06/14/23 0405 06/15/23 0237 06/17/23 0454  AST 943* 244* 93* 55* 31  ALT 895* 635* 422* 237* 113*  ALKPHOS 258* 289* 303* 263* 194*  BILITOT 4.1* 4.6* 4.9* 3.7* 2.8*  PROT 5.8* 5.9* 6.6 5.8* 5.8*  ALBUMIN 2.5* 2.3* 2.4* 2.0* 1.8*    Acute kidney injury Felt to be due to poor intake/dehydration -now resolved  DM2 with starvation ketoacidosis CBG well-controlled with patient presently n.p.o.  Peripheral edema Venous duplex negative for DVT  Chronic mild intermittent asthma Stable at present  Obesity - Body mass index is 37.16 kg/m.  Status post lap band and subsequent removal   Family Communication: No family present at time of exam Disposition: Discharge home will be delayed   Objective: Blood pressure 103/61, pulse 95, temperature 97.9 F (36.6 C), temperature source Oral, resp. rate 18, height 5\' 5"  (1.651 m), weight 101.3 kg, last menstrual period 06/08/2023, SpO2 96%.  Intake/Output Summary (Last 24 hours) at 06/17/2023 1003 Last data filed at 06/17/2023 0600 Gross per 24 hour  Intake 768.15 ml  Output 280 ml  Net 488.15 ml   Ceasar Mons  Weights   06/16/23 0433 06/17/23 0500  Weight: 101 kg 101.3 kg    Examination: General: No acute respiratory distress Lungs: Clear to auscultation bilaterally without wheezes or crackles Cardiovascular: Regular rate and rhythm without murmur gallop or rub  normal S1 and S2 Abdomen: Soft, bowel sounds positive, drain in place with no apparent immediate complications, no rebound Extremities: No significant cyanosis, clubbing, or edema bilateral lower extremities   CBC: Recent Labs  Lab 06/14/23 0405 06/15/23 0237 06/17/23 0454  WBC 6.9 6.4 4.6  HGB 10.8* 8.9* 8.4*  HCT 34.8* 29.3* 28.2*  MCV 78.9* 78.6* 84.9  PLT 268 251 190   Basic Metabolic Panel: Recent Labs  Lab 06/14/23 0405 06/15/23 0237 06/17/23 0454  NA 133* 130* 133*  K 4.4 3.8 3.5  CL 92* 96* 102  CO2 27 24 26   GLUCOSE 237* 192* 296*  BUN 21* 15 10  CREATININE 0.84 0.62 0.78  CALCIUM 9.0 8.3* 8.2*  MG  --   --  1.5*  PHOS  --   --  2.8   GFR: Estimated Creatinine Clearance: 99.2 mL/min (by C-G formula based on SCr of 0.78 mg/dL).   Scheduled Meds:  budesonide (PULMICORT) nebulizer solution  0.25 mg Nebulization BID   Chlorhexidine Gluconate Cloth  6 each Topical Daily   enoxaparin (LOVENOX) injection  40 mg Subcutaneous Q24H   insulin aspart  0-20 Units Subcutaneous Q4H   insulin aspart  5 Units Subcutaneous Q4H   lidocaine (PF)  10 mL Other Once   pantoprazole (PROTONIX) IV  40 mg Intravenous Q12H   sodium chloride flush  10-40 mL Intracatheter Q12H   sodium chloride flush  5 mL Intracatheter Q8H      LOS: 9 days   Lonia Blood, MD [facility] Office  [phone] Pager - Text Page per [name]  If 7PM-7AM, please contact night-coverage per Amion 06/17/2023, 10:03 AM

## 2023-06-17 NOTE — Progress Notes (Signed)
Referring Provider(s): [name]  Supervising Physician: Mir, Mauri Reading  Patient Status:  [name] - In-pt  Chief Complaint:  Gastric perf -s/p drain  Brief History:  Frances Rodgers is a 52 y.o. female who presented with several week history of abdominal pain.   CT Abdomen Pelvis yesterday showed a large fluid collection adjacent to the stomach.   UGI performed today showed contrast extravasation concerning for perforation.    She underwent placement of a drain yesterday by Dr. Archer Asa.   Subjective:  Lying in bed. Asking for pain meds.  Allergies: Aspirin, Ketorolac, and Ketorolac tromethamine  Medications: Prior to Admission medications   Medication Sig Start Date End Date Taking? Authorizing Provider  albuterol (VENTOLIN HFA) 108 (90 Base) MCG/ACT inhaler Inhale 2 puffs into the lungs every 6 (six) hours as needed for wheezing or shortness of breath.   Yes [provider]  Cetirizine HCl 10 MG CAPS Take 10 mg by mouth daily as needed (allergy). 06/02/03  Yes [provider]  FLOVENT DISKUS 50 MCG/ACT AEPB Take 1 Inhalation by mouth every 12 (twelve) hours as needed (SOB).   Yes [provider]  insulin glargine (LANTUS) 100 UNIT/ML Solostar Pen Inject 22 Units into the skin at bedtime. May substitute as needed per insurance. Patient taking differently: Inject 22 Units into the skin at bedtime. 06/01/23 08/30/23 Yes Darlin Priestly, MD  insulin lispro (HUMALOG) 100 UNIT/ML KwikPen Inject 6 Units into the skin 3 (three) times daily with meals. Only take if eating a meal AND Blood Glucose (BG) is 80 or higher. Patient taking differently: Inject 6 Units into the skin 3 (three) times daily with meals. 06/01/23 08/30/23 Yes Darlin Priestly, MD  JUNEL FE 24 1-20 MG-MCG(24) tablet TAKE 1 TABLET BY MOUTH DAILY Patient taking differently: Take 1 tablet by mouth at bedtime. 02/14/23  Yes Hildred Laser, MD  montelukast (SINGULAIR) 10 MG tablet Take 10 mg by mouth at  bedtime.   Yes [provider]  Multiple Vitamin (MULTI VITAMIN DAILY PO) Take 1 tablet by mouth daily.   Yes [provider]  pantoprazole (PROTONIX) 40 MG tablet Take 40 mg by mouth daily as needed (acid reflex). 04/12/19  Yes [provider]  QVAR REDIHALER 40 MCG/ACT inhaler Inhale 1 puff into the lungs 2 (two) times daily. 12/09/22  Yes [provider]  Blood Glucose Monitoring Suppl DEVI 1 each by Does not apply route 3 (three) times daily. May dispense any manufacturer covered by patient's insurance. 06/01/23   Darlin Priestly, MD  Glucose Blood (BLOOD GLUCOSE TEST STRIPS) STRP 1 each by Does not apply route 3 (three) times daily. Use as directed to check blood sugar. May dispense any manufacturer covered by patient's insurance and fits patient's device. 06/01/23   Darlin Priestly, MD  Insulin Pen Needle (PEN NEEDLES) 31G X 5 MM MISC 1 each by Does not apply route 3 (three) times daily. May dispense any manufacturer covered by patient's insurance. 06/01/23   Darlin Priestly, MD  Lancet Device MISC 1 each by Does not apply route 3 (three) times daily. May dispense any manufacturer covered by patient's insurance. 06/01/23   Darlin Priestly, MD  Lancets MISC 1 each by Does not apply route 3 (three) times daily. Use as directed to check blood sugar. May dispense any manufacturer covered by patient's insurance and fits patient's device. 06/01/23   Darlin Priestly, MD     Vital Signs: BP 103/61 (BP Location: Left Arm)   Pulse 95  Temp 97.9 F (36.6 C) (Oral)   Resp 18   Ht 5\' 5"  (1.651 m)   Wt 223 lb 5.2 oz (101.3 kg)   LMP 06/08/2023   SpO2 96%   BMI 37.16 kg/m   Physical Exam Vitals reviewed.  Cardiovascular:     Rate and Rhythm: Normal rate.  Pulmonary:     Effort: Pulmonary effort is normal. No respiratory distress.  Neurological:     Mental Status: She is alert.    Drain Location: Upper abdomen Size: Fr size: 12 Fr Date of placement: 06/16/23  Currently to: Drain collection  device: suction bulb 24 hour output:  Output by Drain (mL) 06/15/23 0701 - 06/15/23 1900 06/15/23 1901 - 06/16/23 0700 06/16/23 0701 - 06/16/23 1900 06/16/23 1901 - 06/17/23 0700 06/17/23 0701 - 06/17/23 1428  Closed System Drain 1 Left LUQ Bulb (JP) 10 Fr.   100 180 55   Current examination: Flushes/aspirates easily.  Insertion site unremarkable. Suture and stat lock in place. Dressed appropriately.   Labs:  CBC: Recent Labs    06/12/23 0323 06/14/23 0405 06/15/23 0237 06/17/23 0454  WBC 11.5* 6.9 6.4 4.6  HGB 9.8* 10.8* 8.9* 8.4*  HCT 32.1* 34.8* 29.3* 28.2*  PLT 262 268 251 190    COAGS: Recent Labs    06/13/23 0020 06/14/23 0405 06/15/23 0237 06/16/23 1136  INR 2.2* 1.5* 1.6* 1.4*    BMP: Recent Labs    06/13/23 0020 06/14/23 0405 06/15/23 0237 06/17/23 0454  NA 132* 133* 130* 133*  K 4.4 4.4 3.8 3.5  CL 96* 92* 96* 102  CO2 26 27 24 26   GLUCOSE 252* 237* 192* 296*  BUN 19 21* 15 10  CALCIUM 8.4* 9.0 8.3* 8.2*  CREATININE 0.92 0.84 0.62 0.78  GFRNONAA >60 >60 >60 >60    LIVER FUNCTION TESTS: Recent Labs    06/13/23 0020 06/14/23 0405 06/15/23 0237 06/17/23 0454  BILITOT 4.6* 4.9* 3.7* 2.8*  AST 244* 93* 55* 31  ALT 635* 422* 237* 113*  ALKPHOS 289* 303* 263* 194*  PROT 5.9* 6.6 5.8* 5.8*  ALBUMIN 2.3* 2.4* 2.0* 1.8*    Assessment and Plan:  Gastric perf - s/p drain by Dr. Archer Asa 06/16/23  Plan: Continue TID flushes with 5 cc NS.  Record output Q shift.  Dressing changes QD or PRN if soiled.   Call IR APP or on call IR MD if difficulty flushing or sudden change in drain output.  Repeat imaging/possible drain injection once output < 10 mL/QD (excluding flush material).   Consideration for drain removal if output is < 10 mL/QD (excluding flush material), there is no further gastric leak, and  pending discussion with the providing surgical service.  Discharge planning: Please contact IR APP or on call IR MD prior to patient d/c  to ensure appropriate follow up plans are in place if patient is discharged from hospital with drain in place.   IR will continue to follow - please call with questions or concerns. Electronically Signed: Gwynneth Macleod, PA-C 06/17/2023, 2:26 PM    I spent a total of 15 Minutes at the the patient's bedside AND on the patient's hospital floor or unit, greater than 50% of which was counseling/coordinating care for f/u drain placement.

## 2023-06-17 NOTE — Plan of Care (Signed)
Problem: Cardiac: Goal: Ability to maintain an adequate cardiac output will improve Outcome: Progressing   Problem: Health Behavior/Discharge Planning: Goal: Ability to identify and utilize available resources and services will improve Outcome: Progressing Goal: Ability to manage health-related needs will improve Outcome: Progressing   Problem: Fluid Volume: Goal: Ability to achieve a balanced intake and output will improve Outcome: Progressing   Problem: Metabolic: Goal: Ability to maintain appropriate glucose levels will improve Outcome: Progressing   Problem: Nutritional: Goal: Maintenance of adequate nutrition will improve Outcome: Progressing Goal: Maintenance of adequate weight for body size and type will improve Outcome: Progressing   Problem: Respiratory: Goal: Will regain and/or maintain adequate ventilation Outcome: Progressing   Problem: Urinary Elimination: Goal: Ability to achieve and maintain adequate renal perfusion and functioning will improve Outcome: Progressing   Problem: Education: Goal: Ability to describe self-care measures that may prevent or decrease complications (Diabetes Survival Skills Education) will improve Outcome: Progressing Goal: Individualized Educational Video(s) Outcome: Progressing   Problem: Coping: Goal: Ability to adjust to condition or change in health will improve Outcome: Progressing   Problem: Fluid Volume: Goal: Ability to maintain a balanced intake and output will improve Outcome: Progressing   Problem: Health Behavior/Discharge Planning: Goal: Ability to identify and utilize available resources and services will improve Outcome: Progressing Goal: Ability to manage health-related needs will improve Outcome: Progressing   Problem: Metabolic: Goal: Ability to maintain appropriate glucose levels will improve Outcome: Progressing   Problem: Nutritional: Goal: Maintenance of adequate nutrition will improve Outcome:  Progressing Goal: Progress toward achieving an optimal weight will improve Outcome: Progressing   Problem: Skin Integrity: Goal: Risk for impaired skin integrity will decrease Outcome: Progressing   Problem: Tissue Perfusion: Goal: Adequacy of tissue perfusion will improve Outcome: Progressing   Problem: Education: Goal: Ability to describe self-care measures that may prevent or decrease complications (Diabetes Survival Skills Education) will improve Outcome: Progressing Goal: Individualized Educational Video(s) Outcome: Progressing   Problem: Coping: Goal: Ability to adjust to condition or change in health will improve Outcome: Progressing   Problem: Fluid Volume: Goal: Ability to maintain a balanced intake and output will improve Outcome: Progressing   Problem: Health Behavior/Discharge Planning: Goal: Ability to identify and utilize available resources and services will improve Outcome: Progressing Goal: Ability to manage health-related needs will improve Outcome: Progressing   Problem: Metabolic: Goal: Ability to maintain appropriate glucose levels will improve Outcome: Progressing   Problem: Nutritional: Goal: Maintenance of adequate nutrition will improve Outcome: Progressing Goal: Progress toward achieving an optimal weight will improve Outcome: Progressing   Problem: Skin Integrity: Goal: Risk for impaired skin integrity will decrease Outcome: Progressing   Problem: Tissue Perfusion: Goal: Adequacy of tissue perfusion will improve Outcome: Progressing   Problem: Education: Goal: Knowledge of General Education information will improve Description: Including pain rating scale, medication(s)/side effects and non-pharmacologic comfort measures Outcome: Progressing   Problem: Health Behavior/Discharge Planning: Goal: Ability to manage health-related needs will improve Outcome: Progressing   Problem: Clinical Measurements: Goal: Ability to maintain  clinical measurements within normal limits will improve Outcome: Progressing Goal: Will remain free from infection Outcome: Progressing Goal: Diagnostic test results will improve Outcome: Progressing Goal: Respiratory complications will improve Outcome: Progressing Goal: Cardiovascular complication will be avoided Outcome: Progressing   Problem: Activity: Goal: Risk for activity intolerance will decrease Outcome: Progressing   Problem: Nutrition: Goal: Adequate nutrition will be maintained Outcome: Progressing   Problem: Coping: Goal: Level of anxiety will decrease Outcome: Progressing   Problem: Elimination: Goal: Will  not experience complications related to bowel motility Outcome: Progressing Goal: Will not experience complications related to urinary retention Outcome: Progressing   Problem: Pain Managment: Goal: General experience of comfort will improve Outcome: Progressing

## 2023-06-17 NOTE — Progress Notes (Signed)
PHARMACY - TOTAL PARENTERAL NUTRITION CONSULT NOTE   Indication:  gastric leak  Patient Measurements: Height: 5\' 5"  (165.1 cm) Weight: 101.3 kg (223 lb 5.2 oz) IBW/kg (Calculated) : 57   Body mass index is 37.16 kg/m. Adj Body Weight: 75 kg Usual Weight: ~97.3 kg  Assessment:  52 yo F with PMH of T2DM, asthma, obesity s/p gastric sleeve in 2015, and GERD. Pt presented on 7/24 with AMS and was found to have metabolic acidosis and respiratory alkalosis with uncontrolled BG. LFTs were elevated on admission and GI was consulted. LFTs initially improved, then peaked on 06/11/23. On 8/1, UGI showed a gastric leak likely due to suture dehiscence of previous gastric sleeve performed in 2015. Surgery was consulted on 8/1 and patient was made NPO. A 10mL fluid collection was also noted adjacent to the stable line and the gastric perforation. IR has been consulted for drain placement. Pharmacy was consulted for TPN initiation on 8/2 due to gastric leak to provide nutrition.  Per patient, usual weight is 225lbs and has not lost weight recently to her knowledge. She was eating about 50% of meals last week, mainly because she did not like the food here. Normally she eats 3-4 meals a day at home including fruits, vegetables, hamburgers, [place] etc/   Glucose / Insulin: T2DM, A1c 11.7% (05/27/23), BG 183-266 since TPN hung, used 29 units insulin since TPN hung -PTA used insulin glargine 22 units at bedtime and insulin lispro 6 units TID Electrolytes: NA 133, K 3.5, CoCa 10, Mg 1.5 others wnl Renal: SCr 0.78, BUN WNL Hepatic: alk phos/ ALT/Tbili trending down,  AST wnl, albumin 1.8 Intake / Output; MIVF: UOP x3 documented, drains , LBM 8/1  GI Imaging: 7/24 KUB: cholelithiasis, hepatic steatosis  7/25 CT: no acute findings  7/30 MRI: LUQ gas and fluid collection 7/31 CT: large LUQ gas and fluid collection concerning for contained perforation/leak or abscess  8/1 UGI: gastric leak likely due to suture  dehiscence from prior sleeve gastrectomy GI Surgeries / Procedures: 8/2 Drain to LUQ abscess   Central access: PICC placed 06/16/23 TPN start date: 06/16/23  Nutritional Goals: Goal TPN rate is 85 mL/hr (provides 110 g of protein and 2133 kcals per day)  RD Assessment: pending Estimated Needs Total Energy Estimated Needs: 2100-2300 Total Protein Estimated Needs: 110-130 grams Total Fluid Estimated Needs: >2.0 L  Current Nutrition:  NPO + TPN   Plan:  Advance TPN to 60 ml/hr, provides 78 g of protein and 1505 kcal meeting 70% estimated daily caloric needs Electrolytes in TPN: increase Na 125 mEq/L,increase K 65 mEq/L, Ca 4 mEq/L, increase Mg 10 mEq/L, increase Phos 18 mmol/L. Cl:Ac 2:1 Add standard MVI and trace elements to TPN Give Kcl IV 30 mEq Give Mg IV 2g Add 5 units q4hr x3 doses and continue Resistant q4h SSI   Add 40 units of insulin regular in TPN Monitor TPN labs daily until stable at goal then on Mon/Thurs F/u 8/3 EGD with possible feeding tube placement   Alphia Moh, PharmD, BCPS, [facility] Clinical Pharmacist  Please check AMION for all [facility] phone numbers After 10:00 PM, call Main Pharmacy [phone]

## 2023-06-18 DIAGNOSIS — R7401 Elevation of levels of liver transaminase levels: Secondary | ICD-10-CM | POA: Diagnosis not present

## 2023-06-18 LAB — GLUCOSE, CAPILLARY
Glucose-Capillary: 182 mg/dL — ABNORMAL HIGH (ref 70–99)
Glucose-Capillary: 191 mg/dL — ABNORMAL HIGH (ref 70–99)
Glucose-Capillary: 205 mg/dL — ABNORMAL HIGH (ref 70–99)
Glucose-Capillary: 207 mg/dL — ABNORMAL HIGH (ref 70–99)
Glucose-Capillary: 211 mg/dL — ABNORMAL HIGH (ref 70–99)
Glucose-Capillary: 223 mg/dL — ABNORMAL HIGH (ref 70–99)

## 2023-06-18 LAB — PHOSPHORUS: Phosphorus: 2.3 mg/dL — ABNORMAL LOW (ref 2.5–4.6)

## 2023-06-18 LAB — BASIC METABOLIC PANEL WITH GFR
Anion gap: 7 (ref 5–15)
BUN: 11 mg/dL (ref 6–20)
CO2: 22 mmol/L (ref 22–32)
Calcium: 8.7 mg/dL — ABNORMAL LOW (ref 8.9–10.3)
Chloride: 107 mmol/L (ref 98–111)
Creatinine, Ser: 0.68 mg/dL (ref 0.44–1.00)
GFR, Estimated: >60 mL/min (ref >60)
Glucose, Bld: 204 mg/dL — ABNORMAL HIGH (ref 70–99)
Potassium: 4.1 mmol/L (ref 3.5–5.1)
Sodium: 136 mmol/L (ref 135–145)

## 2023-06-18 LAB — MAGNESIUM: Magnesium: 1.8 mg/dL (ref 1.7–2.4)

## 2023-06-18 MED ORDER — INSULIN ASPART 100 UNIT/ML IJ SOLN
0.0000 [IU] | Freq: Four times a day (QID) | INTRAMUSCULAR | Status: DC
  Administered 2023-06-18: 5 [IU] via SUBCUTANEOUS
  Administered 2023-06-19 (×2): 3 [IU] via SUBCUTANEOUS

## 2023-06-18 MED ORDER — SODIUM PHOSPHATES 45 MMOLE/15ML IV SOLN
15.0000 mmol | Freq: Once | INTRAVENOUS | Status: AC
  Administered 2023-06-18: 15 mmol via INTRAVENOUS
  Filled 2023-06-18: qty 5

## 2023-06-18 MED ORDER — TRAVASOL 10 % IV SOLN
INTRAVENOUS | Status: AC
  Filled 2023-06-18: qty 1142.4

## 2023-06-18 NOTE — Plan of Care (Signed)
Problem: Health Behavior/Discharge Planning: Goal: Ability to identify and utilize available resources and services will improve Outcome: Progressing Goal: Ability to manage health-related needs will improve Outcome: Progressing   Problem: Fluid Volume: Goal: Ability to achieve a balanced intake and output will improve Outcome: Progressing   Problem: Metabolic: Goal: Ability to maintain appropriate glucose levels will improve Outcome: Progressing   Problem: Nutritional: Goal: Maintenance of adequate nutrition will improve Outcome: Progressing Goal: Maintenance of adequate weight for body size and type will improve Outcome: Progressing   Problem: Respiratory: Goal: Will regain and/or maintain adequate ventilation Outcome: Progressing   Problem: Urinary Elimination: Goal: Ability to achieve and maintain adequate renal perfusion and functioning will improve Outcome: Progressing   Problem: Education: Goal: Ability to describe self-care measures that may prevent or decrease complications (Diabetes Survival Skills Education) will improve Outcome: Progressing Goal: Individualized Educational Video(s) Outcome: Progressing   Problem: Coping: Goal: Ability to adjust to condition or change in health will improve Outcome: Progressing   Problem: Fluid Volume: Goal: Ability to maintain a balanced intake and output will improve Outcome: Progressing   Problem: Health Behavior/Discharge Planning: Goal: Ability to identify and utilize available resources and services will improve Outcome: Progressing Goal: Ability to manage health-related needs will improve Outcome: Progressing   Problem: Metabolic: Goal: Ability to maintain appropriate glucose levels will improve Outcome: Progressing   Problem: Nutritional: Goal: Maintenance of adequate nutrition will improve Outcome: Progressing Goal: Progress toward achieving an optimal weight will improve Outcome: Progressing   Problem:  Skin Integrity: Goal: Risk for impaired skin integrity will decrease Outcome: Progressing   Problem: Tissue Perfusion: Goal: Adequacy of tissue perfusion will improve Outcome: Progressing   Problem: Education: Goal: Ability to describe self-care measures that may prevent or decrease complications (Diabetes Survival Skills Education) will improve Outcome: Progressing Goal: Individualized Educational Video(s) Outcome: Progressing   Problem: Coping: Goal: Ability to adjust to condition or change in health will improve Outcome: Progressing   Problem: Fluid Volume: Goal: Ability to maintain a balanced intake and output will improve Outcome: Progressing   Problem: Health Behavior/Discharge Planning: Goal: Ability to identify and utilize available resources and services will improve Outcome: Progressing Goal: Ability to manage health-related needs will improve Outcome: Progressing   Problem: Metabolic: Goal: Ability to maintain appropriate glucose levels will improve Outcome: Progressing   Problem: Nutritional: Goal: Maintenance of adequate nutrition will improve Outcome: Progressing Goal: Progress toward achieving an optimal weight will improve Outcome: Progressing   Problem: Skin Integrity: Goal: Risk for impaired skin integrity will decrease Outcome: Progressing   Problem: Tissue Perfusion: Goal: Adequacy of tissue perfusion will improve Outcome: Progressing   Problem: Education: Goal: Knowledge of General Education information will improve Description: Including pain rating scale, medication(s)/side effects and non-pharmacologic comfort measures Outcome: Progressing   Problem: Health Behavior/Discharge Planning: Goal: Ability to manage health-related needs will improve Outcome: Progressing   Problem: Clinical Measurements: Goal: Ability to maintain clinical measurements within normal limits will improve Outcome: Progressing Goal: Will remain free from  infection Outcome: Progressing Goal: Diagnostic test results will improve Outcome: Progressing Goal: Respiratory complications will improve Outcome: Progressing Goal: Cardiovascular complication will be avoided Outcome: Progressing   Problem: Activity: Goal: Risk for activity intolerance will decrease Outcome: Progressing   Problem: Nutrition: Goal: Adequate nutrition will be maintained Outcome: Progressing   Problem: Coping: Goal: Level of anxiety will decrease Outcome: Progressing   Problem: Elimination: Goal: Will not experience complications related to bowel motility Outcome: Progressing Goal: Will not experience complications related to  urinary retention Outcome: Progressing   Problem: Pain Managment: Goal: General experience of comfort will improve Outcome: Progressing   Problem: Safety: Goal: Ability to remain free from injury will improve Outcome: Progressing

## 2023-06-18 NOTE — Progress Notes (Signed)
Patient ID: Frances Rodgers, female   DOB: October 09, 1971, 52 y.o.   MRN: [id number]      Subjective: Doing OK ROS negative except as listed above. Objective: Vital signs in last 24 hours: Temp:  [97.6 F (36.4 C)-98.2 F (36.8 C)] 98 F (36.7 C) (08/04 0736) Pulse Rate:  [89-95] 95 (08/04 0736) Resp:  [18] 18 (08/04 0736) BP: (109-125)/(60-66) 109/60 (08/04 0736) SpO2:  [98 %-100 %] 100 % (08/04 0736) Last BM Date : 06/17/23  Intake/Output from previous day: 08/03 0701 - 08/04 0700 In: 1507.7 [I.V.:1245.4; IV Piggyback:257.2] Out: 130 [Drains:130] Intake/Output this shift: Total I/O In: 215.1 [I.V.:183; IV Piggyback:32.1] Out: -   General appearance: alert and cooperative Resp: clear to auscultation bilaterally GI: soft, NT, LUQ JP purulent SS  Lab Results: CBC  Recent Labs    06/17/23 0454  WBC 4.6  HGB 8.4*  HCT 28.2*  PLT 190   BMET Recent Labs    06/17/23 0454 06/18/23 0338  NA 133* 136  K 3.5 4.1  CL 102 107  CO2 26 22  GLUCOSE 296* 204*  BUN 10 11  CREATININE 0.78 0.68  CALCIUM 8.2* 8.7*   PT/INR Recent Labs    06/16/23 1136  LABPROT 17.3*  INR 1.4*   ABG No results for input(s): "PHART", "HCO3" in the last 72 hours.  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 EXAM: CT-guided drain placement MEDICATIONS: The patient is currently admitted to the hospital and receiving intravenous antibiotics. The antibiotics were administered within an appropriate time frame prior to the initiation of the procedure. ANESTHESIA/SEDATION: Moderate (conscious) sedation was employed during this procedure. A total of Versed 2 mg and Fentanyl 100 mcg was administered intravenously by the radiology nurse. Total intra-service moderate Sedation Time: 17 minutes. The patient's level of  consciousness and vital signs were monitored continuously by radiology nursing throughout the procedure under my direct supervision. COMPLICATIONS: None immediate. PROCEDURE: Informed written consent was obtained from the patient after a thorough discussion of the procedural risks, benefits and alternatives. All questions were addressed. Maximal Sterile Barrier Technique was utilized including caps, mask, sterile gowns, sterile gloves, sterile drape, hand hygiene and skin antiseptic. A timeout was performed prior to the initiation of the procedure. CT imaging was performed. The fluid and gas collection in the left upper quadrant was identified. A suitable skin entry site was selected and marked. Local anesthesia was attained by infiltration with 1% lidocaine. A small dermatotomy was made. Under intermittent CT guidance, an 18 gauge trocar needle was carefully advanced into the collection. A 0.035 wire was coiled in the collection. The percutaneous tract was dilated to 12 [place]. A 12 French all-purpose drainage catheter was advanced over the wire and formed. Aspiration yields approximately 250 mL of purulent fluid. A sample was sent for Gram stain and culture. The drain was flushed and connected to JP bulb suction before being secured to the skin with 0 Prolene suture. Follow-up CT imaging demonstrates a well-positioned drainage catheter and no evidence of complication. IMPRESSION: Successful placement of 12 French drainage catheter into the left upper quadrant abscess. Electronically Signed   By: Malachy Moan M.D.   On: 06/16/2023 15:32    Anti-infectives: Anti-infectives (From admission, onward)    Start     Dose/Rate Route Frequency Ordered Stop   06/16/23 1730  piperacillin-tazobactam (ZOSYN) IVPB 3.375 g  3.375 g 12.5 mL/hr over 240 Minutes Intravenous Every 8 hours 06/16/23 1634         Assessment/Plan: Hx of prior gastric sleeve at [facility] in 2015 10 cm fluid collection adjacent to  staple line secondary to a contained gastric perforation - s/p IR drain into LUQ collection 8/2 >> several hundred mL purulent material aspirated, culture pending - Zosyn IV - GI following with plans for EGD on Monday to assess the stomach and help determine etiology of her leak, suspect secondary to narrowing near her angularis.   - will determine further plans after this is completed. - continue strict NPO, TPN - Surgical intervention is not indicated right now as the ultimate treatment will be decompressing the narrowing with EGD, +/- stenting (if this is etiology) and then after the leak heals possible conversion to a roux-en-y bypass. - the patient is agreeable to EGD on Monday.   - pain medication has been adjusted after JP placement - appreciate TRH, GI and IR care   FEN: NPO/TNA VTE: LMWH ID: zosyn   - per TRH -  Asthma Recent dx of IDDM, DKA, resolved AKI, resolved Transaminitis, improving  LOS: 10 days    [name], MD, MPH, FACS Trauma & General Surgery Use [email or web address] to contact on call provider  06/18/2023

## 2023-06-18 NOTE — Progress Notes (Signed)
Frances Rodgers  WUJ:[id number] DOB: 18-Jan-1971 DOA: 06/07/2023 PCP: Tollie Eth, NP    Brief Narrative:  52 year old with a history of mild intermittent asthma and recently diagnosed DM who was brought to the ER by a family member with altered mental status.  Workup revealed elevated LFTs.  Significant Events: 8/2 percutaneous IR drain and L UQ collection yielding several 100 mL of purulent material 8/2 PICC line placed  Goals of Care:   Code Status: Full Code   DVT prophylaxis: enoxaparin (LOVENOX) injection 40 mg Start: 06/07/23 2000  Interim Hx: No acute events recorded overnight.  Afebrile.  Vital signs stable.  CBG reasonably controlled.  Magnesium corrected.  No new complaints today.  Affect is flat.  Assessment & Plan:  9.2 cm collection of gas and fluid left upper quadrant Incidentally noted on MRI abdomen -follow-up CT confirmed this finding with appearance worrisome for possible contained perforation given proximity to previous suture line from gastric bypass procedure -General Surgery coordinating care with the GI team - continue NPO status with TNA support - s/p IR drainage of the fluid collection -for EGD to investigate internal anatomy 8/5  Acute metabolic encephalopathy of unclear etiology - resolved  Possibly related to profound acid-base abnormalities/acute metabolic acidosis/respiratory alkalosis - UDS negative -UA without evidence of UTI -CXR with no clear infiltrate -thyroid function recently confirmed to be within normal limits -resolved  Acute transaminitis Unclear etiology -US revealed no obstructive gallstones and no suggestion of acute cholecystitis -CT abdomen 7/25 without acute findings -viral hepatitis panel negative -possible shock liver versus drug-induced liver injury -AMA and ASMA negative -CMV IgM negative -MRCP 7/21 revealed mild intrahepatic bile duct dilatation with a focal area of narrowing involving the proximal common bile duct possibly  representing a stricture -LFTs continue to improve and are now approaching normal - further eval of the possible stricture will be advisable if her transaminitis recurs  Recent Labs  Lab 06/12/23 0323 06/13/23 0020 06/14/23 0405 06/15/23 0237 06/17/23 0454  AST 943* 244* 93* 55* 31  ALT 895* 635* 422* 237* 113*  ALKPHOS 258* 289* 303* 263* 194*  BILITOT 4.1* 4.6* 4.9* 3.7* 2.8*  PROT 5.8* 5.9* 6.6 5.8* 5.8*  ALBUMIN 2.5* 2.3* 2.4* 2.0* 1.8*    Acute kidney injury - resolved  Felt to be due to poor intake/dehydration   DM2 with starvation ketoacidosis CBG well-controlled with patient presently n.p.o.  Peripheral edema Venous duplex negative for DVT  Chronic mild intermittent asthma Stable at present  Obesity - Body mass index is 37.16 kg/m.  Status post lap band and subsequent removal   Family Communication: No family present at time of exam Disposition:   Objective: Blood pressure 109/60, pulse 95, temperature 98 F (36.7 C), temperature source Oral, resp. rate 18, height 5\' 5"  (1.651 m), weight 101.3 kg, last menstrual period 06/08/2023, SpO2 100%.  Intake/Output Summary (Last 24 hours) at 06/18/2023 0953 Last data filed at 06/18/2023 9562 Gross per 24 hour  Intake 1722.79 ml  Output 130 ml  Net 1592.79 ml   Filed Weights   06/16/23 0433 06/17/23 0500  Weight: 101 kg 101.3 kg    Examination: General: No acute respiratory distress Lungs: Clear to auscultation bilaterally without wheezes or crackles Cardiovascular: Regular rate and rhythm without murmur gallop or rub normal S1 and S2 Abdomen: NT/ND, soft, BS positive Extremities: Trace edema bilateral lower extremities   CBC: Recent Labs  Lab 06/14/23 0405 06/15/23 0237 06/17/23 0454  WBC 6.9 6.4 4.6  HGB 10.8* 8.9* 8.4*  HCT 34.8* 29.3* 28.2*  MCV 78.9* 78.6* 84.9  PLT 268 251 190   Basic Metabolic Panel: Recent Labs  Lab 06/15/23 0237 06/17/23 0454 06/18/23 0338  NA 130* 133* 136  K 3.8  3.5 4.1  CL 96* 102 107  CO2 24 26 22   GLUCOSE 192* 296* 204*  BUN 15 10 11   CREATININE 0.62 0.78 0.68  CALCIUM 8.3* 8.2* 8.7*  MG  --  1.5* 1.8  PHOS  --  2.8 2.3*   GFR: Estimated Creatinine Clearance: 99.2 mL/min (by C-G formula based on SCr of 0.68 mg/dL).   Scheduled Meds:  Chlorhexidine Gluconate Cloth  6 each Topical Daily   enoxaparin (LOVENOX) injection  40 mg Subcutaneous Q24H   insulin aspart  0-15 Units Subcutaneous Q6H   insulin aspart  0-20 Units Subcutaneous Q4H   lidocaine (PF)  10 mL Other Once   pantoprazole (PROTONIX) IV  40 mg Intravenous Q12H   sodium chloride flush  10-40 mL Intracatheter Q12H   sodium chloride flush  5 mL Intracatheter Q8H      LOS: 10 days   Lonia Blood, MD [facility] Office  [phone] Pager - Text Page per [name]  If 7PM-7AM, please contact night-coverage per Amion 06/18/2023, 9:53 AM

## 2023-06-18 NOTE — Progress Notes (Addendum)
PHARMACY - TOTAL PARENTERAL NUTRITION CONSULT NOTE   Indication:  gastric leak  Patient Measurements: Height: 5\' 5"  (165.1 cm) Weight: 829.5 kg (223 lb 5.2 oz) IBW/kg (Calculated) : 57   Body mass index is 37.16 kg/m. Adj Body Weight: 75 kg Usual Weight: ~97.3 kg  Assessment:  52 yo F with PMH of T2DM, asthma, obesity s/p gastric sleeve in 2015, and GERD. Pt presented on 7/24 with AMS and was found to have metabolic acidosis and respiratory alkalosis with uncontrolled BG. LFTs were elevated on admission and GI was consulted. LFTs initially improved, then peaked on 06/11/23. On 8/1, UGI showed a gastric leak likely due to suture dehiscence of previous gastric sleeve performed in 2015. Surgery was consulted on 8/1 and patient was made NPO. A 10mL fluid collection was also noted adjacent to the stable line and the gastric perforation. IR has been consulted for drain placement. Pharmacy was consulted for TPN initiation on 8/2 due to gastric leak to provide nutrition.  Per patient, usual weight is 225lbs and has not lost weight recently to her knowledge. She was eating about 50% of meals last week, mainly because she did not like the food here. Normally she eats 3-4 meals a day at home including fruits, vegetables, hamburgers, [place] etc/   Glucose / Insulin: T2DM, A1c 11.7% (05/27/23), BG 182-211 since new TPN hung, used 25 units insulin/12 hours since new TPN + 40 units in TPN  -PTA used insulin glargine 22 units at bedtime and insulin lispro 6 units TID Electrolytes: K 4.1 (received 30 mEq IV), CoCa 10.5, Phos 2.3, Mg 1.8 (received 2g IV), others wnl Renal: SCr 0.78, BUN WNL Hepatic: alk phos/ ALT/Tbili trending down,  AST wnl, albumin 1.8 Intake / Output; MIVF: UOP x5 documented, drains , LBM 8/1  GI Imaging: 7/24 KUB: cholelithiasis, hepatic steatosis  7/25 CT: no acute findings  7/30 MRI: LUQ gas and fluid collection 7/31 CT: large LUQ gas and fluid collection concerning for  contained perforation/leak or abscess  8/1 UGI: gastric leak likely due to suture dehiscence from prior sleeve gastrectomy GI Surgeries / Procedures: 8/2 Drain to LUQ abscess  8/5 EGD   Central access: PICC placed 06/16/23 TPN start date: 06/16/23  Nutritional Goals: Goal TPN rate is 85 mL/hr (provides 114 g of protein and 2149 kcals per day)  RD Assessment: pending Estimated Needs Total Energy Estimated Needs: 2100-2300 Total Protein Estimated Needs: 110-130 grams Total Fluid Estimated Needs: >2.0 L  Current Nutrition:  NPO + TPN   Plan:  Advance TPN to goal 85 ml/hr, meeting 100% estimated needs Electrolytes in TPN: Na 125 mEq/L, K 45 mEq/L, decrease Ca 2 mEq/L, increase Mg 12 mEq/L, increase Phos 18 mmol/L. Change Cl:Ac 1:1 Add standard MVI and trace elements to TPN Give NaPhos 15 mmol IV x1 Continue Resistant q4h SSI until 1800 then decrease to moderate SSI q6h Increase to 65 units of insulin regular in TPN Monitor TPN labs daily until stable at goal then on Mon/Thurs F/u 8/5 EGD, possible feeding tube placement   Alphia Moh, PharmD, BCPS, [facility] Clinical Pharmacist  Please check AMION for all [facility] phone numbers After 10:00 PM, call Main Pharmacy [phone]

## 2023-06-19 DIAGNOSIS — R4182 Altered mental status, unspecified: Secondary | ICD-10-CM | POA: Diagnosis not present

## 2023-06-19 DIAGNOSIS — R7989 Other specified abnormal findings of blood chemistry: Secondary | ICD-10-CM | POA: Diagnosis not present

## 2023-06-19 DIAGNOSIS — K3189 Other diseases of stomach and duodenum: Secondary | ICD-10-CM

## 2023-06-19 DIAGNOSIS — E1110 Type 2 diabetes mellitus with ketoacidosis without coma: Secondary | ICD-10-CM

## 2023-06-19 DIAGNOSIS — G9341 Metabolic encephalopathy: Secondary | ICD-10-CM

## 2023-06-19 DIAGNOSIS — J45909 Unspecified asthma, uncomplicated: Secondary | ICD-10-CM

## 2023-06-19 DIAGNOSIS — K839 Disease of biliary tract, unspecified: Secondary | ICD-10-CM | POA: Diagnosis not present

## 2023-06-19 DIAGNOSIS — R7401 Elevation of levels of liver transaminase levels: Secondary | ICD-10-CM | POA: Diagnosis not present

## 2023-06-19 DIAGNOSIS — K255 Chronic or unspecified gastric ulcer with perforation: Secondary | ICD-10-CM | POA: Diagnosis not present

## 2023-06-19 DIAGNOSIS — R112 Nausea with vomiting, unspecified: Secondary | ICD-10-CM

## 2023-06-19 LAB — GLUCOSE, CAPILLARY
Glucose-Capillary: 170 mg/dL — ABNORMAL HIGH (ref 70–99)
Glucose-Capillary: 172 mg/dL — ABNORMAL HIGH (ref 70–99)
Glucose-Capillary: 190 mg/dL — ABNORMAL HIGH (ref 70–99)
Glucose-Capillary: 190 mg/dL — ABNORMAL HIGH (ref 70–99)
Glucose-Capillary: 194 mg/dL — ABNORMAL HIGH (ref 70–99)
Glucose-Capillary: 197 mg/dL — ABNORMAL HIGH (ref 70–99)
Glucose-Capillary: 203 mg/dL — ABNORMAL HIGH (ref 70–99)

## 2023-06-19 MED ORDER — INSULIN ASPART 100 UNIT/ML IJ SOLN
0.0000 [IU] | INTRAMUSCULAR | Status: AC
  Administered 2023-06-19: 7 [IU] via SUBCUTANEOUS
  Administered 2023-06-19 – 2023-06-20 (×4): 4 [IU] via SUBCUTANEOUS
  Administered 2023-06-20: 7 [IU] via SUBCUTANEOUS
  Administered 2023-06-20: 4 [IU] via SUBCUTANEOUS
  Administered 2023-06-21: 3 [IU] via SUBCUTANEOUS
  Administered 2023-06-21 (×2): 4 [IU] via SUBCUTANEOUS
  Administered 2023-06-22: 3 [IU] via SUBCUTANEOUS
  Administered 2023-06-22: 4 [IU] via SUBCUTANEOUS
  Administered 2023-06-22 (×2): 3 [IU] via SUBCUTANEOUS
  Administered 2023-06-22: 4 [IU] via SUBCUTANEOUS
  Administered 2023-06-23: 3 [IU] via SUBCUTANEOUS
  Administered 2023-06-23: 7 [IU] via SUBCUTANEOUS
  Administered 2023-06-23 (×2): 4 [IU] via SUBCUTANEOUS
  Administered 2023-06-23 (×2): 3 [IU] via SUBCUTANEOUS
  Administered 2023-06-24: 4 [IU] via SUBCUTANEOUS
  Administered 2023-06-24: 3 [IU] via SUBCUTANEOUS
  Administered 2023-06-24 (×2): 4 [IU] via SUBCUTANEOUS

## 2023-06-19 MED ORDER — SODIUM CHLORIDE 0.9 % IV SOLN: INTRAVENOUS | Status: DC

## 2023-06-19 MED ORDER — TRAVASOL 10 % IV SOLN
INTRAVENOUS | Status: AC
  Filled 2023-06-19: qty 1142.4

## 2023-06-19 NOTE — Progress Notes (Addendum)
Daily Progress Note  DOA: 06/07/2023 Hospital Day: 13 Chief Complaint: elevated liver tests and ? Gastric perforation  ASSESSMENT & PLAN   Brief Narrative:  Frances Rodgers is a 52 y.o. year old female with a history of  DM, asthma, obesity s/p gastric sleeve in 2015.  Admitted several days ago with altered mental status/ metabolic acidosis and respiratory alkalosis with uncontrolled blood sugars.  She had elevated LFTs for which we saw her in consult on 7/29. During workup an MRI showed LUQ fluid collection next to stomach and also biliary duct dilation.   Elevated liver chemistries, ? 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  -Liver tests continue to improve but follow resolution of acute issues will need further evaluation of MRCP findings of mild intrahepatic bile duct dilatation with focal area ofnarrowing involving the proximal common bile duct just beyond the confluence of the bile ducts.  History of gastric sleeve in 2015. Currently has a large fluid collection next to stomach on CT scan . UGI series confirms leak / contained perforation of gastric fundus ,presumably due to suture dehiscence from prior sleeve gastrectomy  -General Surgery following. No surgical intervention indicated at present. At some point, once leak heals Surgery will consider conversion to a roux-en y bypass -NPO getting TNA -On Zosyn -LUQ drain placement by IR on 8/2. Had 95 ml drain output yesterday. -Will proceed with EGD tomorrow vr Wed with Dr. Meridee Score. Will discuss with him and Endoscopy about timing.    -------------------------------------------------------------------------------------------------    [facility] Attending   I have taken an interval history, reviewed the chart and examined the patient. I agree with the Advanced Practitioner's note, impression and recommendations with these additions:  Hoping for Dr. Meridee Score to do EGD and evaluate and possibly  treat gastric perforation (? Stent).  Right now 1PM 8/7  LFT abnormalities likely multifactorial. On the back burner.  Will; keep patient and team posted   [name], MD, [facility] See [name] on call - gastroenterology for best contact person 06/19/2023 2:27 PM   Subjective   Discomfort at perc drain site. No other complaints   Objective   Recent Labs    06/17/23 0454 06/19/23 0856  WBC 4.6 4.8  HGB 8.4* 8.4*  HCT 28.2* 29.6*  PLT 190 179   BMET Recent Labs    06/17/23 0454 06/18/23 0338 06/19/23 0856  NA 133* 136 136  K 3.5 4.1 4.2  CL 102 107 106  CO2 26 22 22   GLUCOSE 296* 204* 174*  BUN 10 11 13   CREATININE 0.78 0.68 0.56  CALCIUM 8.2* 8.7* 8.4*   LFT Recent Labs    06/19/23 0856  PROT 6.4*  ALBUMIN 1.9*  AST 35  ALT 73*  ALKPHOS 147*  BILITOT 1.7*   PT/INR No results for input(s): "LABPROT", "INR" in the last 72 hours.   Imaging:  CT GUIDED PERITONEAL/RETROPERITONEAL FLUID DRAIN BY PERC CATH INDICATION: Contained perforation/leak of gastric sleeve with left upper quadrant abscess.  EXAM: CT-guided drain placement  MEDICATIONS: The patient is currently admitted to the hospital and receiving intravenous antibiotics. The antibiotics were administered within an appropriate time frame prior to the initiation of the procedure.  ANESTHESIA/SEDATION: Moderate (conscious) sedation was employed during this procedure. A total of Versed 2 mg and Fentanyl 100 mcg was administered intravenously by the radiology nurse.  Total intra-service moderate Sedation Time: 17 minutes. The patient's level of consciousness and vital signs  were monitored continuously by radiology nursing throughout the procedure under my direct supervision.  COMPLICATIONS: None immediate.  PROCEDURE: Informed written consent was obtained from the patient after a thorough discussion of the procedural risks, benefits and alternatives. All  questions were addressed. Maximal Sterile Barrier Technique was utilized including caps, mask, sterile gowns, sterile gloves, sterile drape, hand hygiene and skin antiseptic. A timeout was performed prior to the initiation of the procedure.  CT imaging was performed. The fluid and gas collection in the left upper quadrant was identified. A suitable skin entry site was selected and marked. Local anesthesia was attained by infiltration with 1% lidocaine. A small dermatotomy was made.  Under intermittent CT guidance, an 18 gauge trocar needle was carefully advanced into the collection. A 0.035 wire was coiled in the collection. The percutaneous tract was dilated to 12 [place]. A 12 French all-purpose drainage catheter was advanced over the wire and formed. Aspiration yields approximately 250 mL of purulent fluid. A sample was sent for Gram stain and culture.  The drain was flushed and connected to JP bulb suction before being secured to the skin with 0 Prolene suture. Follow-up CT imaging demonstrates a well-positioned drainage catheter and no evidence of complication.  IMPRESSION: Successful placement of 12 French drainage catheter into the left upper quadrant abscess.  Electronically Signed   By: Malachy Moan M.D.   On: 06/16/2023 15:32     Scheduled inpatient medications:   Chlorhexidine Gluconate Cloth  6 each Topical Daily   enoxaparin (LOVENOX) injection  40 mg Subcutaneous Q24H   insulin aspart  0-20 Units Subcutaneous Q4H   pantoprazole (PROTONIX) IV  40 mg Intravenous Q12H   sodium chloride flush  10-40 mL Intracatheter Q12H   sodium chloride flush  5 mL Intracatheter Q8H   Continuous inpatient infusions:   sodium chloride 20 mL/hr at 06/19/23 0823   piperacillin-tazobactam (ZOSYN)  IV 3.375 g (06/19/23 0523)   TPN ADULT (ION) 85 mL/hr at 06/18/23 1736   TPN ADULT (ION)     PRN inpatient medications: albuterol, morphine injection, [DISCONTINUED] ondansetron  **OR** ondansetron (ZOFRAN) IV, mouth rinse, sodium chloride flush  Vital signs in last 24 hours: Temp:  [97.7 F (36.5 C)-98.5 F (36.9 C)] 97.9 F (36.6 C) (08/05 0936) Pulse Rate:  [94-98] 95 (08/05 0936) Resp:  [18] 18 (08/05 0936) BP: (112-140)/(63-91) 112/65 (08/05 0936) SpO2:  [100 %] 100 % (08/05 0936) Last BM Date : 06/17/23  Intake/Output Summary (Last 24 hours) at 06/19/2023 1209 Last data filed at 06/19/2023 0900 Gross per 24 hour  Intake 1193.37 ml  Output 96 ml  Net 1097.37 ml    Intake/Output from previous day: 08/04 0701 - 08/05 0700 In: 1408.5 [I.V.:1235.8; IV Piggyback:157.7] Out: 96 [Drains:95] Intake/Output this shift: No intake/output data recorded.   Physical Exam:  General: Alert female in NAD Heart:  Regular rate and rhythm.  Pulmonary: Normal respiratory effort Abdomen: Soft, nondistended, nontender. Normal bowel sounds. Extremities: No lower extremity edema  Neurologic: Alert and oriented Psych: Pleasant. Cooperative. Insight appears normal.    Principal Problem:   AMS (altered mental status) Active Problems:   Asthma   DKA (diabetic ketoacidosis) (HCC)   Acute metabolic encephalopathy   Nausea & vomiting   Transaminitis   Gastric perforation (HCC)     LOS: 11 days   Willette Cluster ,NP 06/19/2023, 12:09 PM

## 2023-06-19 NOTE — Progress Notes (Addendum)
Frances Rodgers  IHK:[id number] DOB: 05/19/1971 DOA: 06/07/2023 PCP: Tollie Eth, NP    Brief Narrative:  52 year old with a history of mild intermittent asthma and recently diagnosed DM who was brought to the ER by a family member with altered mental status.  Workup revealed elevated LFTs.  Significant Events: 8/2 percutaneous IR drain and L UQ collection yielding several 100 mL of purulent material 8/2 PICC line placed  Goals of Care:   Code Status: Full Code   DVT prophylaxis: enoxaparin (LOVENOX) injection 40 mg Start: 06/07/23 2000  Interim Hx: No acute events recorded overnight.  Afebrile.  Vital signs stable.  The patient is frustrated with the need for her hospitalization to continue.  I had a lengthy discussion with her and she understands the reasoning, but was expecting EGD to be accomplished today and is unhappy that this has not proven to be the case.  Assessment & Plan:  9.2 cm collection of gas and fluid left upper quadrant Incidentally noted on MRI abdomen -follow-up CT confirmed this finding with appearance worrisome for possible contained perforation given proximity to previous suture line from gastric bypass procedure - UGI 8/1 c/w a leak and contained perforation of the gastric fundus - General Surgery coordinating care with the GI team - continue NPO status with TNA support - s/p IR drainage of the fluid collection -for EGD to investigate internal anatomy and timing to be determined by GI  Acute metabolic encephalopathy of unclear etiology Possibly related to profound acid-base abnormalities/acute metabolic acidosis/respiratory alkalosis - UDS negative -UA without evidence of UTI -CXR with no clear infiltrate -thyroid function recently confirmed to be within normal limits - resolved  Acute transaminitis Unclear etiology -US revealed no obstructive gallstones and no suggestion of acute cholecystitis -CT abdomen 7/25 without acute findings -viral hepatitis panel  negative -possible shock liver versus drug-induced liver injury -AMA and ASMA negative -CMV IgM negative -MRCP 7/21 revealed a 9.2 x 7.0 x 7.2 cm collection of gas and fluid in the left upper quadrant lateral to the stomach of indeterminate etiology as well as mild intrahepatic bile duct dilatation with a focal area of narrowing involving the proximal common bile duct possibly representing a stricture -LFTs continue to improve and are now approaching normal  Recent Labs  Lab 06/13/23 0020 06/14/23 0405 06/15/23 0237 06/17/23 0454 06/19/23 0856  AST 244* 93* 55* 31 35  ALT 635* 422* 237* 113* 73*  ALKPHOS 289* 303* 263* 194* 147*  BILITOT 4.6* 4.9* 3.7* 2.8* 1.7*  PROT 5.9* 6.6 5.8* 5.8* 6.4*  ALBUMIN 2.3* 2.4* 2.0* 1.8* 1.9*    Acute kidney injury Felt to be due to poor intake/dehydration -now resolved with normal creatinine/GFR  DM2 with starvation ketoacidosis CBG well-controlled with patient presently n.p.o.  Peripheral edema Venous duplex negative for DVT  Chronic mild intermittent asthma Stable at present  Obesity - Body mass index is 37.16 kg/m.  Status post lap band and subsequent removal   Family Communication: No family present at time of exam Disposition: Discharge home will be quite delayed due to need for TNA and strict n.p.o. status   Objective: Blood pressure 112/65, pulse 95, temperature 97.9 F (36.6 C), temperature source Oral, resp. rate 18, height 5\' 5"  (1.651 m), weight 101.3 kg, last menstrual period 06/08/2023, SpO2 100%.  Intake/Output Summary (Last 24 hours) at 06/19/2023 1013 Last data filed at 06/19/2023 0900 Gross per 24 hour  Intake 1193.37 ml  Output 97 ml  Net 1096.37 ml  Filed Weights   06/16/23 0433 06/17/23 0500  Weight: 101 kg 101.3 kg    Examination: General: No acute respiratory distress Lungs: Clear to auscultation bilaterally -no wheeze Cardiovascular: Regular rate and rhythm without murmur  Abdomen: Soft, bowel sounds  positive, drain in place, no rebound Extremities: No significant edema B LE   CBC: Recent Labs  Lab 06/15/23 0237 06/17/23 0454 06/19/23 0856  WBC 6.4 4.6 4.8  HGB 8.9* 8.4* 8.4*  HCT 29.3* 28.2* 29.6*  MCV 78.6* 84.9 86.0  PLT 251 190 179   Basic Metabolic Panel: Recent Labs  Lab 06/17/23 0454 06/18/23 0338 06/19/23 0856  NA 133* 136 136  K 3.5 4.1 4.2  CL 102 107 106  CO2 26 22 22   GLUCOSE 296* 204* 174*  BUN 10 11 13   CREATININE 0.78 0.68 0.56  CALCIUM 8.2* 8.7* 8.4*  MG 1.5* 1.8 1.8  PHOS 2.8 2.3* 2.9   GFR: Estimated Creatinine Clearance: 99.2 mL/min (by C-G formula based on SCr of 0.56 mg/dL).   Scheduled Meds:  Chlorhexidine Gluconate Cloth  6 each Topical Daily   enoxaparin (LOVENOX) injection  40 mg Subcutaneous Q24H   insulin aspart  0-15 Units Subcutaneous Q6H   pantoprazole (PROTONIX) IV  40 mg Intravenous Q12H   sodium chloride flush  10-40 mL Intracatheter Q12H   sodium chloride flush  5 mL Intracatheter Q8H      LOS: 11 days   Lonia Blood, MD [facility] Office  [phone] Pager - Text Page per [name]  If 7PM-7AM, please contact night-coverage per Amion 06/19/2023, 10:13 AM

## 2023-06-19 NOTE — Progress Notes (Addendum)
PHARMACY - TOTAL PARENTERAL NUTRITION CONSULT NOTE   Indication:  gastric leak  Patient Measurements: Height: 5\' 5"  (165.1 cm) Weight: 101.3 kg (223 lb 5.2 oz) IBW/kg (Calculated) : 57   Body mass index is 37.16 kg/m. Adj Body Weight: 75 kg Usual Weight: ~97.3 kg  Assessment:  52 yo F with PMH of T2DM, asthma, obesity s/p gastric sleeve in 2015, and GERD. Pt presented on 7/24 with AMS and was found to have metabolic acidosis and respiratory alkalosis with uncontrolled BG. LFTs were elevated on admission and GI was consulted. LFTs initially improved, then peaked on 06/11/23. On 8/1, UGI showed a gastric leak likely due to suture dehiscence of previous gastric sleeve performed in 2015. Surgery was consulted on 8/1 and patient was made NPO. A 10mL fluid collection was also noted adjacent to the stable line and the gastric perforation. IR has been consulted for drain placement. Pharmacy was consulted for TPN initiation on 8/2 due to gastric leak to provide nutrition.  Per patient, usual weight is 225lbs and has not lost weight recently to her knowledge. She was eating about 50% of meals last week, mainly because she did not like the food here. Normally she eats 3-4 meals a day at home including fruits, vegetables, hamburgers, [place] etc/   Glucose / Insulin: T2DM, A1c 11.7% (05/27/23), BG 174-205 since new TPN hung with 65u insulin, CBG in 24 h 223-172, 29u/24h SSI  -PTA used insulin glargine 22 units at bedtime and insulin lispro 6 units TID Electrolytes: K 4.2, CoCa 10.1 (reduced 8/4), Phos 2.9 (s/p NaPhos, increased 8/4), Mg 1.8 (increased 8/4), HCO3: 22, Cl: 106  others WNL  Renal: SCr 0.56, BUN WNL Hepatic: alk phos/ ALT/Tbili trending down,  AST wnl, albumin 1.9 Intake / Output; MIVF: UOP x15 documented, drains 95ml, LBM 8/3  GI Imaging: 7/24 KUB: cholelithiasis, hepatic steatosis  7/25 CT: no acute findings  7/30 MRI: LUQ gas and fluid collection 7/31 CT: large LUQ gas and fluid  collection concerning for contained perforation/leak or abscess  8/1 UGI: gastric leak likely due to suture dehiscence from prior sleeve gastrectomy GI Surgeries / Procedures: 8/2 Drain to LUQ abscess  8/5 EGD   Central access: PICC placed 06/16/23 TPN start date: 06/16/23  Nutritional Goals: Goal TPN rate is 85 mL/hr (provides 114 g of protein and 2149 kcals per day)  RD Assessment: Estimated Needs Total Energy Estimated Needs: 2100-2300 Total Protein Estimated Needs: 110-130 grams Total Fluid Estimated Needs: >2.0 L  Current Nutrition:  NPO + TPN   Plan:  Continue TPN at goal 85 ml/hr, meeting 100% estimated needs, providing 2149kcal and 114g AA.  Electrolytes in TPN: Na 125 mEq/L, K 40 mEq/L (=7mEq, small reduction, rapid rate in K rise, note did receive 3 runs on 8/3), Ca 2 mEq/L, Mg 12 mEq/L, Phos 18 mmol/L. Cl:Ac 2:1, current formula unable to adjust chloride/acetate ratio.  Will trend, consider changing 1:2 on 8/6  Will trend electrolytes for full 24 hours on TPN prior to further adjustments.  Add standard MVI and trace elements to TPN Increase to Resistant q4h SSI.   Increase to 70 units of insulin regular in TPN Monitor TPN labs daily until stable at goal then on Mon/Thurs F/u EGD today, possible feeding tube placement   Estill Batten, PharmD, BCCCP  Clinical Pharmacist  Please check AMION for all [facility] phone numbers After 10:00 PM, call Main Pharmacy [phone]

## 2023-06-19 NOTE — Progress Notes (Signed)
[facility] Progress Note     Subjective: Still with some pain secondary to drain placement.  Frustrated with not knowing a time for her procedure for today.  Objective: Vital signs in last 24 hours: Temp:  [97.7 F (36.5 C)-98.5 F (36.9 C)] 98 F (36.7 C) (08/05 0744) Pulse Rate:  [94-98] 96 (08/05 0744) Resp:  [18] 18 (08/05 0744) BP: (118-140)/(63-91) 140/91 (08/05 0744) SpO2:  [100 %] 100 % (08/05 0744) Last BM Date : 06/17/23  Intake/Output from previous day: 08/04 0701 - 08/05 0700 In: 1408.5 [I.V.:1235.8; IV Piggyback:157.7] Out: 96 [Drains:95] Intake/Output this shift: No intake/output data recorded.  PE: Gen:  Alert Abd: Soft, tenderness on mostly left side of the abdomen around her drain site.  This is bloody purulent in nature. 95cc in last 24 hrs.  Ext:  PICC RUE getting worked on Psych: A&Ox4   Lab Results:  Recent Labs    06/17/23 0454  WBC 4.6  HGB 8.4*  HCT 28.2*  PLT 190   BMET Recent Labs    06/17/23 0454 06/18/23 0338  NA 133* 136  K 3.5 4.1  CL 102 107  CO2 26 22  GLUCOSE 296* 204*  BUN 10 11  CREATININE 0.78 0.68  CALCIUM 8.2* 8.7*   PT/INR Recent Labs    06/16/23 1136  LABPROT 17.3*  INR 1.4*   CMP     Component Value Date/Time   NA 136 06/18/2023 0338   NA 136 09/30/2022 1457   NA 137 12/24/2013 1044   K 4.1 06/18/2023 0338   K 3.8 12/24/2013 1044   CL 107 06/18/2023 0338   CL 103 12/24/2013 1044   CO2 22 06/18/2023 0338   CO2 26 12/24/2013 1044   GLUCOSE 204 (H) 06/18/2023 0338   GLUCOSE 116 (H) 12/24/2013 1044   BUN 11 06/18/2023 0338   BUN 12 09/30/2022 1457   BUN 8 12/24/2013 1044   CREATININE 0.68 06/18/2023 0338   CREATININE 1.06 12/24/2013 1044   CALCIUM 8.7 (L) 06/18/2023 0338   CALCIUM 9.0 12/24/2013 1044   PROT 5.8 (L) 06/17/2023 0454   PROT 7.0 09/30/2022 1457   PROT 7.4 12/24/2013 1044   ALBUMIN 1.8 (L) 06/17/2023 0454   ALBUMIN 3.9 09/30/2022 1457   ALBUMIN 3.4 12/24/2013 1044    AST 31 06/17/2023 0454   AST 17 12/24/2013 1044   ALT 113 (H) 06/17/2023 0454   ALT 25 12/24/2013 1044   ALKPHOS 194 (H) 06/17/2023 0454   ALKPHOS 76 12/24/2013 1044   BILITOT 2.8 (H) 06/17/2023 0454   BILITOT 0.3 09/30/2022 1457   BILITOT 0.5 12/24/2013 1044   GFRNONAA >60 06/18/2023 0338   GFRNONAA >60 12/24/2013 1044   GFRAA 92 07/08/2020 1614   GFRAA >60 12/24/2013 1044   Lipase     Component Value Date/Time   LIPASE 64 (H) 06/11/2023 0036   LIPASE 92 12/24/2013 1044       Studies/Results: No results found.  Anti-infectives: Anti-infectives (From admission, onward)    Start     Dose/Rate Route Frequency Ordered Stop   06/16/23 1730  piperacillin-tazobactam (ZOSYN) IVPB 3.375 g        3.375 g 12.5 mL/hr over 240 Minutes Intravenous Every 8 hours 06/16/23 1634          Assessment/Plan Hx of prior gastric sleeve at [facility] in 2015 10 cm fluid collection adjacent to staple line secondary to a contained gastric perforation - s/p IR drain into LUQ collection 8/2 >> several  hundred mL purulent material aspirated, culture pending - GI following with plans for EGD today based on their last note to assess the stomach and help determine etiology of her leak, suspect secondary to narrowing near her angularis.   - will determine further plans after this is completed. - continue strict NPO, TPN - Surgical intervention is not indicated right now as the ultimate treatment will be decompressing the narrowing with EGD, +/- stenting (if this is etiology) and then after the leak heals possible conversion to a roux-en-y bypass. - labs pending as picc is not working currently.  IV team working on this.   FEN: NPO/TNA VTE: LMWH ID: [email or web address]   - per TRH -  Asthma Recent dx of IDDM, DKA, resolved AKI, resolved Transaminitis, improving  I reviewed Consultant GI notes, hospitalist notes, last 24 h vitals and pain scores, last 48 h intake and output, last 24 h labs and trends, and last 24  h imaging results.    LOS: 11 days    Letha Cape, [facility] 06/19/2023, 9:15 AM Please see Amion for pager number during day hours 7:00am-4:30pm

## 2023-06-19 NOTE — Plan of Care (Signed)
Problem: Health Behavior/Discharge Planning: Goal: Ability to identify and utilize available resources and services will improve Outcome: Progressing Goal: Ability to manage health-related needs will improve Outcome: Progressing   Problem: Metabolic: Goal: Ability to maintain appropriate glucose levels will improve Outcome: Progressing   Problem: Nutritional: Goal: Maintenance of adequate nutrition will improve Outcome: Progressing Goal: Maintenance of adequate weight for body size and type will improve Outcome: Progressing   Problem: Respiratory: Goal: Will regain and/or maintain adequate ventilation Outcome: Progressing   Problem: Urinary Elimination: Goal: Ability to achieve and maintain adequate renal perfusion and functioning will improve Outcome: Progressing   Problem: Education: Goal: Ability to describe self-care measures that may prevent or decrease complications (Diabetes Survival Skills Education) will improve Outcome: Progressing Goal: Individualized Educational Video(s) Outcome: Progressing   Problem: Coping: Goal: Ability to adjust to condition or change in health will improve Outcome: Progressing   Problem: Fluid Volume: Goal: Ability to maintain a balanced intake and output will improve Outcome: Progressing   Problem: Health Behavior/Discharge Planning: Goal: Ability to identify and utilize available resources and services will improve Outcome: Progressing Goal: Ability to manage health-related needs will improve Outcome: Progressing   Problem: Metabolic: Goal: Ability to maintain appropriate glucose levels will improve Outcome: Progressing   Problem: Nutritional: Goal: Maintenance of adequate nutrition will improve Outcome: Progressing Goal: Progress toward achieving an optimal weight will improve Outcome: Progressing   Problem: Skin Integrity: Goal: Risk for impaired skin integrity will decrease Outcome: Progressing   Problem: Tissue  Perfusion: Goal: Adequacy of tissue perfusion will improve Outcome: Progressing   Problem: Education: Goal: Ability to describe self-care measures that may prevent or decrease complications (Diabetes Survival Skills Education) will improve Outcome: Progressing Goal: Individualized Educational Video(s) Outcome: Progressing   Problem: Coping: Goal: Ability to adjust to condition or change in health will improve Outcome: Progressing   Problem: Fluid Volume: Goal: Ability to maintain a balanced intake and output will improve Outcome: Progressing   Problem: Health Behavior/Discharge Planning: Goal: Ability to identify and utilize available resources and services will improve Outcome: Progressing Goal: Ability to manage health-related needs will improve Outcome: Progressing   Problem: Metabolic: Goal: Ability to maintain appropriate glucose levels will improve Outcome: Progressing   Problem: Nutritional: Goal: Maintenance of adequate nutrition will improve Outcome: Progressing Goal: Progress toward achieving an optimal weight will improve Outcome: Progressing   Problem: Skin Integrity: Goal: Risk for impaired skin integrity will decrease Outcome: Progressing   Problem: Tissue Perfusion: Goal: Adequacy of tissue perfusion will improve Outcome: Progressing   Problem: Education: Goal: Knowledge of General Education information will improve Description: Including pain rating scale, medication(s)/side effects and non-pharmacologic comfort measures Outcome: Progressing   Problem: Health Behavior/Discharge Planning: Goal: Ability to manage health-related needs will improve Outcome: Progressing   Problem: Clinical Measurements: Goal: Ability to maintain clinical measurements within normal limits will improve Outcome: Progressing Goal: Will remain free from infection Outcome: Progressing Goal: Diagnostic test results will improve Outcome: Progressing Goal: Respiratory  complications will improve Outcome: Progressing Goal: Cardiovascular complication will be avoided Outcome: Progressing   Problem: Activity: Goal: Risk for activity intolerance will decrease Outcome: Progressing   Problem: Nutrition: Goal: Adequate nutrition will be maintained Outcome: Progressing   Problem: Coping: Goal: Level of anxiety will decrease Outcome: Progressing   Problem: Elimination: Goal: Will not experience complications related to bowel motility Outcome: Progressing Goal: Will not experience complications related to urinary retention Outcome: Progressing   Problem: Pain Managment: Goal: General experience of comfort will improve Outcome: Progressing  Problem: Safety: Goal: Ability to remain free from injury will improve Outcome: Progressing   Problem: Skin Integrity: Goal: Risk for impaired skin integrity will decrease Outcome: Progressing

## 2023-06-20 DIAGNOSIS — K255 Chronic or unspecified gastric ulcer with perforation: Secondary | ICD-10-CM | POA: Diagnosis not present

## 2023-06-20 DIAGNOSIS — R7401 Elevation of levels of liver transaminase levels: Secondary | ICD-10-CM | POA: Diagnosis not present

## 2023-06-20 DIAGNOSIS — K3189 Other diseases of stomach and duodenum: Secondary | ICD-10-CM | POA: Diagnosis not present

## 2023-06-20 DIAGNOSIS — R7989 Other specified abnormal findings of blood chemistry: Secondary | ICD-10-CM | POA: Diagnosis not present

## 2023-06-20 DIAGNOSIS — R4182 Altered mental status, unspecified: Secondary | ICD-10-CM | POA: Diagnosis not present

## 2023-06-20 LAB — BASIC METABOLIC PANEL
Anion gap: 7 (ref 5–15)
BUN: 15 mg/dL (ref 6–20)
CO2: 21 mmol/L — ABNORMAL LOW (ref 22–32)
Calcium: 8.5 mg/dL — ABNORMAL LOW (ref 8.9–10.3)
Chloride: 107 mmol/L (ref 98–111)
Creatinine, Ser: 0.57 mg/dL (ref 0.44–1.00)
GFR, Estimated: >60 mL/min (ref >60)
Glucose, Bld: 221 mg/dL — ABNORMAL HIGH (ref 70–99)
Potassium: 4.2 mmol/L (ref 3.5–5.1)
Sodium: 135 mmol/L (ref 135–145)

## 2023-06-20 LAB — GLUCOSE, CAPILLARY
Glucose-Capillary: 160 mg/dL — ABNORMAL HIGH (ref 70–99)
Glucose-Capillary: 216 mg/dL — ABNORMAL HIGH (ref 70–99)
Glucose-Capillary: 168 mg/dL — ABNORMAL HIGH (ref 70–99)
Glucose-Capillary: 158 mg/dL — ABNORMAL HIGH (ref 70–99)
Glucose-Capillary: 162 mg/dL — ABNORMAL HIGH (ref 70–99)

## 2023-06-20 LAB — PHOSPHORUS: Phosphorus: 3.1 mg/dL (ref 2.5–4.6)

## 2023-06-20 LAB — MAGNESIUM: Magnesium: 1.9 mg/dL (ref 1.7–2.4)

## 2023-06-20 MED ORDER — MORPHINE SULFATE (PF) 2 MG/ML IV SOLN
2.0000 mg | Freq: Four times a day (QID) | INTRAVENOUS | Status: DC
  Administered 2023-06-20 – 2023-06-24 (×10): 2 mg via INTRAVENOUS
  Filled 2023-06-20 (×10): qty 1

## 2023-06-20 MED ORDER — METHOCARBAMOL 1000 MG/10ML IJ SOLN
500.0000 mg | Freq: Three times a day (TID) | INTRAVENOUS | Status: DC | PRN
  Administered 2023-06-20: 500 mg via INTRAVENOUS
  Filled 2023-06-20: qty 500

## 2023-06-20 MED ORDER — TRAVASOL 10 % IV SOLN
INTRAVENOUS | Status: AC
  Filled 2023-06-20: qty 1100

## 2023-06-20 MED ORDER — FLUCONAZOLE IN SODIUM CHLORIDE 200-0.9 MG/100ML-% IV SOLN
200.0000 mg | INTRAVENOUS | Status: DC
  Administered 2023-06-20 – 2023-06-25 (×6): 200 mg via INTRAVENOUS
  Filled 2023-06-20 (×7): qty 100

## 2023-06-20 MED ORDER — ACETAMINOPHEN 10 MG/ML IV SOLN
1000.0000 mg | Freq: Four times a day (QID) | INTRAVENOUS | Status: AC
  Administered 2023-06-20 – 2023-06-21 (×4): 1000 mg via INTRAVENOUS
  Filled 2023-06-20 (×4): qty 100

## 2023-06-20 NOTE — TOC Progression Note (Signed)
Transition of Care [facility]) - Progression Note    Patient Details  Name: Frances Rodgers MRN: [id number] Date of Birth: Jan 13, 1971  Transition of Care [facility]) CM/SW Contact  Tom-Johnson, Hershal Coria, RN Phone Number: 06/20/2023, 1:23 PM  Clinical Narrative:     Patient continues on IV abx, TPN via PICC. LUQ drain in place. GI following and plan for EGD  with Stent placement tomorrow 06/21/23.  Patient not Medically ready for discharge at this time.     CM will continue to follow as patient progresses with care towards discharge.       Expected Discharge Plan and Services                                               Social Determinants of Health (SDOH) Interventions SDOH Screenings   Food Insecurity: No Food Insecurity (06/16/2023)  Housing: Low Risk  (06/16/2023)  Transportation Needs: No Transportation Needs (06/16/2023)  Utilities: Not At Risk (06/16/2023)  Alcohol Screen: Low Risk  (05/03/2018)  Depression (PHQ2-9): Low Risk  (04/12/2023)  Tobacco Use: Low Risk  (06/15/2023)    Readmission Risk Interventions     No data to display

## 2023-06-20 NOTE — H&P (View-Only) (Signed)
Daily Progress Note  DOA: 06/07/2023 Hospital Day: 14 Chief Complaint: elevated liver tests and gastric perforation   ASSESSMENT & PLAN   Brief Narrative:  Frances Rodgers is a 52 y.o. year old female with a history of  DM, asthma, obesity s/p gastric sleeve in 2015.  Admitted several days ago with altered mental status/ metabolic acidosis and respiratory alkalosis with uncontrolled blood sugars.  She had elevated LFTs for which we saw her in consult on 7/29. During workup an MRI showed LUQ fluid collection next to stomach and also biliary duct dilation.    History of gastric sleeve in 2015. Currently has a large fluid collection next to stomach on CT scan . UGI series confirms leak / contained perforation of gastric fundus ,presumably due to suture dehiscence from prior sleeve gastrectomy  -General Surgery following. No surgical intervention indicated at present. At some point, once leak heals Surgery may consider conversion to a roux-en y bypass -NPO getting TNA -On Zosyn -LUQ drain placement by IR on 8/2. Scant amount of purulent drainage in bulb. -Plan is for EGD with ? stent placement tomorrow with Dr. Meridee Score.   Elevated liver chemistries, ? 2/2 to ischemic colitis vrs DILI, vrs. CBD stricture or possible infection.  -Liver chemistries continue to improve. Following resolution of acute issues will further evaluate MRCP findings of mild intrahepatic bile duct dilatation with focal area of narrowing of the proximal common bile duct just beyond the confluence of the bile ducts.   ----------------------------------------------------------------------------------------------------     [place] [facility] Attending   I have taken an interval history, reviewed the chart and examined the patient. I agree with the Advanced Practitioner's note, impression and recommendations.  EGD 1500 tomorrow  [name], MD, [facility] See [name] on call - gastroenterology for  best contact person 06/20/2023 1:50 PM   Subjective   Feels okay overall. Still with discomfort at drain site but otherwise no significant abdominal pain .   Objective    Recent Labs    06/19/23 0856  WBC 4.8  HGB 8.4*  HCT 29.6*  PLT 179   BMET Recent Labs    06/18/23 0338 06/19/23 0856 06/20/23 0835  NA 136 136 135  K 4.1 4.2 4.2  CL 107 106 107  CO2 22 22 21*  GLUCOSE 204* 174* 221*  BUN 11 13 15   CREATININE 0.68 0.56 0.57  CALCIUM 8.7* 8.4* 8.5*   LFT Recent Labs    06/19/23 0856  PROT 6.4*  ALBUMIN 1.9*  AST 35  ALT 73*  ALKPHOS 147*  BILITOT 1.7*   PT/INR No results for input(s): "LABPROT", "INR" in the last 72 hours.   Imaging:  CT GUIDED PERITONEAL/RETROPERITONEAL FLUID DRAIN BY PERC CATH INDICATION: Contained perforation/leak of gastric sleeve with left upper quadrant abscess.  EXAM: CT-guided drain placement  MEDICATIONS: The patient is currently admitted to the hospital and receiving intravenous antibiotics. The antibiotics were administered within an appropriate time frame prior to the initiation of the procedure.  ANESTHESIA/SEDATION: Moderate (conscious) sedation was employed during this procedure. A total of Versed 2 mg and Fentanyl 100 mcg was administered intravenously by the radiology nurse.  Total intra-service moderate Sedation Time: 17 minutes. The patient's level of consciousness and vital signs were monitored continuously by radiology nursing throughout the procedure under my direct supervision.  COMPLICATIONS: None immediate.  PROCEDURE: Informed written consent was obtained from the patient after a thorough discussion of the procedural risks, benefits and alternatives. All  questions were addressed. Maximal Sterile Barrier Technique was utilized including caps, mask, sterile gowns, sterile gloves, sterile drape, hand hygiene and skin antiseptic. A timeout was performed prior to the initiation of the  procedure.  CT imaging was performed. The fluid and gas collection in the left upper quadrant was identified. A suitable skin entry site was selected and marked. Local anesthesia was attained by infiltration with 1% lidocaine. A small dermatotomy was made.  Under intermittent CT guidance, an 18 gauge trocar needle was carefully advanced into the collection. A 0.035 wire was coiled in the collection. The percutaneous tract was dilated to 12 [place]. A 12 French all-purpose drainage catheter was advanced over the wire and formed. Aspiration yields approximately 250 mL of purulent fluid. A sample was sent for Gram stain and culture.  The drain was flushed and connected to JP bulb suction before being secured to the skin with 0 Prolene suture. Follow-up CT imaging demonstrates a well-positioned drainage catheter and no evidence of complication.  IMPRESSION: Successful placement of 12 French drainage catheter into the left upper quadrant abscess.  Electronically Signed   By: Malachy Moan M.D.   On: 06/16/2023 15:32     Scheduled inpatient medications:   Chlorhexidine Gluconate Cloth  6 each Topical Daily   enoxaparin (LOVENOX) injection  40 mg Subcutaneous Q24H   insulin aspart  0-20 Units Subcutaneous Q4H   pantoprazole (PROTONIX) IV  40 mg Intravenous Q12H   sodium chloride flush  10-40 mL Intracatheter Q12H   sodium chloride flush  5 mL Intracatheter Q8H   Continuous inpatient infusions:   sodium chloride 20 mL/hr at 06/19/23 9147   acetaminophen     fluconazole (DIFLUCAN) IV     methocarbamol (ROBAXIN) IV     piperacillin-tazobactam (ZOSYN)  IV 3.375 g (06/20/23 0630)   TPN ADULT (ION) 85 mL/hr at 06/19/23 1835   TPN ADULT (ION)     PRN inpatient medications: albuterol, methocarbamol (ROBAXIN) IV, morphine injection, [DISCONTINUED] ondansetron **OR** ondansetron (ZOFRAN) IV, mouth rinse, sodium chloride flush  Vital signs in last 24 hours: Temp:  [97.8 F (36.6  C)-98.2 F (36.8 C)] 98.2 F (36.8 C) (08/06 0836) Pulse Rate:  [87-99] 96 (08/06 0836) Resp:  [18-19] 18 (08/06 0836) BP: (107-133)/(62-76) 112/62 (08/06 0836) SpO2:  [100 %] 100 % (08/06 0836) Weight:  [101.5 kg] 101.5 kg (08/06 0350) Last BM Date : 06/17/23  Intake/Output Summary (Last 24 hours) at 06/20/2023 1050 Last data filed at 06/20/2023 0947 Gross per 24 hour  Intake 963.94 ml  Output 115 ml  Net 848.94 ml    Intake/Output from previous day: 08/05 0701 - 08/06 0700 In: 953.9 [I.V.:856.6; IV Piggyback:92.3] Out: 115 [Drains:115] Intake/Output this shift: Total I/O In: 10 [I.V.:10] Out: -    Physical Exam:  General: Alert female in NAD Heart:  Regular rate and rhythm.  Pulmonary: Normal respiratory effort Abdomen: Soft, nondistended. Normal bowel sounds. Neurologic: Alert and oriented Psych: Pleasant. Cooperative. Insight appears normal.    Principal Problem:   AMS (altered mental status) Active Problems:   Asthma   DKA (diabetic ketoacidosis) (HCC)   Acute metabolic encephalopathy   Nausea & vomiting   Transaminitis   Gastric perforation (HCC)     LOS: 12 days   Willette Cluster ,NP 06/20/2023, 10:50 AM

## 2023-06-20 NOTE — Progress Notes (Addendum)
Daily Progress Note  DOA: 06/07/2023 Hospital Day: 14 Chief Complaint: elevated liver tests and gastric perforation   ASSESSMENT & PLAN   Brief Narrative:  Frances Rodgers is a 52 y.o. year old female with a history of  DM, asthma, obesity s/p gastric sleeve in 2015.  Admitted several days ago with altered mental status/ metabolic acidosis and respiratory alkalosis with uncontrolled blood sugars.  She had elevated LFTs for which we saw her in consult on 7/29. During workup an MRI showed LUQ fluid collection next to stomach and also biliary duct dilation.    History of gastric sleeve in 2015. Currently has a large fluid collection next to stomach on CT scan . UGI series confirms leak / contained perforation of gastric fundus ,presumably due to suture dehiscence from prior sleeve gastrectomy  -General Surgery following. No surgical intervention indicated at present. At some point, once leak heals Surgery may consider conversion to a roux-en y bypass -NPO getting TNA -On Zosyn -LUQ drain placement by IR on 8/2. Scant amount of purulent drainage in bulb. -Plan is for EGD with ? stent placement tomorrow with Dr. Meridee Score.   Elevated liver chemistries, ? 2/2 to ischemic colitis vrs DILI, vrs. CBD stricture or possible infection.  -Liver chemistries continue to improve. Following resolution of acute issues will further evaluate MRCP findings of mild intrahepatic bile duct dilatation with focal area of narrowing of the proximal common bile duct just beyond the confluence of the bile ducts.   ----------------------------------------------------------------------------------------------------     [place] [facility] Attending   I have taken an interval history, reviewed the chart and examined the patient. I agree with the Advanced Practitioner's note, impression and recommendations.  EGD 1500 tomorrow  [name], MD, [facility] See [name] on call - gastroenterology for  best contact person 06/20/2023 1:50 PM   Subjective   Feels okay overall. Still with discomfort at drain site but otherwise no significant abdominal pain .   Objective    Recent Labs    06/19/23 0856  WBC 4.8  HGB 8.4*  HCT 29.6*  PLT 179   BMET Recent Labs    06/18/23 0338 06/19/23 0856 06/20/23 0835  NA 136 136 135  K 4.1 4.2 4.2  CL 107 106 107  CO2 22 22 21*  GLUCOSE 204* 174* 221*  BUN 11 13 15   CREATININE 0.68 0.56 0.57  CALCIUM 8.7* 8.4* 8.5*   LFT Recent Labs    06/19/23 0856  PROT 6.4*  ALBUMIN 1.9*  AST 35  ALT 73*  ALKPHOS 147*  BILITOT 1.7*   PT/INR No results for input(s): "LABPROT", "INR" in the last 72 hours.   Imaging:  CT GUIDED PERITONEAL/RETROPERITONEAL FLUID DRAIN BY PERC CATH INDICATION: Contained perforation/leak of gastric sleeve with left upper quadrant abscess.  EXAM: CT-guided drain placement  MEDICATIONS: The patient is currently admitted to the hospital and receiving intravenous antibiotics. The antibiotics were administered within an appropriate time frame prior to the initiation of the procedure.  ANESTHESIA/SEDATION: Moderate (conscious) sedation was employed during this procedure. A total of Versed 2 mg and Fentanyl 100 mcg was administered intravenously by the radiology nurse.  Total intra-service moderate Sedation Time: 17 minutes. The patient's level of consciousness and vital signs were monitored continuously by radiology nursing throughout the procedure under my direct supervision.  COMPLICATIONS: None immediate.  PROCEDURE: Informed written consent was obtained from the patient after a thorough discussion of the procedural risks, benefits and alternatives. All  questions were addressed. Maximal Sterile Barrier Technique was utilized including caps, mask, sterile gowns, sterile gloves, sterile drape, hand hygiene and skin antiseptic. A timeout was performed prior to the initiation of the  procedure.  CT imaging was performed. The fluid and gas collection in the left upper quadrant was identified. A suitable skin entry site was selected and marked. Local anesthesia was attained by infiltration with 1% lidocaine. A small dermatotomy was made.  Under intermittent CT guidance, an 18 gauge trocar needle was carefully advanced into the collection. A 0.035 wire was coiled in the collection. The percutaneous tract was dilated to 12 [place]. A 12 French all-purpose drainage catheter was advanced over the wire and formed. Aspiration yields approximately 250 mL of purulent fluid. A sample was sent for Gram stain and culture.  The drain was flushed and connected to JP bulb suction before being secured to the skin with 0 Prolene suture. Follow-up CT imaging demonstrates a well-positioned drainage catheter and no evidence of complication.  IMPRESSION: Successful placement of 12 French drainage catheter into the left upper quadrant abscess.  Electronically Signed   By: Malachy Moan M.D.   On: 06/16/2023 15:32     Scheduled inpatient medications:   Chlorhexidine Gluconate Cloth  6 each Topical Daily   enoxaparin (LOVENOX) injection  40 mg Subcutaneous Q24H   insulin aspart  0-20 Units Subcutaneous Q4H   pantoprazole (PROTONIX) IV  40 mg Intravenous Q12H   sodium chloride flush  10-40 mL Intracatheter Q12H   sodium chloride flush  5 mL Intracatheter Q8H   Continuous inpatient infusions:   sodium chloride 20 mL/hr at 06/19/23 9147   acetaminophen     fluconazole (DIFLUCAN) IV     methocarbamol (ROBAXIN) IV     piperacillin-tazobactam (ZOSYN)  IV 3.375 g (06/20/23 0630)   TPN ADULT (ION) 85 mL/hr at 06/19/23 1835   TPN ADULT (ION)     PRN inpatient medications: albuterol, methocarbamol (ROBAXIN) IV, morphine injection, [DISCONTINUED] ondansetron **OR** ondansetron (ZOFRAN) IV, mouth rinse, sodium chloride flush  Vital signs in last 24 hours: Temp:  [97.8 F (36.6  C)-98.2 F (36.8 C)] 98.2 F (36.8 C) (08/06 0836) Pulse Rate:  [87-99] 96 (08/06 0836) Resp:  [18-19] 18 (08/06 0836) BP: (107-133)/(62-76) 112/62 (08/06 0836) SpO2:  [100 %] 100 % (08/06 0836) Weight:  [101.5 kg] 101.5 kg (08/06 0350) Last BM Date : 06/17/23  Intake/Output Summary (Last 24 hours) at 06/20/2023 1050 Last data filed at 06/20/2023 0947 Gross per 24 hour  Intake 963.94 ml  Output 115 ml  Net 848.94 ml    Intake/Output from previous day: 08/05 0701 - 08/06 0700 In: 953.9 [I.V.:856.6; IV Piggyback:92.3] Out: 115 [Drains:115] Intake/Output this shift: Total I/O In: 10 [I.V.:10] Out: -    Physical Exam:  General: Alert female in NAD Heart:  Regular rate and rhythm.  Pulmonary: Normal respiratory effort Abdomen: Soft, nondistended. Normal bowel sounds. Neurologic: Alert and oriented Psych: Pleasant. Cooperative. Insight appears normal.    Principal Problem:   AMS (altered mental status) Active Problems:   Asthma   DKA (diabetic ketoacidosis) (HCC)   Acute metabolic encephalopathy   Nausea & vomiting   Transaminitis   Gastric perforation (HCC)     LOS: 12 days   Willette Cluster ,NP 06/20/2023, 10:50 AM

## 2023-06-20 NOTE — Progress Notes (Signed)
[facility] Progress Note     Subjective: Having pain still.  Otherwise no new complaints.  Aware that EGD procedure is scheduled for tomorrow.  Objective: Vital signs in last 24 hours: Temp:  [97.8 F (36.6 C)-98.2 F (36.8 C)] 98.2 F (36.8 C) (08/06 0836) Pulse Rate:  [87-99] 96 (08/06 0836) Resp:  [18-19] 18 (08/06 0836) BP: (107-133)/(62-76) 112/62 (08/06 0836) SpO2:  [100 %] 100 % (08/06 0836) Weight:  [101.5 kg] 101.5 kg (08/06 0350) Last BM Date : 06/17/23  Intake/Output from previous day: 08/05 0701 - 08/06 0700 In: 953.9 [I.V.:856.6; IV Piggyback:92.3] Out: 115 [Drains:115] Intake/Output this shift: No intake/output data recorded.  PE: Gen:  Alert Abd: Soft, tenderness on mostly left side of the abdomen around her drain site.  This is more serosang today. 115cc/24hr Psych: A&Ox4   Lab Results:  Recent Labs    06/19/23 0856  WBC 4.8  HGB 8.4*  HCT 29.6*  PLT 179   BMET Recent Labs    06/18/23 0338 06/19/23 0856  NA 136 136  K 4.1 4.2  CL 107 106  CO2 22 22  GLUCOSE 204* 174*  BUN 11 13  CREATININE 0.68 0.56  CALCIUM 8.7* 8.4*   PT/INR No results for input(s): "LABPROT", "INR" in the last 72 hours.  CMP     Component Value Date/Time   NA 136 06/19/2023 0856   NA 136 09/30/2022 1457   NA 137 12/24/2013 1044   K 4.2 06/19/2023 0856   K 3.8 12/24/2013 1044   CL 106 06/19/2023 0856   CL 103 12/24/2013 1044   CO2 22 06/19/2023 0856   CO2 26 12/24/2013 1044   GLUCOSE 174 (H) 06/19/2023 0856   GLUCOSE 116 (H) 12/24/2013 1044   BUN 13 06/19/2023 0856   BUN 12 09/30/2022 1457   BUN 8 12/24/2013 1044   CREATININE 0.56 06/19/2023 0856   CREATININE 1.06 12/24/2013 1044   CALCIUM 8.4 (L) 06/19/2023 0856   CALCIUM 9.0 12/24/2013 1044   PROT 6.4 (L) 06/19/2023 0856   PROT 7.0 09/30/2022 1457   PROT 7.4 12/24/2013 1044   ALBUMIN 1.9 (L) 06/19/2023 0856   ALBUMIN 3.9 09/30/2022 1457   ALBUMIN 3.4 12/24/2013 1044   AST 35  06/19/2023 0856   AST 17 12/24/2013 1044   ALT 73 (H) 06/19/2023 0856   ALT 25 12/24/2013 1044   ALKPHOS 147 (H) 06/19/2023 0856   ALKPHOS 76 12/24/2013 1044   BILITOT 1.7 (H) 06/19/2023 0856   BILITOT 0.3 09/30/2022 1457   BILITOT 0.5 12/24/2013 1044   GFRNONAA >60 06/19/2023 0856   GFRNONAA >60 12/24/2013 1044   GFRAA 92 07/08/2020 1614   GFRAA >60 12/24/2013 1044   Lipase     Component Value Date/Time   LIPASE 64 (H) 06/11/2023 0036   LIPASE 92 12/24/2013 1044       Studies/Results: No results found.  Anti-infectives: Anti-infectives (From admission, onward)    Start     Dose/Rate Route Frequency Ordered Stop   06/16/23 1730  piperacillin-tazobactam (ZOSYN) IVPB 3.375 g        3.375 g 12.5 mL/hr over 240 Minutes Intravenous Every 8 hours 06/16/23 1634          Assessment/Plan Hx of prior gastric sleeve at [facility] in 2015 10 cm fluid collection adjacent to staple line secondary to a contained gastric perforation - s/p IR drain into LUQ collection 8/2 >> several hundred mL purulent material aspirated, culture ABUNDANT KLEBSIELLA PNEUMONIAE  ABUNDANT  LACTOBACILLUS SPECIES  - GI following with plans for EGD tomorrow to assess the stomach and help determine etiology of her leak, suspect secondary to narrowing near her angularis.   - will determine further plans after this is completed. - continue strict NPO, TPN -add some IV tylenol/robaxin as non-narcotics to help with control - Surgical intervention is not indicated right now as the ultimate treatment will be decompressing the narrowing with EGD, +/- stenting (if this is etiology) and then after the leak heals possible conversion to a roux-en-y bypass. -cont zosyn, will need to add fungal coverage given yeast noted in her culture.  Not unexpected in a foregut perforation. -discussed patient with primary service.    FEN: NPO/TNA VTE: LMWH ID: zosyn/likely diflucan    - per [facility] -  Asthma Recent dx of IDDM, DKA,  resolved AKI, resolved Transaminitis, improving  I reviewed Consultant GI notes, hospitalist notes, last 24 h vitals and pain scores, last 48 h intake and output, last 24 h labs and trends, and last 24 h imaging results.    LOS: 12 days    Letha Cape, [facility] 06/20/2023, 9:00 AM Please see Amion for pager number during day hours 7:00am-4:30pm

## 2023-06-20 NOTE — Progress Notes (Signed)
PHARMACY - TOTAL PARENTERAL NUTRITION CONSULT NOTE   Indication:  gastric leak  Patient Measurements: Height: 5\' 5"  (165.1 cm) Weight: 101.5 kg (223 lb 12.3 oz) IBW/kg (Calculated) : 57   Body mass index is 37.24 kg/m. Adj Body Weight: 75 kg Usual Weight: ~97.3 kg  Assessment:  52 yo F with PMH of T2DM, asthma, obesity s/p gastric sleeve in 2015, and GERD. Pt presented on 7/24 with AMS and was found to have metabolic acidosis and respiratory alkalosis with uncontrolled BG. LFTs were elevated on admission and GI was consulted. LFTs initially improved, then peaked on 06/11/23. On 8/1, UGI showed a gastric leak likely due to suture dehiscence of previous gastric sleeve performed in 2015. Surgery was consulted on 8/1 and patient was made NPO. A 10mL fluid collection was also noted adjacent to the stable line and the gastric perforation. IR has been consulted for drain placement. Pharmacy was consulted for TPN initiation on 8/2 due to gastric leak to provide nutrition.  Per patient, usual weight is 225lbs and has not lost weight recently to her knowledge. She was eating about 50% of meals last week, mainly because she did not like the food here. Normally she eats 3-4 meals a day at home including fruits, vegetables, hamburgers, [place] etc/   Glucose / Insulin: T2DM, A1c 11.7% (05/27/23), BG 160-221 since new TPN hung with 70u insulin, CBG in 24 h 160-221, 15u/24h rSSI  -PTA used insulin glargine 22 units at bedtime and insulin lispro 6 units TID Electrolytes: K 4.2, CoCa 10.2, Phos 3.1, Mg 1.9, HCO3: 21, Cl: 107  others WNL  Renal: SCr 0.56, BUN WNL Hepatic: 8/5: alk phos/ ALT/Tbili trending down,  AST wnl, albumin 1.9 Intake / Output; MIVF: UOP x2 documented, drains , LBM 8/3  GI Imaging: 7/24 KUB: cholelithiasis, hepatic steatosis  7/25 CT: no acute findings  7/30 MRI: LUQ gas and fluid collection 7/31 CT: large LUQ gas and fluid collection concerning for contained perforation/leak or  abscess  8/1 UGI: gastric leak likely due to suture dehiscence from prior sleeve gastrectomy GI Surgeries / Procedures: 8/2 Drain to LUQ abscess  8/5 EGD   Central access: PICC placed 06/16/23 TPN start date: 06/16/23  Nutritional Goals: Goal TPN rate is 85 mL/hr (provides 114 g of protein and 2149 kcals per day)  RD Assessment: Estimated Needs Total Energy Estimated Needs: 2100-2300 Total Protein Estimated Needs: 110-130 grams Total Fluid Estimated Needs: >2.0 L  Current Nutrition:  NPO + TPN   Plan:  Continue TPN at goal 85 ml/hr, meeting 100% estimated needs, providing 2103kcal and 110g AA.  Electrolytes in TPN: Increase Na 150 mEq/L, K 40 mEq/L (=79mEq, reduced 8/5), Ca 2 mEq/L, Mg 12 mEq/L, Phos 18 mmol/L. Cl:Ac 2:1, to 1:2 with low bicarb.  Add standard MVI and trace elements to TPN Continue Resistant q4h SSI.   Increase to 78 units of insulin regular in TPN, small decrease in CHO amount with formula re-adjustment.  Monitor TPN labs daily until stable at goal then on Mon/Thurs, will get labs again on Thursday.  F/u EGD moved to today 8/6, possible feeding tube placement   Estill Batten, PharmD, BCCCP  Clinical Pharmacist  Please check AMION for all [facility] Pharmacy phone numbers After 10:00 PM, call Main Pharmacy [phone]

## 2023-06-20 NOTE — Plan of Care (Signed)
  Problem: Nutritional: Goal: Maintenance of adequate nutrition will improve Outcome: Progressing   Problem: Respiratory: Goal: Will regain and/or maintain adequate ventilation Outcome: Progressing   Problem: Skin Integrity: Goal: Risk for impaired skin integrity will decrease Outcome: Progressing   Problem: Coping: Goal: Ability to adjust to condition or change in health will improve Outcome: Progressing   Problem: Tissue Perfusion: Goal: Adequacy of tissue perfusion will improve Outcome: Progressing

## 2023-06-20 NOTE — Progress Notes (Signed)
Frances Rodgers  ZOX:[id number] DOB: 1971/04/16 DOA: 06/07/2023 PCP: Tollie Eth, NP    Brief Narrative:  52 year old with a history of mild intermittent asthma and recently diagnosed DM who was brought to the ER by a family member with altered mental status.  Workup revealed elevated LFTs.  Significant Events: 8/2 percutaneous IR drain and L UQ collection yielding several 100 mL of purulent material 8/2 PICC line placed  Goals of Care:   Code Status: Full Code   DVT prophylaxis: enoxaparin (LOVENOX) injection 40 mg Start: 06/07/23 2000  Interim Hx: No acute issues encountered overnight.  Continues to have some pain in the left upper abdomen.  Afebrile.  Vital signs stable.  CBG trending upward somewhat.  Culture data from perigastric abscess revealing rare budding yeast, gram-negative rods, abundant gram-positive rods, and gram-positive cocci.   I spoke with the patient at length regarding her pain.  She states this is her most frustrating issue at present.  She states that the morphine controls the pain well but then wears off before her next dose is available.  She wishes to try a treatment regimen that could potentially give her more consistent control without her having to constantly ask for the medication when it is available.  Assessment & Plan:  9.2 cm collection of gas and fluid left upper quadrant - polymicrobial - Hx of gastric sleeve 2015 Incidentally noted on MRI abdomen -follow-up CT confirmed this finding with appearance worrisome for possible contained perforation given proximity to previous suture line from gastric bypass procedure - UGI 8/1 c/w a leak and contained perforation of the gastric fundus - General Surgery coordinating care with the GI team - continue strict NPO status with TNA support - s/p IR drainage of the fluid collection - for EGD 8/7 to investigate internal anatomy -will likely eventually require conversion to a Roux-en-Y bypass  Abdominal pain due to  above See discussion above - I will continue her as needed morphine but add low-dose scheduled morphine as well - if this does a good job we can consider transitioning to equivalent dose fentanyl patch for baseline control -there is no evidence at this time of oversedation or abuse and the patient seems clearly to only desire pain control  Acute metabolic encephalopathy of unclear etiology Possibly related to profound acid-base abnormalities/acute metabolic acidosis/respiratory alkalosis - UDS negative - UA without evidence of UTI - CXR with no clear infiltrate - thyroid function recently confirmed to be within normal limits - resolved  Acute transaminitis US revealed no obstructive gallstones and no suggestion of acute cholecystitis -CT abdomen 7/25 without acute findings -viral hepatitis panel negative -possible shock liver versus drug-induced liver injury -AMA and ASMA negative -CMV IgM negative -MRCP 7/21 revealed a mild intrahepatic bile duct dilatation with a focal area of narrowing involving the proximal common bile duct possibly representing a stricture -LFTs continue to improve and are now approaching normal -GI reports that further evaluation will be required when above issues are stabilized  Recent Labs  Lab 06/14/23 0405 06/15/23 0237 06/17/23 0454 06/19/23 0856  AST 93* 55* 31 35  ALT 422* 237* 113* 73*  ALKPHOS 303* 263* 194* 147*  BILITOT 4.9* 3.7* 2.8* 1.7*  PROT 6.6 5.8* 5.8* 6.4*  ALBUMIN 2.4* 2.0* 1.8* 1.9*    Acute kidney injury - resolved  Felt to be due to poor intake/dehydration -now resolved with normal creatinine/GFR  DM2 with starvation ketoacidosis CBG well-controlled at this time  Peripheral edema Venous duplex negative  for DVT  Chronic mild intermittent asthma Stable at present  Obesity - Body mass index is 37.24 kg/m.  Status post gastric sleeve 2015   Family Communication: No family present at time of exam Disposition: Discharge home will be  delayed due to need for TNA and strict n.p.o. status   Objective: Blood pressure 112/62, pulse 96, temperature 98.2 F (36.8 C), resp. rate 18, height 5\' 5"  (1.651 m), weight 101.5 kg, last menstrual period 06/08/2023, SpO2 100%.  Intake/Output Summary (Last 24 hours) at 06/20/2023 0956 Last data filed at 06/20/2023 0947 Gross per 24 hour  Intake 963.94 ml  Output 115 ml  Net 848.94 ml   Filed Weights   06/16/23 0433 06/17/23 0500 06/20/23 0350  Weight: 101 kg 101.3 kg 101.5 kg    Examination: General: No acute respiratory distress Lungs: Clear to auscultation bilaterally Cardiovascular: Regular rate and rhythm without murmur  Abdomen: Soft, bowel sounds positive, drain in place, no rebound Extremities: No significant edema B LE   CBC: Recent Labs  Lab 06/15/23 0237 06/17/23 0454 06/19/23 0856  WBC 6.4 4.6 4.8  HGB 8.9* 8.4* 8.4*  HCT 29.3* 28.2* 29.6*  MCV 78.6* 84.9 86.0  PLT 251 190 179   Basic Metabolic Panel: Recent Labs  Lab 06/18/23 0338 06/19/23 0856 06/20/23 0835  NA 136 136 135  K 4.1 4.2 4.2  CL 107 106 107  CO2 22 22 21*  GLUCOSE 204* 174* 221*  BUN 11 13 15   CREATININE 0.68 0.56 0.57  CALCIUM 8.7* 8.4* 8.5*  MG 1.8 1.8 1.9  PHOS 2.3* 2.9 3.1   GFR: Estimated Creatinine Clearance: 99.3 mL/min (by C-G formula based on SCr of 0.57 mg/dL).   Scheduled Meds:  Chlorhexidine Gluconate Cloth  6 each Topical Daily   enoxaparin (LOVENOX) injection  40 mg Subcutaneous Q24H   insulin aspart  0-20 Units Subcutaneous Q4H   pantoprazole (PROTONIX) IV  40 mg Intravenous Q12H   sodium chloride flush  10-40 mL Intracatheter Q12H   sodium chloride flush  5 mL Intracatheter Q8H      LOS: 12 days   Lonia Blood, MD [facility] Office  [phone] Pager - Text Page per [name]  If 7PM-7AM, please contact night-coverage per Amion 06/20/2023, 9:56 AM

## 2023-06-21 ENCOUNTER — Encounter (HOSPITAL_COMMUNITY): Payer: Self-pay | Admitting: Internal Medicine

## 2023-06-21 ENCOUNTER — Inpatient Hospital Stay (HOSPITAL_COMMUNITY): Payer: 59 | Admitting: Anesthesiology

## 2023-06-21 ENCOUNTER — Encounter (HOSPITAL_COMMUNITY)
Admission: EM | Disposition: A | Discharge status: Home / Self Care | Payer: Self-pay | Source: Ambulatory Visit | Attending: Internal Medicine

## 2023-06-21 ENCOUNTER — Inpatient Hospital Stay (HOSPITAL_COMMUNITY): Payer: 59

## 2023-06-21 DIAGNOSIS — E1110 Type 2 diabetes mellitus with ketoacidosis without coma: Secondary | ICD-10-CM

## 2023-06-21 DIAGNOSIS — K316 Fistula of stomach and duodenum: Secondary | ICD-10-CM

## 2023-06-21 DIAGNOSIS — K2289 Other specified disease of esophagus: Secondary | ICD-10-CM

## 2023-06-21 DIAGNOSIS — K9189 Other postprocedural complications and disorders of digestive system: Secondary | ICD-10-CM

## 2023-06-21 DIAGNOSIS — K255 Chronic or unspecified gastric ulcer with perforation: Secondary | ICD-10-CM | POA: Diagnosis not present

## 2023-06-21 DIAGNOSIS — Z6837 Body mass index (BMI) 37.0-37.9, adult: Secondary | ICD-10-CM

## 2023-06-21 DIAGNOSIS — K449 Diaphragmatic hernia without obstruction or gangrene: Secondary | ICD-10-CM | POA: Diagnosis not present

## 2023-06-21 DIAGNOSIS — Z9884 Bariatric surgery status: Secondary | ICD-10-CM

## 2023-06-21 DIAGNOSIS — K3189 Other diseases of stomach and duodenum: Secondary | ICD-10-CM | POA: Diagnosis not present

## 2023-06-21 DIAGNOSIS — J45909 Unspecified asthma, uncomplicated: Secondary | ICD-10-CM

## 2023-06-21 HISTORY — PX: BIOPSY: SHX5522

## 2023-06-21 HISTORY — PX: ESOPHAGOGASTRODUODENOSCOPY (EGD) WITH PROPOFOL: SHX5813

## 2023-06-21 LAB — GLUCOSE, CAPILLARY
Glucose-Capillary: 134 mg/dL — ABNORMAL HIGH (ref 70–99)
Glucose-Capillary: 159 mg/dL — ABNORMAL HIGH (ref 70–99)
Glucose-Capillary: 147 mg/dL — ABNORMAL HIGH (ref 70–99)
Glucose-Capillary: 151 mg/dL — ABNORMAL HIGH (ref 70–99)
Glucose-Capillary: 166 mg/dL — ABNORMAL HIGH (ref 70–99)

## 2023-06-21 LAB — BRAIN NATRIURETIC PEPTIDE: B Natriuretic Peptide: 120 pg/mL — ABNORMAL HIGH (ref 0.0–100.0)

## 2023-06-21 SURGERY — ESOPHAGOGASTRODUODENOSCOPY (EGD) WITH PROPOFOL
Anesthesia: Monitor Anesthesia Care

## 2023-06-21 MED ORDER — FENTANYL CITRATE (PF) 100 MCG/2ML IJ SOLN
25.0000 ug | Freq: Once | INTRAMUSCULAR | Status: AC
  Administered 2023-06-21: 25 ug via INTRAVENOUS

## 2023-06-21 MED ORDER — MORPHINE SULFATE (PF) 2 MG/ML IV SOLN: 4.0000 mg | Freq: Once | INTRAVENOUS | Status: DC

## 2023-06-21 MED ORDER — FENTANYL CITRATE (PF) 100 MCG/2ML IJ SOLN
INTRAMUSCULAR | Status: AC
  Filled 2023-06-21: qty 2

## 2023-06-21 MED ORDER — SUCCINYLCHOLINE CHLORIDE 200 MG/10ML IV SOSY
PREFILLED_SYRINGE | INTRAVENOUS | Status: DC | PRN
  Administered 2023-06-21: 140 mg via INTRAVENOUS

## 2023-06-21 MED ORDER — FENTANYL CITRATE (PF) 100 MCG/2ML IJ SOLN
INTRAMUSCULAR | Status: DC | PRN
  Administered 2023-06-21: 100 ug via INTRAVENOUS

## 2023-06-21 MED ORDER — FENTANYL CITRATE (PF) 100 MCG/2ML IJ SOLN: 25.0000 ug | Freq: Once | INTRAMUSCULAR | Status: DC

## 2023-06-21 MED ORDER — AMISULPRIDE (ANTIEMETIC) 5 MG/2ML IV SOLN
10.0000 mg | Freq: Once | INTRAVENOUS | Status: AC
  Administered 2023-06-21: 10 mg via INTRAVENOUS

## 2023-06-21 MED ORDER — FUROSEMIDE 10 MG/ML IJ SOLN
40.0000 mg | Freq: Every day | INTRAMUSCULAR | Status: DC
  Administered 2023-06-22 – 2023-06-23 (×2): 40 mg via INTRAVENOUS
  Filled 2023-06-21 (×2): qty 4

## 2023-06-21 MED ORDER — FENTANYL CITRATE (PF) 100 MCG/2ML IJ SOLN
25.0000 ug | Freq: Once | INTRAMUSCULAR | Status: DC
Start: 2023-06-21 — End: 2023-06-21

## 2023-06-21 MED ORDER — FUROSEMIDE 10 MG/ML IJ SOLN
40.0000 mg | Freq: Every day | INTRAMUSCULAR | Status: DC
Start: 2023-06-21 — End: 2023-06-21

## 2023-06-21 MED ORDER — PROPOFOL 500 MG/50ML IV EMUL
INTRAVENOUS | Status: DC | PRN
  Administered 2023-06-21: 125 ug/kg/min via INTRAVENOUS

## 2023-06-21 MED ORDER — SIMETHICONE 40 MG/0.6ML PO SUSP
200.0000 mg | ORAL | Status: AC
  Administered 2023-06-21: 200 mg via ORAL
  Filled 2023-06-21: qty 3

## 2023-06-21 MED ORDER — GLYCOPYRROLATE 0.2 MG/ML IJ SOLN
INTRAMUSCULAR | Status: DC | PRN
  Administered 2023-06-21: .2 mg via INTRAVENOUS

## 2023-06-21 MED ORDER — AMISULPRIDE (ANTIEMETIC) 5 MG/2ML IV SOLN
INTRAVENOUS | Status: AC
  Filled 2023-06-21: qty 4

## 2023-06-21 MED ORDER — MORPHINE SULFATE (PF) 2 MG/ML IV SOLN: 2.0000 mg | Freq: Once | INTRAVENOUS | Status: DC

## 2023-06-21 MED ORDER — SODIUM CHLORIDE 0.9 % IV SOLN
3.0000 g | Freq: Four times a day (QID) | INTRAVENOUS | Status: DC
  Administered 2023-06-21 – 2023-06-25 (×17): 3 g via INTRAVENOUS
  Filled 2023-06-21 (×19): qty 8

## 2023-06-21 MED ORDER — LACTATED RINGERS IV SOLN
INTRAVENOUS | Status: AC | PRN
  Administered 2023-06-21: 1000 mL via INTRAVENOUS

## 2023-06-21 MED ORDER — TRAVASOL 10 % IV SOLN
INTRAVENOUS | Status: AC
  Filled 2023-06-21: qty 1100

## 2023-06-21 MED ORDER — PROPOFOL 10 MG/ML IV BOLUS
INTRAVENOUS | Status: DC | PRN
  Administered 2023-06-21: 60 mg via INTRAVENOUS
  Administered 2023-06-21: 50 mg via INTRAVENOUS

## 2023-06-21 SURGICAL SUPPLY — 15 items

## 2023-06-21 NOTE — Progress Notes (Addendum)
PHARMACY - TOTAL PARENTERAL NUTRITION CONSULT NOTE   Indication:  gastric leak  Patient Measurements: Height: 5\' 5"  (165.1 cm) Weight: 101.3 kg (223 lb 5.2 oz) IBW/kg (Calculated) : 57   Body mass index is 37.16 kg/m. Adj Body Weight: 75 kg Usual Weight: ~97.3 kg  Assessment:  52 yo F with PMH of T2DM, asthma, obesity s/p gastric sleeve in 2015, and GERD. Pt presented on 7/24 with AMS and was found to have metabolic acidosis and respiratory alkalosis with uncontrolled BG. LFTs were elevated on admission and GI was consulted. LFTs initially improved, then peaked on 06/11/23. On 8/1, UGI showed a gastric leak likely due to suture dehiscence of previous gastric sleeve performed in 2015. Surgery was consulted on 8/1 and patient was made NPO. A 10mL fluid collection was also noted adjacent to the stable line and the gastric perforation. IR has been consulted for drain placement. Pharmacy was consulted for TPN initiation on 8/2 due to gastric leak to provide nutrition.  Per patient, usual weight is 225lbs and has not lost weight recently to her knowledge. She was eating about 50% of meals last week, mainly because she did not like the food here. Normally she eats 3-4 meals a day at home including fruits, vegetables, hamburgers, [place] etc/   Glucose / Insulin: T2DM, A1c 11.7% (05/27/23), currently 78u within TPN  CBG while on new TPN 134-158  -PTA used insulin glargine 22 units at bedtime and insulin lispro 6 units TID Electrolytes: 8/6: K 4.2, CoCa 10.2, Phos 3.1, Mg 1.9, HCO3: 21, Cl: 107  others WNL  Renal: 8/6: SCr 0.56, BUN WNL Hepatic: 8/5: alk phos/ ALT/Tbili trending down,  AST wnl, albumin 1.9 Intake / Output; MIVF: UOP x5 documented, drains 90ml, LBM 8/3  GI Imaging: 7/24 KUB: cholelithiasis, hepatic steatosis  7/25 CT: no acute findings  7/30 MRI: LUQ gas and fluid collection 7/31 CT: large LUQ gas and fluid collection concerning for contained perforation/leak or abscess  8/1 UGI:  gastric leak likely due to suture dehiscence from prior sleeve gastrectomy GI Surgeries / Procedures: 8/2 Drain to LUQ abscess  8/5 EGD   Central access: PICC placed 06/16/23 TPN start date: 06/16/23  Nutritional Goals: Goal TPN rate is 85 mL/hr (provides 114 g of protein and 2149 kcals per day)  RD Assessment: Estimated Needs Total Energy Estimated Needs: 2100-2300 Total Protein Estimated Needs: 110-130 grams Total Fluid Estimated Needs: >2.0 L  Current Nutrition:  NPO + TPN   Plan:  Continue TPN at goal 85 ml/hr, meeting 100% estimated needs, providing 2103kcal and 110g AA.  Electrolytes in TPN: Na 150 mEq/L (increased 8/6), K 40 mEq/L (=22mEq, reduced 8/5), Ca 2 mEq/L, Mg 12 mEq/L, Phos 18 mmol/L. Cl:Ac 1:2  Add standard MVI and trace elements to TPN Continue Resistant q4h SSI.   Continue 78 units of insulin regular in TPN  Monitor TPN labs daily until stable at goal then on Mon/Thurs.  F/u EGD moved to today 8/7 at 1500 per documentation, possible feeding tube placement, further plans pending results of the procedure.   Estill Batten, PharmD, BCCCP  Clinical Pharmacist  Please check AMION for all [facility] Pharmacy phone numbers After 10:00 PM, call Main Pharmacy [phone]

## 2023-06-21 NOTE — Progress Notes (Signed)
Initial Nutrition Assessment  DOCUMENTATION CODES:   Obesity unspecified  INTERVENTION:   Continue TPN management per Pharmacy   NUTRITION DIAGNOSIS:   Inadequate oral intake related to altered GI function (perforation of the gastric fundus) as evidenced by NPO status.  GOAL:   Patient will meet greater than or equal to 90% of their needs  MONITOR:   Diet advancement, Labs, Weight trends, I & O's, Other (TPN)  REASON FOR ASSESSMENT:   Consult New TPN/TNA  ASSESSMENT:   52 year old female who presented to the ED on 7/24 with AMS and weakness. PMH of newly diagnosed T2DM, asthma, GERD, obesity s/p sleeve gastrectomy in 2015. Pt admitted with acute metabolic encephalopathy, acute transaminitis, AKI.  07/24 - Carb Modified diet 08/01 - CT abdomen/pelvis showing a large fluid collection adjacent to the stomach, NPO 08/02 - UGI showing contrast extraversion concerning for perforation of the gastric fundus presumably due to suture dehiscence from prior sleeve gastrectomy 8/7 EGD, pending   Visited patient at bedside who is tolerating TPN at goal rate and reports she is hungry and desperate for a liquid diet at least. Patient to undergo EGD today and hopefully she can get a po diet if GI sees fit  Patient was grossly edematous   She denies N/V/D and reports constipation. She denies abdominal distension. RD explained to her that she is likely not to have any significant BM's while NPO and on TPN.   Labs: reviewed  Meds: reviewed  I/O's: +5.8 L, no UOP    Diet Order:   Diet Order             Diet NPO time specified  Diet effective now                   EDUCATION NEEDS:   No education needs have been identified at this time  Skin:  Skin Assessment: Reviewed RN Assessment  Last BM:  8/1 type 5  Height:   Ht Readings from Last 1 Encounters:  06/16/23 5\' 5"  (1.651 m)    Weight:   Wt Readings from Last 1 Encounters:  06/21/23 101.3 kg    Ideal Body  Weight:  56.8 kg  BMI:  Body mass index is 37.16 kg/m.  Estimated Nutritional Needs:   Kcal:  2100-2300  Protein:  110-130 grams  Fluid:  >2.0 L  Leodis Rains, RDN, LDN  Clinical Nutrition

## 2023-06-21 NOTE — Op Note (Signed)
[facility] Patient Name: Frances Rodgers Procedure Date : 06/21/2023 MRN: [id number] Attending MD: Corliss Parish , MD, [id number] Date of Birth: August 09, 1971 CSN: [id number] Age: 52 Admit Type: Inpatient Procedure:                Upper GI endoscopy Indications:              Abnormal UGI series, Abnormal CT of the GI tract Providers:                Corliss Parish, MD, Stephens Shire RN, RN,                            Salley Scarlet, Technician, Meriam Sprague C,CRNA Referring MD:             Inpatient Medical Service & Surgical Service Medicines:                Monitored Anesthesia Care Complications:            No immediate complications. Estimated Blood Loss:     Estimated blood loss was minimal. Procedure:                Pre-Anesthesia Assessment:                           - Prior to the procedure, a History and Physical                            was performed, and patient medications and                            allergies were reviewed. The patient's tolerance of                            previous anesthesia was also reviewed. The risks                            and benefits of the procedure and the sedation                            options and risks were discussed with the patient.                            All questions were answered, and informed consent                            was obtained. Prior Anticoagulants: The patient has                            taken no anticoagulant or antiplatelet agents. ASA                            Grade Assessment: III - A patient with severe                            systemic disease. After reviewing the risks and  benefits, the patient was deemed in satisfactory                            condition to undergo the procedure.                           After obtaining informed consent, the endoscope was                            passed under direct vision. Throughout the                             procedure, the patient's blood pressure, pulse, and                            oxygen saturations were monitored continuously. The                            [id number] ([id number]) Olympus endoscope was                            introduced through the mouth, and advanced to the                            second part of duodenum. The upper GI endoscopy was                            technically difficult and complex due to                            post-surgical anatomy. Successful completion of the                            procedure was aided by performing the maneuvers                            documented (below) in this report. The [id number]-[id number]                            ([id number]) Olympus slim endoscope was introduced                            through the mouth, and advanced to the second part                            of duodenum. The patient tolerated the procedure. Scope In: Scope Out: Findings:      No gross lesions were noted in the entire esophagus.      The Z-line was irregular and was found 39 cm from the incisors.      A 1 cm hiatal hernia was present.      Evidence of a sleeve gastrectomy was found in the cardia.      A medium fistula was found in the cardia. 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 I was able to visualize the rest of the       gastric tissue. Wire placement was made. There is what appears to be a       band of normal gastric tissue in place over the fistula. I could       retroflex and see this region as well..      A large fistula was found in the cardia. Using a wire I was able to       visualize this area. This led to the contained perforation/collection       with evidence of the previously placed percutaneous pigtail drain. The       wall of the collection itself looks healthy in appearance however and       there was not a significant amount of fluid within.      Patchy moderately erythematous mucosa without bleeding was found  in the       entire examined stomach. Biopsies were taken with a cold forceps for       histology and Helicobacter pylori testing.      No gross lesions were noted in the duodenal bulb, in the first portion       of the duodenum and in the second portion of the duodenum. Impression:               - No gross lesions in the entire esophagus.                           - Z-line irregular, 39 cm from the incisors.                           - 1 cm hiatal hernia.                           - A sleeve gastrectomy was found.                           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                           - Erythematous mucosa in the stomach. Biopsied.                           - No gross lesions in the duodenal bulb, in the  first portion of the duodenum and in the second                            portion of the duodenum. Recommendation:           - The patient will be observed post-procedure,                            until all discharge criteria are met.                           - Return patient to hospital ward for ongoing care.                           - Monitor closely for evidence of progressive                            issues/pain. She would be at risk as discussed on                            the pre-procedure notation of microperforation.                           - TPN for now.                           - Observe patient's clinical course.                           - Happy to be available for further discussions                            with surgery in future.                           - From a GI perspective no further Inpatient GI                            workup is required at this time.                            - The findings and recommendations were discussed                            with the referring physician.                           - The findings and recommendations were discussed                            with the patient. Procedure Code(s):        --- Professional ---                           [phone], Esophagogastroduodenoscopy, flexible,  transoral; with biopsy, single or multiple Diagnosis Code(s):        --- Professional ---                           K22.89, Other specified disease of esophagus                           K44.9, Diaphragmatic hernia without obstruction or                            gangrene                           Z98.84, Bariatric surgery status                           K31.6, Fistula of stomach and duodenum                           K31.89, Other diseases of stomach and duodenum                           R93.3, Abnormal findings on diagnostic imaging of                            other parts of digestive tract CPT copyright 2022 [employer]. All rights reserved. The codes documented in this report are preliminary and upon coder review may  be revised to meet current compliance requirements. Corliss Parish, MD 06/21/2023 6:18:50 PM Number of Addenda: 0

## 2023-06-21 NOTE — Progress Notes (Signed)
[facility] Progress Note     Subjective: Endorsing persistent pain. Denies new complaints.    Objective: Vital signs in last 24 hours: Temp:  [97.5 F (36.4 C)-98.3 F (36.8 C)] 98.3 F (36.8 C) (08/07 0501) Pulse Rate:  [86-96] 86 (08/07 0501) Resp:  [18-19] 18 (08/07 0501) BP: (112-124)/(59-71) 112/59 (08/07 0501) SpO2:  [100 %] 100 % (08/07 0501) Weight:  [101.3 kg] 101.3 kg (08/07 0241) Last BM Date : 06/17/23  Intake/Output from previous day: 08/06 0701 - 08/07 0700 In: 920 [I.V.:310.6; IV Piggyback:604.4] Out: 90 [Drains:90] Intake/Output this shift: No intake/output data recorded.  PE: Gen:  Alert Abd: Soft, tenderness on mostly left side of the abdomen around her drain site.  JP w/ SS output Psych: A&Ox4   Lab Results:  Recent Labs    06/19/23 0856  WBC 4.8  HGB 8.4*  HCT 29.6*  PLT 179   BMET Recent Labs    06/19/23 0856 06/20/23 0835  NA 136 135  K 4.2 4.2  CL 106 107  CO2 22 21*  GLUCOSE 174* 221*  BUN 13 15  CREATININE 0.56 0.57  CALCIUM 8.4* 8.5*   PT/INR No results for input(s): "LABPROT", "INR" in the last 72 hours.  CMP     Component Value Date/Time   NA 135 06/20/2023 0835   NA 136 09/30/2022 1457   NA 137 12/24/2013 1044   K 4.2 06/20/2023 0835   K 3.8 12/24/2013 1044   CL 107 06/20/2023 0835   CL 103 12/24/2013 1044   CO2 21 (L) 06/20/2023 0835   CO2 26 12/24/2013 1044   GLUCOSE 221 (H) 06/20/2023 0835   GLUCOSE 116 (H) 12/24/2013 1044   BUN 15 06/20/2023 0835   BUN 12 09/30/2022 1457   BUN 8 12/24/2013 1044   CREATININE 0.57 06/20/2023 0835   CREATININE 1.06 12/24/2013 1044   CALCIUM 8.5 (L) 06/20/2023 0835   CALCIUM 9.0 12/24/2013 1044   PROT 6.4 (L) 06/19/2023 0856   PROT 7.0 09/30/2022 1457   PROT 7.4 12/24/2013 1044   ALBUMIN 1.9 (L) 06/19/2023 0856   ALBUMIN 3.9 09/30/2022 1457   ALBUMIN 3.4 12/24/2013 1044   AST 35 06/19/2023 0856   AST 17 12/24/2013 1044   ALT 73 (H) 06/19/2023 0856   ALT  25 12/24/2013 1044   ALKPHOS 147 (H) 06/19/2023 0856   ALKPHOS 76 12/24/2013 1044   BILITOT 1.7 (H) 06/19/2023 0856   BILITOT 0.3 09/30/2022 1457   BILITOT 0.5 12/24/2013 1044   GFRNONAA >60 06/20/2023 0835   GFRNONAA >60 12/24/2013 1044   GFRAA 92 07/08/2020 1614   GFRAA >60 12/24/2013 1044   Lipase     Component Value Date/Time   LIPASE 64 (H) 06/11/2023 0036   LIPASE 92 12/24/2013 1044       Studies/Results: No results found.  Anti-infectives: Anti-infectives (From admission, onward)    Start     Dose/Rate Route Frequency Ordered Stop   06/21/23 1200  Ampicillin-Sulbactam (UNASYN) 3 g in sodium chloride 0.9 % 100 mL IVPB        3 g 200 mL/hr over 30 Minutes Intravenous Every 6 hours 06/21/23 0807     06/20/23 1045  fluconazole (DIFLUCAN) IVPB 200 mg        200 mg 100 mL/hr over 60 Minutes Intravenous Every 24 hours 06/20/23 0955     06/16/23 1730  piperacillin-tazobactam (ZOSYN) IVPB 3.375 g  Status:  Discontinued        3.375 g  12.5 mL/hr over 240 Minutes Intravenous Every 8 hours 06/16/23 1634 06/21/23 0807        Assessment/Plan Hx of prior gastric sleeve at [facility] in 2015 10 cm fluid collection adjacent to staple line secondary to a contained gastric perforation - EGD w/ GI today.  Additional plans pending results of procedure - s/p IR drain into LUQ collection 8/2 >> several hundred mL purulent material aspirated, culture ABUNDANT KLEBSIELLA PNEUMONIAE  ABUNDANT LACTOBACILLUS SPECIES  - continue strict NPO, TPN - Surgical intervention is not indicated right now as the ultimate treatment will be decompressing the narrowing with EGD, +/- stenting (if this is etiology) and then after the leak heals possible conversion to a roux-en-y bypass. - cont unasyn and diflucan -discussed patient with primary service.    FEN: NPO/TNA VTE: LMWH ID: unasyn/likely diflucan    - per TRH -  Asthma Recent dx of IDDM, DKA, resolved AKI, resolved Transaminitis,  improving  I reviewed Consultant GI notes, hospitalist notes, last 24 h vitals and pain scores, last 48 h intake and output, last 24 h labs and trends, and last 24 h imaging results.    LOS: 13 days    Moise Boring, MD [facility] Surgery 06/21/2023, 8:17 AM Please see Amion for pager number during day hours 7:00am-4:30pm

## 2023-06-21 NOTE — Interval H&P Note (Signed)
History and Physical Interval Note:  06/21/2023 3:30 PM  Frances Rodgers  has presented today for surgery, with the diagnosis of Perforated gastric sleeve anastomosis, evaluate for endoscopic intervention.  The various methods of treatment have been discussed with the patient and family. After consideration of risks, benefits and other options for treatment, the patient has consented to  Procedure(s): ESOPHAGOGASTRODUODENOSCOPY (EGD) WITH PROPOFOL (N/A) as a surgical intervention.  The patient's history has been reviewed, patient examined, no change in status, stable for surgery.  I have reviewed the patient's chart and labs.  Questions were answered to the patient's satisfaction.    This is an increased risk EGD but worth attempting to visualize what could be dehiscence versus perforation versus other etiology.  Patient understands the risks of causing further perforation that could require emergent/urgent surgery are present such that our surgical service is available if needed.  If we are able to find a perforation will attempt to see if it can endoscopically be closed with clips versus stenting though have concerned about potential migration.  Will need to see what things look like and make the best determination for the individual patient.  We will try to help her as safely as we can but the risks are present no matter what.  Patient agrees to this plan of action and understands the risks.   [employer]

## 2023-06-21 NOTE — Progress Notes (Signed)
Referring Physician(s): [name]   Supervising Physician: Marliss Coots  Patient Status:  [employer] - In-pt  Chief Complaint:  Gastric perforation with fluid collection adjacent to the stomach S/p drain placement by Dr. Archer Asa on 06/16/23  Subjective:  Patient laying in bed, NAD, reports that she is going all right.  States that the output from the drain seems slowing down, she is scheduled for EGD around 2:30 today.   Allergies: Aspirin, Ketorolac, and Ketorolac tromethamine  Medications: Prior to Admission medications   Medication Sig Start Date End Date Taking? Authorizing Provider  albuterol (VENTOLIN HFA) 108 (90 Base) MCG/ACT inhaler Inhale 2 puffs into the lungs every 6 (six) hours as needed for wheezing or shortness of breath.   Yes [provider]  Cetirizine HCl 10 MG CAPS Take 10 mg by mouth daily as needed (allergy). 06/02/03  Yes [provider]  FLOVENT DISKUS 50 MCG/ACT AEPB Take 1 Inhalation by mouth every 12 (twelve) hours as needed (SOB).   Yes [provider]  insulin glargine (LANTUS) 100 UNIT/ML Solostar Pen Inject 22 Units into the skin at bedtime. May substitute as needed per insurance. Patient taking differently: Inject 22 Units into the skin at bedtime. 06/01/23 08/30/23 Yes Darlin Priestly, MD  insulin lispro (HUMALOG) 100 UNIT/ML KwikPen Inject 6 Units into the skin 3 (three) times daily with meals. Only take if eating a meal AND Blood Glucose (BG) is 80 or higher. Patient taking differently: Inject 6 Units into the skin 3 (three) times daily with meals. 06/01/23 08/30/23 Yes Darlin Priestly, MD  JUNEL FE 24 1-20 MG-MCG(24) tablet TAKE 1 TABLET BY MOUTH DAILY Patient taking differently: Take 1 tablet by mouth at bedtime. 02/14/23  Yes Hildred Laser, MD  montelukast (SINGULAIR) 10 MG tablet Take 10 mg by mouth at bedtime.   Yes [provider]  Multiple Vitamin (MULTI VITAMIN DAILY PO) Take 1 tablet by mouth daily.   Yes [provider]  pantoprazole (PROTONIX) 40 MG tablet Take 40 mg by mouth daily as needed (acid reflex). 04/12/19  Yes [provider]  QVAR REDIHALER 40 MCG/ACT inhaler Inhale 1 puff into the lungs 2 (two) times daily. 12/09/22  Yes [provider]  Blood Glucose Monitoring Suppl DEVI 1 each by Does not apply route 3 (three) times daily. May dispense any manufacturer covered by patient's insurance. 06/01/23   Darlin Priestly, MD  Glucose Blood (BLOOD GLUCOSE TEST STRIPS) STRP 1 each by Does not apply route 3 (three) times daily. Use as directed to check blood sugar. May dispense any manufacturer covered by patient's insurance and fits patient's device. 06/01/23   Darlin Priestly, MD  Insulin Pen Needle (PEN NEEDLES) 31G X 5 MM MISC 1 each by Does not apply route 3 (three) times daily. May dispense any manufacturer covered by patient's insurance. 06/01/23   Darlin Priestly, MD  Lancet Device MISC 1 each by Does not apply route 3 (three) times daily. May dispense any manufacturer covered by patient's insurance. 06/01/23   Darlin Priestly, MD  Lancets MISC 1 each by Does not apply route 3 (three) times daily. Use as directed to check blood sugar. May dispense any manufacturer covered by patient's insurance and fits patient's device. 06/01/23   Darlin Priestly, MD     Vital Signs: BP 120/72 (BP Location: Left Arm)   Pulse 88   Temp 98.2 F (36.8 C)   Resp 16   Ht 5\' 5"  (1.651 m)   Wt 223 lb  5.2 oz (101.3 kg)   LMP 06/08/2023   SpO2 100%   BMI 37.16 kg/m   Physical Exam Vitals reviewed.  Constitutional:      General: She is not in acute distress.    Appearance: She is not ill-appearing.  HENT:     Head: Normocephalic and atraumatic.  Pulmonary:     Effort: Pulmonary effort is normal.  Abdominal:     General: Abdomen is flat.     Palpations: Abdomen is soft.     Comments: Positive LUQ drain to a suction bulb. Site is unremarkable with no erythema, edema, tenderness, bleeding or drainage. Suture and stat  lock in place. Dressing is clean, dry, and intact.Cloudy, pink colored fluid noted in the bulb. Drain aspirates and flushes well.    Musculoskeletal:     Cervical back: Neck supple.  Skin:    General: Skin is warm and dry.  Neurological:     Mental Status: She is alert.  Psychiatric:        Mood and Affect: Mood normal.        Behavior: Behavior normal.        Judgment: Judgment normal.     Imaging: No results found.  Labs:  CBC: Recent Labs    06/14/23 0405 06/15/23 0237 06/17/23 0454 06/19/23 0856  WBC 6.9 6.4 4.6 4.8  HGB 10.8* 8.9* 8.4* 8.4*  HCT 34.8* 29.3* 28.2* 29.6*  PLT 268 251 190 179    COAGS: Recent Labs    06/13/23 0020 06/14/23 0405 06/15/23 0237 06/16/23 1136  INR 2.2* 1.5* 1.6* 1.4*    BMP: Recent Labs    06/17/23 0454 06/18/23 0338 06/19/23 0856 06/20/23 0835  NA 133* 136 136 135  K 3.5 4.1 4.2 4.2  CL 102 107 106 107  CO2 26 22 22  21*  GLUCOSE 296* 204* 174* 221*  BUN 10 11 13 15   CALCIUM 8.2* 8.7* 8.4* 8.5*  CREATININE 0.78 0.68 0.56 0.57  GFRNONAA >60 >60 >60 >60    LIVER FUNCTION TESTS: Recent Labs    06/14/23 0405 06/15/23 0237 06/17/23 0454 06/19/23 0856  BILITOT 4.9* 3.7* 2.8* 1.7*  AST 93* 55* 31 35  ALT 422* 237* 113* 73*  ALKPHOS 303* 263* 194* 147*  PROT 6.6 5.8* 5.8* 6.4*  ALBUMIN 2.4* 2.0* 1.8* 1.9*    Assessment and Plan:  52 y.o. female with gastric sleeve in 2015, admitted due to RUQ pain, found to have large left upper quadrant gas and fluid collection and  contrast extravasation seen in UGI study 06/15/23 concerning for perforation, s/p drain placement by Dr. Archer Asa on 06/16/23 - several hundred mL purulent material aspirated.   No cbc since 8/5, was stable  VSS Cx Klebsiella pneumoniae - pt on Unasyn and diflucan  EGD today  Overnight output 90 mL, pink cloudy fluid in the bulb today   Drain Location: LUQ Size: Fr size: 12 Fr Date of placement: 8/2  Currently to: Drain collection device:  suction bulb 24 hour output:  Output by Drain (mL) 06/19/23 0701 - 06/19/23 1900 06/19/23 1901 - 06/20/23 0700 06/20/23 0701 - 06/20/23 1900 06/20/23 1901 - 06/21/23 0700 06/21/23 0701 - 06/21/23 1048  Closed System Drain 1 Left LUQ Bulb (JP) 10 Fr. 40 125 40 50     Interval imaging/drain manipulation:  None  Current examination: Flushes/aspirates easily.  Insertion site unremarkable. Suture and stat lock in place. Dressed appropriately.   Plan: Continue TID flushes with 5 cc NS. Record output Q  shift. Dressing changes QD or PRN if soiled.  Call IR APP or on call IR MD if difficulty flushing or sudden change in drain output.  Repeat imaging/possible drain injection once output < 10 mL/QD (excluding flush material). Consideration for drain removal if output is < 10 mL/QD (excluding flush material), pending discussion with the providing surgical service.  Discharge planning: Please contact IR APP or on call IR MD prior to patient d/c to ensure appropriate follow up plans are in place. Typically patient will follow up with IR clinic 10-14 days post d/c for repeat imaging/possible drain injection. IR scheduler will contact patient with date/time of appointment. Patient will need to flush drain QD with 5 cc NS, record output QD, dressing changes every 2-3 days or earlier if soiled.   IR will continue to follow - please call with questions or concerns.   Electronically Signed: Willette Brace, PA-C 06/21/2023, 10:41 AM   I spent a total of 15 Minutes at the the patient's bedside AND on the patient's hospital floor or unit, greater than 50% of which was counseling/coordinating care for LUQ drain f/u.   This chart was dictated using voice recognition software.  Despite best efforts to proofread,  errors can occur which can change the documentation meaning.

## 2023-06-21 NOTE — Transfer of Care (Signed)
Immediate Anesthesia Transfer of Care Note  Patient: Frances Rodgers  Procedure(s) Performed: ESOPHAGOGASTRODUODENOSCOPY (EGD) WITH PROPOFOL BIOPSY  Patient Location: PACU  Anesthesia Type:General  Level of Consciousness: awake, oriented, drowsy, and patient cooperative  Airway & Oxygen Therapy: Patient Spontanous Breathing  Post-op Assessment: Report given to RN, Post -op Vital signs reviewed and stable, and Patient moving all extremities  Post vital signs: Reviewed and stable  Last Vitals:  Vitals Value Taken Time  BP 131/73 06/21/23 1750  Temp    Pulse 107 06/21/23 1754  Resp 18 06/21/23 1755  SpO2 94 % 06/21/23 1754  Vitals shown include unfiled device data.  Last Pain:  Vitals:   06/21/23 1415  TempSrc: Temporal  PainSc: 8       Patients Stated Pain Goal: 1 (06/20/23 1802)  Complications: No notable events documented.

## 2023-06-21 NOTE — Progress Notes (Signed)
PROGRESS NOTE    Frances Rodgers  UJW:[id number] DOB: [birth year] DOA: 06/07/2023 PCP: Tollie Eth, NP  Chief Complaint  Patient presents with   Altered Mental Status    Brief Narrative:   52 year old with Frances Rodgers history of mild intermittent asthma and recently diagnosed DM who was brought to the ER by Frances Rodgers family member with altered mental status.  Workup revealed elevated LFTs.   Significant Events: 8/2 percutaneous IR drain and L UQ collection yielding several 100 mL of purulent material 8/2 PICC line placed    Assessment & Plan:   Principal Problem:   AMS (altered mental status) Active Problems:   DKA (diabetic ketoacidosis) (HCC)   Acute metabolic encephalopathy   Asthma   Nausea & vomiting   Transaminitis   Gastric perforation (HCC)  Abdominal Pain Contained Gastric Perforation Hx Gastric Sleeve 2015 Incidentally noted on MRI abdomen -follow-up CT confirmed this finding with appearance worrisome for possible contained perforation given proximity to previous suture line from gastric bypass procedure - UGI 8/1 c/w Frances Rodgers leak and contained perforation of the gastric fundus 7/31 had large LUQ gas and fluid collection measuring 10.1 cm lateral to the stomach concerning for contained perforation, leak, and or abscess s/p placement of 12 french drainage catheter into LUQ abscess Culture with klebsiella, lactobacillus, mixed anaerobic flora Now on unasyn, fluconazole  General Surgery coordinating care with the GI team - continue strict NPO status with TNA support for EGD 8/7 to investigate internal anatomy -will likely eventually require conversion to [place]to [place] Roux-en-Y bypass   Acute metabolic encephalopathy of unclear etiology resolved   Acute transaminitis US revealed no obstructive gallstones and no suggestion of acute cholecystitis  CT abdomen 7/25 without acute findings  viral hepatitis panel negative  possible shock liver versus drug-induced liver injury vs CBD stricture or  infection per GI AMA and ASMA negative  CMV IgM negative  MRCP 7/21 revealed Frances Rodgers mild intrahepatic bile duct dilatation with Frances Rodgers focal area of narrowing involving the proximal common bile duct possibly representing Frances Rodgers stricture -LFTs continue to improve and are now approaching normal  GI reports that further evaluation will be required when above issues are stabilized   Acute kidney injury - resolved  Felt to be due to poor intake/dehydration -now resolved with normal creatinine/GFR   DM2 with starvation ketoacidosis CBG well-controlled at this time A1c 11.7 05/2023   Peripheral edema Hypoalbuminemia Volume overload  Venous duplex negative for DVT Follow BNP - she's net positive 7.3 L Will start daily IV lasix in AM Echo 06/08/2023 with EF 70-75%, grade 1 diastolic dysfunction Albumin 1.7 on 8/5 Repeat UA, UP/C   Chronic mild intermittent asthma Stable at present   Obesity - Body mass index is 37.16 kg/m. Status post gastric sleeve 2015     DVT prophylaxis: lovenox Code Status: full Family Communication: none Disposition:   Status is: Inpatient Remains inpatient appropriate because: continued inpatient care   Consultants:  GI Surgery IR  Procedures:  8/2 Successful placement of 12 French drainage catheter into the left upper quadrant abscess.  Antimicrobials:  Anti-infectives (From admission, onward)    Start     Dose/Rate Route Frequency Ordered Stop   06/21/23 1200  [MAR Hold]  Ampicillin-Sulbactam (UNASYN) 3 g in sodium chloride 0.9 % 100 mL IVPB        (MAR Hold since Wed 06/21/2023 at 1401.Hold Reason: Transfer to [facility] Procedural area)   3 g 200 mL/hr over 30 Minutes Intravenous Every 6 hours 06/21/23 0807  06/20/23 1045  [MAR Hold]  fluconazole (DIFLUCAN) IVPB 200 mg        (MAR Hold since Wed 06/21/2023 at 1401.Hold Reason: Transfer to [facility] Procedural area)   200 mg 100 mL/hr over 60 Minutes Intravenous Every 24 hours 06/20/23 0955     06/16/23 1730   piperacillin-tazobactam (ZOSYN) IVPB 3.375 g  Status:  Discontinued        3.375 g 12.5 mL/hr over 240 Minutes Intravenous Every 8 hours 06/16/23 1634 06/21/23 0807       Subjective: No new complaints  Objective: Vitals:   06/20/23 2022 06/21/23 0241 06/21/23 0501 06/21/23 0845  BP: 121/71  (!) 112/59 120/72  Pulse: 86  86 88  Resp: 18  18 16   Temp: 98.1 F (36.7 C)  98.3 F (36.8 C) 98.2 F (36.8 C)  TempSrc: Oral  Oral   SpO2: 100%  100% 100%  Weight:  101.3 kg    Height:        Intake/Output Summary (Last 24 hours) at 06/21/2023 1417 Last data filed at 06/21/2023 0045 Gross per 24 hour  Intake 704.99 ml  Output 70 ml  Net 634.99 ml   Filed Weights   06/17/23 0500 06/20/23 0350 06/21/23 0241  Weight: 101.3 kg 101.5 kg 101.3 kg    Examination:  General exam: Appears calm and comfortable  Respiratory system: unlabored Cardiovascular system:RRR Gastrointestinal system: diffuse mildly TTP - drain  Central nervous system: Alert and oriented. No focal neurological deficits. Extremities: bilateral 2+ LE edema   Data Reviewed: I have personally reviewed following labs and imaging studies  CBC: Recent Labs  Lab 06/15/23 0237 06/17/23 0454 06/19/23 0856  WBC 6.4 4.6 4.8  HGB 8.9* 8.4* 8.4*  HCT 29.3* 28.2* 29.6*  MCV 78.6* 84.9 86.0  PLT 251 190 179    Basic Metabolic Panel: Recent Labs  Lab 06/15/23 0237 06/17/23 0454 06/18/23 0338 06/19/23 0856 06/20/23 0835  NA 130* 133* 136 136 135  K 3.8 3.5 4.1 4.2 4.2  CL 96* 102 107 106 107  CO2 24 26 22 22  21*  GLUCOSE 192* 296* 204* 174* 221*  BUN 15 10 11 13 15   CREATININE 0.62 0.78 0.68 0.56 0.57  CALCIUM 8.3* 8.2* 8.7* 8.4* 8.5*  MG  --  1.5* 1.8 1.8 1.9  PHOS  --  2.8 2.3* 2.9 3.1    GFR: Estimated Creatinine Clearance: 99.2 mL/min (by C-G formula based on SCr of 0.57 mg/dL).  Liver Function Tests: Recent Labs  Lab 06/15/23 0237 06/17/23 0454 06/19/23 0856  AST 55* 31 35  ALT 237* 113* 73*   ALKPHOS 263* 194* 147*  BILITOT 3.7* 2.8* 1.7*  PROT 5.8* 5.8* 6.4*  ALBUMIN 2.0* 1.8* 1.9*    CBG: Recent Labs  Lab 06/20/23 1603 06/20/23 2024 06/21/23 0237 06/21/23 0742 06/21/23 1141  GLUCAP 158* 162* 134* 159* 147*     Recent Results (from the past 240 hour(s))  Aerobic/Anaerobic Culture w Gram Stain (surgical/deep wound)     Status: None   Collection Time: 06/16/23  3:15 PM   Specimen: Abscess  Result Value Ref Range Status   Specimen Description ABSCESS  Final   Special Requests Normal  Final   Gram Stain   Final    ABUNDANT WBC PRESENT, PREDOMINANTLY PMN RARE BUDDING YEAST SEEN RARE GRAM NEGATIVE RODS FEW GRAM POSITIVE COCCI IN CHAINS ABUNDANT GRAM POSITIVE RODS Performed at [facility], [address], [place], [place] [address]    Culture  Final    ABUNDANT KLEBSIELLA PNEUMONIAE ABUNDANT LACTOBACILLUS SPECIES Standardized susceptibility testing for this organism is not available. MIXED ANAEROBIC FLORA PRESENT.  CALL LAB IF FURTHER IID REQUIRED.    Report Status 06/19/2023 FINAL  Final   Organism ID, Bacteria KLEBSIELLA PNEUMONIAE  Final      Susceptibility   Klebsiella pneumoniae - MIC*    AMPICILLIN RESISTANT Resistant     CEFEPIME <=0.12 SENSITIVE Sensitive     CEFTAZIDIME <=1 SENSITIVE Sensitive     CEFTRIAXONE <=0.25 SENSITIVE Sensitive     CIPROFLOXACIN <=0.25 SENSITIVE Sensitive     GENTAMICIN <=1 SENSITIVE Sensitive     IMIPENEM <=0.25 SENSITIVE Sensitive     TRIMETH/SULFA <=20 SENSITIVE Sensitive     AMPICILLIN/SULBACTAM <=2 SENSITIVE Sensitive     PIP/TAZO <=4 SENSITIVE Sensitive     * ABUNDANT KLEBSIELLA PNEUMONIAE         Radiology Studies: No results found.      Scheduled Meds:  [MAR Hold] Chlorhexidine Gluconate Cloth  6 each Topical Daily   [MAR Hold] enoxaparin (LOVENOX) injection  40 mg Subcutaneous Q24H   [MAR Hold] furosemide  40 mg Intravenous Daily   [MAR Hold] insulin aspart  0-20 Units Subcutaneous Q4H    [MAR Hold]  morphine injection  2 mg Intravenous Q6H   [MAR Hold] pantoprazole (PROTONIX) IV  40 mg Intravenous Q12H   [MAR Hold] sodium chloride flush  10-40 mL Intracatheter Q12H   [MAR Hold] sodium chloride flush  5 mL Intracatheter Q8H   Continuous Infusions:  sodium chloride 20 mL/hr at 06/19/23 0823   [MAR Hold] ampicillin-sulbactam (UNASYN) IV 3 g (06/21/23 1239)   [MAR Hold] fluconazole (DIFLUCAN) IV 200 mg (06/21/23 0914)   [MAR Hold] methocarbamol (ROBAXIN) IV 500 mg (06/20/23 2022)   TPN ADULT (ION) 85 mL/hr at 06/20/23 1808   TPN ADULT (ION)       LOS: 13 days    Time spent: over 30 min    Lacretia Nicks, MD [facility]   To contact the attending provider between 7A-7P or the covering provider during after hours 7P-7A, please log into the web site [email or web address] and access using universal [place] password for that web site. If you do not have the password, please call the hospital operator.  06/21/2023, 2:17 PM

## 2023-06-21 NOTE — Anesthesia Procedure Notes (Signed)
Procedure Name: Intubation Date/Time: 06/21/2023 4:20 PM  Performed by: Ambrose Finland, CRNAPre-anesthesia Checklist: Patient identified, Emergency Drugs available, Suction available and Patient being monitored Patient Re-evaluated:Patient Re-evaluated prior to induction Oxygen Delivery Method: Circle System Utilized Preoxygenation: Pre-oxygenation with 100% oxygen Induction Type: IV induction and Rapid sequence Ventilation: Mask ventilation without difficulty Laryngoscope Size: 3 Grade View: Grade II Tube type: Oral Tube size: 7.0 mm Number of attempts: 1 Airway Equipment and Method: Stylet and Oral airway Placement Confirmation: ETT inserted through vocal cords under direct vision, positive ETCO2 and breath sounds checked- equal and bilateral Tube secured with: Tape Dental Injury: Teeth and Oropharynx as per pre-operative assessment

## 2023-06-21 NOTE — Anesthesia Preprocedure Evaluation (Signed)
Anesthesia Evaluation  Patient identified by MRN, date of birth, ID band Patient awake  General Assessment Comment:Hx of prior gastric sleeve at [facility] in 2015 10 cm fluid collection adjacent to staple line secondary to a contained gastric perforation - EGD w/ GI today.  Additional plans pending results of procedure - s/p IR drain into LUQ collection 8/2 >> several hundred mL purulent material aspirated, culture ABUNDANT KLEBSIELLA PNEUMONIAE  ABUNDANT LACTOBACILLUS SPECIES  - continue strict NPO, TPN - Surgical intervention is not indicated right now as the ultimate treatment will be decompressing the narrowing with EGD, +/- stenting (if this is etiology) and then after the leak heals possible conversion to a roux-en-y bypass.   Reviewed: Allergy & Precautions, H&P , NPO status , Patient's Chart, lab work & pertinent test results  Airway Mallampati: II  TM Distance: >3 FB Neck ROM: Full    Dental no notable dental hx.    Pulmonary neg pulmonary ROS   Pulmonary exam normal breath sounds clear to auscultation       Cardiovascular negative cardio ROS Normal cardiovascular exam Rhythm:Regular Rate:Normal     Neuro/Psych negative neurological ROS  negative psych ROS   GI/Hepatic negative GI ROS, Neg liver ROS,,,  Endo/Other  diabetes, Insulin Dependent    Renal/GU negative Renal ROS  negative genitourinary   Musculoskeletal negative musculoskeletal ROS (+)    Abdominal   Peds negative pediatric ROS (+)  Hematology  (+) Blood dyscrasia, anemia   Anesthesia Other Findings   Reproductive/Obstetrics negative OB ROS                             Anesthesia Physical Anesthesia Plan  ASA: 3  Anesthesia Plan: MAC   Post-op Pain Management: Minimal or no pain anticipated   Induction: Intravenous  PONV Risk Score and Plan: 2 and Propofol infusion and Treatment may vary due to age or medical  condition  Airway Management Planned: Simple Face Mask  Additional Equipment:   Intra-op Plan:   Post-operative Plan:   Informed Consent: I have reviewed the patients History and Physical, chart, labs and discussed the procedure including the risks, benefits and alternatives for the proposed anesthesia with the patient or authorized representative who has indicated his/her understanding and acceptance.     Dental advisory given  Plan Discussed with: CRNA and Surgeon  Anesthesia Plan Comments:        Anesthesia Quick Evaluation

## 2023-06-21 NOTE — Progress Notes (Signed)
Patient has been evaluated in the postprocedural recovery area over the course of the last hour. She was having progressive abdominal pain in the upper abdomen extending over into the mid abdomen and lower. This was described as 10 out of 10 pain. She has increased bowel sounds. She does not have overt rebound but does have volitional guarding throughout. As my preprocedure note had suggested she did have 10 out of 10 pain in the area of her drain site and that decreased as we palpated further down in her abdomen. With this being said obviously as I had told her and as we documented she has a increased risk for complications from our therapeutic endoscopy attempt. We have given the patient multiple doses of fentanyl. X-rays of the chest and abdomen are pending (I have looked at them quickly and I do not see overt evidence of air outside of the bowel system but I will allow our radiologist to read the stat so that we can get a better sense of things. I am going to give her another dose of fentanyl. Am going to write for Mylicon liquid to be administered in an effort of seeing if that may help with some of her likely gas discomfort. But if she continues to have significant pain/discomfort then I or the medicine service will proceed with ordering a CT abdomen/pelvis with IV contrast and with oral Gastrografin to be administered to ensure that we do not see evidence of further process outside of the lumen or the previously noted contained perforation adjacent to the stomach.  Patient agrees with this plan. She remains uncomfortable but is stable. I will talk with the patient's daughter to give her an update as well.  Corliss Parish, MD [facility] Office # [phone]

## 2023-06-22 DIAGNOSIS — E1110 Type 2 diabetes mellitus with ketoacidosis without coma: Secondary | ICD-10-CM | POA: Diagnosis not present

## 2023-06-22 DIAGNOSIS — R7989 Other specified abnormal findings of blood chemistry: Secondary | ICD-10-CM | POA: Diagnosis not present

## 2023-06-22 DIAGNOSIS — R4182 Altered mental status, unspecified: Secondary | ICD-10-CM | POA: Diagnosis not present

## 2023-06-22 DIAGNOSIS — R7401 Elevation of levels of liver transaminase levels: Secondary | ICD-10-CM | POA: Diagnosis not present

## 2023-06-22 DIAGNOSIS — G9341 Metabolic encephalopathy: Secondary | ICD-10-CM | POA: Diagnosis not present

## 2023-06-22 LAB — GLUCOSE, CAPILLARY
Glucose-Capillary: 137 mg/dL — ABNORMAL HIGH (ref 70–99)
Glucose-Capillary: 139 mg/dL — ABNORMAL HIGH (ref 70–99)
Glucose-Capillary: 146 mg/dL — ABNORMAL HIGH (ref 70–99)
Glucose-Capillary: 150 mg/dL — ABNORMAL HIGH (ref 70–99)
Glucose-Capillary: 176 mg/dL — ABNORMAL HIGH (ref 70–99)
Glucose-Capillary: 184 mg/dL — ABNORMAL HIGH (ref 70–99)

## 2023-06-22 MED ORDER — SODIUM CHLORIDE 0.9 % IV SOLN: INTRAVENOUS | Status: DC | PRN

## 2023-06-22 MED ORDER — BOOST / RESOURCE BREEZE PO LIQD CUSTOM
1.0000 | ORAL | Status: DC
  Administered 2023-06-23 – 2023-06-25 (×3): 1 via ORAL

## 2023-06-22 MED ORDER — ALBUMIN HUMAN 25 % IV SOLN
25.0000 g | Freq: Every day | INTRAVENOUS | Status: AC
  Administered 2023-06-22 – 2023-06-24 (×3): 25 g via INTRAVENOUS
  Filled 2023-06-22 (×3): qty 100

## 2023-06-22 MED ORDER — OXYCODONE HCL 5 MG PO TABS
5.0000 mg | ORAL_TABLET | ORAL | Status: DC | PRN
  Administered 2023-06-22 – 2023-06-24 (×6): 5 mg via ORAL
  Filled 2023-06-22 (×7): qty 1

## 2023-06-22 MED ORDER — PROSOURCE PLUS PO LIQD
30.0000 mL | Freq: Every day | ORAL | Status: DC
  Administered 2023-06-23: 30 mL via ORAL
  Filled 2023-06-22 (×4): qty 30

## 2023-06-22 MED ORDER — TRAVASOL 10 % IV SOLN
INTRAVENOUS | Status: AC
  Filled 2023-06-22: qty 1100

## 2023-06-22 NOTE — Progress Notes (Signed)
[facility] night cross cover note:   I was notified by RN that the patient experienced an unwitnessed fall without any report of associated LOC, hitting of the head, or change in mental status. Patient without acute arthralgias or myalgias as a result of this fall.  She is on DVT prophylactic dose of Lovenox, but otherwise no additional blood thinners, including no aspirin.  In the setting of the above, there does not appear to be an indication for imaging of the head at this time.  I have ordered fall precautions.   Additionally, the patient is requesting a p.o. trial. RN contacted on-call [facility], Dr. Barron Alvine, who recommended continuing to hold off on food overnight and conveyed that she would be re-evaluated by the day GI team to re-assess timing of potential PO trial.      Newton Pigg, DO Hospitalist

## 2023-06-22 NOTE — Progress Notes (Signed)
Daily Progress Note  DOA: 06/07/2023 Hospital Day: 16 Chief Complaint: elevated liver tests and gastric perforation   ASSESSMENT & PLAN   Brief Narrative:  Frances Rodgers is a 52 y.o. year old female with a history of    History of gastric sleeve in 2015. Currently has a large fluid collection next to stomach on CT scan . UGI series confirms leak / contained perforation of gastric fundus ,presumably due to suture dehiscence from prior sleeve gastrectomy  -LUQ drain placement by IR on 8/2.  -Getting TNA and tolerated clears started by Surgery today -Had EGD yesterday, stent wasn't feasible. Further management per Surgery    Elevated liver chemistries, ? 2/2 to ischemic colitis vrs DILI, vrs. CBD stricture or possible infection.  -Liver chemistries continue to improve. Following resolution of acute issues will further evaluate MRCP findings of mild intrahepatic bile duct dilatation with focal area of narrowing of the proximal common bile duct just beyond the confluence of the bile ducts.    Subjective   No abdominal pain or nausea today. Happy to have clear liquid which she is tolerating   Objective   New Events:  EGD 8/7 - No gross lesions in the entire esophagus. - Z-line irregular, 39 cm from the incisors. - 1 cm hiatal hernia. - A sleeve gastrectomy was found. 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 - Erythematous mucosa in the stomach. Biopsied. - No gross lesions in the duodenal bulb, in the first portion of the duodenum and in the second portion of the duodenum.   Recent Labs    06/22/23 0451  WBC 6.2  HGB 8.4*  HCT 28.8*  PLT 144*   BMET Recent Labs    06/20/23 0835 06/22/23 0451  NA 135 136  K 4.2 4.1  CL 107 106  CO2 21* 25  GLUCOSE 221* 160*  BUN 15 12  CREATININE 0.57 0.65  CALCIUM 8.5* 8.5*    LFT Recent Labs    06/22/23 0451  PROT 6.0*  ALBUMIN 1.9*  AST 26  ALT 40  ALKPHOS 107  BILITOT 1.2   PT/INR No results for input(s): "LABPROT", "INR" in the last 72 hours.   Imaging:  DG CHEST PORT 1 VIEW CLINICAL DATA:  Abdominal pain following EGD.  EXAM: PORTABLE CHEST 1 VIEW  COMPARISON:  06/07/2023  FINDINGS: Left upper quadrant pigtail drainage catheter noted.  Right PICC line tip: SVC.  Low lung volumes are present, causing crowding of the pulmonary vasculature. No appreciable pneumothorax or pneumomediastinum. Given the low lung volumes, the lungs appear clear. No blunting of the costophrenic angles. Heart size thought to be within normal limits given the technique, rightward rotation, and low lung volumes.  IMPRESSION: 1. Low lung volumes, without acute findings. 2. Left upper quadrant pigtail drainage catheter.  Electronically Signed   By: Gaylyn Rong M.D.   On: 06/21/2023 19:03 DG Abd Portable 2V CLINICAL DATA:  Follow up status post drainage of abscess  EXAM: PORTABLE ABDOMEN - 2 VIEW  COMPARISON:  CT 06/14/2023.  FINDINGS: Mildly dilated loops small and large bowel. GI contrast retained in the colon. There is no evidence of free air. Left upper quadrant pigtail drainage catheter. Epigastric region bowel parenchymal sutures.  IMPRESSION: Small and large bowel mild dilatation likely an ileus. Retained oral contrast in the colon. No free air identified.  Electronically Signed   By: Layla Maw M.D.   On: 06/21/2023 19:01     Scheduled inpatient medications:   Chlorhexidine Gluconate Cloth  6 each Topical Daily   enoxaparin (LOVENOX) injection  40 mg Subcutaneous Q24H   furosemide  40 mg Intravenous Daily   insulin aspart  0-20 Units Subcutaneous Q4H    morphine injection  2 mg Intravenous Q6H    morphine injection  2 mg Intravenous Once   pantoprazole (PROTONIX) IV  40 mg Intravenous Q12H   sodium chloride flush   10-40 mL Intracatheter Q12H   sodium chloride flush  5 mL Intracatheter Q8H   Continuous inpatient infusions:   ampicillin-sulbactam (UNASYN) IV 3 g (06/22/23 6578)   fluconazole (DIFLUCAN) IV 200 mg (06/22/23 0941)   methocarbamol (ROBAXIN) IV 500 mg (06/20/23 2022)   TPN ADULT (ION) 85 mL/hr at 06/21/23 2018   TPN ADULT (ION)     PRN inpatient medications: albuterol, methocarbamol (ROBAXIN) IV, morphine injection, [DISCONTINUED] ondansetron **OR** ondansetron (ZOFRAN) IV, mouth rinse, sodium chloride flush  Vital signs in last 24 hours: Temp:  [97.2 F (36.2 C)-98.5 F (36.9 C)] 98.5 F (36.9 C) (08/08 0601) Pulse Rate:  [94-104] 98 (08/08 0601) Resp:  [16-24] 17 (08/08 0601) BP: (121-141)/(70-89) 133/70 (08/08 0601) SpO2:  [98 %-100 %] 100 % (08/08 0601) Last BM Date : 06/15/23  Intake/Output Summary (Last 24 hours) at 06/22/2023 0954 Last data filed at 06/22/2023 0612 Gross per 24 hour  Intake 967.42 ml  Output 1065 ml  Net -97.58 ml    Intake/Output from previous day: 08/07 0701 - 08/08 0700 In: 967.4 [I.V.:862.4; IV Piggyback:100] Out: 1065 [Urine:1000; Drains:60; Blood:5] Intake/Output this shift: No intake/output data recorded.   Physical Exam:  General: Alert female in NAD Heart:  Regular rate and rhythm.  Pulmonary: Normal respiratory effort Abdomen: Soft, nondistended, normal bowel sounds.Scant amount of purulent drainage in bulb Extremities: No lower extremity edema  Neurologic: Alert and oriented Psych: Pleasant. Cooperative. Insight appears normal.    Principal Problem:   AMS (altered mental status) Active Problems:   Asthma   DKA (diabetic ketoacidosis) (HCC)   Acute metabolic encephalopathy   Nausea & vomiting   Transaminitis   Gastric perforation (HCC)     LOS: 14 days   Willette Cluster ,NP 06/22/2023, 9:54 AM

## 2023-06-22 NOTE — Progress Notes (Signed)
PHARMACY - TOTAL PARENTERAL NUTRITION CONSULT NOTE   Indication:  gastric leak  Patient Measurements: Height: 5\' 5"  (165.1 cm) Weight: 101.3 kg (223 lb 5.2 oz) IBW/kg (Calculated) : 57   Body mass index is 37.16 kg/m. Adj Body Weight: 75 kg Usual Weight: ~97.3 kg  Assessment:  52 yo F with PMH of T2DM, asthma, obesity s/p gastric sleeve in 2015, and GERD. Pt presented on 7/24 with AMS and was found to have metabolic acidosis and respiratory alkalosis with uncontrolled BG. LFTs were elevated on admission and GI was consulted. LFTs initially improved, then peaked on 06/11/23. On 8/1, UGI showed a gastric leak likely due to suture dehiscence of previous gastric sleeve performed in 2015. Surgery was consulted on 8/1 and patient was made NPO. A 10mL fluid collection was also noted adjacent to the stable line and the gastric perforation. IR has been consulted for drain placement. Pharmacy was consulted for TPN initiation on 8/2 due to gastric leak to provide nutrition.  Per patient, usual weight is 225lbs and has not lost weight recently to her knowledge. She was eating about 50% of meals last week, mainly because she did not like the food here. Normally she eats 3-4 meals a day at home including fruits, vegetables, hamburgers, [place] etc/   Glucose / Insulin: T2DM, A1c 11.7% (05/27/23), 78u within TPN, 14u SSI/24h  CBG: 139-166 -PTA used insulin glargine 22 units at bedtime and insulin lispro 6 units TID Electrolytes: K 4.1, CoCa 10.2, Phos 2.9, Mg 1.8, HCO3: 25, Cl: 106  others WNL  Renal: SCr and BUN WNL  Hepatic: alk phos/ ALT/Tbili WNL,  AST wnl, albumin 1.9 Intake / Output; MIVF: UOP 0.9mL/kg/hr, drains 60ml, LBM 8/3, note lasix 40mg  IV daily  GI Imaging: 7/24 KUB: cholelithiasis, hepatic steatosis  7/25 CT: no acute findings  7/30 MRI: LUQ gas and fluid collection 7/31 CT: large LUQ gas and fluid collection concerning for contained perforation/leak or abscess  8/1 UGI: gastric leak  likely due to suture dehiscence from prior sleeve gastrectomy GI Surgeries / Procedures: 8/2 Drain to LUQ abscess  8/5 EGD   Central access: PICC placed 06/16/23 TPN start date: 06/16/23  Nutritional Goals: Goal TPN rate is 85 mL/hr (provides 114 g of protein and 2149 kcals per day)  RD Assessment: Estimated Needs Total Energy Estimated Needs: 2100-2300 Total Protein Estimated Needs: 110-130 grams Total Fluid Estimated Needs: >2.0 L  Current Nutrition:  TPN  8/8: CLD starting   Plan:  Continue TPN at goal 85 ml/hr, meeting 100% estimated needs, providing 2103kcal and 110g AA.  Electrolytes in TPN: Na 150 mEq/L, K 40 mEq/L, Ca 2 mEq/L, increase Mg 14 mEq/L (Ca + Mag = 61mEq/L), increase Phos 20 mmol/L. Cl:Ac 1:2  With ongoing diuresis will supplement outside the TPN if needed.  Add standard MVI and trace elements to TPN Continue Resistant q4h SSI.   Continue 78 units of insulin regular in TPN  Monitor TPN labs daily until stable at goal then on Mon/Thurs. Recheck in AM with diuresis.  Clear liquid diet started 8/8, f/u ability to advance for TPN wean.   Estill Batten, PharmD, BCCCP  Clinical Pharmacist  Please check AMION for all [facility] phone numbers After 10:00 PM, call Main Pharmacy [phone]

## 2023-06-22 NOTE — Progress Notes (Signed)
[facility] Progress Note  1 Day Post-Op  Subjective: Had some increased pain after EGD yesterday that has since resolved. Denies new complaints this morning.    Objective: Vital signs in last 24 hours: Temp:  [97.2 F (36.2 C)-98.5 F (36.9 C)] 98.5 F (36.9 C) (08/08 0601) Pulse Rate:  [94-104] 98 (08/08 0601) Resp:  [16-24] 17 (08/08 0601) BP: (121-141)/(70-89) 133/70 (08/08 0601) SpO2:  [98 %-100 %] 100 % (08/08 0601) Last BM Date : 06/15/23  Intake/Output from previous day: 08/07 0701 - 08/08 0700 In: 967.4 [I.V.:862.4; IV Piggyback:100] Out: 1065 [Urine:1000; Drains:60; Blood:5] Intake/Output this shift: No intake/output data recorded.  PE: Gen:  Alert Abd: Soft, tenderness on mostly left side of the abdomen around her drain site.  JP w/ SS output w/ fibrinous exudate Psych: A&Ox4   Lab Results:  Recent Labs    06/22/23 0451  WBC 6.2  HGB 8.4*  HCT 28.8*  PLT 144*   BMET Recent Labs    06/20/23 0835 06/22/23 0451  NA 135 136  K 4.2 4.1  CL 107 106  CO2 21* 25  GLUCOSE 221* 160*  BUN 15 12  CREATININE 0.57 0.65  CALCIUM 8.5* 8.5*   PT/INR No results for input(s): "LABPROT", "INR" in the last 72 hours.  CMP     Component Value Date/Time   NA 136 06/22/2023 0451   NA 136 09/30/2022 1457   NA 137 12/24/2013 1044   K 4.1 06/22/2023 0451   K 3.8 12/24/2013 1044   CL 106 06/22/2023 0451   CL 103 12/24/2013 1044   CO2 25 06/22/2023 0451   CO2 26 12/24/2013 1044   GLUCOSE 160 (H) 06/22/2023 0451   GLUCOSE 116 (H) 12/24/2013 1044   BUN 12 06/22/2023 0451   BUN 12 09/30/2022 1457   BUN 8 12/24/2013 1044   CREATININE 0.65 06/22/2023 0451   CREATININE 1.06 12/24/2013 1044   CALCIUM 8.5 (L) 06/22/2023 0451   CALCIUM 9.0 12/24/2013 1044   PROT 6.0 (L) 06/22/2023 0451   PROT 7.0 09/30/2022 1457   PROT 7.4 12/24/2013 1044   ALBUMIN 1.9 (L) 06/22/2023 0451   ALBUMIN 3.9 09/30/2022 1457   ALBUMIN 3.4 12/24/2013 1044   AST 26  06/22/2023 0451   AST 17 12/24/2013 1044   ALT 40 06/22/2023 0451   ALT 25 12/24/2013 1044   ALKPHOS 107 06/22/2023 0451   ALKPHOS 76 12/24/2013 1044   BILITOT 1.2 06/22/2023 0451   BILITOT 0.3 09/30/2022 1457   BILITOT 0.5 12/24/2013 1044   GFRNONAA >60 06/22/2023 0451   GFRNONAA >60 12/24/2013 1044   GFRAA 92 07/08/2020 1614   GFRAA >60 12/24/2013 1044   Lipase     Component Value Date/Time   LIPASE 64 (H) 06/11/2023 0036   LIPASE 92 12/24/2013 1044       Studies/Results: DG CHEST PORT 1 VIEW  Result Date: 06/21/2023 CLINICAL DATA:  Abdominal pain following EGD. EXAM: PORTABLE CHEST 1 VIEW COMPARISON:  06/07/2023 FINDINGS: Left upper quadrant pigtail drainage catheter noted. Right PICC line tip: SVC. Low lung volumes are present, causing crowding of the pulmonary vasculature. No appreciable pneumothorax or pneumomediastinum. Given the low lung volumes, the lungs appear clear. No blunting of the costophrenic angles. Heart size thought to be within normal limits given the technique, rightward rotation, and low lung volumes. IMPRESSION: 1. Low lung volumes, without acute findings. 2. Left upper quadrant pigtail drainage catheter. Electronically Signed   By: Annitta Needs.D.  On: 06/21/2023 19:03   DG Abd Portable 2V  Result Date: 06/21/2023 CLINICAL DATA:  Follow up status post drainage of abscess EXAM: PORTABLE ABDOMEN - 2 VIEW COMPARISON:  CT 06/14/2023. FINDINGS: Mildly dilated loops small and large bowel. GI contrast retained in the colon. There is no evidence of free air. Left upper quadrant pigtail drainage catheter. Epigastric region bowel parenchymal sutures. IMPRESSION: Small and large bowel mild dilatation likely an ileus. Retained oral contrast in the colon. No free air identified. Electronically Signed   By: Layla Maw M.D.   On: 06/21/2023 19:01    Anti-infectives: Anti-infectives (From admission, onward)    Start     Dose/Rate Route Frequency Ordered Stop    06/21/23 1200  Ampicillin-Sulbactam (UNASYN) 3 g in sodium chloride 0.9 % 100 mL IVPB        3 g 200 mL/hr over 30 Minutes Intravenous Every 6 hours 06/21/23 0807     06/20/23 1045  fluconazole (DIFLUCAN) IVPB 200 mg        200 mg 100 mL/hr over 60 Minutes Intravenous Every 24 hours 06/20/23 0955     06/16/23 1730  piperacillin-tazobactam (ZOSYN) IVPB 3.375 g  Status:  Discontinued        3.375 g 12.5 mL/hr over 240 Minutes Intravenous Every 8 hours 06/16/23 1634 06/21/23 0807        Assessment/Plan Hx of prior gastric sleeve at [facility] in 2015 10 cm fluid collection adjacent to staple line secondary to a contained gastric perforation - EGD 8/7 showed narrowing of the proximal stomach w/ an associated band and an additional connection to the distal stomach  - s/p IR drain into LUQ collection 8/2 >> several hundred mL purulent material aspirated --> now SS - Will start CLD today - cont unasyn and diflucan    FEN: CLD, TPN VTE: LMWH ID: [email or web address]    - per [facility] -  Asthma Recent dx of IDDM, DKA, resolved AKI, resolved Transaminitis, improving  I reviewed Consultant GI notes, hospitalist notes, last 24 h vitals and pain scores, last 48 h intake and output, last 24 h labs and trends, and last 24 h imaging results.    LOS: 14 days    Moise Boring, MD [facility] 06/22/2023, 9:05 AM Please see Amion for pager number during day hours 7:00am-4:30pm

## 2023-06-22 NOTE — Progress Notes (Signed)
PROGRESS NOTE    Frances Rodgers  NFA:[id number] DOB: 1971-07-14 DOA: 06/07/2023 PCP: Tollie Eth, NP  Chief Complaint  Patient presents with   Altered Mental Status    Brief Narrative:   52 year old with Frances Rodgers history of mild intermittent asthma and recently diagnosed DM who was brought to the ER by [name] family member with altered mental status.  Workup revealed elevated LFTs.   Significant Events: 8/2 percutaneous IR drain and L UQ collection yielding several 100 mL of purulent material 8/2 PICC line placed    Assessment & Plan:   Principal Problem:   AMS (altered mental status) Active Problems:   DKA (diabetic ketoacidosis) (HCC)   Acute metabolic encephalopathy   Asthma   Nausea & vomiting   Transaminitis   Gastric perforation (HCC)  Abdominal Pain Contained Gastric Perforation Hx Gastric Sleeve 2015 Incidentally noted on MRI abdomen -follow-up CT confirmed this finding with appearance worrisome for possible contained perforation given proximity to previous suture line from gastric bypass procedure - UGI 8/1 c/w Frances Rodgers leak and contained perforation of the gastric fundus 7/31 had large LUQ gas and fluid collection measuring 10.1 cm lateral to the stomach concerning for contained perforation, leak, and or abscess s/p placement of 12 french drainage catheter into LUQ abscess Culture with klebsiella, lactobacillus, mixed anaerobic flora Now on unasyn, fluconazole  General Surgery coordinating care with the GI team - TNA support -> starting clears today EGD 8/7 -> GI recommending continuing TPN, no further inpatient GI workup at this time -will likely eventually require conversion to Nik Gorrell Roux-en-Y bypass   Acute metabolic encephalopathy of unclear etiology resolved   Acute transaminitis US revealed no obstructive gallstones and no suggestion of acute cholecystitis  CT abdomen 7/25 without acute findings  viral hepatitis panel negative  possible shock liver versus drug-induced  liver injury vs CBD stricture or infection per GI AMA and ASMA negative  CMV IgM negative  MRCP 7/21 revealed Frances Rodgers mild intrahepatic bile duct dilatation with Frances Rodgers focal area of narrowing involving the proximal common bile duct possibly representing Frances Rodgers stricture -LFTs continue to improve and are now approaching normal  GI reports that further evaluation will be required when above issues are stabilized   Acute kidney injury - resolved  Felt to be due to poor intake/dehydration -now resolved with normal creatinine/GFR   DM2 with starvation ketoacidosis CBG well-controlled at this time A1c 11.7 05/2023   Peripheral edema Hypoalbuminemia Volume overload  Venous duplex negative for DVT Follow BNP - she's net positive 7.3 L IV lasix Echo 06/08/2023 with EF 70-75%, grade 1 diastolic dysfunction Albumin 1.7 on 8/5 Repeat UA, UP/C   Chronic mild intermittent asthma Stable at present   Obesity - Body mass index is 37.16 kg/m. Status post gastric sleeve 2015     DVT prophylaxis: lovenox Code Status: full Family Communication: none Disposition:   Status is: Inpatient Remains inpatient appropriate because: continued inpatient care   Consultants:  GI Surgery IR  Procedures:  8/2 Successful placement of 12 French drainage catheter into the left upper quadrant abscess.  Antimicrobials:  Anti-infectives (From admission, onward)    Start     Dose/Rate Route Frequency Ordered Stop   06/21/23 1200  Ampicillin-Sulbactam (UNASYN) 3 g in sodium chloride 0.9 % 100 mL IVPB        3 g 200 mL/hr over 30 Minutes Intravenous Every 6 hours 06/21/23 0807     06/20/23 1045  fluconazole (DIFLUCAN) IVPB 200 mg  200 mg 100 mL/hr over 60 Minutes Intravenous Every 24 hours 06/20/23 0955     06/16/23 1730  piperacillin-tazobactam (ZOSYN) IVPB 3.375 g  Status:  Discontinued        3.375 g 12.5 mL/hr over 240 Minutes Intravenous Every 8 hours 06/16/23 1634 06/21/23 0807        Subjective: No new complaints  Objective: Vitals:   06/21/23 1900 06/22/23 0245 06/22/23 0601 06/22/23 1001  BP: 121/74 (!) 141/76 133/70 (!) 143/82  Pulse: 94 99 98 98  Resp: 18 20 17 18   Temp: (!) 97.2 F (36.2 C) 98 F (36.7 C) 98.5 F (36.9 C) 98.1 F (36.7 C)  TempSrc:   Oral Oral  SpO2: 100% 100% 100% 100%  Weight:      Height:        Intake/Output Summary (Last 24 hours) at 06/22/2023 1441 Last data filed at 06/22/2023 1400 Gross per 24 hour  Intake 1572.42 ml  Output 1135 ml  Net 437.42 ml   Filed Weights   06/17/23 0500 06/20/23 0350 06/21/23 0241  Weight: 101.3 kg 101.5 kg 101.3 kg    Examination:  General: No acute distress. Cardiovascular: RRR Lungs: unlabored Abdomen: TTP diffusely  Neurological: Alert and oriented 3. Moves all extremities 4 with equal strength. Cranial nerves II through XII grossly intact. Extremities: 2+ LE edema    Data Reviewed: I have personally reviewed following labs and imaging studies  CBC: Recent Labs  Lab 06/17/23 0454 06/19/23 0856 06/22/23 0451  WBC 4.6 4.8 6.2  NEUTROABS  --   --  4.6  HGB 8.4* 8.4* 8.4*  HCT 28.2* 29.6* 28.8*  MCV 84.9 86.0 87.5  PLT 190 179 144*    Basic Metabolic Panel: Recent Labs  Lab 06/17/23 0454 06/18/23 0338 06/19/23 0856 06/20/23 0835 06/22/23 0451  NA 133* 136 136 135 136  K 3.5 4.1 4.2 4.2 4.1  CL 102 107 106 107 106  CO2 26 22 22  21* 25  GLUCOSE 296* 204* 174* 221* 160*  BUN 10 11 13 15 12   CREATININE 0.78 0.68 0.56 0.57 0.65  CALCIUM 8.2* 8.7* 8.4* 8.5* 8.5*  MG 1.5* 1.8 1.8 1.9 1.8  PHOS 2.8 2.3* 2.9 3.1 2.9    GFR: Estimated Creatinine Clearance: 99.2 mL/min (by C-G formula based on SCr of 0.65 mg/dL).  Liver Function Tests: Recent Labs  Lab 06/17/23 0454 06/19/23 0856 06/22/23 0451  AST 31 35 26  ALT 113* 73* 40  ALKPHOS 194* 147* 107  BILITOT 2.8* 1.7* 1.2  PROT 5.8* 6.4* 6.0*  ALBUMIN 1.8* 1.9* 1.9*    CBG: Recent Labs  Lab  06/21/23 1805 06/21/23 1958 06/22/23 0319 06/22/23 0743 06/22/23 1147  GLUCAP 151* 166* 150* 139* 184*     Recent Results (from the past 240 hour(s))  Aerobic/Anaerobic Culture w Gram Stain (surgical/deep wound)     Status: None   Collection Time: 06/16/23  3:15 PM   Specimen: Abscess  Result Value Ref Range Status   Specimen Description ABSCESS  Final   Special Requests Normal  Final   Gram Stain   Final    ABUNDANT WBC PRESENT, PREDOMINANTLY PMN RARE BUDDING YEAST SEEN RARE GRAM NEGATIVE RODS FEW GRAM POSITIVE COCCI IN CHAINS ABUNDANT GRAM POSITIVE RODS Performed at [facility], [address] [place], [place] [address]    Culture   Final    ABUNDANT KLEBSIELLA PNEUMONIAE ABUNDANT LACTOBACILLUS SPECIES Standardized susceptibility testing for this organism is not available. MIXED ANAEROBIC FLORA  PRESENT.  CALL LAB IF FURTHER IID REQUIRED.    Report Status 06/19/2023 FINAL  Final   Organism ID, Bacteria KLEBSIELLA PNEUMONIAE  Final      Susceptibility   Klebsiella pneumoniae - MIC*    AMPICILLIN RESISTANT Resistant     CEFEPIME <=0.12 SENSITIVE Sensitive     CEFTAZIDIME <=1 SENSITIVE Sensitive     CEFTRIAXONE <=0.25 SENSITIVE Sensitive     CIPROFLOXACIN <=0.25 SENSITIVE Sensitive     GENTAMICIN <=1 SENSITIVE Sensitive     IMIPENEM <=0.25 SENSITIVE Sensitive     TRIMETH/SULFA <=20 SENSITIVE Sensitive     AMPICILLIN/SULBACTAM <=2 SENSITIVE Sensitive     PIP/TAZO <=4 SENSITIVE Sensitive     * ABUNDANT KLEBSIELLA PNEUMONIAE         Radiology Studies: DG CHEST PORT 1 VIEW  Result Date: 06/21/2023 CLINICAL DATA:  Abdominal pain following EGD. EXAM: PORTABLE CHEST 1 VIEW COMPARISON:  06/07/2023 FINDINGS: Left upper quadrant pigtail drainage catheter noted. Right PICC line tip: SVC. Low lung volumes are present, causing crowding of the pulmonary vasculature. No appreciable pneumothorax or pneumomediastinum. Given the low lung volumes, the lungs appear clear. No  blunting of the costophrenic angles. Heart size thought to be within normal limits given the technique, rightward rotation, and low lung volumes. IMPRESSION: 1. Low lung volumes, without acute findings. 2. Left upper quadrant pigtail drainage catheter. Electronically Signed   By: Gaylyn Rong M.D.   On: 06/21/2023 19:03   DG Abd Portable 2V  Result Date: 06/21/2023 CLINICAL DATA:  Follow up status post drainage of abscess EXAM: PORTABLE ABDOMEN - 2 VIEW COMPARISON:  CT 06/14/2023. FINDINGS: Mildly dilated loops small and large bowel. GI contrast retained in the colon. There is no evidence of free air. Left upper quadrant pigtail drainage catheter. Epigastric region bowel parenchymal sutures. IMPRESSION: Small and large bowel mild dilatation likely an ileus. Retained oral contrast in the colon. No free air identified. Electronically Signed   By: Layla Maw M.D.   On: 06/21/2023 19:01        Scheduled Meds:  Chlorhexidine Gluconate Cloth  6 each Topical Daily   enoxaparin (LOVENOX) injection  40 mg Subcutaneous Q24H   furosemide  40 mg Intravenous Daily   insulin aspart  0-20 Units Subcutaneous Q4H    morphine injection  2 mg Intravenous Q6H    morphine injection  2 mg Intravenous Once   pantoprazole (PROTONIX) IV  40 mg Intravenous Q12H   sodium chloride flush  10-40 mL Intracatheter Q12H   sodium chloride flush  5 mL Intracatheter Q8H   Continuous Infusions:  ampicillin-sulbactam (UNASYN) IV 3 g (06/22/23 1314)   fluconazole (DIFLUCAN) IV 200 mg (06/22/23 0941)   methocarbamol (ROBAXIN) IV 500 mg (06/20/23 2022)   TPN ADULT (ION) 85 mL/hr at 06/21/23 2018   TPN ADULT (ION)       LOS: 14 days    Time spent: over 30 min    Lacretia Nicks, MD [facility]   To contact the attending provider between 7A-7P or the covering provider during after hours 7P-7A, please log into the web site [email or web address] and access using universal [place] password for that web site.  If you do not have the password, please call the hospital operator.  06/22/2023, 2:41 PM

## 2023-06-22 NOTE — Anesthesia Postprocedure Evaluation (Signed)
Anesthesia Post Note  Patient: BUDDY CRUNKLETON  Procedure(s) Performed: ESOPHAGOGASTRODUODENOSCOPY (EGD) WITH PROPOFOL BIOPSY     Patient location during evaluation: PACU Anesthesia Type: General Level of consciousness: awake and alert Pain management: pain level controlled Vital Signs Assessment: post-procedure vital signs reviewed and stable Respiratory status: spontaneous breathing, nonlabored ventilation and respiratory function stable Cardiovascular status: blood pressure returned to baseline and stable Postop Assessment: no apparent nausea or vomiting Anesthetic complications: no   No notable events documented.  Last Vitals:  Vitals:   06/22/23 0245 06/22/23 0601  BP: (!) 141/76 133/70  Pulse: 99 98  Resp: 20 17  Temp: 36.7 C 36.9 C  SpO2: 100% 100%    Last Pain:  Vitals:   06/22/23 0601  TempSrc: Oral  PainSc:    Pain Goal: Patients Stated Pain Goal: 1 (06/20/23 1802)                 Beryle Lathe

## 2023-06-22 NOTE — Progress Notes (Signed)
   06/22/23 0300  What Happened  Was fall witnessed? No  Was patient injured? No  Patient found other (Comment) (In bed)  Found by No one-pt stated they fell  Stated prior activity bathroom-unassisted  Provider Notification  Provider Name/Title J. Howerter  Date Provider Notified 06/22/23  Time Provider Notified 0230  Method of Notification Page  Notification Reason Fall  Provider response See new orders  Date of Provider Response 06/22/23  Time of Provider Response 0235  Follow Up  Family notified No - patient refusal  Additional tests No  Progress note created (see row info) Yes  Adult Fall Risk Assessment  Risk Factor Category (scoring not indicated) Not Applicable  Age 52  Fall History: Fall within 6 months prior to admission 5  Elimination; Bowel and/or Urine Incontinence 0  Elimination; Bowel and/or Urine Urgency/Frequency 0  Medications: includes PCA/Opiates, Anti-convulsants, Anti-hypertensives, Diuretics, Hypnotics, Laxatives, Sedatives, and Psychotropics 5  Patient Care Equipment 2  Mobility-Assistance 0  Mobility-Gait 0  Mobility-Sensory Deficit 0  Altered awareness of immediate physical environment 0  Impulsiveness 0  Lack of understanding of one's physical/cognitive limitations 0  Total Score 12  Patient Fall Risk Level Moderate fall risk  Adult Fall Risk Interventions  Required Bundle Interventions *See Row Information* Moderate fall risk - low and moderate requirements implemented  Fall intervention(s) refused/Patient educated regarding refusal Bed alarm  Neurological  Neuro (WDL) WDL  Musculoskeletal  Musculoskeletal (WDL) X  Assistive Device None  Generalized Weakness Yes  Weight Bearing Restrictions No

## 2023-06-23 DIAGNOSIS — R400 Somnolence: Secondary | ICD-10-CM | POA: Diagnosis not present

## 2023-06-23 LAB — URINALYSIS, ROUTINE W REFLEX MICROSCOPIC
Bilirubin Urine: NEGATIVE
Glucose, UA: NEGATIVE mg/dL
Ketones, ur: NEGATIVE mg/dL
Leukocytes,Ua: NEGATIVE
Nitrite: NEGATIVE
Protein, ur: NEGATIVE mg/dL
Specific Gravity, Urine: 1.015 (ref 1.005–1.030)
pH: 8 (ref 5.0–8.0)

## 2023-06-23 LAB — GLUCOSE, CAPILLARY
Glucose-Capillary: 135 mg/dL — ABNORMAL HIGH (ref 70–99)
Glucose-Capillary: 136 mg/dL — ABNORMAL HIGH (ref 70–99)
Glucose-Capillary: 176 mg/dL — ABNORMAL HIGH (ref 70–99)
Glucose-Capillary: 216 mg/dL — ABNORMAL HIGH (ref 70–99)
Glucose-Capillary: 175 mg/dL — ABNORMAL HIGH (ref 70–99)

## 2023-06-23 MED ORDER — FUROSEMIDE 10 MG/ML IJ SOLN
40.0000 mg | Freq: Two times a day (BID) | INTRAMUSCULAR | Status: DC
  Administered 2023-06-24 – 2023-06-25 (×3): 40 mg via INTRAVENOUS
  Filled 2023-06-23 (×4): qty 4

## 2023-06-23 MED ORDER — TRAVASOL 10 % IV SOLN
INTRAVENOUS | Status: AC
  Filled 2023-06-23: qty 517.6

## 2023-06-23 MED ORDER — DOCUSATE SODIUM 100 MG PO CAPS
100.0000 mg | ORAL_CAPSULE | Freq: Two times a day (BID) | ORAL | Status: DC
  Administered 2023-06-23 – 2023-06-26 (×6): 100 mg via ORAL
  Filled 2023-06-23 (×7): qty 1

## 2023-06-23 MED ORDER — MAGNESIUM SULFATE 2 GM/50ML IV SOLN
2.0000 g | Freq: Once | INTRAVENOUS | Status: AC
  Administered 2023-06-23: 2 g via INTRAVENOUS
  Filled 2023-06-23: qty 50

## 2023-06-23 NOTE — Progress Notes (Signed)
Patient c/o constipation. Howerter,MD notified via page. Order received.

## 2023-06-23 NOTE — Progress Notes (Signed)
Referring Physician(s): Dr Meridee Score  Supervising Physician: Richarda Overlie  Patient Status:  [facility] - In-pt  Chief Complaint:  Gastric perforation with fluid collection adjacent to the stomach S/p drain placement in IR 06/16/23  Subjective:  Up in bed Clear liq diet Good spirits; no complaints Drain intact   Allergies: Aspirin, Ketorolac, and Ketorolac tromethamine  Medications: Prior to Admission medications   Medication Sig Start Date End Date Taking? Authorizing Provider  albuterol (VENTOLIN HFA) 108 (90 Base) MCG/ACT inhaler Inhale 2 puffs into the lungs every 6 (six) hours as needed for wheezing or shortness of breath.   Yes [provider]  Cetirizine HCl 10 MG CAPS Take 10 mg by mouth daily as needed (allergy). 06/02/03  Yes [provider]  FLOVENT DISKUS 50 MCG/ACT AEPB Take 1 Inhalation by mouth every 12 (twelve) hours as needed (SOB).   Yes [provider]  insulin glargine (LANTUS) 100 UNIT/ML Solostar Pen Inject 22 Units into the skin at bedtime. May substitute as needed per insurance. Patient taking differently: Inject 22 Units into the skin at bedtime. 06/01/23 08/30/23 Yes Darlin Priestly, MD  insulin lispro (HUMALOG) 100 UNIT/ML KwikPen Inject 6 Units into the skin 3 (three) times daily with meals. Only take if eating a meal AND Blood Glucose (BG) is 80 or higher. Patient taking differently: Inject 6 Units into the skin 3 (three) times daily with meals. 06/01/23 08/30/23 Yes Darlin Priestly, MD  JUNEL FE 24 1-20 MG-MCG(24) tablet TAKE 1 TABLET BY MOUTH DAILY Patient taking differently: Take 1 tablet by mouth at bedtime. 02/14/23  Yes Hildred Laser, MD  montelukast (SINGULAIR) 10 MG tablet Take 10 mg by mouth at bedtime.   Yes [provider]  Multiple Vitamin (MULTI VITAMIN DAILY PO) Take 1 tablet by mouth daily.   Yes [provider]  pantoprazole (PROTONIX) 40 MG tablet Take 40 mg by mouth daily as needed (acid reflex). 04/12/19  Yes  [provider]  QVAR REDIHALER 40 MCG/ACT inhaler Inhale 1 puff into the lungs 2 (two) times daily. 12/09/22  Yes [provider]  Blood Glucose Monitoring Suppl DEVI 1 each by Does not apply route 3 (three) times daily. May dispense any manufacturer covered by patient's insurance. 06/01/23   Darlin Priestly, MD  Glucose Blood (BLOOD GLUCOSE TEST STRIPS) STRP 1 each by Does not apply route 3 (three) times daily. Use as directed to check blood sugar. May dispense any manufacturer covered by patient's insurance and fits patient's device. 06/01/23   Darlin Priestly, MD  Insulin Pen Needle (PEN NEEDLES) 31G X 5 MM MISC 1 each by Does not apply route 3 (three) times daily. May dispense any manufacturer covered by patient's insurance. 06/01/23   Darlin Priestly, MD  Lancet Device MISC 1 each by Does not apply route 3 (three) times daily. May dispense any manufacturer covered by patient's insurance. 06/01/23   Darlin Priestly, MD  Lancets MISC 1 each by Does not apply route 3 (three) times daily. Use as directed to check blood sugar. May dispense any manufacturer covered by patient's insurance and fits patient's device. 06/01/23   Darlin Priestly, MD     Vital Signs: BP 133/76 (BP Location: Left Arm)   Pulse (!) 104   Temp 98.9 F (37.2 C) (Oral)   Resp 18   Ht 5\' 5"  (1.651 m)   Wt 223 lb 5.2 oz (101.3 kg)   LMP 06/08/2023   SpO2 100%   BMI 37.16 kg/m   Physical  Exam Vitals reviewed.  Skin:    General: Skin is warm.     Comments: Site of drain is c/d/I NT; no bleeding No sign of infection Flushes and aspirates easily OP milky purulent 15-20 cc daily  Report Status 06/19/2023 FINAL Organism ID, Bacteria KLEBSIELLA PNEUMONIAE     Neurological:     Mental Status: She is alert.     Imaging: DG CHEST PORT 1 VIEW  Result Date: 06/21/2023 CLINICAL DATA:  Abdominal pain following EGD. EXAM: PORTABLE CHEST 1 VIEW COMPARISON:  06/07/2023 FINDINGS: Left upper quadrant pigtail drainage catheter noted.  Right PICC line tip: SVC. Low lung volumes are present, causing crowding of the pulmonary vasculature. No appreciable pneumothorax or pneumomediastinum. Given the low lung volumes, the lungs appear clear. No blunting of the costophrenic angles. Heart size thought to be within normal limits given the technique, rightward rotation, and low lung volumes. IMPRESSION: 1. Low lung volumes, without acute findings. 2. Left upper quadrant pigtail drainage catheter. Electronically Signed   By: Gaylyn Rong M.D.   On: 06/21/2023 19:03   DG Abd Portable 2V  Result Date: 06/21/2023 CLINICAL DATA:  Follow up status post drainage of abscess EXAM: PORTABLE ABDOMEN - 2 VIEW COMPARISON:  CT 06/14/2023. FINDINGS: Mildly dilated loops small and large bowel. GI contrast retained in the colon. There is no evidence of free air. Left upper quadrant pigtail drainage catheter. Epigastric region bowel parenchymal sutures. IMPRESSION: Small and large bowel mild dilatation likely an ileus. Retained oral contrast in the colon. No free air identified. Electronically Signed   By: Layla Maw M.D.   On: 06/21/2023 19:01    Labs:  CBC: Recent Labs    06/15/23 0237 06/17/23 0454 06/19/23 0856 06/22/23 0451  WBC 6.4 4.6 4.8 6.2  HGB 8.9* 8.4* 8.4* 8.4*  HCT 29.3* 28.2* 29.6* 28.8*  PLT 251 190 179 144*    COAGS: Recent Labs    06/13/23 0020 06/14/23 0405 06/15/23 0237 06/16/23 1136  INR 2.2* 1.5* 1.6* 1.4*    BMP: Recent Labs    06/19/23 0856 06/20/23 0835 06/22/23 0451 06/23/23 0328  NA 136 135 136 132*  K 4.2 4.2 4.1 3.9  CL 106 107 106 100  CO2 22 21* 25 23  GLUCOSE 174* 221* 160* 149*  BUN 13 15 12 13   CALCIUM 8.4* 8.5* 8.5* 8.6*  CREATININE 0.56 0.57 0.65 0.56  GFRNONAA >60 >60 >60 >60    LIVER FUNCTION TESTS: Recent Labs    06/15/23 0237 06/17/23 0454 06/19/23 0856 06/22/23 0451  BILITOT 3.7* 2.8* 1.7* 1.2  AST 55* 31 35 26  ALT 237* 113* 73* 40  ALKPHOS 263* 194* 147* 107   PROT 5.8* 5.8* 6.4* 6.0*  ALBUMIN 2.0* 1.8* 1.9* 1.9*   Drain Location: LUQ Size: Fr size: 12 Fr Date of placement: 06/16/23  Currently to: Drain collection device: suction bulb 24 hour output:  Output by Drain (mL) 06/21/23 0701 - 06/21/23 1900 06/21/23 1901 - 06/22/23 0700 06/22/23 0701 - 06/22/23 1900 06/22/23 1901 - 06/23/23 0700 06/23/23 0701 - 06/23/23 0905  Closed System Drain 1 Left LUQ Bulb (JP) 10 Fr. 20 40 90 50     Interval imaging/drain manipulation:  none  Current examination: Flushes/aspirates easily.  Insertion site unremarkable. Suture and stat lock in place. Dressed appropriately.  OP milky purulent 15-20 cc daily  Plan: Continue TID flushes with 5 cc NS. Record output Q shift. Dressing changes QD or PRN if soiled.  Call IR APP or  on call IR MD if difficulty flushing or sudden change in drain output.  Repeat imaging/possible drain injection once output < 10 mL/QD (excluding flush material). Consideration for drain removal if output is < 10 mL/QD (excluding flush material), pending discussion with the providing surgical service.  Discharge planning: Please contact IR APP or on call IR MD prior to patient d/c to ensure appropriate follow up plans are in place. Typically patient will follow up with IR clinic 10-14 days post d/c for repeat imaging/possible drain injection. IR scheduler will contact patient with date/time of appointment. Patient will need to flush drain QD with 5 cc NS, record output QD, dressing changes every 2-3 days or earlier if soiled.   IR will continue to follow - please call with questions or concerns. Assessment and Plan:  IR will place orders for OP follow up when time for DC  Electronically Signed: Robet Leu, PA-C 06/23/2023, 9:05 AM   I spent a total of 15 Minutes at the the patient's bedside AND on the patient's hospital floor or unit, greater than 50% of which was counseling/coordinating care for LUQ abscess drain

## 2023-06-23 NOTE — Plan of Care (Signed)
  Problem: Health Behavior/Discharge Planning: Goal: Ability to identify and utilize available resources and services will improve Outcome: Progressing Goal: Ability to manage health-related needs will improve Outcome: Progressing   Problem: Metabolic: Goal: Ability to maintain appropriate glucose levels will improve Outcome: Progressing   Problem: Nutritional: Goal: Maintenance of adequate nutrition will improve Outcome: Progressing Goal: Maintenance of adequate weight for body size and type will improve Outcome: Progressing   Problem: Respiratory: Goal: Will regain and/or maintain adequate ventilation Outcome: Progressing

## 2023-06-23 NOTE — Progress Notes (Signed)
[facility] night cross cover note:   I was notified by RN the patient's request for medication for constipation.  I subsequently placed order for scheduled Colace.     Newton Pigg, DO Hospitalist

## 2023-06-23 NOTE — Progress Notes (Signed)
PROGRESS NOTE    Frances Rodgers  ZOX:[id number] DOB: 20-Sep-1971 DOA: 06/07/2023 PCP: Tollie Eth, NP  Chief Complaint  Patient presents with   Altered Mental Status    Brief Narrative:   52 year old with Frances Rodgers history of mild intermittent asthma and recently diagnosed DM who was brought to the ER by [name] family member with altered mental status.  Workup revealed elevated LFTs.   Significant Events: 8/2 percutaneous IR drain and L UQ collection yielding several 100 mL of purulent material 8/2 PICC line placed    Assessment & Plan:   Principal Problem:   AMS (altered mental status) Active Problems:   DKA (diabetic ketoacidosis) (HCC)   Acute metabolic encephalopathy   Asthma   Nausea & vomiting   Transaminitis   Gastric perforation (HCC)  Abdominal Pain Contained Gastric Perforation Hx Gastric Sleeve 2015 Incidentally noted on MRI abdomen -follow-up CT confirmed this finding with appearance worrisome for possible contained perforation given proximity to previous suture line from gastric bypass procedure - UGI 8/1 c/w Frances Rodgers leak and contained perforation of the gastric fundus 7/31 had large LUQ gas and fluid collection measuring 10.1 cm lateral to the stomach concerning for contained perforation, leak, and or abscess s/p placement of 12 french drainage catheter into LUQ abscess Culture with klebsiella, lactobacillus, mixed anaerobic flora Now on unasyn, fluconazole  General Surgery coordinating care with the GI team - half TPN today, if tolerating diet, plan to discontinue TPN over the weekened and consider d/c home Monday and follow up with Dr. Dossie Der and IR as an outpatient - trying regular diet today EGD 8/7 -> GI recommending continuing TPN, no further inpatient GI workup at this time -will likely eventually require conversion to [name] Roux-en-Y bypass   Acute metabolic encephalopathy of unclear etiology resolved   Acute transaminitis US revealed no obstructive gallstones and  no suggestion of acute cholecystitis  CT abdomen 7/25 without acute findings  viral hepatitis panel negative  possible shock liver versus drug-induced liver injury vs CBD stricture or infection per GI AMA and ASMA negative  CMV IgM negative  MRCP 7/21 revealed Frances Rodgers mild intrahepatic bile duct dilatation with [name] focal area of narrowing involving the proximal common bile duct 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 -LFTs continue to improve and are now approaching normal  GI reports that further evaluation will be required when above issues are stabilized   Acute kidney injury - resolved  Felt to be due to poor intake/dehydration -now resolved with normal creatinine/GFR   DM2 with starvation ketoacidosis CBG well-controlled at this time A1c 11.7 05/2023   Peripheral edema Hypoalbuminemia Volume overload  Venous duplex negative for DVT Follow BNP - she's net positive 11.3 L  IV lasix Echo 06/08/2023 with EF 70-75%, grade 1 diastolic dysfunction Albumin 1.7 on 8/5 Repeat UA, UP/C (pending collection)   Chronic mild intermittent asthma Stable at present   Obesity - Body mass index is 37.16 kg/m. Status post gastric sleeve 2015     DVT prophylaxis: lovenox Code Status: full Family Communication: none Disposition:   Status is: Inpatient Remains inpatient appropriate because: continued inpatient care   Consultants:  GI Surgery IR  Procedures:  8/2 Successful placement of 12 French drainage catheter into the left upper quadrant abscess.  Antimicrobials:  Anti-infectives (From admission, onward)    Start     Dose/Rate Route Frequency Ordered Stop   06/21/23 1200  Ampicillin-Sulbactam (UNASYN) 3 g in sodium chloride 0.9 % 100 mL IVPB  3 g 200 mL/hr over 30 Minutes Intravenous Every 6 hours 06/21/23 0807     06/20/23 1045  fluconazole (DIFLUCAN) IVPB 200 mg        200 mg 100 mL/hr over 60 Minutes Intravenous Every 24 hours 06/20/23 0955     06/16/23 1730   piperacillin-tazobactam (ZOSYN) IVPB 3.375 g  Status:  Discontinued        3.375 g 12.5 mL/hr over 240 Minutes Intravenous Every 8 hours 06/16/23 1634 06/21/23 0807       Subjective: Asking about diet  Objective: Vitals:   06/22/23 1545 06/22/23 2022 06/23/23 0457 06/23/23 0935  BP: (!) 152/85 117/67 133/76 127/70  Pulse: 96 90 (!) 104 (!) 105  Resp: 20 16 18 19   Temp: 98 F (36.7 C) 98.2 F (36.8 C) 98.9 F (37.2 C) 99 F (37.2 C)  TempSrc: Oral Oral Oral   SpO2: 100% 100% 100% 100%  Weight:      Height:        Intake/Output Summary (Last 24 hours) at 06/23/2023 1620 Last data filed at 06/23/2023 1300 Gross per 24 hour  Intake 3463.03 ml  Output 90 ml  Net 3373.03 ml   Filed Weights   06/17/23 0500 06/20/23 0350 06/21/23 0241  Weight: 101.3 kg 101.5 kg 101.3 kg    Examination:  General: No acute distress. Cardiovascular: RRR Lungs: unlabored Abdomen: Soft, nontender, nondistended - less TTP today Neurological: Alert and oriented 3. Moves all extremities 4 with equal strength. Cranial nerves II through XII grossly intact. Extremities: bilateral LE edema     Data Reviewed: I have personally reviewed following labs and imaging studies  CBC: Recent Labs  Lab 06/17/23 0454 06/19/23 0856 06/22/23 0451  WBC 4.6 4.8 6.2  NEUTROABS  --   --  4.6  HGB 8.4* 8.4* 8.4*  HCT 28.2* 29.6* 28.8*  MCV 84.9 86.0 87.5  PLT 190 179 144*    Basic Metabolic Panel: Recent Labs  Lab 06/18/23 0338 06/19/23 0856 06/20/23 0835 06/22/23 0451 06/23/23 0328  NA 136 136 135 136 132*  K 4.1 4.2 4.2 4.1 3.9  CL 107 106 107 106 100  CO2 22 22 21* 25 23  GLUCOSE 204* 174* 221* 160* 149*  BUN 11 13 15 12 13   CREATININE 0.68 0.56 0.57 0.65 0.56  CALCIUM 8.7* 8.4* 8.5* 8.5* 8.6*  MG 1.8 1.8 1.9 1.8 1.7  PHOS 2.3* 2.9 3.1 2.9 3.0    GFR: Estimated Creatinine Clearance: 99.2 mL/min (by C-G formula based on SCr of 0.56 mg/dL).  Liver Function Tests: Recent Labs  Lab  06/17/23 0454 06/19/23 0856 06/22/23 0451  AST 31 35 26  ALT 113* 73* 40  ALKPHOS 194* 147* 107  BILITOT 2.8* 1.7* 1.2  PROT 5.8* 6.4* 6.0*  ALBUMIN 1.8* 1.9* 1.9*    CBG: Recent Labs  Lab 06/22/23 2354 06/23/23 0330 06/23/23 0752 06/23/23 1123 06/23/23 1618  GLUCAP 137* 135* 136* 176* 216*     Recent Results (from the past 240 hour(s))  Aerobic/Anaerobic Culture w Gram Stain (surgical/deep wound)     Status: None   Collection Time: 06/16/23  3:15 PM   Specimen: Abscess  Result Value Ref Range Status   Specimen Description ABSCESS  Final   Special Requests Normal  Final   Gram Stain   Final    ABUNDANT WBC PRESENT, PREDOMINANTLY PMN RARE BUDDING YEAST SEEN RARE GRAM NEGATIVE RODS FEW GRAM POSITIVE COCCI IN CHAINS ABUNDANT GRAM POSITIVE RODS Performed at [facility], [address], [place], [place] [address]    Culture   Final    ABUNDANT KLEBSIELLA PNEUMONIAE ABUNDANT LACTOBACILLUS SPECIES Standardized susceptibility testing for this organism is not available. MIXED ANAEROBIC FLORA PRESENT.  CALL LAB IF FURTHER IID REQUIRED.    Report Status 06/19/2023 FINAL  Final   Organism ID, Bacteria KLEBSIELLA PNEUMONIAE  Final      Susceptibility   Klebsiella pneumoniae - MIC*    AMPICILLIN RESISTANT Resistant     CEFEPIME <=0.12 SENSITIVE Sensitive     CEFTAZIDIME <=1 SENSITIVE Sensitive     CEFTRIAXONE <=0.25 SENSITIVE Sensitive     CIPROFLOXACIN <=0.25 SENSITIVE Sensitive     GENTAMICIN <=1 SENSITIVE Sensitive     IMIPENEM <=0.25 SENSITIVE Sensitive     TRIMETH/SULFA <=20 SENSITIVE Sensitive     AMPICILLIN/SULBACTAM <=2 SENSITIVE Sensitive     PIP/TAZO <=4 SENSITIVE Sensitive     * ABUNDANT KLEBSIELLA PNEUMONIAE         Radiology Studies: DG CHEST PORT 1 VIEW  Result Date: 06/21/2023 CLINICAL DATA:  Abdominal pain following EGD. EXAM: PORTABLE CHEST 1 VIEW COMPARISON:  06/07/2023 FINDINGS: Left upper quadrant pigtail drainage catheter noted. Right  PICC line tip: SVC. Low lung volumes are present, causing crowding of the pulmonary vasculature. No appreciable pneumothorax or pneumomediastinum. Given the low lung volumes, the lungs appear clear. No blunting of the costophrenic angles. Heart size thought to be within normal limits given the technique, rightward rotation, and low lung volumes. IMPRESSION: 1. Low lung volumes, without acute findings. 2. Left upper quadrant pigtail drainage catheter. Electronically Signed   By: Gaylyn Rong M.D.   On: 06/21/2023 19:03   DG Abd Portable 2V  Result Date: 06/21/2023 CLINICAL DATA:  Follow up status post drainage of abscess EXAM: PORTABLE ABDOMEN - 2 VIEW COMPARISON:  CT 06/14/2023. FINDINGS: Mildly dilated loops small and large bowel. GI contrast retained in the colon. There is no evidence of free air. Left upper quadrant pigtail drainage catheter. Epigastric region bowel parenchymal sutures. IMPRESSION: Small and large bowel mild dilatation likely an ileus. Retained oral contrast in the colon. No free air identified. Electronically Signed   By: Layla Maw M.D.   On: 06/21/2023 19:01        Scheduled Meds:  (feeding supplement) PROSource Plus  30 mL Oral Daily   Chlorhexidine Gluconate Cloth  6 each Topical Daily   docusate sodium  100 mg Oral BID   enoxaparin (LOVENOX) injection  40 mg Subcutaneous Q24H   feeding supplement  1 Container Oral Q24H   furosemide  40 mg Intravenous Daily   insulin aspart  0-20 Units Subcutaneous Q4H    morphine injection  2 mg Intravenous Q6H    morphine injection  2 mg Intravenous Once   pantoprazole (PROTONIX) IV  40 mg Intravenous Q12H   sodium chloride flush  10-40 mL Intracatheter Q12H   sodium chloride flush  5 mL Intracatheter Q8H   Continuous Infusions:  sodium chloride 10 mL/hr at 06/22/23 2059   albumin human 25 g (06/23/23 1238)   ampicillin-sulbactam (UNASYN) IV 3 g (06/23/23 1134)   fluconazole (DIFLUCAN) IV 200 mg (06/23/23 1133)    methocarbamol (ROBAXIN) IV 500 mg (06/20/23 2022)   TPN ADULT (ION) 85 mL/hr at 06/22/23 1819   TPN ADULT (ION)       LOS: 15 days    Time spent: over 30 min    Lacretia Nicks, MD [facility]   To contact the attending  provider between 7A-7P or the covering provider during after hours 7P-7A, please log into the web site [email or web address] and access using universal Roland password for that web site. If you do not have the password, please call the hospital operator.  06/23/2023, 4:20 PM

## 2023-06-23 NOTE — Progress Notes (Signed)
PHARMACY - TOTAL PARENTERAL NUTRITION CONSULT NOTE   Indication:  gastric leak  Patient Measurements: Height: 5\' 5"  (165.1 cm) Weight: 101.3 kg (223 lb 5.2 oz) IBW/kg (Calculated) : 57   Body mass index is 37.16 kg/m. Adj Body Weight: 75 kg Usual Weight: ~97.3 kg  Assessment:  52 yo F with PMH of T2DM, asthma, obesity s/p gastric sleeve in 2015, and GERD. Pt presented on 7/24 with AMS and was found to have metabolic acidosis and respiratory alkalosis with uncontrolled BG. LFTs were elevated on admission and GI was consulted. LFTs initially improved, then peaked on 06/11/23. On 8/1, UGI showed a gastric leak likely due to suture dehiscence of previous gastric sleeve performed in 2015. Surgery was consulted on 8/1 and patient was made NPO. A 10mL fluid collection was also noted adjacent to the stable line and the gastric perforation. IR has been consulted for drain placement. Pharmacy was consulted for TPN initiation on 8/2 due to gastric leak to provide nutrition.  Per patient, usual weight is 225lbs and has not lost weight recently to her knowledge. She was eating about 50% of meals last week, mainly because she did not like the food here. Normally she eats 3-4 meals a day at home including fruits, vegetables, hamburgers, [place] etc/   Glucose / Insulin: T2DM, A1c 11.7% (05/27/23), 78u within TPN, 20u SSI/24h  CBG: 135-184 -PTA used insulin glargine 22 units at bedtime and insulin lispro 6 units TID Electrolytes: Na: 132, K 3.9, CoCa 10.3, Phos 3.0 (increased 8/8), Mg 1.7 (increased 8/8), HCO3: 23, Cl: 100  others WNL  Renal: SCr and BUN WNL  Hepatic: 8/8: alk phos/ ALT/Tbili WNL,  AST wnl, albumin 1.9 Intake / Output; MIVF: UOP not documented in past 24h, drains , LBM 8/8, note lasix 40mg  IV daily  GI Imaging: 7/24 KUB: cholelithiasis, hepatic steatosis  7/25 CT: no acute findings  7/30 MRI: LUQ gas and fluid collection 7/31 CT: large LUQ gas and fluid collection concerning for  contained perforation/leak or abscess  8/1 UGI: gastric leak likely due to suture dehiscence from prior sleeve gastrectomy GI Surgeries / Procedures: 8/2 Drain to LUQ abscess  8/5 EGD   Central access: PICC placed 06/16/23 TPN start date: 06/16/23  Nutritional Goals: Goal TPN rate is 85 mL/hr (provides 114 g of protein and 2149 kcals per day)  RD Assessment: Estimated Needs Total Energy Estimated Needs: 2100-2300 Total Protein Estimated Needs: 110-130 grams Total Fluid Estimated Needs: >2.0 L  Current Nutrition:  TPN  8/8: CLD starting  8/9: regular diet   Plan:  Wean TPN to half goal 40 ml/hr, meeting 50% estimated needs, providing 989kcal and 51g AA.  Electrolytes in TPN: Na 150 mEq/L, K 40 mEq/L, remove Ca, reduce Mg 10 mEq/L, reduce Phos 10 mmol/L. Cl:Ac 1:2  Mag and phos required to be reduction based on volume, will likely need outside TPN replacement on 8/10. Current formula is max'ed on electrolytes for volume.  Give 2g of mag sulfate this AM outside of TPN.  Add standard MVI and trace elements to TPN Continue Resistant q4h SSI.   Reduce to 30 units of insulin regular in TPN (approx. 60% reduction) given weaning, patient was taking 40 PTA.  Monitor TPN labs daily until stable at goal then on Mon/Thurs. Will recheck in AM with ongoing diuresis and wean.  F/u toleration of regular diet for further TPN wean.   Estill Batten, PharmD, BCCCP  Clinical Pharmacist  Please check AMION for all [facility] phone  numbers After 10:00 PM, call Main Pharmacy [phone]

## 2023-06-23 NOTE — Progress Notes (Addendum)
[facility] Progress Note  2 Days Post-Op  Subjective: Feels well today.  Still draining JP 3-4 times per day w/ tan fluid output  Objective: Vital signs in last 24 hours: Temp:  [98 F (36.7 C)-98.9 F (37.2 C)] 98.9 F (37.2 C) (08/09 0457) Pulse Rate:  [90-104] 104 (08/09 0457) Resp:  [16-20] 18 (08/09 0457) BP: (117-152)/(67-85) 133/76 (08/09 0457) SpO2:  [100 %] 100 % (08/09 0457) Last BM Date : 06/22/23  Intake/Output from previous day: 08/08 0701 - 08/09 0700 In: 3583 [P.O.:840; I.V.:1961.6; IV Piggyback:766.4] Out: 140 [Drains:140] Intake/Output this shift: Total I/O In: 240 [P.O.:240] Out: -   PE: Gen:  Alert Abd: Soft, tenderness on mostly left side of the abdomen around her drain site.  JP w/ tan output w/ fibrinous exudate Psych: A&Ox4   Lab Results:  Recent Labs    06/22/23 0451  WBC 6.2  HGB 8.4*  HCT 28.8*  PLT 144*   BMET Recent Labs    06/22/23 0451 06/23/23 0328  NA 136 132*  K 4.1 3.9  CL 106 100  CO2 25 23  GLUCOSE 160* 149*  BUN 12 13  CREATININE 0.65 0.56  CALCIUM 8.5* 8.6*   PT/INR No results for input(s): "LABPROT", "INR" in the last 72 hours.  CMP     Component Value Date/Time   NA 132 (L) 06/23/2023 0328   NA 136 09/30/2022 1457   NA 137 12/24/2013 1044   K 3.9 06/23/2023 0328   K 3.8 12/24/2013 1044   CL 100 06/23/2023 0328   CL 103 12/24/2013 1044   CO2 23 06/23/2023 0328   CO2 26 12/24/2013 1044   GLUCOSE 149 (H) 06/23/2023 0328   GLUCOSE 116 (H) 12/24/2013 1044   BUN 13 06/23/2023 0328   BUN 12 09/30/2022 1457   BUN 8 12/24/2013 1044   CREATININE 0.56 06/23/2023 0328   CREATININE 1.06 12/24/2013 1044   CALCIUM 8.6 (L) 06/23/2023 0328   CALCIUM 9.0 12/24/2013 1044   PROT 6.0 (L) 06/22/2023 0451   PROT 7.0 09/30/2022 1457   PROT 7.4 12/24/2013 1044   ALBUMIN 1.9 (L) 06/22/2023 0451   ALBUMIN 3.9 09/30/2022 1457   ALBUMIN 3.4 12/24/2013 1044   AST 26 06/22/2023 0451   AST 17 12/24/2013 1044    ALT 40 06/22/2023 0451   ALT 25 12/24/2013 1044   ALKPHOS 107 06/22/2023 0451   ALKPHOS 76 12/24/2013 1044   BILITOT 1.2 06/22/2023 0451   BILITOT 0.3 09/30/2022 1457   BILITOT 0.5 12/24/2013 1044   GFRNONAA >60 06/23/2023 0328   GFRNONAA >60 12/24/2013 1044   GFRAA 92 07/08/2020 1614   GFRAA >60 12/24/2013 1044   Lipase     Component Value Date/Time   LIPASE 64 (H) 06/11/2023 0036   LIPASE 92 12/24/2013 1044       Studies/Results: DG CHEST PORT 1 VIEW  Result Date: 06/21/2023 CLINICAL DATA:  Abdominal pain following EGD. EXAM: PORTABLE CHEST 1 VIEW COMPARISON:  06/07/2023 FINDINGS: Left upper quadrant pigtail drainage catheter noted. Right PICC line tip: SVC. Low lung volumes are present, causing crowding of the pulmonary vasculature. No appreciable pneumothorax or pneumomediastinum. Given the low lung volumes, the lungs appear clear. No blunting of the costophrenic angles. Heart size thought to be within normal limits given the technique, rightward rotation, and low lung volumes. IMPRESSION: 1. Low lung volumes, without acute findings. 2. Left upper quadrant pigtail drainage catheter. Electronically Signed   By: Annitta Needs.D.  On: 06/21/2023 19:03   DG Abd Portable 2V  Result Date: 06/21/2023 CLINICAL DATA:  Follow up status post drainage of abscess EXAM: PORTABLE ABDOMEN - 2 VIEW COMPARISON:  CT 06/14/2023. FINDINGS: Mildly dilated loops small and large bowel. GI contrast retained in the colon. There is no evidence of free air. Left upper quadrant pigtail drainage catheter. Epigastric region bowel parenchymal sutures. IMPRESSION: Small and large bowel mild dilatation likely an ileus. Retained oral contrast in the colon. No free air identified. Electronically Signed   By: Layla Maw M.D.   On: 06/21/2023 19:01    Anti-infectives: Anti-infectives (From admission, onward)    Start     Dose/Rate Route Frequency Ordered Stop   06/21/23 1200  Ampicillin-Sulbactam  (UNASYN) 3 g in sodium chloride 0.9 % 100 mL IVPB        3 g 200 mL/hr over 30 Minutes Intravenous Every 6 hours 06/21/23 0807     06/20/23 1045  fluconazole (DIFLUCAN) IVPB 200 mg        200 mg 100 mL/hr over 60 Minutes Intravenous Every 24 hours 06/20/23 0955     06/16/23 1730  piperacillin-tazobactam (ZOSYN) IVPB 3.375 g  Status:  Discontinued        3.375 g 12.5 mL/hr over 240 Minutes Intravenous Every 8 hours 06/16/23 1634 06/21/23 0807        Assessment/Plan Hx of prior gastric sleeve at [facility] with Dr. Anselm Lis in 2015 - not interested in traveling to [facility] for bariatric care so will follow up here in [facility] 10 cm fluid collection adjacent to staple line secondary to a contained gastric perforation - EGD 8/7 showed narrowing of the proximal stomach w/ an associated band and an additional connection to the distal stomach  - s/p IR drain into LUQ collection 8/2 >> several hundred mL purulent material aspirated --> now SS - Try regular diet today (was tolerating this prior to admission) - Half TPN today.  If tolerating diet, discontinue TPN this weekend and consider discharge home on Monday with follow up with Dr. Dossie Der and interventional radiology.  May also need additional endoscopic evaluations or treatments with Dr. Meridee Score but I can help coordinate this as an outpatient. - Hopefully with drainage and time the sleeve will heal - cont unasyn and diflucan    FEN: CLD, TPN VTE: LMWH ID: unasyn/diflucan    - per TRH -  Asthma Recent dx of IDDM, DKA, resolved AKI, resolved Transaminitis, improving  I reviewed Consultant GI notes, hospitalist notes, last 24 h vitals and pain scores, last 48 h intake and output, last 24 h labs and trends, and last 24 h imaging results.    LOS: 15 days    Quentin Ore, MD [facility] 06/23/2023, 9:30 AM Please see Amion for pager number during day hours 7:00am-4:30pm

## 2023-06-24 ENCOUNTER — Encounter: Payer: Self-pay | Admitting: Gastroenterology

## 2023-06-24 DIAGNOSIS — R400 Somnolence: Secondary | ICD-10-CM | POA: Diagnosis not present

## 2023-06-24 LAB — GLUCOSE, CAPILLARY
Glucose-Capillary: 141 mg/dL — ABNORMAL HIGH (ref 70–99)
Glucose-Capillary: 158 mg/dL — ABNORMAL HIGH (ref 70–99)
Glucose-Capillary: 159 mg/dL — ABNORMAL HIGH (ref 70–99)
Glucose-Capillary: 165 mg/dL — ABNORMAL HIGH (ref 70–99)
Glucose-Capillary: 173 mg/dL — ABNORMAL HIGH (ref 70–99)
Glucose-Capillary: 179 mg/dL — ABNORMAL HIGH (ref 70–99)

## 2023-06-24 MED ORDER — OXYCODONE HCL 5 MG PO TABS
5.0000 mg | ORAL_TABLET | ORAL | Status: DC | PRN
  Administered 2023-06-24 – 2023-06-26 (×7): 5 mg via ORAL
  Filled 2023-06-24 (×7): qty 1

## 2023-06-24 MED ORDER — INSULIN ASPART 100 UNIT/ML IJ SOLN: 0.0000 [IU] | Freq: Three times a day (TID) | INTRAMUSCULAR | Status: DC

## 2023-06-24 MED ORDER — OXYCODONE HCL 5 MG PO TABS
10.0000 mg | ORAL_TABLET | ORAL | Status: DC | PRN
  Administered 2023-06-24: 10 mg via ORAL
  Filled 2023-06-24: qty 2

## 2023-06-24 MED ORDER — INSULIN ASPART 100 UNIT/ML IJ SOLN: 0.0000 [IU] | Freq: Every day | INTRAMUSCULAR | Status: DC

## 2023-06-24 MED ORDER — METHOCARBAMOL 500 MG PO TABS
500.0000 mg | ORAL_TABLET | Freq: Three times a day (TID) | ORAL | Status: DC
  Administered 2023-06-24 – 2023-06-26 (×7): 500 mg via ORAL
  Filled 2023-06-24 (×7): qty 1

## 2023-06-24 MED ORDER — ACETAMINOPHEN 325 MG PO TABS
650.0000 mg | ORAL_TABLET | Freq: Four times a day (QID) | ORAL | Status: DC
  Administered 2023-06-24 – 2023-06-26 (×9): 650 mg via ORAL
  Filled 2023-06-24 (×9): qty 2

## 2023-06-24 MED ORDER — INSULIN ASPART 100 UNIT/ML IJ SOLN
0.0000 [IU] | Freq: Three times a day (TID) | INTRAMUSCULAR | Status: DC
  Administered 2023-06-25: 3 [IU] via SUBCUTANEOUS
  Administered 2023-06-25: 4 [IU] via SUBCUTANEOUS
  Administered 2023-06-25: 3 [IU] via SUBCUTANEOUS
  Administered 2023-06-26: 4 [IU] via SUBCUTANEOUS

## 2023-06-24 MED ORDER — GABAPENTIN 300 MG PO CAPS
300.0000 mg | ORAL_CAPSULE | Freq: Three times a day (TID) | ORAL | Status: DC
  Administered 2023-06-24 – 2023-06-26 (×7): 300 mg via ORAL
  Filled 2023-06-24 (×7): qty 1

## 2023-06-24 MED ORDER — POLYETHYLENE GLYCOL 3350 17 G PO PACK
17.0000 g | PACK | Freq: Every day | ORAL | Status: DC
  Administered 2023-06-24 – 2023-06-26 (×3): 17 g via ORAL
  Filled 2023-06-24 (×3): qty 1

## 2023-06-24 NOTE — Progress Notes (Addendum)
[facility] Progress Note  3 Days Post-Op  Subjective: Feels well today.  Does note epigastric and left upper quadrant pain not necessarily exacerbated by eating.  Still draining JP 3-4 times per day w/ tan fluid output  Objective: Vital signs in last 24 hours: Temp:  [98.4 F (36.9 C)-99 F (37.2 C)] 98.4 F (36.9 C) (08/10 0842) Pulse Rate:  [97-105] 105 (08/10 0842) Resp:  [16-19] 18 (08/10 0842) BP: (121-138)/(67-81) 130/74 (08/10 0842) SpO2:  [99 %-100 %] 100 % (08/10 0842) Last BM Date : 06/23/23  Intake/Output from previous day: 08/09 0701 - 08/10 0700 In: 1010.4 [P.O.:480; I.V.:258.4; IV Piggyback:262] Out: 80 [Drains:80] Intake/Output this shift: No intake/output data recorded.  PE: Gen:  Alert Abd: Soft, tenderness on mostly left side of the abdomen around her drain site.  JP w/ tan purulent output w/ fibrinous exudate Psych: A&Ox4   Lab Results:  Recent Labs    06/22/23 0451  WBC 6.2  HGB 8.4*  HCT 28.8*  PLT 144*   BMET Recent Labs    06/23/23 0328 06/24/23 0428  NA 132* 132*  K 3.9 4.0  CL 100 101  CO2 23 24  GLUCOSE 149* 141*  BUN 13 11  CREATININE 0.56 0.57  CALCIUM 8.6* 8.5*   PT/INR No results for input(s): "LABPROT", "INR" in the last 72 hours.  CMP     Component Value Date/Time   NA 132 (L) 06/24/2023 0428   NA 136 09/30/2022 1457   NA 137 12/24/2013 1044   K 4.0 06/24/2023 0428   K 3.8 12/24/2013 1044   CL 101 06/24/2023 0428   CL 103 12/24/2013 1044   CO2 24 06/24/2023 0428   CO2 26 12/24/2013 1044   GLUCOSE 141 (H) 06/24/2023 0428   GLUCOSE 116 (H) 12/24/2013 1044   BUN 11 06/24/2023 0428   BUN 12 09/30/2022 1457   BUN 8 12/24/2013 1044   CREATININE 0.57 06/24/2023 0428   CREATININE 1.06 12/24/2013 1044   CALCIUM 8.5 (L) 06/24/2023 0428   CALCIUM 9.0 12/24/2013 1044   PROT 6.0 (L) 06/22/2023 0451   PROT 7.0 09/30/2022 1457   PROT 7.4 12/24/2013 1044   ALBUMIN 1.9 (L) 06/22/2023 0451   ALBUMIN 3.9  09/30/2022 1457   ALBUMIN 3.4 12/24/2013 1044   AST 26 06/22/2023 0451   AST 17 12/24/2013 1044   ALT 40 06/22/2023 0451   ALT 25 12/24/2013 1044   ALKPHOS 107 06/22/2023 0451   ALKPHOS 76 12/24/2013 1044   BILITOT 1.2 06/22/2023 0451   BILITOT 0.3 09/30/2022 1457   BILITOT 0.5 12/24/2013 1044   GFRNONAA >60 06/24/2023 0428   GFRNONAA >60 12/24/2013 1044   GFRAA 92 07/08/2020 1614   GFRAA >60 12/24/2013 1044   Lipase     Component Value Date/Time   LIPASE 64 (H) 06/11/2023 0036   LIPASE 92 12/24/2013 1044       Studies/Results: No results found.  Anti-infectives: Anti-infectives (From admission, onward)    Start     Dose/Rate Route Frequency Ordered Stop   06/21/23 1200  Ampicillin-Sulbactam (UNASYN) 3 g in sodium chloride 0.9 % 100 mL IVPB        3 g 200 mL/hr over 30 Minutes Intravenous Every 6 hours 06/21/23 0807     06/20/23 1045  fluconazole (DIFLUCAN) IVPB 200 mg        200 mg 100 mL/hr over 60 Minutes Intravenous Every 24 hours 06/20/23 0955     06/16/23 1730  piperacillin-tazobactam (  ZOSYN) IVPB 3.375 g  Status:  Discontinued        3.375 g 12.5 mL/hr over 240 Minutes Intravenous Every 8 hours 06/16/23 1634 06/21/23 9147        Assessment/Plan Hx of prior gastric sleeve at [facility] with Dr. Anselm Lis in 2015 - not interested in traveling to [facility] for bariatric care so will follow up here in [facility] 10 cm fluid collection adjacent to staple line secondary to a contained gastric perforation - EGD 8/7 showed narrowing of the proximal stomach w/ an associated band and an additional connection to the distal stomach  - s/p IR drain into LUQ collection 8/2 >> several hundred mL purulent material aspirated  -Tolerating regular diet today (was tolerating this prior to admission) -Will wean off TPN today and consider discharge home on Monday with follow up with Dr. Dossie Der and interventional radiology.  May also need additional endoscopic evaluations or  treatments with Dr. Meridee Score but we can help coordinate this as an outpatient. - Hopefully with drainage and time the sleeve will heal - cont unasyn and diflucan - add multimodal pain regimen/ wean off iv meds    FEN: reg,  TPN VTE: LMWH ID: [email or web address]/diflucan    - per TRH -  Asthma Recent dx of IDDM, DKA, resolved AKI, resolved Transaminitis, improving  I reviewed Consultant GI notes, hospitalist notes, last 24 h vitals and pain scores, last 48 h intake and output, last 24 h labs and trends, and last 24 h imaging results.    LOS: 16 days    Berna Bue, MD [facility] Surgery 06/24/2023, 8:45 AM Please see Amion for pager number during day hours 7:00am-4:30pm

## 2023-06-24 NOTE — Progress Notes (Signed)
PHARMACY - TOTAL PARENTERAL NUTRITION CONSULT NOTE   Indication:  gastric leak  Patient Measurements: Height: 5\' 5"  (165.1 cm) Weight: 101.3 kg (223 lb 5.2 oz) IBW/kg (Calculated) : 57   Body mass index is 37.16 kg/m. Adj Body Weight: 75 kg Usual Weight: ~97.3 kg  Assessment:  52 yo F with PMH of T2DM, asthma, obesity s/p gastric sleeve in 2015, and GERD. Pt presented on 7/24 with AMS and was found to have metabolic acidosis and respiratory alkalosis with uncontrolled BG. LFTs were elevated on admission and GI was consulted. LFTs initially improved, then peaked on 06/11/23. On 8/1, UGI showed a gastric leak likely due to suture dehiscence of previous gastric sleeve performed in 2015. Surgery was consulted on 8/1 and patient was made NPO. A 10mL fluid collection was also noted adjacent to the stable line and the gastric perforation. IR has been consulted for drain placement. Pharmacy was consulted for TPN initiation on 8/2 due to gastric leak to provide nutrition.  Per patient, usual weight is 225lbs and has not lost weight recently to her knowledge. She was eating about 50% of meals last week, mainly because she did not like the food here. Normally she eats 3-4 meals a day at home including fruits, vegetables, hamburgers, [place] etc/   Glucose / Insulin: T2DM, A1c 11.7% (05/27/23), 30u within TPN (at half rate), 21 units SSI/24h  CBG: 141-216 -PTA used insulin glargine 22 units at bedtime and insulin lispro 6 units TID Electrolytes: Na: 132 (Na load from Unasyn noted), K 4, CoCa 10.3, Phos 2.9 (increased 8/8), Mg 1.8 (increased 8/8 +2g given outside TPN), HCO3: 24, Cl: 101,  others WNL  Renal: SCr and BUN WNL  Hepatic: 8/8: alk phos/ ALT/Tbili WNL,  AST wnl, albumin 1.9 Intake / Output; MIVF: UOP not documented in past 24h - 4 unmeasured occurrences, drains 80ml, LBM 8/8, note lasix increased to 40 IV BID GI Imaging: 7/24 KUB: cholelithiasis, hepatic steatosis  7/25 CT: no acute findings   7/30 MRI: LUQ gas and fluid collection 7/31 CT: large LUQ gas and fluid collection concerning for contained perforation/leak or abscess  8/1 UGI: gastric leak likely due to suture dehiscence from prior sleeve gastrectomy GI Surgeries / Procedures: 8/2 Drain to LUQ abscess  8/5 EGD   Central access: PICC placed 06/16/23 TPN start date: 06/16/23  Nutritional Goals: Goal TPN rate is 85 mL/hr (provides 114 g of protein and 2149 kcals per day)  RD Assessment: Estimated Needs Total Energy Estimated Needs: 2100-2300 Total Protein Estimated Needs: 110-130 grams Total Fluid Estimated Needs: >2.0 L  Current Nutrition:  TPN  8/8: CLD starting  8/9: regular diet - 75%   Plan:  TPN currently running at 50% - Will stop after today's bag is complete per orders.  Discontinue TPN labs and associated orders.  Change Resistant SSI to ACHS.  Monitor CBG control.  Will sign off consult -please re-consult if needed.   Link Snuffer, PharmD, BCPS, BCCCP Please check AMION for all [facility] phone numbers After 10:00 PM, call Main Pharmacy [phone]

## 2023-06-24 NOTE — Progress Notes (Signed)
PROGRESS NOTE    Frances Rodgers  SAY:[id number] DOB: 1971/08/14 DOA: 06/07/2023 PCP: Tollie Eth, NP  Chief Complaint  Patient presents with   Altered Mental Status    Brief Narrative:   52 year old with Frances Rodgers history of mild intermittent asthma and recently diagnosed DM who was brought to the ER by [name] family member with altered mental status.  Workup revealed elevated LFTs.   Significant Events: 8/2 percutaneous IR drain and L UQ collection yielding several 100 mL of purulent material 8/2 PICC line placed    Assessment & Plan:   Principal Problem:   AMS (altered mental status) Active Problems:   DKA (diabetic ketoacidosis) (HCC)   Acute metabolic encephalopathy   Asthma   Nausea & vomiting   Transaminitis   Gastric perforation (HCC)  Abdominal Pain Contained Gastric Perforation Hx Gastric Sleeve 2015 Incidentally noted on MRI abdomen -follow-up CT confirmed this finding with appearance worrisome for possible contained perforation given proximity to previous suture line from gastric bypass procedure - UGI 8/1 c/w Frances Rodgers leak and contained perforation of the gastric fundus 7/31 had large LUQ gas and fluid collection measuring 10.1 cm lateral to the stomach concerning for contained perforation, leak, and or abscess s/p placement of 12 french drainage catheter into LUQ abscess Culture with klebsiella, lactobacillus, mixed anaerobic flora Now on unasyn, fluconazole  General Surgery coordinating care with the GI team - wean TPN today, consider d/c home Monday and follow up with Dr. Dossie Der and IR as an outpatient - trying regular diet today EGD 8/7 -> GI recommending continuing TPN, no further inpatient GI workup at this time -will likely eventually require conversion to [name] Roux-en-Y bypass   Acute metabolic encephalopathy of unclear etiology resolved   Acute transaminitis US revealed no obstructive gallstones and no suggestion of acute cholecystitis  CT abdomen 7/25 without  acute findings  viral hepatitis panel negative  possible shock liver versus drug-induced liver injury vs CBD stricture or infection per GI AMA and ASMA negative  CMV IgM negative  MRCP 7/21 revealed Frances Rodgers mild intrahepatic bile duct dilatation with Frances Rodgers focal area of narrowing involving the proximal common bile duct 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 -LFTs continue to improve and are now approaching normal  GI reports that further evaluation will be required when above issues are stabilized   Acute kidney injury - resolved  Felt to be due to poor intake/dehydration -now resolved with normal creatinine/GFR   DM2 with starvation ketoacidosis CBG well-controlled at this time A1c 11.7 05/2023   Peripheral edema Hypoalbuminemia Volume overload  Venous duplex negative for DVT Follow BNP - she's net positive 11.3 L  IV lasix Echo 06/08/2023 with EF 70-75%, grade 1 diastolic dysfunction Albumin 1.7 on 8/5 Repeat UA, UP/C (pending collection)   Chronic mild intermittent asthma Stable at present   Obesity - Body mass index is 37.16 kg/m. Status post gastric sleeve 2015     DVT prophylaxis: lovenox Code Status: full Family Communication: none Disposition:   Status is: Inpatient Remains inpatient appropriate because: continued inpatient care   Consultants:  GI Surgery IR  Procedures:  8/2 Successful placement of 12 French drainage catheter into the left upper quadrant abscess.  Antimicrobials:  Anti-infectives (From admission, onward)    Start     Dose/Rate Route Frequency Ordered Stop   06/21/23 1200  Ampicillin-Sulbactam (UNASYN) 3 g in sodium chloride 0.9 % 100 mL IVPB        3 g 200 mL/hr over 30 Minutes Intravenous Every  6 hours 06/21/23 0807     06/20/23 1045  fluconazole (DIFLUCAN) IVPB 200 mg        200 mg 100 mL/hr over 60 Minutes Intravenous Every 24 hours 06/20/23 0955     06/16/23 1730  piperacillin-tazobactam (ZOSYN) IVPB 3.375 g  Status:  Discontinued         3.375 g 12.5 mL/hr over 240 Minutes Intravenous Every 8 hours 06/16/23 1634 06/21/23 0807       Subjective: Asking about pain meds plan  antibiotics  Objective: Vitals:   06/23/23 1728 06/23/23 1909 06/24/23 0545 06/24/23 0842  BP: 131/81 138/74 121/67 130/74  Pulse: (!) 101 (!) 102 97 (!) 105  Resp: 19 18 16 18   Temp: 98.5 F (36.9 C) 98.7 F (37.1 C) 98.6 F (37 C) 98.4 F (36.9 C)  TempSrc: Oral Oral Oral   SpO2: 99% 100% 100% 100%  Weight:      Height:        Intake/Output Summary (Last 24 hours) at 06/24/2023 1649 Last data filed at 06/24/2023 0800 Gross per 24 hour  Intake 730.42 ml  Output 60 ml  Net 670.42 ml   Filed Weights   06/17/23 0500 06/20/23 0350 06/21/23 0241  Weight: 101.3 kg 101.5 kg 101.3 kg    Examination:  General: No acute distress. Cardiovascular: RRR Lungs: unlabored Abdomen: improved abdominal discomfort, drain with tan drainage Neurological: Alert and oriented 3. Moves all extremities 4 with equal strength. Cranial nerves II through XII grossly intact. Extremities: continued LE edema    Data Reviewed: I have personally reviewed following labs and imaging studies  CBC: Recent Labs  Lab 06/19/23 0856 06/22/23 0451  WBC 4.8 6.2  NEUTROABS  --  4.6  HGB 8.4* 8.4*  HCT 29.6* 28.8*  MCV 86.0 87.5  PLT 179 144*    Basic Metabolic Panel: Recent Labs  Lab 06/19/23 0856 06/20/23 0835 06/22/23 0451 06/23/23 0328 06/24/23 0428  NA 136 135 136 132* 132*  K 4.2 4.2 4.1 3.9 4.0  CL 106 107 106 100 101  CO2 22 21* 25 23 24   GLUCOSE 174* 221* 160* 149* 141*  BUN 13 15 12 13 11   CREATININE 0.56 0.57 0.65 0.56 0.57  CALCIUM 8.4* 8.5* 8.5* 8.6* 8.5*  MG 1.8 1.9 1.8 1.7 1.8  PHOS 2.9 3.1 2.9 3.0 2.9    GFR: Estimated Creatinine Clearance: 99.2 mL/min (by C-G formula based on SCr of 0.57 mg/dL).  Liver Function Tests: Recent Labs  Lab 06/19/23 0856 06/22/23 0451  AST 35 26  ALT 73* 40  ALKPHOS 147* 107  BILITOT 1.7*  1.2  PROT 6.4* 6.0*  ALBUMIN 1.9* 1.9*    CBG: Recent Labs  Lab 06/24/23 0005 06/24/23 0548 06/24/23 0728 06/24/23 1152 06/24/23 1622  GLUCAP 141* 165* 159* 179* 173*     Recent Results (from the past 240 hour(s))  Aerobic/Anaerobic Culture w Gram Stain (surgical/deep wound)     Status: None   Collection Time: 06/16/23  3:15 PM   Specimen: Abscess  Result Value Ref Range Status   Specimen Description ABSCESS  Final   Special Requests Normal  Final   Gram Stain   Final    ABUNDANT WBC PRESENT, PREDOMINANTLY PMN RARE BUDDING YEAST SEEN RARE GRAM NEGATIVE RODS FEW GRAM POSITIVE COCCI IN CHAINS ABUNDANT GRAM POSITIVE RODS Performed at [facility], [address] [place], [place] [address]    Culture   Final    ABUNDANT KLEBSIELLA PNEUMONIAE ABUNDANT LACTOBACILLUS  SPECIES Standardized susceptibility testing for this organism is not available. MIXED ANAEROBIC FLORA PRESENT.  CALL LAB IF FURTHER IID REQUIRED.    Report Status 06/19/2023 FINAL  Final   Organism ID, Bacteria KLEBSIELLA PNEUMONIAE  Final      Susceptibility   Klebsiella pneumoniae - MIC*    AMPICILLIN RESISTANT Resistant     CEFEPIME <=0.12 SENSITIVE Sensitive     CEFTAZIDIME <=1 SENSITIVE Sensitive     CEFTRIAXONE <=0.25 SENSITIVE Sensitive     CIPROFLOXACIN <=0.25 SENSITIVE Sensitive     GENTAMICIN <=1 SENSITIVE Sensitive     IMIPENEM <=0.25 SENSITIVE Sensitive     TRIMETH/SULFA <=20 SENSITIVE Sensitive     AMPICILLIN/SULBACTAM <=2 SENSITIVE Sensitive     PIP/TAZO <=4 SENSITIVE Sensitive     * ABUNDANT KLEBSIELLA PNEUMONIAE         Radiology Studies: No results found.      Scheduled Meds:  (feeding supplement) PROSource Plus  30 mL Oral Daily   acetaminophen  650 mg Oral Q6H   Chlorhexidine Gluconate Cloth  6 each Topical Daily   docusate sodium  100 mg Oral BID   enoxaparin (LOVENOX) injection  40 mg Subcutaneous Q24H   feeding supplement  1 Container Oral Q24H   furosemide   40 mg Intravenous BID   gabapentin  300 mg Oral TID   [START ON 06/25/2023] insulin aspart  0-20 Units Subcutaneous TID WC   insulin aspart  0-5 Units Subcutaneous QHS   methocarbamol  500 mg Oral TID   pantoprazole (PROTONIX) IV  40 mg Intravenous Q12H   polyethylene glycol  17 g Oral Daily   sodium chloride flush  10-40 mL Intracatheter Q12H   sodium chloride flush  5 mL Intracatheter Q8H   Continuous Infusions:  sodium chloride 10 mL/hr at 06/22/23 2059   ampicillin-sulbactam (UNASYN) IV 3 g (06/24/23 1647)   fluconazole (DIFLUCAN) IV 200 mg (06/24/23 0905)   methocarbamol (ROBAXIN) IV 500 mg (06/20/23 2022)   TPN ADULT (ION) 40 mL/hr at 06/23/23 1751     LOS: 16 days    Time spent: over 30 min    Lacretia Nicks, MD [facility]   To contact the attending provider between 7A-7P or the covering provider during after hours 7P-7A, please log into the web site [email or web address] and access using universal [place] password for that web site. If you do not have the password, please call the hospital operator.  06/24/2023, 4:49 PM

## 2023-06-25 ENCOUNTER — Encounter (HOSPITAL_COMMUNITY): Payer: Self-pay | Admitting: Gastroenterology

## 2023-06-25 DIAGNOSIS — K651 Peritoneal abscess: Secondary | ICD-10-CM | POA: Diagnosis not present

## 2023-06-25 DIAGNOSIS — K255 Chronic or unspecified gastric ulcer with perforation: Secondary | ICD-10-CM | POA: Diagnosis not present

## 2023-06-25 LAB — COMPREHENSIVE METABOLIC PANEL WITH GFR
ALT: 26 U/L (ref 0–44)
AST: 23 U/L (ref 15–41)
Albumin: 2.8 g/dL — ABNORMAL LOW (ref 3.5–5.0)
Alkaline Phosphatase: 95 U/L (ref 38–126)
Anion gap: 10 (ref 5–15)
BUN: 13 mg/dL (ref 6–20)
CO2: 25 mmol/L (ref 22–32)
Calcium: 9 mg/dL (ref 8.9–10.3)
Chloride: 97 mmol/L — ABNORMAL LOW (ref 98–111)
Creatinine, Ser: 0.68 mg/dL (ref 0.44–1.00)
GFR, Estimated: >60 mL/min (ref >60)
Glucose, Bld: 147 mg/dL — ABNORMAL HIGH (ref 70–99)
Potassium: 4.1 mmol/L (ref 3.5–5.1)
Sodium: 132 mmol/L — ABNORMAL LOW (ref 135–145)
Total Bilirubin: 1.6 mg/dL — ABNORMAL HIGH (ref 0.3–1.2)
Total Protein: 6.5 g/dL (ref 6.5–8.1)

## 2023-06-25 LAB — CBC WITH DIFFERENTIAL/PLATELET
Abs Immature Granulocytes: 0 10*3/uL (ref 0.00–0.07)
Basophils Absolute: 0 10*3/uL (ref 0.0–0.1)
Basophils Relative: 0 %
Eosinophils Absolute: 0.1 10*3/uL (ref 0.0–0.5)
Eosinophils Relative: 1 %
HCT: 27.7 % — ABNORMAL LOW (ref 36.0–46.0)
Hemoglobin: 8 g/dL — ABNORMAL LOW (ref 12.0–15.0)
Lymphocytes Relative: 18 %
Lymphs Abs: 0.9 10*3/uL (ref 0.7–4.0)
MCH: 25.5 pg — ABNORMAL LOW (ref 26.0–34.0)
MCHC: 28.9 g/dL — ABNORMAL LOW (ref 30.0–36.0)
MCV: 88.2 fL (ref 80.0–100.0)
Monocytes Absolute: 0.4 10*3/uL (ref 0.1–1.0)
Monocytes Relative: 8 %
Neutro Abs: 3.7 10*3/uL (ref 1.7–7.7)
Neutrophils Relative %: 73 %
Platelets: 132 10*3/uL — ABNORMAL LOW (ref 150–400)
RBC: 3.14 MIL/uL — ABNORMAL LOW (ref 3.87–5.11)
RDW: 24.7 % — ABNORMAL HIGH (ref 11.5–15.5)
WBC: 5.1 10*3/uL (ref 4.0–10.5)
nRBC: 0 % (ref 0.0–0.2)
nRBC: 0 /100{WBCs}

## 2023-06-25 LAB — GLUCOSE, CAPILLARY
Glucose-Capillary: 181 mg/dL — ABNORMAL HIGH (ref 70–99)
Glucose-Capillary: 142 mg/dL — ABNORMAL HIGH (ref 70–99)
Glucose-Capillary: 180 mg/dL — ABNORMAL HIGH (ref 70–99)
Glucose-Capillary: 179 mg/dL — ABNORMAL HIGH (ref 70–99)

## 2023-06-25 LAB — PHOSPHORUS: Phosphorus: 3.5 mg/dL (ref 2.5–4.6)

## 2023-06-25 LAB — MAGNESIUM: Magnesium: 1.6 mg/dL — ABNORMAL LOW (ref 1.7–2.4)

## 2023-06-25 MED ORDER — MAGNESIUM SULFATE 2 GM/50ML IV SOLN
2.0000 g | Freq: Once | INTRAVENOUS | Status: AC
  Administered 2023-06-25: 2 g via INTRAVENOUS
  Filled 2023-06-25: qty 50

## 2023-06-25 MED ORDER — AMOXICILLIN-POT CLAVULANATE 875-125 MG PO TABS
1.0000 | ORAL_TABLET | Freq: Two times a day (BID) | ORAL | Status: DC
  Administered 2023-06-25 – 2023-06-26 (×2): 1 via ORAL
  Filled 2023-06-25 (×3): qty 1

## 2023-06-25 NOTE — Progress Notes (Signed)
PROGRESS NOTE    Frances Rodgers  KVQ:[id number] DOB: May 04, 1971 DOA: 06/07/2023 PCP: Tollie Eth, NP  Chief Complaint  Patient presents with   Altered Mental Status    Brief Narrative:   52 year old with Frances Rodgers history of mild intermittent asthma and recently diagnosed DM who was brought to the ER by Frances Rodgers family member with altered mental status.  Workup revealed elevated LFTs.   Significant Events: 8/2 percutaneous IR drain and L UQ collection yielding several 100 mL of purulent material 8/2 PICC line placed    Assessment & Plan:   Principal Problem:   AMS (altered mental status) Active Problems:   DKA (diabetic ketoacidosis) (HCC)   Acute metabolic encephalopathy   Asthma   Nausea & vomiting   Transaminitis   Gastric perforation (HCC)  Abdominal Pain Contained Gastric Perforation Hx Gastric Sleeve 2015 Incidentally noted on MRI abdomen -follow-up CT confirmed this finding with appearance worrisome for possible contained perforation given proximity to previous suture line from gastric bypass procedure - UGI 8/1 c/w Danyiel Crespin leak and contained perforation of the gastric fundus 7/31 had large LUQ gas and fluid collection measuring 10.1 cm lateral to the stomach concerning for contained perforation, leak, and or abscess s/p placement of 12 french drainage catheter into LUQ abscess Culture with klebsiella, lactobacillus, mixed anaerobic flora Now on unasyn, fluconazole  General Surgery coordinating care with the GI team - off TPN, consider d/c home Monday and follow up with Dr. Dossie Der and IR as an outpatient  Will consult ID for assistance with antibiotic planning EGD 8/7 -> GI recommending continuing TPN, no further inpatient GI workup at this time -will likely eventually require conversion to Ezri Landers Roux-en-Y bypass   Acute metabolic encephalopathy of unclear etiology resolved   Acute transaminitis US revealed no obstructive gallstones and no suggestion of acute cholecystitis  CT  abdomen 7/25 without acute findings  viral hepatitis panel negative  possible shock liver versus drug-induced liver injury vs CBD stricture or infection per GI AMA and ASMA negative  CMV IgM negative  MRCP 7/21 revealed Coreon Simkins mild intrahepatic bile duct dilatation with Medhansh Brinkmeier focal area of narrowing involving the proximal common bile duct 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 -LFTs continue to improve and are now approaching normal  GI reports that further evaluation will be required when above issues are stabilized   Acute kidney injury - resolved  Felt to be due to poor intake/dehydration -now resolved with normal creatinine/GFR   DM2 with starvation ketoacidosis CBG well-controlled at this time A1c 11.7 05/2023   Peripheral edema Hypoalbuminemia Volume overload  Venous duplex negative for DVT Follow BNP - she's net positive 12.7 L IV lasix, will plan to discharge with PO  Echo 06/08/2023 with EF 70-75%, grade 1 diastolic dysfunction Albumin 1.7 on 8/5 Repeat UA (negative), UP/C (0.5)   Chronic mild intermittent asthma Stable at present   Obesity - Body mass index is 37.27 kg/m. Status post gastric sleeve 2015     DVT prophylaxis: lovenox Code Status: full Family Communication: none Disposition:   Status is: Inpatient Remains inpatient appropriate because: continued inpatient care   Consultants:  GI Surgery IR  Procedures:  8/2 Successful placement of 12 French drainage catheter into the left upper quadrant abscess.  Antimicrobials:  Anti-infectives (From admission, onward)    Start     Dose/Rate Route Frequency Ordered Stop   06/21/23 1200  Ampicillin-Sulbactam (UNASYN) 3 g in sodium chloride 0.9 % 100 mL IVPB  3 g 200 mL/hr over 30 Minutes Intravenous Every 6 hours 06/21/23 0807     06/20/23 1045  fluconazole (DIFLUCAN) IVPB 200 mg        200 mg 100 mL/hr over 60 Minutes Intravenous Every 24 hours 06/20/23 0955     06/16/23 1730  piperacillin-tazobactam  (ZOSYN) IVPB 3.375 g  Status:  Discontinued        3.375 g 12.5 mL/hr over 240 Minutes Intravenous Every 8 hours 06/16/23 1634 06/21/23 0807       Subjective: Asking about discharge plan, abx, picc, etc  Objective: Vitals:   06/24/23 2052 06/25/23 0402 06/25/23 0500 06/25/23 0817  BP: 111/66 125/75  121/69  Pulse: 89 91  99  Resp: 18 16  17   Temp: 98.4 F (36.9 C) 98.1 F (36.7 C)  98.7 F (37.1 C)  TempSrc:  Oral    SpO2: 100% 100%  100%  Weight:   101.6 kg   Height:        Intake/Output Summary (Last 24 hours) at 06/25/2023 1452 Last data filed at 06/25/2023 0825 Gross per 24 hour  Intake 862.35 ml  Output 22.5 ml  Net 839.85 ml   Filed Weights   06/20/23 0350 06/21/23 0241 06/25/23 0500  Weight: 101.5 kg 101.3 kg 101.6 kg    Examination:  General: No acute distress. Cardiovascular: RRR Lungs: unlabored Neurological: Alert and oriented 3. Moves all extremities 4 with equal strength. Cranial nerves II through XII grossly intact. Extremities: continued bilateral edema   Data Reviewed: I have personally reviewed following labs and imaging studies  CBC: Recent Labs  Lab 06/19/23 0856 06/22/23 0451 06/25/23 0223  WBC 4.8 6.2 5.1  NEUTROABS  --  4.6 3.7  HGB 8.4* 8.4* 8.0*  HCT 29.6* 28.8* 27.7*  MCV 86.0 87.5 88.2  PLT 179 144* 132*    Basic Metabolic Panel: Recent Labs  Lab 06/20/23 0835 06/22/23 0451 06/23/23 0328 06/24/23 0428 06/25/23 0223  NA 135 136 132* 132* 132*  K 4.2 4.1 3.9 4.0 4.1  CL 107 106 100 101 97*  CO2 21* 25 23 24 25   GLUCOSE 221* 160* 149* 141* 147*  BUN 15 12 13 11 13   CREATININE 0.57 0.65 0.56 0.57 0.68  CALCIUM 8.5* 8.5* 8.6* 8.5* 9.0  MG 1.9 1.8 1.7 1.8 1.6*  PHOS 3.1 2.9 3.0 2.9 3.5    GFR: Estimated Creatinine Clearance: 99.3 mL/min (by C-G formula based on SCr of 0.68 mg/dL).  Liver Function Tests: Recent Labs  Lab 06/19/23 0856 06/22/23 0451 06/25/23 0223  AST 35 26 23  ALT 73* 40 26  ALKPHOS 147*  107 95  BILITOT 1.7* 1.2 1.6*  PROT 6.4* 6.0* 6.5  ALBUMIN 1.9* 1.9* 2.8*    CBG: Recent Labs  Lab 06/24/23 1152 06/24/23 1622 06/24/23 2054 06/25/23 0813 06/25/23 1202  GLUCAP 179* 173* 158* 181* 142*     Recent Results (from the past 240 hour(s))  Aerobic/Anaerobic Culture w Gram Stain (surgical/deep wound)     Status: None   Collection Time: 06/16/23  3:15 PM   Specimen: Abscess  Result Value Ref Range Status   Specimen Description ABSCESS  Final   Special Requests Normal  Final   Gram Stain   Final    ABUNDANT WBC PRESENT, PREDOMINANTLY PMN RARE BUDDING YEAST SEEN RARE GRAM NEGATIVE RODS FEW GRAM POSITIVE COCCI IN CHAINS ABUNDANT GRAM POSITIVE RODS Performed at [facility], [address], [place], [place] [address]    Culture  Final    ABUNDANT KLEBSIELLA PNEUMONIAE ABUNDANT LACTOBACILLUS SPECIES Standardized susceptibility testing for this organism is not available. MIXED ANAEROBIC FLORA PRESENT.  CALL LAB IF FURTHER IID REQUIRED.    Report Status 06/19/2023 FINAL  Final   Organism ID, Bacteria KLEBSIELLA PNEUMONIAE  Final      Susceptibility   Klebsiella pneumoniae - MIC*    AMPICILLIN RESISTANT Resistant     CEFEPIME <=0.12 SENSITIVE Sensitive     CEFTAZIDIME <=1 SENSITIVE Sensitive     CEFTRIAXONE <=0.25 SENSITIVE Sensitive     CIPROFLOXACIN <=0.25 SENSITIVE Sensitive     GENTAMICIN <=1 SENSITIVE Sensitive     IMIPENEM <=0.25 SENSITIVE Sensitive     TRIMETH/SULFA <=20 SENSITIVE Sensitive     AMPICILLIN/SULBACTAM <=2 SENSITIVE Sensitive     PIP/TAZO <=4 SENSITIVE Sensitive     * ABUNDANT KLEBSIELLA PNEUMONIAE         Radiology Studies: No results found.      Scheduled Meds:  (feeding supplement) PROSource Plus  30 mL Oral Daily   acetaminophen  650 mg Oral Q6H   Chlorhexidine Gluconate Cloth  6 each Topical Daily   docusate sodium  100 mg Oral BID   enoxaparin (LOVENOX) injection  40 mg Subcutaneous Q24H   feeding supplement  1  Container Oral Q24H   furosemide  40 mg Intravenous BID   gabapentin  300 mg Oral TID   insulin aspart  0-20 Units Subcutaneous TID WC   insulin aspart  0-5 Units Subcutaneous QHS   methocarbamol  500 mg Oral TID   pantoprazole (PROTONIX) IV  40 mg Intravenous Q12H   polyethylene glycol  17 g Oral Daily   sodium chloride flush  10-40 mL Intracatheter Q12H   sodium chloride flush  5 mL Intracatheter Q8H   Continuous Infusions:  sodium chloride 10 mL/hr at 06/24/23 2121   ampicillin-sulbactam (UNASYN) IV 3 g (06/25/23 1246)   fluconazole (DIFLUCAN) IV 200 mg (06/25/23 0917)   methocarbamol (ROBAXIN) IV 500 mg (06/20/23 2022)     LOS: 17 days    Time spent: over 30 min    Lacretia Nicks, MD [facility]   To contact the attending provider between 7A-7P or the covering provider during after hours 7P-7A, please log into the web site [email or web address] and access using universal [place] password for that web site. If you do not have the password, please call the hospital operator.  06/25/2023, 2:52 PM

## 2023-06-25 NOTE — Consult Note (Signed)
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  LFTs elevated.  Initial CT showed cholelithiasis otherwise unrevealing.  MRCP showed 9 cm collection of gas and fluid lateral to the stomach in the left upper quadrant.  Repeat CT abdomen pelvis showed large fluid collection next to the stomach from her previous gastric sleeve surgery - GI following, general surgery following.  Upper GI series confirmed leak/contact perforation of the gastric fundus due to suture dehiscence from prior gastrectomy.  Patient underwent CT-guided drain placement with polymicrobial cultures.  ID engaged for antibiotic recommendations. - 8/2 cultures grew abundant Klebsiella pneumonia, abundant lactobacillus species, mixed anaerobic flora.  Gram stain showed rare budding yeast in addition to GNR GPC, GPR.  Given that it is intra-abdominal abscess would continue fluconazole for fungal coverage given yeast seen on Gram stain, Augmentin for Klebsiella anaerobes a sufficient - Patient underwent EGD on 8/7, stent was not feasible, recommended further management per surgery. Surgery anticipates discharge home on Monday Recommendations:  -DC Unasyn - Start Augmentin 875/125 mg PO bid, continue fluconazole 200mg  (transition to PO tomorrow).  EKG on 7/24  with QTc 470.  Repeat EKG. Pt off of TPN - Will need repeat imaging prior to stopping antibiotics.  Plan at least 4 weeks antibiotics form drain placement.  - Follow-up with infectious disease on 9/5 with myself. - ID will sign off  #Elevated liver chemistry - Improving Microbiology:   Antibiotics: 8/2 - 8/6 pip-tazo 8/6-present fluconazole 8/7-present Unasyn   Cultures: Other 06/16/2023 abscess cultures Gram Stain ABUNDANT WBC PRESENT, PREDOMINANTLY PMN RARE BUDDING YEAST SEEN RARE GRAM NEGATIVE RODS FEW GRAM POSITIVE COCCI IN CHAINS ABUNDANT GRAM POSITIVE RODS Performed at [facility], [address], [place], [place] [address]  Culture ABUNDANT KLEBSIELLA PNEUMONIAE ABUNDANT LACTOBACILLUS SPECIES Standardized susceptibility testing for this organism is not available. MIXED ANAEROBIC FLORA PRESENT.  CALL LAB IF FURTHER IID REQUIRED.    HPI: Frances Rodgers is a 52 y.o. female with past medical history of asthma, gastric sleeve in 2015 recently diagnosed IDDM, brought in by family member for mentation changes.  On arrival to the ED patient tachycardic admitted for acute metabolic encephalopathy.  Workup revealed elevated LFTs.  CT abdominal pelvis with contrast showed cholelithiasis.  She underwent MRCP which revealed 9 cm air-fluid collection in left upper quadrant consider possible contained leak of her gastric staple line.  New CT confirmed this.  She underwent drain placement of intra-abdominal fluid collection noted several 100 mm purulent material aspirated, cultures polymicrobial.  Patient on Unasyn and fluconazole.  ID engaged for antibiotic recommendations.   Review of Systems: ROS  Past Medical History:  Diagnosis Date   Allergy    Anemia    Asthma    Blood transfusion without reported diagnosis 2023   GERD (gastroesophageal reflux disease)  Seasonal allergies     Social History   Tobacco Use   Smoking status: Never   Smokeless tobacco: Never   Vaping Use   Vaping status: Never Used  Substance Use Topics   Alcohol use: Never   Drug use: Never    Family History  Problem Relation Age of Onset   Diabetes Mother    Stroke Mother    Diabetes Brother    Stroke Brother    Pancreatic cancer Maternal Grandmother    Scheduled Meds:  (feeding supplement) PROSource Plus  30 mL Oral Daily   acetaminophen  650 mg Oral Q6H   Chlorhexidine Gluconate Cloth  6 each Topical Daily   docusate sodium  100 mg Oral BID   enoxaparin (LOVENOX) injection  40 mg Subcutaneous Q24H   feeding supplement  1 Container Oral Q24H   furosemide  40 mg Intravenous BID   gabapentin  300 mg Oral TID   insulin aspart  0-20 Units Subcutaneous TID WC   insulin aspart  0-5 Units Subcutaneous QHS   methocarbamol  500 mg Oral TID   pantoprazole (PROTONIX) IV  40 mg Intravenous Q12H   polyethylene glycol  17 g Oral Daily   sodium chloride flush  10-40 mL Intracatheter Q12H   sodium chloride flush  5 mL Intracatheter Q8H   Continuous Infusions:  sodium chloride 10 mL/hr at 06/24/23 2121   ampicillin-sulbactam (UNASYN) IV Stopped (06/25/23 2134)   fluconazole (DIFLUCAN) IV 200 mg (06/25/23 0917)   methocarbamol (ROBAXIN) IV 500 mg (06/20/23 2022)   PRN Meds:.sodium chloride, albuterol, methocarbamol (ROBAXIN) IV, morphine injection, [DISCONTINUED] ondansetron **OR** ondansetron (ZOFRAN) IV, mouth rinse, oxyCODONE **OR** oxyCODONE, sodium chloride flush Allergies  Allergen Reactions   Aspirin Anaphylaxis and Shortness Of Breath   Ketorolac Anaphylaxis   Ketorolac Tromethamine Shortness Of Breath    OBJECTIVE: Blood pressure 117/66, pulse 93, temperature 98.4 F (36.9 C), resp. rate 18, height 5\' 5"  (1.651 m), weight 101.6 kg, last menstrual period 06/08/2023, SpO2 97%.  Physical Exam  Lab Results Lab Results  Component Value Date   WBC 5.1 06/25/2023   HGB 8.0 (L) 06/25/2023   HCT 27.7 (L) 06/25/2023   MCV 88.2 06/25/2023   PLT 132 (L)  06/25/2023    Lab Results  Component Value Date   CREATININE 0.68 06/25/2023   BUN 13 06/25/2023   NA 132 (L) 06/25/2023   K 4.1 06/25/2023   CL 97 (L) 06/25/2023   CO2 25 06/25/2023    Lab Results  Component Value Date   ALT 26 06/25/2023   AST 23 06/25/2023   ALKPHOS 95 06/25/2023   BILITOT 1.6 (H) 06/25/2023       Danelle Earthly, MD [facility] 06/25/2023, 11:02 PM  I have personally spent 84 minutes involved in face-to-face and non-face-to-face activities for this patient on the day of the visit. Professional time spent includes the following activities: Preparing to see the patient (review of tests), Obtaining and/or reviewing separately obtained history (admission/discharge record), Performing a medically appropriate examination and/or evaluation , Ordering medications/tests/procedures, referring and communicating with other health care professionals, Documenting clinical information in the EMR, Independently interpreting results (not separately reported), Communicating results to the patient/family/caregiver, Counseling and educating the patient/family/caregiver and Care coordination (not separately reported).

## 2023-06-25 NOTE — Progress Notes (Signed)
Notified IV team that patient has order to discontinue PICC line.

## 2023-06-25 NOTE — Plan of Care (Signed)
  Problem: Nutritional: Goal: Maintenance of adequate nutrition will improve Outcome: Progressing   Problem: Activity: Goal: Risk for activity intolerance will decrease 06/25/2023 0240 by Rosie Fate, RN Outcome: Progressing 06/25/2023 0238 by Rosie Fate, RN Outcome: Progressing   Problem: Pain Managment: Goal: General experience of comfort will improve Outcome: Progressing

## 2023-06-25 NOTE — Progress Notes (Signed)
[facility] Progress Note  4 Days Post-Op  Subjective: Feels well today.  Does note epigastric and left upper quadrant pain not necessarily exacerbated by eating.  JP continues to be purulent, she is hopeful to go home tomorrow  Objective: Vital signs in last 24 hours: Temp:  [98.1 F (36.7 C)-98.7 F (37.1 C)] 98.7 F (37.1 C) (08/11 0817) Pulse Rate:  [89-100] 99 (08/11 0817) Resp:  [16-18] 17 (08/11 0817) BP: (111-127)/(66-75) 121/69 (08/11 0817) SpO2:  [100 %] 100 % (08/11 0817) Weight:  [101.6 kg] 101.6 kg (08/11 0500) Last BM Date : 06/24/23  Intake/Output from previous day: 08/10 0701 - 08/11 0700 In: 847.4 [P.O.:560; I.V.:72.4; IV Piggyback:200] Out: 42.5 [Drains:42.5] Intake/Output this shift: Total I/O In: 240 [P.O.:240] Out: 0   PE: Gen:  Alert Abd: Soft, tenderness on mostly left side of the abdomen around her drain site.  JP w/ tan purulent output w/ fibrinous exudate Psych: A&Ox4   Lab Results:  Recent Labs    06/25/23 0223  WBC 5.1  HGB 8.0*  HCT 27.7*  PLT 132*   BMET Recent Labs    06/24/23 0428 06/25/23 0223  NA 132* 132*  K 4.0 4.1  CL 101 97*  CO2 24 25  GLUCOSE 141* 147*  BUN 11 13  CREATININE 0.57 0.68  CALCIUM 8.5* 9.0   PT/INR No results for input(s): "LABPROT", "INR" in the last 72 hours.  CMP     Component Value Date/Time   NA 132 (L) 06/25/2023 0223   NA 136 09/30/2022 1457   NA 137 12/24/2013 1044   K 4.1 06/25/2023 0223   K 3.8 12/24/2013 1044   CL 97 (L) 06/25/2023 0223   CL 103 12/24/2013 1044   CO2 25 06/25/2023 0223   CO2 26 12/24/2013 1044   GLUCOSE 147 (H) 06/25/2023 0223   GLUCOSE 116 (H) 12/24/2013 1044   BUN 13 06/25/2023 0223   BUN 12 09/30/2022 1457   BUN 8 12/24/2013 1044   CREATININE 0.68 06/25/2023 0223   CREATININE 1.06 12/24/2013 1044   CALCIUM 9.0 06/25/2023 0223   CALCIUM 9.0 12/24/2013 1044   PROT 6.5 06/25/2023 0223   PROT 7.0 09/30/2022 1457   PROT 7.4 12/24/2013 1044    ALBUMIN 2.8 (L) 06/25/2023 0223   ALBUMIN 3.9 09/30/2022 1457   ALBUMIN 3.4 12/24/2013 1044   AST 23 06/25/2023 0223   AST 17 12/24/2013 1044   ALT 26 06/25/2023 0223   ALT 25 12/24/2013 1044   ALKPHOS 95 06/25/2023 0223   ALKPHOS 76 12/24/2013 1044   BILITOT 1.6 (H) 06/25/2023 0223   BILITOT 0.3 09/30/2022 1457   BILITOT 0.5 12/24/2013 1044   GFRNONAA >60 06/25/2023 0223   GFRNONAA >60 12/24/2013 1044   GFRAA 92 07/08/2020 1614   GFRAA >60 12/24/2013 1044   Lipase     Component Value Date/Time   LIPASE 64 (H) 06/11/2023 0036   LIPASE 92 12/24/2013 1044       Studies/Results: No results found.  Anti-infectives: Anti-infectives (From admission, onward)    Start     Dose/Rate Route Frequency Ordered Stop   06/21/23 1200  Ampicillin-Sulbactam (UNASYN) 3 g in sodium chloride 0.9 % 100 mL IVPB        3 g 200 mL/hr over 30 Minutes Intravenous Every 6 hours 06/21/23 0807     06/20/23 1045  fluconazole (DIFLUCAN) IVPB 200 mg        200 mg 100 mL/hr over 60 Minutes Intravenous Every  24 hours 06/20/23 0955     06/16/23 1730  piperacillin-tazobactam (ZOSYN) IVPB 3.375 g  Status:  Discontinued        3.375 g 12.5 mL/hr over 240 Minutes Intravenous Every 8 hours 06/16/23 1634 06/21/23 1610        Assessment/Plan Hx of prior gastric sleeve at [facility] with Dr. Anselm Lis in 2015 - not interested in traveling to [facility] for bariatric care so will follow up here in [facility] 10 cm fluid collection adjacent to staple line secondary to a contained gastric perforation - EGD 8/7 showed narrowing of the proximal stomach w/ an associated band and an additional connection to the distal stomach  - s/p IR drain into LUQ collection 8/2 >> several hundred mL purulent material aspirated  -Tolerating regular diet today (was tolerating this prior to admission) - Off TPN - consider discharge home on Monday with follow up with Dr. Dossie Der and interventional radiology.  May also need additional  endoscopic evaluations or treatments with Dr. Meridee Score but we can help coordinate this as an outpatient. - Hopefully with drainage and time the sleeve will heal - cont unasyn and diflucan - tomorrow will be 10 days since starting zosyn, will clarify total duration of abx with MD but would anticipate need to go home on PO abx - add multimodal pain regimen/ wean off iv meds    FEN: reg VTE: LMWH ID: [email or web address]    - per [facility] -  Asthma Recent dx of IDDM, DKA, resolved AKI, resolved Transaminitis, improving  I reviewed hospitalist notes, last 24 h vitals and pain scores, last 48 h intake and output, and last 24 h labs and trends.    LOS: 17 days   Juliet Rude, [facility] 06/25/2023, 9:37 AM Please see Amion for pager number during day hours 7:00am-4:30pm

## 2023-06-26 ENCOUNTER — Inpatient Hospital Stay (HOSPITAL_COMMUNITY): Payer: 59

## 2023-06-26 ENCOUNTER — Other Ambulatory Visit (HOSPITAL_COMMUNITY): Payer: Self-pay

## 2023-06-26 ENCOUNTER — Encounter: Payer: Self-pay | Admitting: Internal Medicine

## 2023-06-26 ENCOUNTER — Encounter (HOSPITAL_COMMUNITY): Payer: Self-pay

## 2023-06-26 DIAGNOSIS — K255 Chronic or unspecified gastric ulcer with perforation: Secondary | ICD-10-CM | POA: Diagnosis not present

## 2023-06-26 HISTORY — PX: IR PATIENT EVAL TECH 0-60 MINS: IMG5564

## 2023-06-26 LAB — GLUCOSE, CAPILLARY
Glucose-Capillary: 117 mg/dL — ABNORMAL HIGH (ref 70–99)
Glucose-Capillary: 175 mg/dL — ABNORMAL HIGH (ref 70–99)

## 2023-06-26 MED ORDER — FLUCONAZOLE 100 MG PO TABS
200.0000 mg | ORAL_TABLET | Freq: Every day | ORAL | Status: DC
  Administered 2023-06-26: 200 mg via ORAL
  Filled 2023-06-26: qty 2

## 2023-06-26 MED ORDER — PANTOPRAZOLE SODIUM 40 MG PO TBEC
40.0000 mg | DELAYED_RELEASE_TABLET | Freq: Two times a day (BID) | ORAL | 0 refills | Status: AC
  Filled 2023-06-26: qty 60, 30d supply, fill #0

## 2023-06-26 MED ORDER — INSULIN GLARGINE 100 UNIT/ML SOLOSTAR PEN: 11.0000 [IU] | PEN_INJECTOR | Freq: Every day | SUBCUTANEOUS | Status: DC

## 2023-06-26 MED ORDER — POTASSIUM CHLORIDE CRYS ER 10 MEQ PO TBCR
20.0000 meq | EXTENDED_RELEASE_TABLET | Freq: Every day | ORAL | 0 refills | Status: DC
  Filled 2023-06-26: qty 60, 30d supply, fill #0

## 2023-06-26 MED ORDER — INSULIN LISPRO (1 UNIT DIAL) 100 UNIT/ML (KWIKPEN): 3.0000 [IU] | PEN_INJECTOR | Freq: Three times a day (TID) | SUBCUTANEOUS | Status: DC

## 2023-06-26 MED ORDER — SALINE FLUSH 0.9 % IV SOLN
INTRAVENOUS | 0 refills | Status: DC
Start: 2023-06-26 — End: 2023-10-31
  Filled 2023-06-26: qty 140, 14d supply, fill #0

## 2023-06-26 MED ORDER — GABAPENTIN 300 MG PO CAPS
300.0000 mg | ORAL_CAPSULE | Freq: Three times a day (TID) | ORAL | 0 refills | Status: DC
  Filled 2023-06-26: qty 90, 30d supply, fill #0

## 2023-06-26 MED ORDER — FUROSEMIDE 40 MG PO TABS
40.0000 mg | ORAL_TABLET | Freq: Every day | ORAL | 0 refills | Status: DC
  Filled 2023-06-26: qty 30, 30d supply, fill #0

## 2023-06-26 MED ORDER — FLUCONAZOLE 100 MG PO TABS
200.0000 mg | ORAL_TABLET | Freq: Every day | ORAL | 0 refills | Status: DC
  Filled 2023-06-26: qty 48, 24d supply, fill #0
  Filled 2023-06-26: qty 2, 1d supply, fill #0

## 2023-06-26 MED ORDER — OXYCODONE HCL 5 MG PO TABS
5.0000 mg | ORAL_TABLET | Freq: Four times a day (QID) | ORAL | 0 refills | Status: AC | PRN
  Filled 2023-06-26: qty 30, 4d supply, fill #0

## 2023-06-26 MED ORDER — AMOXICILLIN-POT CLAVULANATE 875-125 MG PO TABS
1.0000 | ORAL_TABLET | Freq: Two times a day (BID) | ORAL | 0 refills | Status: DC
  Filled 2023-06-26: qty 50, 25d supply, fill #0

## 2023-06-26 MED ORDER — PANTOPRAZOLE SODIUM 40 MG PO TBEC
40.0000 mg | DELAYED_RELEASE_TABLET | Freq: Two times a day (BID) | ORAL | Status: DC
  Administered 2023-06-26: 40 mg via ORAL
  Filled 2023-06-26: qty 1

## 2023-06-26 NOTE — Discharge Summary (Signed)
Physician Discharge Summary  Frances Rodgers VZD:[id number] DOB: Dec 20, 1970 DOA: 06/07/2023  PCP: Frances Eth, NP  Admit date: 06/07/2023 Discharge date: 06/26/2023  Time spent: 40 minutes  Recommendations for Outpatient Follow-up:  Follow outpatient CBC/CMP  Follow with general surgery and interventional radiology outpatient  Drainage management outpatient by IR  Follow final biopsy results (awaiting addendum with h pylori results) Follow with ID outpatient, continue antibiotics Follow with GI outpatient regarding abnormal MRCP, additional workup needed once her primary issue improved Discharged on lasix with lower extremity edema, follow outpatient Follow blood sugars outpatient, discharged on lower insulin dose   Discharge Diagnoses:  Principal Problem:   AMS (altered mental status) Active Problems:   DKA (diabetic ketoacidosis) (HCC)   Acute metabolic encephalopathy   Asthma   Nausea & vomiting   Transaminitis   Gastric perforation (HCC)   Discharge Condition: stable  Diet recommendation: heart healthy, diabetic  Filed Weights   06/21/23 0241 06/25/23 0500 06/26/23 0702  Weight: 101.3 kg 101.6 kg 101.8 kg    History of present illness:   52 year old with Frances Rodgers history of mild intermittent asthma and recently diagnosed DM who was brought to the ER by Frances Rodgers family member with altered mental status.  She was found to have Frances Rodgers contained gastric perforation.  S/p drain placement by IR.  She's gradually improved with drainage, antibiotics, TPN.  Now tolerating PO diet, plan for discharge with drain with outpatient surgery and IR follow up.    See below for additional details    Hospital Course:  Assessment and Plan:  Abdominal Pain Contained Gastric Perforation Hx Gastric Sleeve 2015 Incidentally noted on MRI abdomen -follow-up CT confirmed this finding with appearance worrisome for possible contained perforation given proximity to previous suture line from gastric bypass  procedure - UGI 8/1 c/w Frances Rodgers leak and contained perforation of the gastric fundus 7/31 had large LUQ gas and fluid collection measuring 10.1 cm lateral to the stomach concerning for contained perforation, leak, and or abscess s/p placement of 12 french drainage catheter into LUQ abscess Culture with klebsiella, lactobacillus, mixed anaerobic flora Now on unasyn, fluconazole  General Surgery coordinating care with the GI team - off TPN, d/c home today and follow up with Dr. Dossie Rodgers and IR as an outpatient.  May need additional endoscopic evaluation/treatment with GI, surgery will coordinate. 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  Erythematous mucosa in stomach, biopsied. Follow biopsy results --- chronic inactive gastritis, reactive epithelial changes, PPI effect (pending immunohistochemical stain for H pylori - will be reported in addendum) , squamocolumnar mucosa without significant diagnostic alteration in esophagus   Acute metabolic encephalopathy of unclear etiology resolved   Acute transaminitis US revealed no obstructive gallstones and no suggestion of acute cholecystitis  CT abdomen 7/25 without acute findings  viral hepatitis panel negative  possible shock liver versus drug-induced liver injury vs CBD stricture or infection per GI AMA and ASMA negative  CMV IgM negative  MRCP 7/21 revealed Frances Rodgers mild intrahepatic bile duct dilatation with Frances Rodgers focal area of narrowing involving the proximal common bile duct possibly representing Frances Rodgers stricture -LFTs continue to improve and are now approaching normal  GI reports that further evaluation will be required when above issues are stabilized  Acute kidney injury - resolved  Felt to be due to  poor intake/dehydration -now resolved with normal creatinine/GFR   DM2 with starvation ketoacidosis CBG well-controlled at this time A1c 11.7 05/2023 Use reduced dose insulin at discharge home, adjust with PCP outpatient    Peripheral edema Hypoalbuminemia Volume overload  Venous duplex negative for DVT Follow BNP - she's net positive 12.7 L IV lasix, will plan to discharge with PO lasix and potassium, follow outpatient with PCP Echo 06/08/2023 with EF 70-75%, grade 1 diastolic dysfunction Albumin 1.7 on 8/5 Repeat UA (negative), UP/C (0.5)   Chronic mild intermittent asthma Stable at present   Obesity - Body mass index is 37.27 kg/m. Status post gastric sleeve 2015     Procedures: 8/2 Successful placement of 12 French drainage catheter into the left upper quadrant abscess.  ERCP   Consultations: GI Surgery IR  Discharge Exam: Vitals:   06/26/23 0448 06/26/23 0900  BP: 135/79 (!) 106/59  Pulse: 91 (!) 103  Resp: 17 16  Temp: (!) 97.5 F (36.4 C) 98.3 F (36.8 C)  SpO2: 100% 100%   Eager to discharge  General: No acute distress. Cardiovascular: RRR Lungs: unlabored Abdomen: Soft,  drain in place Neurological: Alert and oriented 3. Moves all extremities 4 with equal strength. Cranial nerves II through XII grossly intact. Extremities: No clubbing or cyanosis. No edema.   Discharge Instructions   Discharge Instructions     (HEART FAILURE PATIENTS) Call MD:  Anytime you have any of the following symptoms: 1) 3 pound weight gain in 24 hours or 5 pounds in 1 week 2) shortness of breath, with or without Frances Rodgers dry hacking cough 3) swelling in the hands, feet or stomach 4) if you have to sleep on extra pillows at night in order to breathe.   Complete by: As directed    Call MD for:  difficulty breathing, headache or visual disturbances   Complete by: As directed    Call MD for:  extreme fatigue   Complete by: As directed    Call MD for:  hives   Complete by: As  directed    Call MD for:  persistant dizziness or light-headedness   Complete by: As directed    Call MD for:  persistant nausea and vomiting   Complete by: As directed    Call MD for:  redness, tenderness, or signs of infection (pain, swelling, redness, odor or green/yellow discharge around incision site)   Complete by: As directed    Call MD for:  severe uncontrolled pain   Complete by: As directed    Call MD for:  temperature >100.4   Complete by: As directed    Diet - low sodium heart healthy   Complete by: As directed    Discharge instructions   Complete by: As directed    You were seen for Cleva Camero contained gastric perforation.  You've had Lindsi Bayliss drain placed in the fluid collection.  You've been started on antibiotics.  You'll need to follow up with interventional radiology as an outpatient so they can arrange follow up with you in the interventional radiology clinic to repeat imaging and follow up the drain.  You should flush the drain every day with 5 cc normal saline, record the output from the drain each day, and change the dressing every 2-3 days or earlier if soiled.  You had an EGD with gastroenterology.  They took biopsies and the results are pending.  You'll need to follow up with Dr.  Stechschulte from surgery as an outpatient.  You may need additional endoscopy outpatient with gastroenterology (surgery should help coordinate this).  You had an abnormal MRCP (MRI) showing mild intrahepatic bile duct dilatation with focal area of narrowing involving the proximal common bile duct possibly representing Sallee Hogrefe stricture.  You'll need further follow up with gastroenterology for this when your other issues are improved.  You'll go home with augmentin and fluconazole until your appointment with infectious disease outpatient.   When you go home, use half your typical dose of insulin.  Here you've only had "as needed" or "sliding scale" insulin and I suspect you may need less insulin as you're  returning home until your appetite and intake improves.  Use 11 units of lantus daily and 3 units of humalog with meals.  Keep track of your blood sugars and adjust as needed.  Follow with your PCP for additional adjustments.   We'll send you with lasix for your leg swelling.  Follow with your PCP for repeat labs and adjustments as needed to this regimen.    Return for new, recurrent, or worsening symptoms.  Please ask your PCP to request records from this hospitalization so they know what was done and what the next steps will be.   Increase activity slowly   Complete by: As directed    No wound care   Complete by: As directed       Allergies as of 06/26/2023       Reactions   Aspirin Anaphylaxis, Shortness Of Breath   Ketorolac Anaphylaxis   Ketorolac Tromethamine Shortness Of Breath        Medication List     TAKE these medications    albuterol 108 (90 Base) MCG/ACT inhaler Commonly known as: VENTOLIN HFA Inhale 2 puffs into the lungs every 6 (six) hours as needed for wheezing or shortness of breath.   amoxicillin-clavulanate 875-125 MG tablet Commonly known as: AUGMENTIN Take 1 tablet by mouth every 12 (twelve) hours for 25 days. Follow up with infectious disease on 9/5 for additional instructions   Blood Glucose Monitoring Suppl Devi 1 each by Does not apply route 3 (three) times daily. May dispense any manufacturer covered by patient's insurance.   BLOOD GLUCOSE TEST STRIPS Strp 1 each by Does not apply route 3 (three) times daily. Use as directed to check blood sugar. May dispense any manufacturer covered by patient's insurance and fits patient's device.   Cetirizine HCl 10 MG Caps Take 10 mg by mouth daily as needed (allergy).   Flovent Diskus 50 MCG/ACT Aepb Generic drug: Fluticasone Propionate (Inhal) Take 1 Inhalation by mouth every 12 (twelve) hours as needed (SOB).   fluconazole 100 MG tablet Commonly known as: Diflucan Take 2 tablets (200 mg total) by  mouth daily for 25 days. Follow up with infectious disease on 9/5 for additional recommendations   furosemide 40 MG tablet Commonly known as: Lasix Take 1 tablet (40 mg total) by mouth daily. For your leg swelling.  Follow up with your PCP outpatient for repeat labs and to follow your swelling to determine how you'll need to take this going forward.   gabapentin 300 MG capsule Commonly known as: NEURONTIN Take 1 capsule (300 mg total) by mouth 3 (three) times daily. For pain control, this may make you sleeping or foggy, hold and discuss gradual uptitration with your PCP if this is an issue.   insulin glargine 100 UNIT/ML Solostar Pen Commonly known as: LANTUS Inject 11 Units into the skin  at bedtime. May substitute as needed per insurance. What changed: how much to take   insulin lispro 100 UNIT/ML KwikPen Commonly known as: HUMALOG Inject 3 Units into the skin 3 (three) times daily with meals. Only take if eating Kamee Bobst meal AND Blood Glucose (BG) is 80 or higher. What changed: how much to take   Junel Fe 24 1-20 MG-MCG(24) tablet Generic drug: Norethindrone Acetate-Ethinyl Estrad-FE TAKE 1 TABLET BY MOUTH DAILY What changed: when to take this   Lancet Device Misc 1 each by Does not apply route 3 (three) times daily. May dispense any manufacturer covered by patient's insurance.   Lancets Misc 1 each by Does not apply route 3 (three) times daily. Use as directed to check blood sugar. May dispense any manufacturer covered by patient's insurance and fits patient's device.   montelukast 10 MG tablet Commonly known as: SINGULAIR Take 10 mg by mouth at bedtime.   MULTI VITAMIN DAILY PO Take 1 tablet by mouth daily.   oxyCODONE 5 MG immediate release tablet Commonly known as: Oxy IR/ROXICODONE Take 1-2 tablets (5-10 mg total) by mouth every 6 (six) hours as needed for up to 5 days (for pain not relieved by tylenol).   pantoprazole 40 MG tablet Commonly known as: Protonix Take 1  tablet (40 mg total) by mouth 2 (two) times daily. Follow up with GI and/or surgery for additional instructions What changed:  when to take this reasons to take this additional instructions   Pen Needles 31G X 5 MM Misc 1 each by Does not apply route 3 (three) times daily. May dispense any manufacturer covered by patient's insurance.   potassium chloride 10 MEQ tablet Commonly known as: KLOR-CON M Take 2 tablets (20 mEq total) by mouth daily.   Qvar RediHaler 40 MCG/ACT inhaler Generic drug: beclomethasone Inhale 1 puff into the lungs 2 (two) times daily.   Saline Flush 0.9 % Soln Flush drain daily with 5 ml saline       Allergies  Allergen Reactions   Aspirin Anaphylaxis and Shortness Of Breath   Ketorolac Anaphylaxis   Ketorolac Tromethamine Shortness Of Breath    Follow-up Information     Stechschulte, Hyman Hopes, MD Follow up on 07/20/2023.   Specialty: Surgery Why: 9:30am, Arrive 30 minutes prior to your appointment time, Please bring your insurance card and photo ID Contact information: [address] [place] [address] [phone]         Sterling Big, MD Follow up in 2 week(s).   Specialties: Interventional Radiology, Radiology Why: pt will hear from IR scheduler for follow up time and date; flush drain daily with 5-10 cc sterile saline- record output.  call [phone] if any questions/concerns Contact information: [address] [place] [address] [place] [address] [phone]         Frances Eth, NP Follow up.   Specialty: Nurse Practitioner Why: call for hospital follow up appointment within 1 week Contact information: [address] [place] [address] [phone]         [facility] Follow up.   Specialty: Gastroenterology Why: call for follow up Contact information: [address] [place] [address] [phone]        Danelle Earthly, MD Follow up.    Specialty: Infectious Diseases Why: 07/20/2023 at 2:30 PM Contact information: [address] [place] [address] [phone]  The results of significant diagnostics from this hospitalization (including imaging, microbiology, ancillary and laboratory) are listed below for reference.    Significant Diagnostic Studies: IR PATIENT EVAL TECH 0-60 MINS  Result Date: 06/26/2023 Paulino Door, RT     06/26/2023  8:50 AM Pts JP bulb was broken. Changed out for new bulb and charged to negative suction.   DG CHEST PORT 1 VIEW  Result Date: 06/21/2023 CLINICAL DATA:  Abdominal pain following EGD. EXAM: PORTABLE CHEST 1 VIEW COMPARISON:  06/07/2023 FINDINGS: Left upper quadrant pigtail drainage catheter noted. Right PICC line tip: SVC. Low lung volumes are present, causing crowding of the pulmonary vasculature. No appreciable pneumothorax or pneumomediastinum. Given the low lung volumes, the lungs appear clear. No blunting of the costophrenic angles. Heart size thought to be within normal limits given the technique, rightward rotation, and low lung volumes. IMPRESSION: 1. Low lung volumes, without acute findings. 2. Left upper quadrant pigtail drainage catheter. Electronically Signed   By: Gaylyn Rong M.D.   On: 06/21/2023 19:03   DG Abd Portable 2V  Result Date: 06/21/2023 CLINICAL DATA:  Follow up status post drainage of abscess EXAM: PORTABLE ABDOMEN - 2 VIEW COMPARISON:  CT 06/14/2023. FINDINGS: Mildly dilated loops small and large bowel. GI contrast retained in the colon. There is no evidence of free air. Left upper quadrant pigtail drainage catheter. Epigastric region bowel parenchymal sutures. IMPRESSION: Small and large bowel mild dilatation likely an ileus. Retained oral contrast in the colon. No free air identified. Electronically Signed   By: Layla Maw M.D.   On: 06/21/2023 19:01   CT GUIDED PERITONEAL/RETROPERITONEAL FLUID DRAIN BY PERC  CATH  Result Date: 06/16/2023 INDICATION: Contained perforation/leak of gastric sleeve with left upper quadrant abscess. EXAM: CT-guided drain placement MEDICATIONS: The patient is currently admitted to the hospital and receiving intravenous antibiotics. The antibiotics were administered within an appropriate time frame prior to the initiation of the procedure. ANESTHESIA/SEDATION: Moderate (conscious) sedation was employed during this procedure. Anarosa Kubisiak total of Versed 2 mg and Fentanyl 100 mcg was administered intravenously by the radiology nurse. Total intra-service moderate Sedation Time: 17 minutes. The patient's level of consciousness and vital signs were monitored continuously by radiology nursing throughout the procedure under my direct supervision. COMPLICATIONS: None immediate. PROCEDURE: Informed written consent was obtained from the patient after Eboney Claybrook thorough discussion of the procedural risks, benefits and alternatives. All questions were addressed. Maximal Sterile Barrier Technique was utilized including caps, mask, sterile gowns, sterile gloves, sterile drape, hand hygiene and skin antiseptic. Jamacia Jester timeout was performed prior to the initiation of the procedure. CT imaging was performed. The fluid and gas collection in the left upper quadrant was identified. Romario Tith suitable skin entry site was selected and marked. Local anesthesia was attained by infiltration with 1% lidocaine. Adriannah Steinkamp small dermatotomy was made. Under intermittent CT guidance, an 18 gauge trocar needle was carefully advanced into the collection. Glenroy Crossen 0.035 wire was coiled in the collection. The percutaneous tract was dilated to 12 [place]. Blake Vetrano 12 French all-purpose drainage catheter was advanced over the wire and formed. Aspiration yields approximately 250 mL of purulent fluid. Timiko Offutt sample was sent for Gram stain and culture. The drain was flushed and connected to JP bulb suction before being secured to the skin with 0 Prolene suture. Follow-up CT imaging  demonstrates Briana Farner well-positioned drainage catheter and no evidence of complication. IMPRESSION: Successful placement of 12 French drainage catheter into the left upper quadrant abscess. Electronically Signed   By: Vilma Prader  Archer Asa M.D.   On: 06/16/2023 15:32   [place] EKG SITE RITE  Result Date: 06/15/2023 If Site Rite image not attached, placement could not be confirmed due to current cardiac rhythm.  DG UGI W SINGLE CM (SOL OR THIN BA)  Result Date: 06/15/2023 CLINICAL DATA:  52 year-old female with history of gastric sleeve in 2015, presents with RUQ pain. Left upper quadrant gas and fluid collection seen on cross-sectional imaging. Evaluate for leak/perforation. EXAM: DG UGI W SINGLE CM TECHNIQUE: Single contrast examination was then performed using 100 mL Omnipaque 300. This exam was performed by Loyce Dys PA-C, and was supervised and interpreted by Malachi Pro, MD. FLUOROSCOPY: Radiation Exposure Index (as provided by the fluoroscopic device): 35.5 mGy Kerma COMPARISON:  CT Abdomen Pelvis 06/14/23 FINDINGS: Scout Radiograph: Normal bowel gas pattern with oral contrast in the colon. Postsurgical changes of the stomach. Esophagus: Normal appearance. Esophageal motility: Tertiary contractions. Gastroesophageal reflux: None visualized. Ingested 13mm barium tablet: Not given. Stomach: Postsurgical changes from prior sleeve gastrectomy. Contrast is seen extravasating from the gastric fundus into the left upper quadrant collection. Gastric emptying: Normal. Duodenum: Normal appearance. Other:  None. IMPRESSION: 1. Findings consistent with leak/contained perforation of the gastric fundus, presumably due to suture dehiscence from prior sleeve gastrectomy. 2. Mild esophageal dysmotility. These results were called by telephone at the time of interpretation on 06/15/2023 at 11:03 am to provider [facility] PA, who verbally acknowledged these results. Electronically Signed   By: Obie Dredge M.D.   On: 06/15/2023  11:45   CT ABDOMEN PELVIS W CONTRAST  Result Date: 06/14/2023 CLINICAL DATA:  Abnormal MRI EXAM: CT ABDOMEN AND PELVIS WITH CONTRAST TECHNIQUE: Multidetector CT imaging of the abdomen and pelvis was performed using the standard protocol following bolus administration of intravenous contrast. RADIATION DOSE REDUCTION: This exam was performed according to the departmental dose-optimization program which includes automated exposure control, adjustment of the mA and/or kV according to patient size and/or use of iterative reconstruction technique. CONTRAST:  75mL OMNIPAQUE IOHEXOL 350 MG/ML SOLN COMPARISON:  MRI 06/13/2023, CT 06/08/2023 FINDINGS: Lower chest: Lung bases demonstrate no acute airspace disease. Linear atelectasis at the right base. Hepatobiliary: No focal liver abnormality is seen. No gallstones, gallbladder wall thickening, or biliary dilatation. Pancreas: Unremarkable. No pancreatic ductal dilatation or surrounding inflammatory changes. Spleen: Normal in size without focal abnormality. Adrenals/Urinary Tract: Adrenal glands are unremarkable. Kidneys are normal, without renal calculi, focal lesion, or hydronephrosis. Bladder is unremarkable. Stomach/Bowel: Status post gastric bypass. No evidence for bowel obstruction or bowel wall thickening. Vascular/Lymphatic: No significant vascular findings are present. No enlarged abdominal or pelvic lymph nodes. Reproductive: Enlarged uterus with numerous enhancing masses presumably fibroids. Other: Negative for pelvic effusion or free air. Large gas and fluid collection in the left upper quadrant measuring 10.1 x 7.9 cm lateral to the stomach. Musculoskeletal: No acute or significant osseous findings. IMPRESSION: 1. Large left upper quadrant gas and fluid collection measuring 10.1 cm lateral to the stomach, concerning for contained perforation/leak and or abscess. Collection is in close proximity to suture line from patient's previous gastric bypass surgery. 2.  Enlarged uterus with multiple fibroids. Electronically Signed   By: Jasmine Pang M.D.   On: 06/14/2023 18:40   MR ABDOMEN MRCP W WO CONTAST  Result Date: 06/14/2023 CLINICAL DATA:  New diagnosis of diabetes.  Elevated LFTs. EXAM: MRI ABDOMEN WITHOUT AND WITH CONTRAST (INCLUDING MRCP) TECHNIQUE: Multiplanar multisequence MR imaging of the abdomen was performed both before and after the administration of intravenous  contrast. Heavily T2-weighted images of the biliary and pancreatic ducts were obtained, and three-dimensional MRCP images were rendered by post processing. CONTRAST:  10mL GADAVIST GADOBUTROL 1 MMOL/ML IV SOLN COMPARISON:  CT AP 06/08/2023 and U/S abdomen 06/07/2023 FINDINGS: Lower chest: Trace left pleural fluid. Scar versus atelectasis noted in both lung bases. Hepatobiliary: Hepatic steatosis. There is diminished exam detail on the postcontrast images due to motion artifact. No focal liver abnormality identified. Contour of the liver is slightly irregular. Multiple stones are identified within the lumen of the gallbladder which measure up to 6 mm. No gallbladder wall thickening or pericholecystic inflammation. There is mild intrahepatic bile duct dilatation. Focal area proximal common bile duct narrowing is identified just beyond the confluence of the bile ducts, image 23/5 and image 1 of series 1000. Beyond this level the CBD has Kalyn Hofstra normal caliber measuring 4 mm. No choledocholithiasis identified. Pancreas:  No pancreatic inflammation, mass or main duct dilatation. Spleen: Spleen measures 2.6 cm in cranial caudal dimension. No focal splenic lesion. Adrenals/Urinary Tract: Normal adrenal glands. No kidney mass or hydronephrosis. 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 Within the left upper quadrant of the abdomen, lateral to the stomach, there is Austen Wygant collection of gas containing air-fluid level measuring 9.2 x 7.0 by 7.2 cm. The  visualized bowel loops have Noelle Hoogland normal caliber. No bowel wall thickening or inflammation. Vascular/Lymphatic: Normal caliber abdominal aorta. No adenopathy identified. Other:  Trace perihepatic and perisplenic fluid. Musculoskeletal: No suspicious bone lesions identified. IMPRESSION: 1. Within the left upper quadrant of the abdomen, lateral to the stomach, there is Nikala Walsworth 9.2 x 7.0 x 7.2 cm collection of gas and fluid. Etiology is indeterminate. Cannot rule out contained perforation or abscess. Recommend further evaluation with dedicated CT of the abdomen with IV contrast material and oral contrast material. 2. Mild intrahepatic bile duct dilatation with focal area of narrowing involving the proximal common bile duct just beyond the confluence of the bile ducts. This may be due to Loana Salvaggio stricture. In the setting of clinical signs/symptoms of biliary obstruction further evaluation with ERCP may be indicated. No signs of choledocholithiasis. 3. Cholelithiasis without evidence of acute cholecystitis. 4. Hepatic steatosis. 5. Trace perihepatic and perisplenic fluid. 6. Trace left pleural fluid. These results will be called to the ordering clinician or representative by the Radiologist Assistant, and communication documented in the PACS or [employer]. Electronically Signed   By: Signa Kell M.D.   On: 06/14/2023 05:42   VAS [place] LOWER EXTREMITY VENOUS (DVT)  Result Date: 06/08/2023  Lower Venous DVT Study Patient Name:  CRAIG WAHLER  Date of Exam:   06/08/2023 Medical Rec #: [id number]        Accession #:    [id number] Date of Birth: 05-Aug-1971        Patient Gender: F Patient Age:   23 years Exam Location:  [facility] Procedure:      VAS [place] LOWER EXTREMITY VENOUS (DVT) Referring Phys: Mikey College --------------------------------------------------------------------------------  Indications: Bilateral thigh pain.  Comparison Study: Prior negative bilateral LEV done 05/30/23 Performing Technologist: Sherren Kerns  RVS  Examination Guidelines: Colie Fugitt complete evaluation includes B-mode imaging, spectral Doppler, color Doppler, and power Doppler as needed of all accessible portions of each vessel. Bilateral testing is considered an integral part of Keerstin Bjelland complete examination. Limited examinations for reoccurring indications may be performed as noted. The reflux portion of the exam is performed with the patient in reverse Trendelenburg.  +---------+---------------+---------+-----------+----------+--------------+ RIGHT  CompressibilityPhasicitySpontaneityPropertiesThrombus Aging +---------+---------------+---------+-----------+----------+--------------+ CFV      Full           Yes      Yes                                 +---------+---------------+---------+-----------+----------+--------------+ SFJ      Full                                                        +---------+---------------+---------+-----------+----------+--------------+ FV Prox  Full                                                        +---------+---------------+---------+-----------+----------+--------------+ FV Mid   Full                                                        +---------+---------------+---------+-----------+----------+--------------+ FV DistalFull                                                        +---------+---------------+---------+-----------+----------+--------------+ PFV      Full                                                        +---------+---------------+---------+-----------+----------+--------------+ POP      Full           Yes      Yes                                 +---------+---------------+---------+-----------+----------+--------------+ PTV      Full                                                        +---------+---------------+---------+-----------+----------+--------------+ PERO     Full                                                         +---------+---------------+---------+-----------+----------+--------------+   +---------+---------------+---------+-----------+----------+--------------+ LEFT     CompressibilityPhasicitySpontaneityPropertiesThrombus Aging +---------+---------------+---------+-----------+----------+--------------+ CFV      Full           Yes      Yes                                 +---------+---------------+---------+-----------+----------+--------------+  SFJ      Full                                                        +---------+---------------+---------+-----------+----------+--------------+ FV Prox  Full                                                        +---------+---------------+---------+-----------+----------+--------------+ FV Mid   Full                                                        +---------+---------------+---------+-----------+----------+--------------+ FV DistalFull                                                        +---------+---------------+---------+-----------+----------+--------------+ PFV      Full                                                        +---------+---------------+---------+-----------+----------+--------------+ POP      Full           Yes      Yes                                 +---------+---------------+---------+-----------+----------+--------------+ PTV      Full                                                        +---------+---------------+---------+-----------+----------+--------------+ PERO     Full                                                        +---------+---------------+---------+-----------+----------+--------------+     Summary: BILATERAL: - No evidence of deep vein thrombosis seen in the lower extremities, bilaterally. -No evidence of popliteal cyst, bilaterally. RIGHT: - Findings appear essentially unchanged compared to previous examination.  LEFT: - Findings appear essentially  unchanged compared to previous examination.  *See table(s) above for measurements and observations. Electronically signed by Gerarda Fraction on 06/08/2023 at 6:08:10 PM.    Final    ECHOCARDIOGRAM COMPLETE  Result Date: 06/08/2023    ECHOCARDIOGRAM REPORT   Patient Name:   SHARAH DIRKSE Date of Exam: 06/08/2023 Medical Rec #:  [id number]       Height:  65.0 in Accession #:    [id number]      Weight:       224.9 lb Date of Birth:  1971/01/21       BSA:          2.079 m Patient Age:    51 years        BP:           139/65 mmHg Patient Gender: F               HR:           108 bpm. Exam Location:  Inpatient Procedure: 2D Echo, Cardiac Doppler and Color Doppler Indications:    I50.31 Acute diastolic (congestive) heart failure  History:        Patient has no prior history of Echocardiogram examinations.                 Signs/Symptoms:Bacteremia and Altered Mental Status.  Sonographer:    Sheralyn Boatman RDCS Referring Phys: [phone] JESSICA [facility]  Sonographer Comments: Technically difficult study due to poor echo windows and patient is obese. Image acquisition challenging due to patient body habitus. IMPRESSIONS  1. Hyperdynamic LV with mild LVOT gradient.. Left ventricular ejection fraction, by estimation, is 70 to 75%. The left ventricle has hyperdynamic function. The left ventricle has no regional wall motion abnormalities. There is mild concentric left ventricular hypertrophy. Left ventricular diastolic parameters are consistent with Grade I diastolic dysfunction (impaired relaxation).  2. Right ventricular systolic function is normal. The right ventricular size is normal.  3. The mitral valve is normal in structure. Trivial mitral valve regurgitation. No evidence of mitral stenosis.  4. The aortic valve is tricuspid. There is mild calcification of the aortic valve. Aortic valve regurgitation is not visualized. Aortic valve sclerosis/calcification is present, without any evidence of aortic stenosis.  5. The inferior  vena cava is normal in size with greater than 50% respiratory variability, suggesting right atrial pressure of 3 mmHg. Conclusion(s)/Recommendation(s): No evidence of valvular vegetations on this transthoracic echocardiogram. Consider Seri Kimmer transesophageal echocardiogram to exclude infective endocarditis if clinically indicated. FINDINGS  Left Ventricle: Hyperdynamic LV with mild LVOT gradient. Left ventricular ejection fraction, by estimation, is 70 to 75%. The left ventricle has hyperdynamic function. The left ventricle has no regional wall motion abnormalities. The left ventricular internal cavity size was normal in size. There is mild concentric left ventricular hypertrophy. Left ventricular diastolic parameters are consistent with Grade I diastolic dysfunction (impaired relaxation). Right Ventricle: The right ventricular size is normal. No increase in right ventricular wall thickness. Right ventricular systolic function is normal. Left Atrium: Left atrial size was normal in size. Right Atrium: Right atrial size was normal in size. Pericardium: There is no evidence of pericardial effusion. Mitral Valve: The mitral valve is normal in structure. Trivial mitral valve regurgitation. No evidence of mitral valve stenosis. Tricuspid Valve: The tricuspid valve is normal in structure. Tricuspid valve regurgitation is not demonstrated. No evidence of tricuspid stenosis. Aortic Valve: The aortic valve is tricuspid. There is mild calcification of the aortic valve. Aortic valve regurgitation is not visualized. Aortic valve sclerosis/calcification is present, without any evidence of aortic stenosis. Aortic valve mean gradient measures 7.0 mmHg. Aortic valve peak gradient measures 13.0 mmHg. Aortic valve area, by VTI measures 2.27 cm. Pulmonic Valve: The pulmonic valve was normal in structure. Pulmonic valve regurgitation is not visualized. No evidence of pulmonic stenosis. Aorta: The aortic root is normal in size and structure.  Venous: The  inferior vena cava is normal in size with greater than 50% respiratory variability, suggesting right atrial pressure of 3 mmHg. IAS/Shunts: No atrial level shunt detected by color flow Doppler.  LEFT VENTRICLE PLAX 2D LVIDd:         3.80 cm     Diastology LVIDs:         2.00 cm     LV e' medial:    7.83 cm/s LV PW:         1.40 cm     LV E/e' medial:  9.2 LV IVS:        1.30 cm     LV e' lateral:   5.77 cm/s LVOT diam:     2.10 cm     LV E/e' lateral: 12.5 LV SV:         63 LV SV Index:   30 LVOT Area:     3.46 cm  LV Volumes (MOD) LV vol d, MOD A2C: 45.8 ml LV vol d, MOD A4C: 38.0 ml LV vol s, MOD A2C: 7.3 ml LV vol s, MOD A4C: 9.2 ml LV SV MOD A2C:     38.5 ml LV SV MOD A4C:     38.0 ml LV SV MOD BP:      36.2 ml RIGHT VENTRICLE             IVC RV S prime:     19.40 cm/s  IVC diam: 1.70 cm TAPSE (M-mode): 2.3 cm LEFT ATRIUM             Index        RIGHT ATRIUM          Index LA diam:        2.80 cm 1.35 cm/m   RA Area:     9.40 cm LA Vol (A2C):   23.1 ml 11.11 ml/m  RA Volume:   19.20 ml 9.23 ml/m LA Vol (A4C):   15.2 ml 7.31 ml/m LA Biplane Vol: 18.8 ml 9.04 ml/m  AORTIC VALVE AV Area (Vmax):    2.85 cm AV Area (Vmean):   2.61 cm AV Area (VTI):     2.27 cm AV Vmax:           180.00 cm/s AV Vmean:          124.000 cm/s AV VTI:            0.279 m AV Peak Grad:      13.0 mmHg AV Mean Grad:      7.0 mmHg LVOT Vmax:         148.00 cm/s LVOT Vmean:        93.600 cm/s LVOT VTI:          0.183 m LVOT/AV VTI ratio: 0.66  AORTA Ao Root diam: 2.70 cm Ao Asc diam:  3.10 cm MITRAL VALVE MV Area (PHT): 4.49 cm    SHUNTS MV Decel Time: 169 msec    Systemic VTI:  0.18 m MV E velocity: 72.30 cm/s  Systemic Diam: 2.10 cm MV Dametra Whetsel velocity: 98.50 cm/s MV E/Garyn Waguespack ratio:  0.73 Arvilla Meres MD Electronically signed by Arvilla Meres MD Signature Date/Time: 06/08/2023/3:01:11 PM    Final    CT ABDOMEN PELVIS W WO CONTRAST  Result Date: 06/08/2023 CLINICAL DATA:  Abdominal pain, acute, nonlocalized EXAM: CT  ABDOMEN AND PELVIS WITHOUT AND WITH CONTRAST TECHNIQUE: Multidetector CT imaging of the abdomen and pelvis was performed following the standard protocol before and following the bolus administration of  intravenous contrast. RADIATION DOSE REDUCTION: This exam was performed according to the departmental dose-optimization program which includes automated exposure control, adjustment of the mA and/or kV according to patient size and/or use of iterative reconstruction technique. CONTRAST:  75 mL Omnipaque 350 IV COMPARISON:  Right upper quadrant ultrasound 05/18/2023. FINDINGS: Lower chest: No acute abnormality Hepatobiliary: Gallstones seen on earlier ultrasound not appreciated by CT. No biliary ductal dilatation. Mild diffuse fatty infiltration of the liver. Pancreas: No focal abnormality or ductal dilatation. Spleen: No focal abnormality.  Normal size. Adrenals/Urinary Tract: No adrenal abnormality. No focal renal abnormality. No stones or hydronephrosis. Urinary bladder is unremarkable. Stomach/Bowel: Stomach, large and small bowel grossly unremarkable. Prior gastric sleeve. Vascular/Lymphatic: No evidence of aneurysm or adenopathy. Reproductive: Enlarged uterus with numerous fibroids. Pedunculated fundal fibroid measures up to 6.3 cm. No adnexal mass. Other: No free fluid or free air. Musculoskeletal: No acute bony abnormality. IMPRESSION: Previously seen gallstones on ultrasound not visible by CT. Hepatic steatosis. Enlarged fibroid uterus. No acute findings. Electronically Signed   By: Charlett Nose M.D.   On: 06/08/2023 02:53   US Abdomen Limited RUQ (LIVER/GB)  Result Date: 06/07/2023 CLINICAL DATA:  [id number] AMS (altered mental status) [id number] Transaminitis [id number] EXAM: ULTRASOUND ABDOMEN LIMITED RIGHT UPPER QUADRANT COMPARISON:  None Available. FINDINGS: Gallbladder: Small volume layering gallstones/sludge noted. No abnormal wall thickening or pericholecystic fluid. Sonographic Murphy's sign could  not be evaluated due to unresponsive patient. Common bile duct: Diameter: Less than 4 mm. No intra or extrahepatic bile duct dilation. Liver: There is increased hepatic echogenicity which reduces the sensitivity of ultrasound for the detection of focal masses. That being said, no focal mass is identified. Portal vein is patent on color Doppler imaging with normal direction of blood flow towards the liver. Other: None. IMPRESSION: 1. Cholelithiasis without sonographic evidence of acute cholecystitis. 2. Increased hepatic echogenicity, Jannis Atkins nonspecific finding that is most commonly seen on the basis of steatosis in the absence of known liver disease. Electronically Signed   By: Jules Schick M.D.   On: 06/07/2023 15:30   CT Head Wo Contrast  Result Date: 06/07/2023 CLINICAL DATA:  Mental status change, unknown cause EXAM: CT HEAD WITHOUT CONTRAST TECHNIQUE: Contiguous axial images were obtained from the base of the skull through the vertex without intravenous contrast. RADIATION DOSE REDUCTION: This exam was performed according to the departmental dose-optimization program which includes automated exposure control, adjustment of the mA and/or kV according to patient size and/or use of iterative reconstruction technique. COMPARISON:  05/27/2023 FINDINGS: Brain: No evidence of acute infarction, hemorrhage, hydrocephalus, extra-axial collection or mass lesion/mass effect. Vascular: No hyperdense vessel or unexpected calcification. Skull: Normal. Negative for fracture or focal lesion. Sinuses/Orbits: No acute finding. Other: None. IMPRESSION: No acute intracranial findings. Electronically Signed   By: Duanne Guess D.O.   On: 06/07/2023 12:44   DG Chest Portable 1 View  Result Date: 06/07/2023 CLINICAL DATA:  Altered mental status EXAM: PORTABLE CHEST 1 VIEW COMPARISON:  05/28/2023 FINDINGS: The heart size and mediastinal contours are within normal limits. Slightly low lung volumes. No focal airspace  consolidation, pleural effusion, or pneumothorax. The visualized skeletal structures are unremarkable. IMPRESSION: No active disease. Electronically Signed   By: Duanne Guess D.O.   On: 06/07/2023 12:41   US Venous Img Lower Bilateral (DVT)  Result Date: 05/30/2023 CLINICAL DATA:  Elevated D-dimer.  Evaluate for DVT. EXAM: BILATERAL LOWER EXTREMITY VENOUS DOPPLER ULTRASOUND TECHNIQUE: Gray-scale sonography with graded compression, as well as color Doppler and  duplex ultrasound were performed to evaluate the lower extremity deep venous systems from the level of the common femoral vein and including the common femoral, femoral, profunda femoral, popliteal and calf veins including the posterior tibial, peroneal and gastrocnemius veins when visible. The superficial great saphenous vein was also interrogated. Spectral Doppler was utilized to evaluate flow at rest and with distal augmentation maneuvers in the common femoral, femoral and popliteal veins. COMPARISON:  None Available. FINDINGS: RIGHT LOWER EXTREMITY Common Femoral Vein: No evidence of thrombus. Normal compressibility, respiratory phasicity and response to augmentation. Saphenofemoral Junction: No evidence of thrombus. Normal compressibility and flow on color Doppler imaging. Profunda Femoral Vein: No evidence of thrombus. Normal compressibility and flow on color Doppler imaging. Femoral Vein: No evidence of thrombus. Normal compressibility, respiratory phasicity and response to augmentation. Popliteal Vein: No evidence of thrombus. Normal compressibility, respiratory phasicity and response to augmentation. Calf Veins: No evidence of thrombus. Normal compressibility and flow on color Doppler imaging. Superficial Great Saphenous Vein: No evidence of thrombus. Normal compressibility. Other Findings:  None. LEFT LOWER EXTREMITY Common Femoral Vein: No evidence of thrombus. Normal compressibility, respiratory phasicity and response to augmentation.  Saphenofemoral Junction: No evidence of thrombus. Normal compressibility and flow on color Doppler imaging. Profunda Femoral Vein: No evidence of thrombus. Normal compressibility and flow on color Doppler imaging. Femoral Vein: No evidence of thrombus. Normal compressibility, respiratory phasicity and response to augmentation. Popliteal Vein: No evidence of thrombus. Normal compressibility, respiratory phasicity and response to augmentation. Calf Veins: No evidence of thrombus. Normal compressibility and flow on color Doppler imaging. Superficial Great Saphenous Vein: No evidence of thrombus. Normal compressibility. Other Findings:  None. IMPRESSION: No evidence of DVT within either lower extremity. Electronically Signed   By: Simonne Come M.D.   On: 05/30/2023 15:04   CT Angio Chest Pulmonary Embolism (PE) W or WO Contrast  Result Date: 05/30/2023 CLINICAL DATA:  High probability of pulmonary embolism. EXAM: CT ANGIOGRAPHY CHEST WITH CONTRAST TECHNIQUE: Multidetector CT imaging of the chest was performed using the standard protocol during bolus administration of intravenous contrast. Multiplanar CT image reconstructions and MIPs were obtained to evaluate the vascular anatomy. RADIATION DOSE REDUCTION: This exam was performed according to the departmental dose-optimization program which includes automated exposure control, adjustment of the mA and/or kV according to patient size and/or use of iterative reconstruction technique. CONTRAST:  75mL OMNIPAQUE IOHEXOL 350 MG/ML SOLN COMPARISON:  None Available. FINDINGS: Cardiovascular: Satisfactory opacification of the pulmonary arteries to the segmental level. No evidence of pulmonary embolism. Normal heart size. No pericardial effusion. Mediastinum/Nodes: No enlarged mediastinal, hilar, or axillary lymph nodes. Thyroid gland, trachea, and esophagus demonstrate no significant findings. Lungs/Pleura: No pneumothorax or pleural effusion is noted. Mild bilateral  posterior basilar subsegmental atelectasis is noted. Upper Abdomen: Hepatic steatosis. Musculoskeletal: No chest wall abnormality. No acute or significant osseous findings. Review of the MIP images confirms the above findings. IMPRESSION: No definite evidence of pulmonary embolus. Mild bilateral posterior basilar subsegmental atelectasis. Hepatic steatosis. Electronically Signed   By: Lupita Raider M.D.   On: 05/30/2023 08:25   DG Chest Port 1 View  Result Date: 05/28/2023 CLINICAL DATA:  Leukocytosis EXAM: PORTABLE CHEST 1 VIEW COMPARISON:  12/24/2013 FINDINGS: The heart size and mediastinal contours are within normal limits. Slightly low lung volumes. No focal airspace consolidation, pleural effusion, or pneumothorax. The visualized skeletal structures are unremarkable. IMPRESSION: No active disease. Electronically Signed   By: Duanne Guess D.O.   On: 05/28/2023 13:07   CT  HEAD WO CONTRAST ( )  Result Date: 05/27/2023 CLINICAL DATA:  Unresponsive EXAM: CT HEAD WITHOUT CONTRAST TECHNIQUE: Contiguous axial images were obtained from the base of the skull through the vertex without intravenous contrast. RADIATION DOSE REDUCTION: This exam was performed according to the departmental dose-optimization program which includes automated exposure control, adjustment of the mA and/or kV according to patient size and/or use of iterative reconstruction technique. COMPARISON:  None Available. FINDINGS: Brain: No acute territorial infarction, hemorrhage or intracranial mass. The ventricles are nonenlarged. Vascular: No hyperdense vessel or unexpected calcification. Skull: Normal. Negative for fracture or focal lesion. Sinuses/Orbits: No acute finding. Mucosal thickening and postsurgical changes at the maxillary sinuses. Other: None IMPRESSION: Negative.  No CT evidence for acute intracranial abnormality Electronically Signed   By: Jasmine Pang M.D.   On: 05/27/2023 21:53    Microbiology: Recent Results (from  the past 240 hour(s))  Aerobic/Anaerobic Culture w Gram Stain (surgical/deep wound)     Status: None   Collection Time: 06/16/23  3:15 PM   Specimen: Abscess  Result Value Ref Range Status   Specimen Description ABSCESS  Final   Special Requests Normal  Final   Gram Stain   Final    ABUNDANT WBC PRESENT, PREDOMINANTLY PMN RARE BUDDING YEAST SEEN RARE GRAM NEGATIVE RODS FEW GRAM POSITIVE COCCI IN CHAINS ABUNDANT GRAM POSITIVE RODS Performed at [facility], [address] [place], [place] [address]    Culture   Final    ABUNDANT KLEBSIELLA PNEUMONIAE ABUNDANT LACTOBACILLUS SPECIES Standardized susceptibility testing for this organism is not available. MIXED ANAEROBIC FLORA PRESENT.  CALL LAB IF FURTHER IID REQUIRED.    Report Status 06/19/2023 FINAL  Final   Organism ID, Bacteria KLEBSIELLA PNEUMONIAE  Final      Susceptibility   Klebsiella pneumoniae - MIC*    AMPICILLIN RESISTANT Resistant     CEFEPIME <=0.12 SENSITIVE Sensitive     CEFTAZIDIME <=1 SENSITIVE Sensitive     CEFTRIAXONE <=0.25 SENSITIVE Sensitive     CIPROFLOXACIN <=0.25 SENSITIVE Sensitive     GENTAMICIN <=1 SENSITIVE Sensitive     IMIPENEM <=0.25 SENSITIVE Sensitive     TRIMETH/SULFA <=20 SENSITIVE Sensitive     AMPICILLIN/SULBACTAM <=2 SENSITIVE Sensitive     PIP/TAZO <=4 SENSITIVE Sensitive     * ABUNDANT KLEBSIELLA PNEUMONIAE     Labs: Basic Metabolic Panel: Recent Labs  Lab 06/22/23 0451 06/23/23 0328 06/24/23 0428 06/25/23 0223 06/26/23 0346  NA 136 132* 132* 132* 132*  K 4.1 3.9 4.0 4.1 3.7  CL 106 100 101 97* 98  CO2 25 23 24 25 23   GLUCOSE 160* 149* 141* 147* 119*  BUN 12 13 11 13 10   CREATININE 0.65 0.56 0.57 0.68 0.66  CALCIUM 8.5* 8.6* 8.5* 9.0 8.8*  MG 1.8 1.7 1.8 1.6* 1.7  PHOS 2.9 3.0 2.9 3.5 2.8   Liver Function Tests: Recent Labs  Lab 06/22/23 0451 06/25/23 0223 06/26/23 0346  AST 26 23 23   ALT 40 26 26  ALKPHOS 107 95 90  BILITOT 1.2 1.6* 1.4*  PROT 6.0* 6.5  6.6  ALBUMIN 1.9* 2.8* 2.7*   No results for input(s): "LIPASE", "AMYLASE" in the last 168 hours. No results for input(s): "AMMONIA" in the last 168 hours. CBC: Recent Labs  Lab 06/22/23 0451 06/25/23 0223 06/26/23 0346  WBC 6.2 5.1 5.3  NEUTROABS 4.6 3.7 3.3  HGB 8.4* 8.0* 8.0*  HCT 28.8* 27.7* 27.8*  MCV 87.5 88.2 88.5  PLT 144* 132* 156  Cardiac Enzymes: No results for input(s): "CKTOTAL", "CKMB", "CKMBINDEX", "TROPONINI" in the last 168 hours. BNP: BNP (last 3 results) Recent Labs    06/07/23 2152 06/21/23 2006  BNP 126.0* 120.0*    ProBNP (last 3 results) No results for input(s): "PROBNP" in the last 8760 hours.  CBG: Recent Labs  Lab 06/25/23 1202 06/25/23 1740 06/25/23 2153 06/26/23 0725 06/26/23 1118  GLUCAP 142* 180* 179* 117* 175*       Signed:  Lacretia Nicks MD.  [facility] 06/26/2023, 1:13 PM

## 2023-06-26 NOTE — Procedures (Signed)
Pts JP bulb was broken. Changed out for new bulb and charged to negative suction.

## 2023-06-26 NOTE — TOC Transition Note (Signed)
Transition of Care [facility]) - CM/SW Discharge Note   Patient Details  Name: Frances Rodgers MRN: [id number] Date of Birth: 11-28-1970  Transition of Care [facility]) CM/SW Contact:  Tom-Johnson, Hershal Coria, RN Phone Number: 06/26/2023, 2:51 PM   Clinical Narrative:     Patient is scheduled for discharge today with JP drain in place. RN did drain care education with teach back.  CM asked patient if she understood drain care and she voiced understanding.  Readmission Risk Assessment done. Outpatient referral, hospital f/u and discharge instructions on AVS. Prescriptions sent to [facility] pharmacy and meds will be delivered to patient at bedside prior discharge. No TOC needs or recommendations noted. Sister, Toniann Fail to transport at discharge.  No further TOC needs noted.       Final next level of care: Home/Self Care Barriers to Discharge: Barriers Resolved   Patient Goals and CMS Choice CMS Medicare.gov Compare Post Acute Care list provided to:: Patient Choice offered to / list presented to : NA  Discharge Placement                  Patient to be transferred to facility by: Sister Name of family member notified: Toniann Fail    Discharge Plan and Services Additional resources added to the After Visit Summary for                  DME Arranged: N/A DME Agency: NA       HH Arranged: NA HH Agency: NA        Social Determinants of Health (SDOH) Interventions SDOH Screenings   Food Insecurity: No Food Insecurity (06/16/2023)  Housing: Low Risk  (06/16/2023)  Transportation Needs: No Transportation Needs (06/16/2023)  Utilities: Not At Risk (06/16/2023)  Alcohol Screen: Low Risk  (05/03/2018)  Depression (PHQ2-9): Low Risk  (04/12/2023)  Tobacco Use: Low Risk  (06/21/2023)     Readmission Risk Interventions    06/26/2023    2:49 PM  Readmission Risk Prevention Plan  Transportation Screening Complete  PCP or Specialist Appt within 3-5 Days Complete  HRI or Home Care Consult  Complete  Social Work Consult for Recovery Care Planning/Counseling Complete  Palliative Care Screening Not Applicable  Medication Review Oceanographer) Referral to Pharmacy

## 2023-06-26 NOTE — Progress Notes (Addendum)
[facility] Progress Note  5 Days Post-Op  Subjective: Feels well today.  Does note epigastric and left upper quadrant pain at times. This seems to resolve with po pain medications. Pain is not brought on or worsened by PO intake. Tolerating diet without n/v. BM yesterday.  JP continues to be purulent.   Objective: Vital signs in last 24 hours: Temp:  [97.5 F (36.4 C)-98.4 F (36.9 C)] 98.3 F (36.8 C) (08/12 0900) Pulse Rate:  [91-103] 103 (08/12 0900) Resp:  [16-18] 16 (08/12 0900) BP: (106-135)/(59-79) 106/59 (08/12 0900) SpO2:  [97 %-100 %] 100 % (08/12 0900) Weight:  [101.8 kg] 101.8 kg (08/12 0702) Last BM Date : 06/25/23  Intake/Output from previous day: 08/11 0701 - 08/12 0700 In: 910 [P.O.:360; I.V.:240; IV Piggyback:305] Out: 45 [Drains:45] Intake/Output this shift: Total I/O In: 300 [P.O.:300] Out: -   PE: Gen:  Alert Abd: Soft, tenderness on mostly left side of the abdomen around her drain site.  JP w/ tan purulent output ~stable output at 45cc/24 hours.  Psych: A&Ox4   Lab Results:  Recent Labs    06/25/23 0223 06/26/23 0346  WBC 5.1 5.3  HGB 8.0* 8.0*  HCT 27.7* 27.8*  PLT 132* 156   BMET Recent Labs    06/25/23 0223 06/26/23 0346  NA 132* 132*  K 4.1 3.7  CL 97* 98  CO2 25 23  GLUCOSE 147* 119*  BUN 13 10  CREATININE 0.68 0.66  CALCIUM 9.0 8.8*   PT/INR No results for input(s): "LABPROT", "INR" in the last 72 hours.  CMP     Component Value Date/Time   NA 132 (L) 06/26/2023 0346   NA 136 09/30/2022 1457   NA 137 12/24/2013 1044   K 3.7 06/26/2023 0346   K 3.8 12/24/2013 1044   CL 98 06/26/2023 0346   CL 103 12/24/2013 1044   CO2 23 06/26/2023 0346   CO2 26 12/24/2013 1044   GLUCOSE 119 (H) 06/26/2023 0346   GLUCOSE 116 (H) 12/24/2013 1044   BUN 10 06/26/2023 0346   BUN 12 09/30/2022 1457   BUN 8 12/24/2013 1044   CREATININE 0.66 06/26/2023 0346   CREATININE 1.06 12/24/2013 1044   CALCIUM 8.8 (L) 06/26/2023  0346   CALCIUM 9.0 12/24/2013 1044   PROT 6.6 06/26/2023 0346   PROT 7.0 09/30/2022 1457   PROT 7.4 12/24/2013 1044   ALBUMIN 2.7 (L) 06/26/2023 0346   ALBUMIN 3.9 09/30/2022 1457   ALBUMIN 3.4 12/24/2013 1044   AST 23 06/26/2023 0346   AST 17 12/24/2013 1044   ALT 26 06/26/2023 0346   ALT 25 12/24/2013 1044   ALKPHOS 90 06/26/2023 0346   ALKPHOS 76 12/24/2013 1044   BILITOT 1.4 (H) 06/26/2023 0346   BILITOT 0.3 09/30/2022 1457   BILITOT 0.5 12/24/2013 1044   GFRNONAA >60 06/26/2023 0346   GFRNONAA >60 12/24/2013 1044   GFRAA 92 07/08/2020 1614   GFRAA >60 12/24/2013 1044   Lipase     Component Value Date/Time   LIPASE 64 (H) 06/11/2023 0036   LIPASE 92 12/24/2013 1044       Studies/Results: IR PATIENT EVAL TECH 0-60 MINS  Result Date: 06/26/2023 Paulino Door, RT     06/26/2023  8:50 AM Pts JP bulb was broken. Changed out for new bulb and charged to negative suction.    Anti-infectives: Anti-infectives (From admission, onward)    Start     Dose/Rate Route Frequency Ordered Stop   06/26/23 1215  fluconazole (DIFLUCAN) tablet 200 mg        200 mg Oral Daily 06/26/23 1126     06/26/23 0000  amoxicillin-clavulanate (AUGMENTIN) 875-125 MG per tablet 1 tablet        1 tablet Oral Every 12 hours 06/25/23 2304     06/21/23 1200  Ampicillin-Sulbactam (UNASYN) 3 g in sodium chloride 0.9 % 100 mL IVPB  Status:  Discontinued        3 g 200 mL/hr over 30 Minutes Intravenous Every 6 hours 06/21/23 0807 06/25/23 2304   06/20/23 1045  fluconazole (DIFLUCAN) IVPB 200 mg  Status:  Discontinued        200 mg 100 mL/hr over 60 Minutes Intravenous Every 24 hours 06/20/23 0955 06/26/23 1125   06/16/23 1730  piperacillin-tazobactam (ZOSYN) IVPB 3.375 g  Status:  Discontinued        3.375 g 12.5 mL/hr over 240 Minutes Intravenous Every 8 hours 06/16/23 1634 06/21/23 1610        Assessment/Plan 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 Drain flushes per IR - Tolerating regular diet today (was tolerating this prior to admission) - Off TPN - Abx per ID.  - Okay to discharge today from our standpoint. Will arrange follow up with Dr. Dossie Der. She will also need f/u with interventional radiology.  May also need additional endoscopic evaluations or treatments with Dr. Meridee Score but we can help coordinate this as an outpatient. - Hopefully with drainage and time the sleeve will heal - We discuss strict callback/return precautions.   FEN: Reg. Cont PPI VTE: LMWH ID: Augmentin/diflucan. Afebrile. WBC wnl    - per TRH -  Asthma Recent dx of IDDM, DKA, resolved AKI, resolved Transaminitis, improving  I reviewed hospitalist notes, last 24 h vitals and pain scores, last 48 h intake and output, and last 24 h labs and trends.    LOS: 18 days   Jacinto Halim, [facility] 06/26/2023, 11:57 AM Please see Amion for pager number during day hours 7:00am-4:30pm

## 2023-06-26 NOTE — Progress Notes (Signed)
Went to patient's room to flush JP drain and found tubing/bulb disconnected from the abdomen. Patient doesn't know how it happened. Attempted to reconnect it but it won't stay connected.Went to OR to see if they have the tubing but they only have the bulb.Taped the tubing but it doesn't stay charged. 0740 Called IR and spoke with staff,described the situation, was advised to place an IR consult. Dr. Lowell Guitar made aware,order received for IR consult. Incoming RN made aware too.

## 2023-06-26 NOTE — Discharge Instructions (Signed)
Interventional Radiology Percutaneous Abscess Drain Placement After Care   This sheet gives you information about how to care for yourself after your procedure. Your health care provider may also give you more specific instructions. Your drain was placed by an interventional radiologist with [facility]. If you have questions or concerns, contact [facility] at [phone].   What is a percutaneous drain?   A drain is a small plastic tube (catheter) that goes into the fluid collection in your body through your skin.   How long will I need the drain?   How long the drain needs to stay in is determined by where the drain is, how much comes out of the drain each day and if you are having any other surgical procedures.   Interventional radiology will determine when it is time to remove the drain. It is important to follow up as directed so that the drain can be removed as soon as it is safe to do so.   What can I expect after the procedure?   After the procedure, it is common to have:   A small amount of bruising and discomfort in the area where the drainage tube (catheter) was placed.   Sleepiness and fatigue. This should go away after the medicines you were given have worn off.   Follow these instructions at home:   Insertion site care   Check your insertion site when you change the bandage. Check for:   More redness, swelling, or pain.   More fluid or blood.   Warmth.   Pus or a bad smell.   When caring for your insertion site:   Wash your hands with soap and water for at least 20 seconds before and after you change your bandage (dressing). If soap and water are not available, use hand sanitizer.   You do not need to change your dressing everyday if it is clean and dry. Change your dressing every 3 days or as needed when it is soiled, wet or becoming dislodged. You will need to change your dressing each time you shower.   Leave stitches (sutures), skin  glue, or adhesive strips in place. These skin closures may need to stay in place for 2 weeks or longer. If adhesive strip edges start to loosen and curl up, you may trim the loose edges. Do not remove adhesive strips completely unless your health care provider tells you to do so.    Catheter care   Flush the catheter once per day with 5 mL of 0.9% normal saline unless you are told otherwise by your healthcare provider. This helps to prevent clogs in the catheter.   To disconnect the drain, turn the clear plastic tube to the left. Attach the saline syringe by placing it on the white end of the drain and turning gently to the right. Once attached gently push the plunger to the 5 mL mark. After you are done flushing, disconnect the syringe by turning to the left and reattach your drainage container    If you have a bulb please be sure the bulb is charged after reconnecting it - to do this pinch the bulb between your thumb and first finger and close the stopper located on the top of the bulb.     Check for fluid leaking from around your catheter (instead of fluid draining through your catheter). This may be a sign that the drain is no longer working correctly.   Write down the following information every time  you empty your bag:   The date and time.   The amount of drainage.   Activity   Rest at home for 1-2 days after your procedure.   For the first 48 hours do not lift anything more than 10 lbs (about a gallon of milk). You may perform moderate activities/exercise. Please avoid strenuous activities during this time.   Avoid any activities which may pull on your drain as this can cause your drain to become dislodged.   If you were given a sedative during the procedure, it can affect you for several hours. Do not drive or operate machinery until your health care provider says that it is safe.   General instructions   For mild pain take over-the-counter medications as needed for pain such  as Tylenol or Advil. If you are experiencing severe pain please call our office as this may indicate an issue with your drain.    If you were prescribed an antibiotic medicine, take it as told by your health care provider. Do not stop using the antibiotic even if you start to feel better.   You may shower 24 hours after the drain is placed. To do this cover the insertion site with a water tight material such as saran wrap and seal the edges with tape, you may also purchase waterproof dressings at your local drug store. Shower as usual and then remove the water tight dressing and any gauze/tape underneath it once you have exited the shower and dried off. Allow the area to air dry or pat dry with a clean towel. Once the skin is completely dry place a new gauze dressing. It is important to keep the site dry at all times to prevent infection.   Do not submerge the drain - this means you cannot take baths, swim, use a hot tub, etc. until the drain is removed.    Do not use any products that contain nicotine or tobacco, such as cigarettes, e-cigarettes, and chewing tobacco. If you need help quitting, ask your health care provider.   Keep all follow-up visits as told by your health care provider. This is important.   Contact a health care provider if:   You have less than 10 mL of drainage a day for 2-3 days in a row, or as directed by your health care provider.   You have any of these signs of infection:   More redness, swelling, or pain around your incision area.   More fluid or blood coming from your incision area.   Warmth coming from your incision area.   Pus or a bad smell coming from your incision area.   You have fluid leaking from around your catheter (instead of through your catheter).   You are unable to flush the drain.   You have a fever or chills.   You have pain that does not get better with medicine.   You have not been contacted to schedule a drain follow up appointment  within 10 days of discharge from the hospital.   Please call [facility] at [phone] with any questions or concerns.   Get help right away if:   Your catheter comes out.   You suddenly stop having drainage from your catheter.   You suddenly have blood in the fluid that is draining from your catheter.   You become dizzy or you faint.   You develop a rash.   You have nausea or vomiting.   You have difficulty breathing or  you feel short of breath.   You develop chest pain.   You have problems with your speech or vision.   You have trouble balancing or moving your arms or legs.   Summary   It is common to have a small amount of bruising and discomfort in the area where the drainage tube (catheter) was placed. You may also have minor discomfort with movement while the drain is in place.   Flush the drain once per day with 5 mL of 0.9% normal saline (unless you were told otherwise by your healthcare provider).    Record the amount of drainage from the bag every time you empty it.   Change the dressing every 3 days or earlier if soiled/wet. Keep the skin dry under the dressing.   You may shower with the drain in place. Do not submerge the drain (no baths, swimming, hot tubs, etc.).   Contact [facility] at [phone] if you have more redness, swelling, or pain around your incision area or if you have pain that does not get better with medicine.   This information is not intended to replace advice given to you by your health care provider. Make sure you discuss any questions you have with your health care provider.   Document Revised: 02/03/2022 Document Reviewed: 10/26/2019   Elsevier Patient Education  2023 [employer]     Interventional Radiology Drain Record   Empty your drain at least once per day. You may empty it as often as needed. Use this form to write down the amount of fluid that has collected in the drainage container. Bring this  form with you to your follow-up visits. Please call [facility] at [phone] with any questions or concerns prior to your appointment.   Drain #1 location: ___________________   Date __________ Time __________ Amount __________   Date __________ Time __________ Amount __________   Date __________ Time __________ Amount __________   Date __________ Time __________ Amount __________   Date __________ Time __________ Amount __________   Date __________ Time __________ Amount __________   Date __________ Time __________ Amount __________   Date __________ Time __________ Amount __________   Date __________ Time __________ Amount __________   Date __________ Time __________ Amount __________   Date __________ Time __________ Amount __________   Date __________ Time __________ Amount __________   Date __________ Time __________ Amount __________   Date __________ Time __________ Amount __________

## 2023-06-26 NOTE — Progress Notes (Signed)
[name] to be discharge home per MD order. Discussed prescriptions and follow up appointments with the patient. Prescriptions given to patient; medication list explained in detail. Patient verbalized understanding.  Skin clean, dry and intact with out evidence of skin break down, no evidence of skin tears noted. IV catheter discontinued intact. Site without signs and symptoms of complications. Dressings and pressure applied. Patient denies pain at the site currently. No complaints noted.  Patient free of lines, drains and wounds other than JP on right side. Patient verbalized understanding on how to drain and keep clean.   An after Visit Summary (AVS) was printed and given to the patient.   Patient escorted via wheelchair and discharged home via private auto.  Pearson Grippe, RN

## 2023-06-27 ENCOUNTER — Other Ambulatory Visit: Payer: Self-pay | Admitting: Surgery

## 2023-06-27 ENCOUNTER — Telehealth: Payer: Self-pay

## 2023-06-27 DIAGNOSIS — K255 Chronic or unspecified gastric ulcer with perforation: Secondary | ICD-10-CM

## 2023-06-27 NOTE — Transitions of Care (Post Inpatient/ED Visit) (Signed)
06/27/2023  Name: Frances Rodgers MRN: [id number] DOB: 1971/05/29  Today's TOC FU Call Status: Today's TOC FU Call Status:: Successful TOC FU Call Completed TOC FU Call Complete Date: 06/27/23  Transition Care Management Follow-up Telephone Call Date of Discharge: 06/26/23 Discharge Facility: [facility]) Type of Discharge: Inpatient Admission Primary Inpatient Discharge Diagnosis:: AMS (altered mental status) How have you been since you were released from the hospital?: Better Any questions or concerns?: No  Items Reviewed: Did you receive and understand the discharge instructions provided?: Yes Medications obtained,verified, and reconciled?: Yes (Medications Reviewed) Any new allergies since your discharge?: No Dietary orders reviewed?: Yes Do you have support at home?: Yes  Medications Reviewed Today: Medications Reviewed Today     Reviewed by Merleen Nicely, LPN (Licensed Practical Nurse) on 06/27/23 at 1110  Med List Status: <None>   Medication Order Taking? Sig Documenting Provider Last Dose Status Informant  albuterol (VENTOLIN HFA) 108 (90 Base) MCG/ACT inhaler [id number] Yes Inhale 2 puffs into the lungs every 6 (six) hours as needed for wheezing or shortness of breath. [provider] Taking Active Child  amoxicillin-clavulanate (AUGMENTIN) 875-125 MG tablet [id number] Yes Take 1 tablet by mouth every 12 (twelve) hours for 25 days. Follow up with infectious disease on 9/5 for additional instructions Zigmund Daniel., MD Taking Active   Blood Glucose Monitoring Suppl DEVI [id number] Yes 1 each by Does not apply route 3 (three) times daily. May dispense any manufacturer covered by patient's insurance. Darlin Priestly, MD Taking Active Child  Cetirizine HCl 10 MG CAPS [id number] Yes Take 10 mg by mouth daily as needed (allergy). [provider] Taking Active Child  FLOVENT DISKUS 50 MCG/ACT AEPB [id number] Yes Take 1 Inhalation by mouth every 12 (twelve)  hours as needed (SOB). [provider] Taking Active Child  fluconazole (DIFLUCAN) 100 MG tablet [id number] Yes Take 2 tablets (200 mg total) by mouth daily for 25 days. Follow up with infectious disease on 9/5 for additional recommendations Zigmund Daniel., MD Taking Active   furosemide (LASIX) 40 MG tablet [id number] Yes Take 1 tablet (40 mg total) by mouth daily. For your leg swelling.  Follow up with your PCP outpatient for repeat labs and to follow your swelling to determine how you'll need to take this going forward. Zigmund Daniel., MD Taking Active   gabapentin (NEURONTIN) 300 MG capsule [id number] Yes Take 1 capsule (300 mg total) by mouth 3 (three) times daily. For pain control, this may make you sleeping or foggy, hold and discuss gradual uptitration with your PCP if this is an issue. Zigmund Daniel., MD Taking Active   Glucose Blood (BLOOD GLUCOSE TEST STRIPS) STRP [id number] Yes 1 each by Does not apply route 3 (three) times daily. Use as directed to check blood sugar. May dispense any manufacturer covered by patient's insurance and fits patient's device. Darlin Priestly, MD Taking Active Child  insulin glargine (LANTUS) 100 UNIT/ML Solostar Pen [id number] Yes Inject 11 Units into the skin at bedtime. May substitute as needed per insurance. Zigmund Daniel., MD Taking Active   insulin lispro (HUMALOG) 100 UNIT/ML KwikPen [id number] Yes Inject 3 Units into the skin 3 (three) times daily with meals. Only take if eating a meal AND Blood Glucose (BG) is 80 or higher. Zigmund Daniel., MD Taking Active   Insulin Pen Needle (PEN NEEDLES) 31G X 5 MM MISC [id number] Yes 1 each by Does not apply route 3 (three)  times daily. May dispense any manufacturer covered by patient's insurance. Darlin Priestly, MD Taking Active Child  JUNEL FE 24 1-20 MG-MCG(24) tablet [id number] Yes TAKE 1 TABLET BY MOUTH DAILY  Patient taking differently: Take 1 tablet by mouth at bedtime.   Hildred Laser, MD Taking Active Child  Lancet Device MISC [id number] Yes 1 each by Does not apply route 3 (three) times daily. May dispense any manufacturer covered by patient's insurance. Darlin Priestly, MD Taking Active Child  Lancets MISC [id number] Yes 1 each by Does not apply route 3 (three) times daily. Use as directed to check blood sugar. May dispense any manufacturer covered by patient's insurance and fits patient's device. Darlin Priestly, MD Taking Active Child  montelukast (SINGULAIR) 10 MG tablet [id number] Yes Take 10 mg by mouth at bedtime. [provider] Taking Active Child  Multiple Vitamin (MULTI VITAMIN DAILY PO) [id number] Yes Take 1 tablet by mouth daily. [provider] Taking Active Child  oxyCODONE (OXY IR/ROXICODONE) 5 MG immediate release tablet [id number] Yes Take 1-2 tablets (5-10 mg total) by mouth every 6 (six) hours as needed for up to 5 days (for pain not relieved by tylenol). Zigmund Daniel., MD Taking Active   pantoprazole (PROTONIX) 40 MG tablet [id number] Yes Take 1 tablet (40 mg total) by mouth 2 (two) times daily. Follow up with GI and/or surgery for additional instructions Zigmund Daniel., MD Taking Active   potassium chloride (KLOR-CON M) 10 MEQ tablet [id number] Yes Take 2 tablets (20 mEq total) by mouth daily. Zigmund Daniel., MD Taking Active   QVAR REDIHALER 40 MCG/ACT inhaler [id number] Yes Inhale 1 puff into the lungs 2 (two) times daily. [provider] Taking Active Child  Sodium Chloride Flush (SALINE FLUSH) 0.9 % SOLN [id number] No Flush drain daily with 5 ml saline  Patient not taking: Reported on 06/27/2023   Zigmund Daniel., MD Not Taking Active             Home Care and Equipment/Supplies: Were Home Health Services Ordered?: NA Any new equipment or medical supplies ordered?: NA  Functional Questionnaire: Do you need assistance with bathing/showering or dressing?: No Do you need assistance with meal preparation?:  No Do you need assistance with eating?: No Do you have difficulty maintaining continence: No Do you need assistance with getting out of bed/getting out of a chair/moving?: No Do you have difficulty managing or taking your medications?: No  Follow up appointments reviewed: PCP Follow-up appointment confirmed?: No (pt needs appt by 07-10-23  needs mon or fri due to ride - no available appts- will ask front desk to call pt and make hosp fu appt) MD Provider Line Number:[phone] Given: Yes Follow-up Provider: Enid Skeens NP Specialist Hospital Follow-up appointment confirmed?: Yes Date of Specialist follow-up appointment?: 07/20/03 Follow-Up Specialty Provider:: Dr Thedore Mins Do you need transportation to your follow-up appointment?: No Do you understand care options if your condition(s) worsen?: Yes-patient verbalized understanding    SIGNATURE  Woodfin Ganja LPN [facility] Nurse Health Advisor Direct Dial [phone]

## 2023-07-01 ENCOUNTER — Telehealth (HOSPITAL_COMMUNITY): Payer: Self-pay | Admitting: Student

## 2023-07-01 NOTE — Telephone Encounter (Signed)
Patient with intra-abdominal abscess drain placed 06/16/23. She called IR today with concerns about the drain not functioning properly. She stated there is some fluid leaking around the skin insertion site. She describes it as being " a lot" but she only has to change the dressing once daily. She said there is still fluid draining into the bulb every day. She otherwise has no complaints - she is afebrile and without abdominal pain, nausea, diarrhea, etc. She endorses minor discomfort around the drain insertion site.   She is scheduled to follow up with IR 07/06/23 for a drain evaluation with CT scan and possible drain injection. I reassured the patient that some drainage around the skin insertion site is not abnormal and that that biggest indicator that things are ok is that she feels well. I confirmed her appointment on the 22nd but encouraged her to call us back on Monday if things have worsened. She was in agreement with this plan.   Alwyn Ren, [place] [phone] 07/01/2023, 9:40 AM

## 2023-07-05 ENCOUNTER — Ambulatory Visit (INDEPENDENT_AMBULATORY_CARE_PROVIDER_SITE_OTHER): Payer: 59 | Admitting: Nurse Practitioner

## 2023-07-05 ENCOUNTER — Encounter: Payer: Self-pay | Admitting: Nurse Practitioner

## 2023-07-05 VITALS — BP 128/74 | HR 73 | Wt 216.0 lb

## 2023-07-05 DIAGNOSIS — R410 Disorientation, unspecified: Secondary | ICD-10-CM

## 2023-07-05 DIAGNOSIS — R7401 Elevation of levels of liver transaminase levels: Secondary | ICD-10-CM | POA: Diagnosis not present

## 2023-07-05 DIAGNOSIS — E118 Type 2 diabetes mellitus with unspecified complications: Secondary | ICD-10-CM

## 2023-07-05 DIAGNOSIS — R2689 Other abnormalities of gait and mobility: Secondary | ICD-10-CM

## 2023-07-05 DIAGNOSIS — K255 Chronic or unspecified gastric ulcer with perforation: Secondary | ICD-10-CM | POA: Diagnosis not present

## 2023-07-05 DIAGNOSIS — I5030 Unspecified diastolic (congestive) heart failure: Secondary | ICD-10-CM | POA: Insufficient documentation

## 2023-07-05 DIAGNOSIS — D500 Iron deficiency anemia secondary to blood loss (chronic): Secondary | ICD-10-CM

## 2023-07-05 DIAGNOSIS — E875 Hyperkalemia: Secondary | ICD-10-CM

## 2023-07-05 DIAGNOSIS — G9341 Metabolic encephalopathy: Secondary | ICD-10-CM

## 2023-07-05 DIAGNOSIS — R531 Weakness: Secondary | ICD-10-CM

## 2023-07-05 DIAGNOSIS — E1110 Type 2 diabetes mellitus with ketoacidosis without coma: Secondary | ICD-10-CM | POA: Diagnosis not present

## 2023-07-05 DIAGNOSIS — N179 Acute kidney failure, unspecified: Secondary | ICD-10-CM

## 2023-07-05 DIAGNOSIS — A419 Sepsis, unspecified organism: Secondary | ICD-10-CM

## 2023-07-05 MED ORDER — HYDROCODONE-ACETAMINOPHEN 5-325 MG PO TABS
1.0000 | ORAL_TABLET | Freq: Four times a day (QID) | ORAL | 0 refills | Status: DC | PRN
Start: 2023-07-05 — End: 2023-07-14

## 2023-07-05 MED ORDER — FREESTYLE LIBRE 3 SENSOR MISC: 11 refills | Status: DC

## 2023-07-05 NOTE — Patient Instructions (Addendum)
I have sent the referral for Physical therapy, cardiology, and endocrinology for you today. They will call you to get scheduled.   I have ordered the freestyle libre for the continuous glucose monitoring to keep a close eye on your blood sugars.  If your blood sugars are running higher than 180 more than 2 days in a row in the morning when fasting, please let me know.

## 2023-07-05 NOTE — Progress Notes (Signed)
[name], DNP, AGNP-c [facility] [address] [place], [place] [address] Main Office [phone]  ESTABLISHED PATIENT- Hospital Follow-Up Visit  Last menstrual period 06/08/2023.  HPI  Frances Rodgers  is a 52 y.o. year old female presenting today for evaluation and management following hospitalization.   Frances Rodgers was sent to the ED after being seen in the office on 07/24 with somnolence and decreased LOC. She was thought to be in DKA. She was hospitalized on 07/13 for DKA.   Date of Hospitalization: 07/24-08/12  She was found to have significantly elevated glucose and lactic acid levels. She was also found to have transaminitis, AKI, Starvation ketoacidosis, and contained gastric perforation. US revealed gallstones, but no thickening. Her UA was negative for infection. She was diagnosed with DKA, acute metabolic encephalopathy, and gastric perforation.   Drain was placed by IR and she received antibiotic therapy.  Transaminitis improved, possible infection vs shock liver vs CBD stricture AKI resolved CBG regulated- released on reduced insulin with management by PCP PO lasix started for volume overload ECHO EF 70-75% with grade 1 diastolic dysfunction.  She will follow with GI, IR, Infectious Disease. She is in need of cardiology referral for D-HF and LE edema.  She is in need of rehab referral for prolonged hospitalization with significant illness, edema and weakness.  She is in need of endocrinology referral for diabetes.   She tells me today she "feels like crap" since coming home. She reports her legs hurt, her abdomen hurts, and she is having difficulty getting around.   Frances Rodgers tells me today: She is in need of FMLA paperwork to be completed.  She is in need of PT She was unaware of heart failure Her legs are swelling.  Her shoulder is also still hurting She was given oxy in the hospital at discharge, but she has taken all of these and they did not  help. She tells me her blood sugars have been under 200 consistently since leaving the hospital on Sunday.   She is on 3u of humalog She is on 11u of lantus When she wakes up  her blood sugars have been 156, 121, 146, once it was 181.   ROS All ROS negative with exception of what is listed in HPI  PHYSICAL EXAM Physical Exam Vitals and nursing note reviewed.  Constitutional:      General: She is not in acute distress.    Appearance: She is obese. She is ill-appearing.  Eyes:     Conjunctiva/sclera: Conjunctivae normal.  Neck:     Vascular: No carotid bruit.  Cardiovascular:     Rate and Rhythm: Normal rate and regular rhythm.     Pulses: Normal pulses.     Heart sounds: Normal heart sounds.  Pulmonary:     Effort: Pulmonary effort is normal.     Breath sounds: Normal breath sounds.  Abdominal:     General: Bowel sounds are normal.     Tenderness: There is abdominal tenderness. There is guarding.  Musculoskeletal:     Right lower leg: Edema present.     Left lower leg: Edema present.  Skin:    General: Skin is warm and dry.     Capillary Refill: Capillary refill takes less than 2 seconds.     Findings: Bruising present.  Neurological:     Mental Status: She is alert and oriented to person, place, and time.     Motor: Weakness present.     ASSESSMENT & PLAN Problem List  Items Addressed This Visit   None  Frances Rodgers has experienced significant changes in her health over the last few months with two hospitalizations, surgery, new diabetes, new decreased heart function, AKI, and sepsis. At this time she does appear to be doing better, but she is in need of extensive follow-up. I am concerned about her knowledge and ability to manage her diabetes. We will send a referral to endocrinology today for further recommendations, education, and management. She is in need of rehab to help gain strength back given the prolonged hospitalization. She is also in need of cardiology referral.  There are many concerns present at this time and there will need to be extensive education and close follow-up to ensure that she is able to recover as she needs to and prevent future events. Referrals placed today. Medication reviewed. Will complete FMLA paperwork for patient once this is received.   FOLLOW-UP   Time: 70 minutes, >50% spent counseling, care coordination, chart review, and documentation.   [name], DNP, AGNP-c   History, Medications, Surgery, SDOH, and Family History reviewed and updated as appropriate.  Health Maintenance Schedule Health Maintenance reviewed .

## 2023-07-06 ENCOUNTER — Ambulatory Visit
Admission: RE | Admit: 2023-07-06 | Discharge: 2023-07-06 | Disposition: A | Discharge status: Home / Self Care | Payer: 59 | Source: Ambulatory Visit | Attending: Surgery | Admitting: Surgery

## 2023-07-06 ENCOUNTER — Other Ambulatory Visit: Payer: Self-pay | Admitting: Surgery

## 2023-07-06 ENCOUNTER — Ambulatory Visit
Admission: RE | Admit: 2023-07-06 | Discharge: 2023-07-06 | Disposition: A | Discharge status: Home / Self Care | Payer: 59 | Source: Ambulatory Visit | Attending: Radiology | Admitting: Radiology

## 2023-07-06 DIAGNOSIS — K255 Chronic or unspecified gastric ulcer with perforation: Secondary | ICD-10-CM

## 2023-07-06 HISTORY — PX: IR RADIOLOGIST EVAL & MGMT: IMG5224

## 2023-07-06 LAB — CBC WITH DIFFERENTIAL/PLATELET
Basophils Absolute: 0 10*3/uL (ref 0.0–0.2)
Basos: 0 %
EOS (ABSOLUTE): 0 10*3/uL (ref 0.0–0.4)
Eos: 0 %
Hematocrit: 29.3 % — ABNORMAL LOW (ref 34.0–46.6)
Hemoglobin: 9.2 g/dL — ABNORMAL LOW (ref 11.1–15.9)
Immature Grans (Abs): 0.1 10*3/uL (ref 0.0–0.1)
Immature Granulocytes: 2 %
Lymphocytes Absolute: 1.1 10*3/uL (ref 0.7–3.1)
Lymphs: 25 %
MCH: 26.1 pg — ABNORMAL LOW (ref 26.6–33.0)
MCHC: 31.4 g/dL — ABNORMAL LOW (ref 31.5–35.7)
MCV: 83 fL (ref 79–97)
Monocytes Absolute: 0.2 10*3/uL (ref 0.1–0.9)
Monocytes: 5 %
NRBC: 1 % — ABNORMAL HIGH (ref 0–0)
Neutrophils Absolute: 3.1 10*3/uL (ref 1.4–7.0)
Neutrophils: 68 %
Platelets: 432 10*3/uL (ref 150–450)
RBC: 3.52 x10E6/uL — ABNORMAL LOW (ref 3.77–5.28)
WBC: 4.5 10*3/uL (ref 3.4–10.8)

## 2023-07-06 LAB — COMPREHENSIVE METABOLIC PANEL
ALT: 54 IU/L — ABNORMAL HIGH (ref 0–32)
AST: 51 IU/L — ABNORMAL HIGH (ref 0–40)
Albumin: 3 g/dL — ABNORMAL LOW (ref 3.8–4.9)
Alkaline Phosphatase: 296 IU/L — ABNORMAL HIGH (ref 44–121)
BUN/Creatinine Ratio: 25 — ABNORMAL HIGH (ref 9–23)
BUN: 13 mg/dL (ref 6–24)
Bilirubin Total: 2.4 mg/dL — ABNORMAL HIGH (ref 0.0–1.2)
CO2: 20 mmol/L (ref 20–29)
Calcium: 9.5 mg/dL (ref 8.7–10.2)
Chloride: 103 mmol/L (ref 96–106)
Creatinine, Ser: 0.53 mg/dL — ABNORMAL LOW (ref 0.57–1.00)
Globulin, Total: 3.6 g/dL (ref 1.5–4.5)
Glucose: 232 mg/dL — ABNORMAL HIGH (ref 70–99)
Potassium: 4.7 mmol/L (ref 3.5–5.2)
Sodium: 135 mmol/L (ref 134–144)
Total Protein: 6.6 g/dL (ref 6.0–8.5)
eGFR: 112 mL/min/{1.73_m2} (ref >59)

## 2023-07-06 LAB — IRON,TIBC AND FERRITIN PANEL
Ferritin: 299 ng/mL — ABNORMAL HIGH (ref 15–150)
Iron Saturation: 12 % — ABNORMAL LOW (ref 15–55)
Iron: 23 ug/dL — ABNORMAL LOW (ref 27–159)
Total Iron Binding Capacity: 196 ug/dL — ABNORMAL LOW (ref 250–450)
UIBC: 173 ug/dL (ref 131–425)

## 2023-07-06 LAB — BRAIN NATRIURETIC PEPTIDE: BNP: 97.4 pg/mL (ref 0.0–100.0)

## 2023-07-07 ENCOUNTER — Other Ambulatory Visit: Payer: Self-pay | Admitting: Surgery

## 2023-07-07 DIAGNOSIS — K255 Chronic or unspecified gastric ulcer with perforation: Secondary | ICD-10-CM

## 2023-07-07 DIAGNOSIS — K651 Peritoneal abscess: Secondary | ICD-10-CM

## 2023-07-11 ENCOUNTER — Telehealth (HOSPITAL_COMMUNITY): Payer: Self-pay | Admitting: Student

## 2023-07-11 ENCOUNTER — Encounter: Payer: Self-pay | Admitting: Nurse Practitioner

## 2023-07-11 NOTE — Telephone Encounter (Signed)
Patient called IR today with concerns about blood being in the drainage tubing. She noticed it yesterday but today she states it is clearing up. The output is now a light pink. She has no other complaints and states she feels fine.  I reassured her that sometimes the drain can cause irritation leading to a little bit of bleeding. I told her that if she noticed frank blood that wasn't clearing up and/or she felt any weakness, dizziness, nausea, fever, chills, abdominal pain, etc then she should call us back.   Alwyn Ren, [place] [phone] 07/11/2023, 8:12 AM

## 2023-07-12 ENCOUNTER — Other Ambulatory Visit: Payer: Self-pay

## 2023-07-12 MED ORDER — PEN NEEDLES 31G X 5 MM MISC: 1.0000 | Freq: Three times a day (TID) | 1 refills | Status: DC

## 2023-07-12 MED ORDER — INSULIN LISPRO (1 UNIT DIAL) 100 UNIT/ML (KWIKPEN): 3.0000 [IU] | PEN_INJECTOR | Freq: Three times a day (TID) | SUBCUTANEOUS | 1 refills | Status: AC

## 2023-07-12 MED ORDER — BLOOD GLUCOSE TEST VI STRP: 1.0000 | ORAL_STRIP | Freq: Three times a day (TID) | 1 refills | Status: DC

## 2023-07-12 MED ORDER — INSULIN GLARGINE 100 UNIT/ML SOLOSTAR PEN: 11.0000 [IU] | PEN_INJECTOR | Freq: Every day | SUBCUTANEOUS | 1 refills | Status: AC

## 2023-07-12 MED ORDER — LANCET DEVICE MISC: 1.0000 | Freq: Three times a day (TID) | 1 refills | Status: DC

## 2023-07-12 MED ORDER — GABAPENTIN 300 MG PO CAPS: 300.0000 mg | ORAL_CAPSULE | Freq: Three times a day (TID) | ORAL | 1 refills | Status: DC

## 2023-07-13 ENCOUNTER — Encounter: Payer: Self-pay | Admitting: Nurse Practitioner

## 2023-07-13 DIAGNOSIS — K255 Chronic or unspecified gastric ulcer with perforation: Secondary | ICD-10-CM

## 2023-07-13 DIAGNOSIS — G8928 Other chronic postprocedural pain: Secondary | ICD-10-CM

## 2023-07-14 MED ORDER — OXYCODONE HCL 5 MG PO TABS: 5.0000 mg | ORAL_TABLET | Freq: Four times a day (QID) | ORAL | 0 refills | Status: DC | PRN

## 2023-07-18 ENCOUNTER — Encounter: Payer: Self-pay | Admitting: Internal Medicine

## 2023-07-18 ENCOUNTER — Encounter: Payer: Self-pay | Admitting: Surgery

## 2023-07-18 ENCOUNTER — Ambulatory Visit
Admission: RE | Admit: 2023-07-18 | Discharge: 2023-07-18 | Disposition: A | Discharge status: Home / Self Care | Payer: 59 | Source: Ambulatory Visit | Attending: Surgery | Admitting: Surgery

## 2023-07-18 ENCOUNTER — Other Ambulatory Visit: Payer: Self-pay | Admitting: Interventional Radiology

## 2023-07-18 ENCOUNTER — Ambulatory Visit: Admission: RE | Admit: 2023-07-18 | Payer: 59 | Source: Ambulatory Visit

## 2023-07-18 DIAGNOSIS — K651 Peritoneal abscess: Secondary | ICD-10-CM

## 2023-07-18 DIAGNOSIS — K255 Chronic or unspecified gastric ulcer with perforation: Secondary | ICD-10-CM

## 2023-07-18 HISTORY — PX: IR RADIOLOGIST EVAL & MGMT: IMG5224

## 2023-07-18 NOTE — Progress Notes (Signed)
IR brief note   The patient was seen today in IR clinic for drain follow-up.  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  The patient reports continued decreased output through the percutaneous drainage catheter, now less than 5-10 mL daily.  Review of CT imaging same day demonstrated continued interval decrease of the fluid component of the abscess cavity.  However, there remains a large air component still present.  Findings were discussed over the telephone with the surgeon (Dr. Ivar Drape), who is planning to see the patient later this week.  Given the persistent presence of air in the cavity, we agreed about concern for persistent communication with the gastric lumen.  Plan for drain to remain in place, and continue current course of care.    Frances Bass, MD  Vascular and Interventional Radiology 07/18/2023 11:06 AM

## 2023-07-19 MED ORDER — HYDROCODONE-ACETAMINOPHEN 5-325 MG PO TABS
1.0000 | ORAL_TABLET | Freq: Four times a day (QID) | ORAL | 0 refills | Status: DC | PRN
Start: 2023-07-19 — End: 2023-07-21

## 2023-07-19 MED ORDER — GABAPENTIN 300 MG PO CAPS
ORAL_CAPSULE | ORAL | 1 refills | Status: AC
Start: 2023-07-19 — End: ?

## 2023-07-19 NOTE — Addendum Note (Signed)
Addended by: Kaylor Maiers, [place] Dec E on: 07/19/2023 04:15 PM   Modules accepted: Orders

## 2023-07-20 ENCOUNTER — Ambulatory Visit (INDEPENDENT_AMBULATORY_CARE_PROVIDER_SITE_OTHER): Payer: 59 | Admitting: Internal Medicine

## 2023-07-20 ENCOUNTER — Other Ambulatory Visit: Payer: Self-pay

## 2023-07-20 VITALS — Wt 220.4 lb

## 2023-07-20 DIAGNOSIS — K651 Peritoneal abscess: Secondary | ICD-10-CM | POA: Diagnosis not present

## 2023-07-20 DIAGNOSIS — L0291 Cutaneous abscess, unspecified: Secondary | ICD-10-CM | POA: Diagnosis not present

## 2023-07-20 DIAGNOSIS — K750 Abscess of liver: Secondary | ICD-10-CM

## 2023-07-20 MED ORDER — AMOXICILLIN-POT CLAVULANATE 875-125 MG PO TABS: 1.0000 | ORAL_TABLET | Freq: Two times a day (BID) | ORAL | 0 refills | Status: AC

## 2023-07-20 MED ORDER — FLUCONAZOLE 100 MG PO TABS: 200.0000 mg | ORAL_TABLET | Freq: Every day | ORAL | 0 refills | Status: AC

## 2023-07-20 NOTE — Progress Notes (Addendum)
Patient: Frances Rodgers  DOB: December 13, 1970 MRN: [id number] PCP: Tollie Eth, NP   Patient Active Problem List   Diagnosis Date Noted   DM (diabetes mellitus), type 2 with complications (HCC) 07/05/2023   Diastolic heart failure (HCC) 07/05/2023   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   Transaminitis 06/12/2023   Nausea & vomiting 06/08/2023   AMS (altered mental status) 06/07/2023   Positive D dimer 05/30/2023   Obesity (BMI 30-39.9) 05/30/2023   Sepsis (HCC) 05/29/2023   Acute urinary retention 05/28/2023   Abnormal urinalysis 05/28/2023   Elevated blood pressure reading 05/28/2023   Acute metabolic encephalopathy 05/28/2023   Hypernatremia 05/28/2023   DKA (diabetic ketoacidosis) (HCC) 05/27/2023   Hyperkalemia 05/27/2023   AKI (acute kidney injury) (HCC) 05/27/2023   GERD without esophagitis 05/27/2023   Shoulder pain, left 03/09/2023   Derangement of left shoulder joint 03/09/2023   Asthma 10/01/2021   Gastroesophageal reflux disease 10/01/2021   Iron deficiency anemia 07/09/2020   Moderate obesity 07/09/2020   Menorrhagia with regular cycle 07/09/2020     Subjective:  Frances Rodgers is a60 year old female with history as below including gastric sleevenewly diagnosed diabetes mellitus, asthma presents for hospital follow-up contained perforation of gastric fundus due to suture dehiscence from prior gastrectomy status post drain placement, [place] cultures.  Patient was initially admitted with altered mental status found to have intra-abdominal abscess on CT.  LFTs were elevated.  Initial CT showed: Lithiasis otherwise unrevealing.  MRCP showed 9 cm collection of gas and fluid lateral to the stomach in the left quadrant.  Repeat CT showed fluid collection next to the stomach.  GI and general surgery followed.  Patient underwent CT-guided drain placement cultures were polymicrobial with Klebsiella pneumonia, lactobacillus species mixed anaerobic flora.  Given that it was  intra-abdominal abscess she was started on fluconazole as well.  Gram stain showed budding yeast.  Underwent EGD on 8/7 stent was not removed was not feasible.  Discharged on Augmentin and fluconazole for at least 4 weeks from drain placement on 8/2.  Needs repeat imaging prior to stopping antibiotics Today 07/20/23: pt was seen by surgery this am and noted that plan was ot leave drain in for 20 days, F>U appt on 10/10. She has mikly discharge in drain LLQ.  Review of Systems  All other systems reviewed and are negative.   Past Medical History:  Diagnosis Date   Allergy    Anemia    Asthma    Blood transfusion without reported diagnosis 2023   GERD (gastroesophageal reflux disease)    Seasonal allergies     Outpatient Medications Prior to Visit  Medication Sig Dispense Refill   albuterol (VENTOLIN HFA) 108 (90 Base) MCG/ACT inhaler Inhale 2 puffs into the lungs every 6 (six) hours as needed for wheezing or shortness of breath.     amoxicillin-clavulanate (AUGMENTIN) 875-125 MG tablet Take 1 tablet by mouth every 12 (twelve) hours for 25 days. Follow up with infectious disease on 9/5 for additional instructions 50 tablet 0   Blood Glucose Monitoring Suppl DEVI 1 each by Does not apply route 3 (three) times daily. May dispense any manufacturer covered by patient's insurance. 1 each 0   Cetirizine HCl 10 MG CAPS Take 10 mg by mouth daily as needed (allergy).     Continuous Glucose Sensor (FREESTYLE LIBRE 3 SENSOR) MISC Place 1 sensor on the skin every 14 days. Use to check glucose continuously. 2 each 11   FLOVENT DISKUS 50 MCG/ACT  AEPB Take 1 Inhalation by mouth every 12 (twelve) hours as needed (SOB).     fluconazole (DIFLUCAN) 100 MG tablet Take 2 tablets (200 mg total) by mouth daily for 25 days. Follow up with infectious disease on 9/5 for additional recommendations 50 tablet 0   furosemide (LASIX) 40 MG tablet Take 1 tablet (40 mg total) by mouth daily. For your leg swelling.  Follow up  with your PCP outpatient for repeat labs and to follow your swelling to determine how you'll need to take this going forward. 30 tablet 0   gabapentin (NEURONTIN) 300 MG capsule Take 1-2 capsules 3 times a day as needed for pain control. May cause sleepiness. 180 capsule 1   Glucose Blood (BLOOD GLUCOSE TEST STRIPS) STRP 1 each by Does not apply route 3 (three) times daily. Use as directed to check blood sugar. May dispense any manufacturer covered by patient's insurance and fits patient's device. 100 strip 1   HYDROcodone-acetaminophen (NORCO/VICODIN) 5-325 MG tablet Take 1-2 tablets by mouth every 6 (six) hours as needed for moderate pain or severe pain. Take smallest dose possible for pain goal <5/10. 120 tablet 0   insulin glargine (LANTUS) 100 UNIT/ML Solostar Pen Inject 11 Units into the skin at bedtime. May substitute as needed per insurance. 15 mL 1   insulin lispro (HUMALOG) 100 UNIT/ML KwikPen Inject 3 Units into the skin 3 (three) times daily with meals. Only take if eating a meal AND Blood Glucose (BG) is 80 or higher. 15 mL 1   Insulin Pen Needle (PEN NEEDLES) 31G X 5 MM MISC 1 each by Does not apply route 3 (three) times daily. May dispense any manufacturer covered by patient's insurance. 100 each 1   JUNEL FE 24 1-20 MG-MCG(24) tablet TAKE 1 TABLET BY MOUTH DAILY (Patient taking differently: Take 1 tablet by mouth at bedtime.) 84 tablet 3   Lancet Device MISC 1 each by Does not apply route 3 (three) times daily. May dispense any manufacturer covered by patient's insurance. 100 each 1   Lancets MISC 1 each by Does not apply route 3 (three) times daily. Use as directed to check blood sugar. May dispense any manufacturer covered by patient's insurance and fits patient's device. 100 each 0   montelukast (SINGULAIR) 10 MG tablet Take 10 mg by mouth at bedtime.     Multiple Vitamin (MULTI VITAMIN DAILY PO) Take 1 tablet by mouth daily.     pantoprazole (PROTONIX) 40 MG tablet Take 1 tablet (40  mg total) by mouth 2 (two) times daily. Follow up with GI and/or surgery for additional instructions 120 tablet 0   potassium chloride (KLOR-CON M) 10 MEQ tablet Take 2 tablets (20 mEq total) by mouth daily. 60 tablet 0   QVAR REDIHALER 40 MCG/ACT inhaler Inhale 1 puff into the lungs 2 (two) times daily.     Sodium Chloride Flush (SALINE FLUSH) 0.9 % SOLN Flush drain daily with 5 ml saline 140 mL 0   No facility-administered medications prior to visit.     Allergies  Allergen Reactions   Aspirin Anaphylaxis and Shortness Of Breath   Ketorolac Anaphylaxis   Ketorolac Tromethamine Shortness Of Breath    Social History   Tobacco Use   Smoking status: Never   Smokeless tobacco: Never  Vaping Use   Vaping status: Never Used  Substance Use Topics   Alcohol use: Never   Drug use: Never    Family History  Problem Relation Age of Onset  Diabetes Mother    Stroke Mother    Diabetes Brother    Stroke Brother    Pancreatic cancer Maternal Grandmother     Objective:  There were no vitals filed for this visit. There is no height or weight on file to calculate BMI.  Physical Exam Constitutional:      Appearance: Normal appearance.  HENT:     Head: Normocephalic and atraumatic.     Right Ear: Tympanic membrane normal.     Left Ear: Tympanic membrane normal.     Nose: Nose normal.     Mouth/Throat:     Mouth: Mucous membranes are moist.  Eyes:     Extraocular Movements: Extraocular movements intact.     Conjunctiva/sclera: Conjunctivae normal.     Pupils: Pupils are equal, round, and reactive to light.  Cardiovascular:     Rate and Rhythm: Normal rate and regular rhythm.     Heart sounds: No murmur heard.    No friction rub. No gallop.  Pulmonary:     Effort: Pulmonary effort is normal.     Breath sounds: Normal breath sounds.  Abdominal:     General: Abdomen is flat.     Palpations: Abdomen is soft.  Musculoskeletal:        General: Normal range of motion.  Skin:     General: Skin is warm and dry.  Neurological:     General: No focal deficit present.     Mental Status: She is alert and oriented to person, place, and time.  Psychiatric:        Mood and Affect: Mood normal.     Lab Results: Lab Results  Component Value Date   WBC 4.5 07/05/2023   HGB 9.2 (L) 07/05/2023   HCT 29.3 (L) 07/05/2023   MCV 83 07/05/2023   PLT 432 07/05/2023    Lab Results  Component Value Date   CREATININE 0.53 (L) 07/05/2023   BUN 13 07/05/2023   NA 135 07/05/2023   K 4.7 07/05/2023   CL 103 07/05/2023   CO2 20 07/05/2023    Lab Results  Component Value Date   ALT 54 (H) 07/05/2023   AST 51 (H) 07/05/2023   ALKPHOS 296 (H) 07/05/2023   BILITOT 2.4 (H) 07/05/2023     Assessment & Plan:  #Intrabdominal abscess with drain in place Abdomen pelvis on 9/3 showed persistent left upper quadrant abscess cavity was stable percutaneous drainage catheter.  The cavity is overall slightly smaller in size decreased full volume.  Persistent air within cavity suspicious for communication with gastric body.  IR noted that they will follow-up in 1 week following surgical appointment. - Follow-up in 1 week, continue abx(fluconazole and augmentin) - Follow-up surgical plan. - CBC, CMP, ESR, CRP today, EKG today with qtc 418.   Danelle Earthly, MD [facility]   07/20/23  12:45 PM I have personally spent 66 minutes involved in face-to-face and non-face-to-face activities for this patient on the day of the visit. Professional time spent includes the following activities: Preparing to see the patient (review of tests), Obtaining and/or reviewing separately obtained history (admission/discharge record), Performing a medically appropriate examination and/or evaluation , Ordering medications/tests/procedures, referring and communicating with other health care professionals, Documenting clinical information in the EMR, Independently  interpreting results (not separately reported), Communicating results to the patient/family/caregiver, Counseling and educating the patient/family/caregiver and Care coordination (not separately reported).

## 2023-07-21 ENCOUNTER — Encounter: Payer: Self-pay | Admitting: Cardiology

## 2023-07-21 ENCOUNTER — Ambulatory Visit: Payer: 59 | Attending: Cardiology | Admitting: Cardiology

## 2023-07-21 VITALS — BP 132/72 | HR 83 | Ht 66.0 in | Wt 219.4 lb

## 2023-07-21 DIAGNOSIS — I5189 Other ill-defined heart diseases: Secondary | ICD-10-CM

## 2023-07-21 DIAGNOSIS — R600 Localized edema: Secondary | ICD-10-CM

## 2023-07-21 LAB — COMPLETE METABOLIC PANEL WITH GFR
AG Ratio: 0.8 (calc) — ABNORMAL LOW (ref 1.0–2.5)
ALT: 18 U/L (ref 6–29)
AST: 17 U/L (ref 10–35)
Albumin: 2.9 g/dL — ABNORMAL LOW (ref 3.6–5.1)
Alkaline phosphatase (APISO): 154 U/L — ABNORMAL HIGH (ref 37–153)
BUN/Creatinine Ratio: 16 (calc) (ref 6–22)
BUN: 7 mg/dL (ref 7–25)
CO2: 23 mmol/L (ref 20–32)
Calcium: 8.4 mg/dL — ABNORMAL LOW (ref 8.6–10.4)
Chloride: 104 mmol/L (ref 98–110)
Creat: 0.43 mg/dL — ABNORMAL LOW (ref 0.50–1.03)
Globulin: 3.7 g/dL (ref 1.9–3.7)
Glucose, Bld: 131 mg/dL — ABNORMAL HIGH (ref 65–99)
Potassium: 4 mmol/L (ref 3.5–5.3)
Sodium: 135 mmol/L (ref 135–146)
Total Bilirubin: 1.2 mg/dL (ref 0.2–1.2)
Total Protein: 6.6 g/dL (ref 6.1–8.1)
eGFR: 118 mL/min/{1.73_m2} (ref >=60)

## 2023-07-21 LAB — CBC WITH DIFFERENTIAL/PLATELET
Absolute Monocytes: 810 {cells}/uL (ref 200–950)
Basophils Absolute: 30 {cells}/uL (ref 0–200)
Basophils Relative: 0.4 %
Eosinophils Absolute: 23 {cells}/uL (ref 15–500)
Eosinophils Relative: 0.3 %
HCT: 28.3 % — ABNORMAL LOW (ref 35.0–45.0)
Hemoglobin: 8.6 g/dL — ABNORMAL LOW (ref 11.7–15.5)
Lymphs Abs: 1733 {cells}/uL (ref 850–3900)
MCH: 27.8 pg (ref 27.0–33.0)
MCHC: 30.4 g/dL — ABNORMAL LOW (ref 32.0–36.0)
MCV: 91.6 fL (ref 80.0–100.0)
MPV: 10.6 fL (ref 7.5–12.5)
Monocytes Relative: 10.8 %
Neutro Abs: 4905 {cells}/uL (ref 1500–7800)
Neutrophils Relative %: 65.4 %
Platelets: 305 10*3/uL (ref 140–400)
RBC: 3.09 10*6/uL — ABNORMAL LOW (ref 3.80–5.10)
RDW: 20.5 % — ABNORMAL HIGH (ref 11.0–15.0)
Total Lymphocyte: 23.1 %
WBC: 7.5 10*3/uL (ref 3.8–10.8)

## 2023-07-21 LAB — SEDIMENTATION RATE: Sed Rate: 116 mm/h — ABNORMAL HIGH (ref 0–30)

## 2023-07-21 LAB — C-REACTIVE PROTEIN: CRP: 93.5 mg/L — ABNORMAL HIGH (ref <8.0)

## 2023-07-21 MED ORDER — FUROSEMIDE 40 MG PO TABS: 40.0000 mg | ORAL_TABLET | Freq: Two times a day (BID) | ORAL | 0 refills | Status: DC

## 2023-07-21 MED ORDER — OXYCODONE-ACETAMINOPHEN 5-325 MG PO TABS
1.0000 | ORAL_TABLET | Freq: Four times a day (QID) | ORAL | 0 refills | Status: AC | PRN
Start: 2023-07-21 — End: ?

## 2023-07-21 MED ORDER — POTASSIUM CHLORIDE CRYS ER 20 MEQ PO TBCR: 20.0000 meq | EXTENDED_RELEASE_TABLET | Freq: Two times a day (BID) | ORAL | 0 refills | Status: DC

## 2023-07-21 NOTE — Patient Instructions (Signed)
Medication Instructions:   INCREASE Lasix - take one tablet ( 40 mg) by mouth twice a day.  INCREASE Potassium - Take 2 tablets (20mg ) by mouth twice a day.  *If you need a refill on your cardiac medications before your next appointment, please call your pharmacy*   Lab Work:  Your provider would like for you to return in 10 days (the week of the 16th through the 20th) to have the following labs drawn: BMP.   Please go to [facility] [address] ([facility] [address], [place] [address] You do not need an appointment.  They are open from 7:30 am-4 pm.  Lunch from 1:00 pm- 2:00 pm You DO NOT need to be fasting.  If you have labs (blood work) drawn today and your tests are completely normal, you will receive your results only by: MyChart Message (if you have MyChart) OR A paper copy in the mail If you have any lab test that is abnormal or we need to change your treatment, we will call you to review the results.   Testing/Procedures:  1.None ordered   Follow-Up: At [facility], you and your health needs are our priority.  As part of our continuing mission to provide you with exceptional heart care, we have created designated Provider Care Teams.  These Care Teams include your primary Cardiologist (physician) and Advanced Practice Providers (APPs -  Physician Assistants and Nurse Practitioners) who all work together to provide you with the care you need, when you need it.  We recommend signing up for the patient portal called "MyChart".  Sign up information is provided on this After Visit Summary.  MyChart is used to connect with patients for Virtual Visits (Telemedicine).  Patients are able to view lab/test results, encounter notes, upcoming appointments, etc.  Non-urgent messages can be sent to your provider as well.   To learn more about what you can do with MyChart, go to [email or web address].    Your next appointment:    6 weeks  Provider:    You may see Debbe Odea, MD or one of the following Advanced Practice Providers on your designated Care Team:   Nicolasa Ducking, NP Eula Listen, PA-C Cadence Fransico Michael, PA-C Charlsie Quest, NP

## 2023-07-21 NOTE — Addendum Note (Signed)
Addended by: Hannalee Castor, Hunt[place] Dec E on: 07/21/2023 04:37 PM   Modules accepted: Orders

## 2023-07-21 NOTE — Progress Notes (Signed)
Cardiology Office Note:    Date:  07/21/2023   ID:  Frances Rodgers, DOB [birth year], MRN [id number]  PCP:  Tollie Eth, NP   [facility] Providers Cardiologist:  Debbe Odea, MD     Referring MD: Tollie Eth, NP   Chief Complaint  Patient presents with   New Patient (Initial Visit)    Referred for cardiac evaluation of Diastolic heart failure with no cardiac history.  Patient reports new lower extremity edema that is painful and causing difficulty walking.      Frances Rodgers is a 52 y.o. female who is being seen today for the evaluation of leg edema at the request of Early, Sung Amabile, NP.   History of Present Illness:    Frances Rodgers is a 52 y.o. female with a hx of diabetes, asthma, GERD who presents due to leg edema.  Patient was admitted 2 months ago with symptoms of elevated blood sugars, diagnosed with an abdominal abscess.  A drain was placed for the abscess, abscess has so far strength about 50%.  Plan is to continue drain for another 30 days to see if it could resolve without surgery.  During hospitalization, she was noted to have leg swelling which has persisted.  Managed with Lasix, subsequently discharged with p.o. Lasix.  She endorses urinating appropriately after Lasix dose, but still has significant edema.  Edema is usually worse after she has been on her feet.  Swelling is much improved when she wakes up or props her legs.  Denies chest pain .has shortness of breath which he attributes to asthma.  Denies any shortness of breath with ordinary activity.  She denies any history of heart disease.  Echocardiogram 7/24 showed normal systolic function EF 70 to 75%, impaired relaxation/G1DD.  Past Medical History:  Diagnosis Date   Allergy    Anemia    Asthma    Blood transfusion without reported diagnosis 2023   GERD (gastroesophageal reflux disease)    Seasonal allergies     Past Surgical History:  Procedure Laterality Date   BIOPSY  06/21/2023    Procedure: BIOPSY;  Surgeon: Lemar Lofty., MD;  Location: [facility] ENDOSCOPY;  Service: Gastroenterology;;   ESOPHAGOGASTRODUODENOSCOPY (EGD) WITH PROPOFOL N/A 06/21/2023   Procedure: ESOPHAGOGASTRODUODENOSCOPY (EGD) WITH PROPOFOL;  Surgeon: Lemar Lofty., MD;  Location: [facility] ENDOSCOPY;  Service: Gastroenterology;  Laterality: N/A;   GASTRIC BYPASS  2015   IR PATIENT EVAL TECH 0-60 MINS  06/26/2023   IR RADIOLOGIST EVAL & MGMT  07/06/2023   IR RADIOLOGIST EVAL & MGMT  07/18/2023   NASAL POLYP SURGERY  2010   WISDOM TOOTH EXTRACTION      Current Medications: Current Meds  Medication Sig   albuterol (VENTOLIN HFA) 108 (90 Base) MCG/ACT inhaler Inhale 2 puffs into the lungs every 6 (six) hours as needed for wheezing or shortness of breath.   amoxicillin-clavulanate (AUGMENTIN) 875-125 MG tablet Take 1 tablet by mouth every 12 (twelve) hours for 25 days. Follow up with infectious disease on 9/5 for additional instructions   Blood Glucose Monitoring Suppl DEVI 1 each by Does not apply route 3 (three) times daily. May dispense any manufacturer covered by patient's insurance.   Cetirizine HCl 10 MG CAPS Take 10 mg by mouth daily as needed (allergy).   Continuous Glucose Sensor (FREESTYLE LIBRE 3 SENSOR) MISC Place 1 sensor on the skin every 14 days. Use to check glucose continuously.   FLOVENT DISKUS 50 MCG/ACT AEPB Take 1  Inhalation by mouth every 12 (twelve) hours as needed (SOB).   fluconazole (DIFLUCAN) 100 MG tablet Take 2 tablets (200 mg total) by mouth daily for 25 days. Follow up with infectious disease on 9/5 for additional recommendations   gabapentin (NEURONTIN) 300 MG capsule Take 1-2 capsules 3 times a day as needed for pain control. May cause sleepiness.   Glucose Blood (BLOOD GLUCOSE TEST STRIPS) STRP 1 each by Does not apply route 3 (three) times daily. Use as directed to check blood sugar. May dispense any manufacturer covered by patient's insurance and fits patient's device.    HYDROcodone-acetaminophen (NORCO/VICODIN) 5-325 MG tablet Take 1-2 tablets by mouth every 6 (six) hours as needed for moderate pain or severe pain. Take smallest dose possible for pain goal <5/10.   insulin glargine (LANTUS) 100 UNIT/ML Solostar Pen Inject 11 Units into the skin at bedtime. May substitute as needed per insurance.   insulin lispro (HUMALOG) 100 UNIT/ML KwikPen Inject 3 Units into the skin 3 (three) times daily with meals. Only take if eating a meal AND Blood Glucose (BG) is 80 or higher.   Insulin Pen Needle (PEN NEEDLES) 31G X 5 MM MISC 1 each by Does not apply route 3 (three) times daily. May dispense any manufacturer covered by patient's insurance.   JUNEL FE 24 1-20 MG-MCG(24) tablet TAKE 1 TABLET BY MOUTH DAILY (Patient taking differently: Take 1 tablet by mouth at bedtime.)   Lancet Device MISC 1 each by Does not apply route 3 (three) times daily. May dispense any manufacturer covered by patient's insurance.   Lancets MISC 1 each by Does not apply route 3 (three) times daily. Use as directed to check blood sugar. May dispense any manufacturer covered by patient's insurance and fits patient's device.   montelukast (SINGULAIR) 10 MG tablet Take 10 mg by mouth at bedtime.   Multiple Vitamin (MULTI VITAMIN DAILY PO) Take 1 tablet by mouth daily.   pantoprazole (PROTONIX) 40 MG tablet Take 1 tablet (40 mg total) by mouth 2 (two) times daily. Follow up with GI and/or surgery for additional instructions   QVAR REDIHALER 40 MCG/ACT inhaler Inhale 1 puff into the lungs 2 (two) times daily.   Sodium Chloride Flush (SALINE FLUSH) 0.9 % SOLN Flush drain daily with 5 ml saline   [DISCONTINUED] furosemide (LASIX) 40 MG tablet Take 1 tablet (40 mg total) by mouth daily. For your leg swelling.  Follow up with your PCP outpatient for repeat labs and to follow your swelling to determine how you'll need to take this going forward.   [DISCONTINUED] potassium chloride (KLOR-CON M) 10 MEQ tablet Take  2 tablets (20 mEq total) by mouth daily.     Allergies:   Aspirin, Ketorolac, and Ketorolac tromethamine   Social History   Socioeconomic History   Marital status: Single    Spouse name: Not on file   Number of children: Not on file   Years of education: Not on file   Highest education level: Not on file  Occupational History   Not on file  Tobacco Use   Smoking status: Never   Smokeless tobacco: Never  Vaping Use   Vaping status: Never Used  Substance and Sexual Activity   Alcohol use: Never   Drug use: Never   Sexual activity: Not Currently    Birth control/protection: Pill  Other Topics Concern   Not on file  Social History Narrative   Not on file   Social Determinants of Health   Financial  Resource Strain: Not on file  Food Insecurity: No Food Insecurity (06/16/2023)   Hunger Vital Sign    Worried About Running Out of Food in the Last Year: Never true    Ran Out of Food in the Last Year: Never true  Transportation Needs: No Transportation Needs (06/16/2023)   PRAPARE - Administrator, Civil Service (Medical): No    Lack of Transportation (Non-Medical): No  Physical Activity: Not on file  Stress: Not on file  Social Connections: Not on file     Family History: The patient's family history includes Diabetes in her brother and mother; Pancreatic cancer in her maternal grandmother; Stroke in her brother and mother.  ROS:   Please see the history of present illness.     All other systems reviewed and are negative.  EKGs/Labs/Other Studies Reviewed:    The following studies were reviewed today:  EKG Interpretation Date/Time:  Friday July 21 2023 15:01:01 EDT Ventricular Rate:  83 PR Interval:  108 QRS Duration:  74 QT Interval:  374 QTC Calculation: 439 R Axis:   21  Text Interpretation: Sinus rhythm with short PR Non-specific change in ST segment in Anterior leads Confirmed by Debbe Odea ([id number]) on 07/21/2023 3:14:40 PM    Recent  Labs: 05/19/2023: TSH 1.430 06/26/2023: Magnesium 1.7 07/05/2023: BNP 97.4 07/20/2023: ALT 18; BUN 7; Creat 0.43; Hemoglobin 8.6; Platelets 305; Potassium 4.0; Sodium 135  Recent Lipid Panel    Component Value Date/Time   CHOL 81 06/07/2023 2151   CHOL 147 09/30/2022 1457   TRIG 86 06/19/2023 0856   HDL <10 (L) 06/07/2023 2151   HDL 37 (L) 09/30/2022 1457   CHOLHDL NOT CALCULATED 06/07/2023 2151   VLDL 38 06/07/2023 2151   LDLCALC NOT CALCULATED 06/07/2023 2151   LDLCALC 65 09/30/2022 1457     Risk Assessment/Calculations:             Physical Exam:    VS:  BP 132/72 (BP Location: Left Arm, Patient Position: Sitting, Cuff Size: Large)   Pulse 83   Ht 5\' 6"  (1.676 m)   Wt 219 lb 6.4 oz (99.5 kg)   LMP 06/08/2023   SpO2 100%   BMI 35.41 kg/m     Wt Readings from Last 3 Encounters:  07/21/23 219 lb 6.4 oz (99.5 kg)  07/20/23 220 lb 6.4 oz (100 kg)  07/05/23 216 lb (98 kg)     GEN:  Well nourished, well developed in no acute distress HEENT: Normal NECK: No JVD; No carotid bruits CARDIAC: RRR, no murmurs, rubs, gallops RESPIRATORY:  Clear to auscultation without rales, wheezing or rhonchi  ABDOMEN: Soft, mild tender, abdominal drain noted MUSCULOSKELETAL: 2+ pitting edema SKIN: Warm and dry NEUROLOGIC:  Alert and oriented x 3 PSYCHIATRIC:  Normal affect   ASSESSMENT:    1. Diastolic dysfunction   2. Leg edema    PLAN:    In order of problems listed above:  Leg edema, grade 1 diastolic dysfunction, EF normal.  Appears dependent, patient may have venous insufficiency, low protein/hypoalbuminemia might be contributing.  Increase Lasix to 40 mg twice daily.  Check BMP in 10 days.  Follow-up in 4 to 6 weeks       Medication Adjustments/Labs and Tests Ordered: Current medicines are reviewed at length with the patient today.  Concerns regarding medicines are outlined above.  Orders Placed This Encounter  Procedures   Basic Metabolic Panel (BMET)   AMB referral  to CHF clinic  EKG 12-Lead   Meds ordered this encounter  Medications   furosemide (LASIX) 40 MG tablet    Sig: Take 1 tablet (40 mg total) by mouth 2 (two) times daily. For your leg swelling.    Dispense:  90 tablet    Refill:  0   potassium chloride (KLOR-CON M) 20 MEQ tablet    Sig: Take 1 tablet (20 mEq total) by mouth 2 (two) times daily.    Dispense:  270 tablet    Refill:  0    Patient Instructions  Medication Instructions:   INCREASE Lasix - take one tablet ( 40 mg) by mouth twice a day.  INCREASE Potassium - Take 2 tablets (20mg ) by mouth twice a day.  *If you need a refill on your cardiac medications before your next appointment, please call your pharmacy*   Lab Work:  Your provider would like for you to return in 10 days (the week of the 16th through the 20th) to have the following labs drawn: BMP.   Please go to [facility] [address] ([facility] [address], [place] [address] You do not need an appointment.  They are open from 7:30 am-4 pm.  Lunch from 1:00 pm- 2:00 pm You DO NOT need to be fasting.  If you have labs (blood work) drawn today and your tests are completely normal, you will receive your results only by: MyChart Message (if you have MyChart) OR A paper copy in the mail If you have any lab test that is abnormal or we need to change your treatment, we will call you to review the results.   Testing/Procedures:  1.None ordered   Follow-Up: At [facility], you and your health needs are our priority.  As part of our continuing mission to provide you with exceptional heart care, we have created designated Provider Care Teams.  These Care Teams include your primary Cardiologist (physician) and Advanced Practice Providers (APPs -  Physician Assistants and Nurse Practitioners) who all work together to provide you with the care you need, when you need it.  We recommend signing up for the patient portal called "MyChart".   Sign up information is provided on this After Visit Summary.  MyChart is used to connect with patients for Virtual Visits (Telemedicine).  Patients are able to view lab/test results, encounter notes, upcoming appointments, etc.  Non-urgent messages can be sent to your provider as well.   To learn more about what you can do with MyChart, go to [email or web address].    Your next appointment:    6 weeks  Provider:   You may see Debbe Odea, MD or one of the following Advanced Practice Providers on your designated Care Team:   Nicolasa Ducking, NP Eula Listen, PA-C Cadence Fransico Michael, PA-C Charlsie Quest, NP   Signed, Debbe Odea, MD  07/21/2023 4:20 PM    [facility]

## 2023-07-22 ENCOUNTER — Encounter: Payer: Self-pay | Admitting: Nurse Practitioner

## 2023-07-23 ENCOUNTER — Encounter: Payer: Self-pay | Admitting: Internal Medicine

## 2023-07-28 ENCOUNTER — Other Ambulatory Visit: Payer: Self-pay | Admitting: Surgery

## 2023-07-28 ENCOUNTER — Ambulatory Visit
Admission: RE | Admit: 2023-07-28 | Discharge: 2023-07-28 | Disposition: A | Discharge status: Home / Self Care | Payer: 59 | Source: Ambulatory Visit | Attending: Interventional Radiology

## 2023-07-28 DIAGNOSIS — K255 Chronic or unspecified gastric ulcer with perforation: Secondary | ICD-10-CM

## 2023-07-28 HISTORY — PX: IR RADIOLOGIST EVAL & MGMT: IMG5224

## 2023-07-28 NOTE — Therapy (Signed)
OUTPATIENT PHYSICAL THERAPY EVALUATION   Patient Name: Frances Rodgers MRN: [id number] DOB:Jul 06, 1971, 52 y.o., female Today's Date: 07/31/2023  END OF SESSION:  PT End of Session - 07/31/23 1316     Visit Number 1    Number of Visits 17    Date for PT Re-Evaluation 09/25/23    Authorization Type [employer]    PT Start Time 1316    PT Stop Time 1402    PT Time Calculation (min) 46 min    Activity Tolerance Patient tolerated treatment well;Patient limited by fatigue    Behavior During Therapy [facility] for tasks assessed/performed             Past Medical History:  Diagnosis Date   Allergy    Anemia    Asthma    Blood transfusion without reported diagnosis 2023   GERD (gastroesophageal reflux disease)    Seasonal allergies    Past Surgical History:  Procedure Laterality Date   BIOPSY  06/21/2023   Procedure: BIOPSY;  Surgeon: Lemar Lofty., MD;  Location: [facility] ENDOSCOPY;  Service: Gastroenterology;;   ESOPHAGOGASTRODUODENOSCOPY (EGD) WITH PROPOFOL N/A 06/21/2023   Procedure: ESOPHAGOGASTRODUODENOSCOPY (EGD) WITH PROPOFOL;  Surgeon: Lemar Lofty., MD;  Location: [facility] ENDOSCOPY;  Service: Gastroenterology;  Laterality: N/A;   GASTRIC BYPASS  2015   IR PATIENT EVAL TECH 0-60 MINS  06/26/2023   IR RADIOLOGIST EVAL & MGMT  07/06/2023   IR RADIOLOGIST EVAL & MGMT  07/18/2023   IR RADIOLOGIST EVAL & MGMT  07/28/2023   NASAL POLYP SURGERY  2010   WISDOM TOOTH EXTRACTION     Patient Active Problem List   Diagnosis Date Noted   DM (diabetes mellitus), type 2 with complications (HCC) 07/05/2023   Diastolic heart failure (HCC) 07/05/2023   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   Transaminitis 06/12/2023   Nausea & vomiting 06/08/2023   AMS (altered mental status) 06/07/2023   Positive D dimer 05/30/2023   Obesity (BMI 30-39.9) 05/30/2023   Sepsis (HCC) 05/29/2023   Acute urinary retention 05/28/2023   Abnormal urinalysis 05/28/2023   Elevated blood pressure reading  05/28/2023   Acute metabolic encephalopathy 05/28/2023   Hypernatremia 05/28/2023   DKA (diabetic ketoacidosis) (HCC) 05/27/2023   Hyperkalemia 05/27/2023   AKI (acute kidney injury) (HCC) 05/27/2023   GERD without esophagitis 05/27/2023   Shoulder pain, left 03/09/2023   Derangement of left shoulder joint 03/09/2023   Asthma 10/01/2021   Gastroesophageal reflux disease 10/01/2021   Iron deficiency anemia 07/09/2020   Moderate obesity 07/09/2020   Menorrhagia with regular cycle 07/09/2020    PCP: Tollie Eth, NP  REFERRING PROVIDER: Tollie Eth, NP  REFERRING DIAG: R53.1 (ICD-10-CM) - Weakness R26.89 (ICD-10-CM) - Other abnormalities of gait and mobility  Rationale for Evaluation and Treatment: Rehabilitation  THERAPY DIAG:  Muscle weakness (generalized)  Other abnormalities of gait and mobility  ONSET DATE: Discharged August 12th  SUBJECTIVE:  SUBJECTIVE STATEMENT: Pt arrives accompanied by daughter per her request. Pt states that she began to develop difficulty walking due to pain with gravity dependent edema. This has begun to affect her basic mobility and household tasks. She also notes limited ability to mobilize during recent hospitalization and feels this has affected her balance and activity tolerance. She notes that standing tends to be a bit worse than walking, has to take breaks every 8-14min or so. Was using a walker but has been using [facility] for past couple of weeks. She states she will get out of breath with activity but denies any other cardiac symptoms - had recent follow up with cardiology. She states her activity tolerance is slowly improving but wants to continue to work on it. Still has a LLQ drain which she is hopeful will be removed by the end of the month.  Most difficult  reported activities: showering, walking, standing, dressing, dishes, laundry    PERTINENT HISTORY:  dependent LE edema, asthma, GERD, gastric perforation, DM2 w/ hx DKA, hx sepsis  PAIN:  Reports pain w/ drain - lessened since it was initially put in. On medication for it which is reportedly helpful. 4-9/10 range on NPS. She denies any redness, swelling, fevers/chills re: drain  PRECAUTIONS: cardiac hx, LLQ drain   WEIGHT BEARING RESTRICTIONS: No  FALLS:  Has patient fallen in last 6 months? No - pt denies overt falls but does endorse one instance of trying to reach a chair and having a controlled slide to floor  LIVING ENVIRONMENT: Lives in [employer], has to navigate 17 stairs 1 rail + bannister. All one level once inside Lives alone - does all of housework independently, since hospitalization has had some assist from daughter Walk in shower w/ seat Has a walker, has been on cane for a couple weeks.   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  PLOF: Independent  PATIENT GOALS: be able to go up stairs without UE support, be able to lift legs more normally  NEXT MD VISIT: PCP 9/26, sees GI 9/20 to discuss drain removal   OBJECTIVE:   DIAGNOSTIC FINDINGS:  CT AP pending for 08/03/23 per epic review  PATIENT SURVEYS:  FOTO 35 current, 55 predicted  COGNITION: Overall cognitive status: Within functional limits for tasks assessed     SENSATION: NT   POSTURE: mildly guarded UE/UT posture in seated  PALPATION: Not indicated  RANGE OF MOTION:     Active  Right eval Left eval  Shoulder flexion    Functional ER combo    Functional IR combo    Knee extension    Ankle dorsiflexion     (Blank rows = not tested) (Key: WFL = within functional limits not formally assessed, * = concordant pain, s = stiffness/stretching sensation, NT = not tested)  Comments:    STRENGTH TESTING:  MMT Right eval Left eval   Shoulder flexion    Shoulder abduction    Elbow flexion    Elbow extension    Grip strength (gross)    Hip flexion    Hip abduction (modified sitting)    Knee flexion    Knee extension    Ankle dorsiflexion    Ankle plantarflexion     (Blank rows = not tested) (Key: WFL = within functional limits not formally assessed, * = concordant pain, s = stiffness/stretching sensation, NT = not tested)  Comments:    FUNCTIONAL TESTS:  Resting vitals: 87-93bpm, SpO2 98-99% 5xSTS:  20.15sec HR up to 108bpm, SpO2 stable, recovers to baseline quickly TUG: 24.56sec no AD, no significant change in HR or SpO2  GAIT: Distance walked: within clinic Assistive device utilized: Single point cane Level of assistance: Modified independence Comments: partial step through patten, SPC in RUE, increased time/effort  TODAY'S TREATMENT:                                                                                                                              [facility] PT Treatment:                                                DATE: 07/31/23 Therapeutic Exercise: Heel raises x10 at counter HEP handout + education w/ education on safe setup at home    PATIENT EDUCATION:  Education details: Pt education on PT impairments, prognosis, and POC. Informed consent. Rationale for interventions, safe/appropriate HEP performance Person educated: Patient Education method: Explanation, Demonstration, Tactile cues, Verbal cues, and Handouts Education comprehension: verbalized understanding, returned demonstration, verbal cues required, tactile cues required, and needs further education    HOME EXERCISE PROGRAM: Access Code: [email or web address] URL: [email or web address] Date: 07/31/2023 Prepared by: Fransisco Hertz  Exercises - Sit to Stand with Armchair  - 2-3 x daily - 7 x weekly - 1 sets - 5 reps - Heel Raises with Counter Support  - 2-3 x daily - 7 x weekly - 1 sets - 10 reps  ASSESSMENT:  CLINICAL  IMPRESSION: Patient is a pleasant 52 y.o. woman who was seen today for physical therapy evaluation and treatment for weakness and mobility limitations. She endorses difficulty with basic mobility and household tasks since hospitalization. On exam she demonstrates functional weakness and reduced activity tolerance as evidenced by TUG time, 5xSTS time, and functional kinematics. No adverse events, does well with introductory HEP although she does endorse some muscular fatigue. Recommend trial of skilled PT to address aforementioned deficits with aim of improving functional tolerance and reducing pain with typical activities. Pt departs today's session in no acute distress, all voiced concerns/questions addressed appropriately from PT perspective.      OBJECTIVE IMPAIRMENTS: Abnormal gait, decreased activity tolerance, decreased balance, decreased endurance, decreased mobility, difficulty walking, decreased strength, improper body mechanics, and pain.   ACTIVITY LIMITATIONS: carrying, lifting, bending, standing, squatting, stairs, transfers, bathing, dressing, hygiene/grooming, and locomotion level  PARTICIPATION LIMITATIONS: meal prep, cleaning, laundry, community activity, and occupation  PERSONAL FACTORS: Time since onset of injury/illness/exacerbation and 3+ comorbidities: DM, asthma, gastric perforation  are also affecting patient's functional outcome.   REHAB POTENTIAL: Fair given comorbidities  CLINICAL DECISION MAKING: Evolving/moderate complexity  EVALUATION COMPLEXITY: Moderate   GOALS: Goals reviewed with patient? Yes  SHORT TERM GOALS: Target date: 08/28/2023    Pt will demonstrate appropriate understanding and performance of initially prescribed HEP in order to facilitate  improved independence with management of symptoms.  Baseline: HEP provided on eval Goal status: INITIAL   2. Pt will score greater than or equal to 45 on FOTO in order to demonstrate improved perception of  function due to symptoms.  Baseline: 35  Goal status: INITIAL    LONG TERM GOALS: Target date: 09/25/2023   Pt will score 55 on FOTO in order to demonstrate improved perception of functional status due to symptoms.  Baseline: 35 Goal status: INITIAL  2.  Pt will be able to perform TUG in less than or equal to 12 sec with LRAD in order to indicate reduced risk of falling (cutoff score for fall risk 13.5 sec in community dwelling older adults per [facility] et al, 2000)  Baseline: 24sec no AD  Goal status: INITIAL    4. Pt will perform 5xSTS in <12 sec in order to demonstrate reduced fall risk and improved functional independence. (MCID of 2.3sec)  Baseline: 20sec  Goal status: INITIAL   5. Pt will be able to safely navigate up to 17 stairs w/o UE support in order to facilitate improved home access.  Baseline: able to enter home w/ UE support and significantly increased time/effort, per pt report  Goal status: INITIAL  6. Pt will demonstrate appropriate performance of final prescribed HEP in order to facilitate improved self-management of symptoms post-discharge.   Baseline: initial HEP prescribed  Goal status: INITIAL    PLAN:  PT FREQUENCY: 1-2x/week  PT DURATION: 8 weeks  PLANNED INTERVENTIONS: Therapeutic exercises, Therapeutic activity, Neuromuscular re-education, Balance training, Gait training, Patient/Family education, Self Care, Joint mobilization, Stair training, Cryotherapy, Moist heat, Taping, Manual therapy, and Re-evaluation.  PLAN FOR NEXT SESSION: Review/update HEP PRN. Work on strength exercises as appropriate with emphasis on functional mobility, transfer mechanics, and activity tolerance. Would likely benefit from monitoring SpO2/HR with heavier activity   Ashley Murrain PT, DPT 07/31/2023 4:18 PM

## 2023-07-28 NOTE — Progress Notes (Signed)
IR brief note   The patient has remote history of gastric sleeve resection with recent complication of perforation followed by abdominal abscess formation and percutaneous drain placement on June 16, 2023.  The patient was most recently evaluated by [facility] (Dr.  Ivar Drape), who recommended to the patient to keep the drain in for at least 1 more month, in hope of resolution of the cavity followed by surgical revision.  The patient continues to report minimal liquid output from the percutaneous drainage catheter since her last visit and CT scan on July 18, 2023.  She reports that there is some occasional filling of the drainage bulb with air.  We will plan to see the patient again in approximately 2-3 weeks from her last visit with repeat CT abdomen and pelvis with oral contrast.    Plan:  Follow-up in IR drain clinic in 2 weeks from last visit with repeat CT abdomen pelvis with oral contrast.    Olive Bass, MD  Vascular and Interventional Radiology 07/28/2023 9:06 AM

## 2023-07-31 ENCOUNTER — Ambulatory Visit: Payer: 59 | Attending: Nurse Practitioner | Admitting: Physical Therapy

## 2023-07-31 ENCOUNTER — Encounter: Payer: 59 | Admitting: Family

## 2023-07-31 ENCOUNTER — Encounter: Payer: Self-pay | Admitting: Physical Therapy

## 2023-07-31 ENCOUNTER — Other Ambulatory Visit: Payer: Self-pay

## 2023-07-31 DIAGNOSIS — R531 Weakness: Secondary | ICD-10-CM | POA: Diagnosis not present

## 2023-07-31 DIAGNOSIS — R2689 Other abnormalities of gait and mobility: Secondary | ICD-10-CM | POA: Diagnosis present

## 2023-07-31 DIAGNOSIS — I5030 Unspecified diastolic (congestive) heart failure: Secondary | ICD-10-CM | POA: Diagnosis not present

## 2023-07-31 DIAGNOSIS — M6281 Muscle weakness (generalized): Secondary | ICD-10-CM | POA: Insufficient documentation

## 2023-08-01 ENCOUNTER — Ambulatory Visit: Payer: 59 | Attending: Family | Admitting: Family

## 2023-08-01 ENCOUNTER — Encounter: Payer: Self-pay | Admitting: Family

## 2023-08-01 VITALS — BP 113/61 | HR 93 | Ht 66.0 in | Wt 197.0 lb

## 2023-08-01 DIAGNOSIS — E1110 Type 2 diabetes mellitus with ketoacidosis without coma: Secondary | ICD-10-CM | POA: Diagnosis not present

## 2023-08-01 DIAGNOSIS — Z79899 Other long term (current) drug therapy: Secondary | ICD-10-CM | POA: Diagnosis not present

## 2023-08-01 DIAGNOSIS — I428 Other cardiomyopathies: Secondary | ICD-10-CM | POA: Diagnosis present

## 2023-08-01 DIAGNOSIS — I5032 Chronic diastolic (congestive) heart failure: Secondary | ICD-10-CM | POA: Insufficient documentation

## 2023-08-01 DIAGNOSIS — R531 Weakness: Secondary | ICD-10-CM | POA: Diagnosis not present

## 2023-08-01 DIAGNOSIS — J45909 Unspecified asthma, uncomplicated: Secondary | ICD-10-CM | POA: Insufficient documentation

## 2023-08-01 DIAGNOSIS — E119 Type 2 diabetes mellitus without complications: Secondary | ICD-10-CM | POA: Diagnosis not present

## 2023-08-01 DIAGNOSIS — Z794 Long term (current) use of insulin: Secondary | ICD-10-CM | POA: Insufficient documentation

## 2023-08-01 DIAGNOSIS — R600 Localized edema: Secondary | ICD-10-CM | POA: Insufficient documentation

## 2023-08-01 DIAGNOSIS — E669 Obesity, unspecified: Secondary | ICD-10-CM | POA: Diagnosis not present

## 2023-08-01 NOTE — Progress Notes (Signed)
Advanced Heart Failure Clinic Note   Referring Physician: Debbe Odea, MD PCP: Tollie Eth, NP (last seen 08/24) Cardiologist: Debbe Odea, MD (last seen 09/24)  HPI:  Frances Rodgers is a 52 y/o female with a history of DM (07/24), gastric sleeve (2015), asthma  Admitted 05/30/23 due to acute onset of altered mental status. Found to have severe DKA. Initially placed on insulin drip. Diabetic teaching done and transitioned to SQ insulin. Admitted 06/07/23 due to altered mental status.  She was found to have a contained gastric perforation. S/p drain placement by IR. 7/31 had large LUQ gas and fluid collection measuring 10.1 cm lateral to the stomach concerning for contained perforation, leak, and or abscess s/p placement of 12 french drainage catheter into LUQ abscess. Culture with klebsiella, lactobacillus, mixed anaerobic flora. 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 Erythematous mucosa in stomach, biopsied. MRCP 7/21 revealed a mild intrahepatic bile duct dilatation with a focal area of narrowing involving the proximal common bile duct possibly representing a stricture.    Echo 06/08/23: EF 70-75% with mild LVH, Grade I DD and trivial MR  She presents today for her initial visit with a chief complaint of minimal fatigue with moderate exertion. Chronic in nature. Has associated leg weakness (currently having PT) and pedal edema (improving) along with this. Denies chest pain, cough, palpitations, abdominal distention, dizziness or weight gain. Has difficulty sleeping but says that it's due to the discomfort from abdominal drain that is still in place. She says that it hurts to walk at times due to recent edema in legs/ thighs. Has GI appt at [facility] on Friday regarding drain.   Not adding salt, doesn't eat fast food. Currently receiving PT  due to leg weakness. Was diagnosed with diabetes after finishing prednisone for a shoulder issues.   Review of Systems: [y] = yes, [ ]  = no   General: Weight gain [ ] ; Weight loss [ ] ; Anorexia [ ] ; Fatigue Cove.Etienne ]; Fever [ ] ; Chills [ ] ; Weakness Cove.Etienne ]  Cardiac: Chest pain/pressure [ ] ; Resting SOB [ ] ; Exertional SOB [ ] ; Orthopnea [ ] ; Pedal Edema Cove.Etienne ]; Palpitations [ ] ; Syncope [ ] ; Presyncope [ ] ; Paroxysmal nocturnal dyspnea[ ]   Pulmonary: Cough [ ] ; Wheezing[ ] ; Hemoptysis[ ] ; Sputum [ ] ; Snoring [ ]   GI: Vomiting[ ] ; Dysphagia[ ] ; Melena[ ] ; Hematochezia [ ] ; Heartburn[ ] ; Abdominal pain [ ] ; Constipation [ ] ; Diarrhea [ ] ; BRBPR [ ]   GU: Hematuria[ ] ; Dysuria [ ] ; Nocturia[ ]   Vascular: Pain in legs with walking [ ] ; Pain in feet with lying flat [ ] ; Non-healing sores [ ] ; Stroke [ ] ; TIA [ ] ; Slurred speech [ ] ;  Neuro: Headaches[ ] ; Vertigo[ ] ; Seizures[ ] ; Paresthesias[ ] ;Blurred vision [ ] ; Diplopia [ ] ; Vision changes [ ]   Ortho/Skin: Arthritis [ ] ; Joint pain [ ] ; Muscle pain [ ] ; Joint swelling [ ] ; Back Pain [ ] ; Rash [ ]   Psych: Depression[ ] ; Anxiety[ ]   Heme: Bleeding problems [ ] ; Clotting disorders [ ] ; Anemia [ ]   Endocrine: Diabetes [y]; Thyroid dysfunction[ ]    Past Medical History:  Diagnosis Date   Allergy    Anemia    Asthma    Blood transfusion without reported diagnosis 2023   GERD (gastroesophageal reflux disease)    Seasonal allergies  Current Outpatient Medications  Medication Sig Dispense Refill   albuterol (VENTOLIN HFA) 108 (90 Base) MCG/ACT inhaler Inhale 2 puffs into the lungs every 6 (six) hours as needed for wheezing or shortness of breath.     amoxicillin-clavulanate (AUGMENTIN) 875-125 MG tablet Take 1 tablet by mouth every 12 (twelve) hours for 25 days. Follow up with infectious disease on 9/5 for additional instructions 50 tablet 0   Blood Glucose Monitoring Suppl DEVI 1 each by Does not apply route 3 (three) times daily. May dispense any  manufacturer covered by patient's insurance. 1 each 0   Cetirizine HCl 10 MG CAPS Take 10 mg by mouth daily as needed (allergy).     Continuous Glucose Sensor (FREESTYLE LIBRE 3 SENSOR) MISC Place 1 sensor on the skin every 14 days. Use to check glucose continuously. 2 each 11   FLOVENT DISKUS 50 MCG/ACT AEPB Take 1 Inhalation by mouth every 12 (twelve) hours as needed (SOB).     fluconazole (DIFLUCAN) 100 MG tablet Take 2 tablets (200 mg total) by mouth daily for 25 days. Follow up with infectious disease on 9/5 for additional recommendations 50 tablet 0   furosemide (LASIX) 40 MG tablet Take 1 tablet (40 mg total) by mouth 2 (two) times daily. For your leg swelling. 90 tablet 0   gabapentin (NEURONTIN) 300 MG capsule Take 1-2 capsules 3 times a day as needed for pain control. May cause sleepiness. 180 capsule 1   Glucose Blood (BLOOD GLUCOSE TEST STRIPS) STRP 1 each by Does not apply route 3 (three) times daily. Use as directed to check blood sugar. May dispense any manufacturer covered by patient's insurance and fits patient's device. 100 strip 1   insulin glargine (LANTUS) 100 UNIT/ML Solostar Pen Inject 11 Units into the skin at bedtime. May substitute as needed per insurance. 15 mL 1   insulin lispro (HUMALOG) 100 UNIT/ML KwikPen Inject 3 Units into the skin 3 (three) times daily with meals. Only take if eating a meal AND Blood Glucose (BG) is 80 or higher. 15 mL 1   Insulin Pen Needle (PEN NEEDLES) 31G X 5 MM MISC 1 each by Does not apply route 3 (three) times daily. May dispense any manufacturer covered by patient's insurance. 100 each 1   JUNEL FE 24 1-20 MG-MCG(24) tablet TAKE 1 TABLET BY MOUTH DAILY (Patient taking differently: Take 1 tablet by mouth at bedtime.) 84 tablet 3   Lancet Device MISC 1 each by Does not apply route 3 (three) times daily. May dispense any manufacturer covered by patient's insurance. 100 each 1   Lancets MISC 1 each by Does not apply route 3 (three) times daily. Use  as directed to check blood sugar. May dispense any manufacturer covered by patient's insurance and fits patient's device. 100 each 0   montelukast (SINGULAIR) 10 MG tablet Take 10 mg by mouth at bedtime.     Multiple Vitamin (MULTI VITAMIN DAILY PO) Take 1 tablet by mouth daily.     oxyCODONE-acetaminophen (PERCOCET/ROXICET) 5-325 MG tablet Take 1 tablet by mouth every 6 (six) hours as needed for severe pain. 120 tablet 0   pantoprazole (PROTONIX) 40 MG tablet Take 1 tablet (40 mg total) by mouth 2 (two) times daily. Follow up with GI and/or surgery for additional instructions 120 tablet 0   potassium chloride (KLOR-CON M) 20 MEQ tablet Take 1 tablet (20 mEq total) by mouth 2 (two) times daily. 270 tablet 0   QVAR REDIHALER 40 MCG/ACT inhaler Inhale 1  puff into the lungs 2 (two) times daily.     Sodium Chloride Flush (SALINE FLUSH) 0.9 % SOLN Flush drain daily with 5 ml saline 140 mL 0   No current facility-administered medications for this visit.    Allergies  Allergen Reactions   Aspirin Anaphylaxis and Shortness Of Breath   Ketorolac Anaphylaxis   Ketorolac Tromethamine Shortness Of Breath      Social History   Socioeconomic History   Marital status: Single    Spouse name: Not on file   Number of children: Not on file   Years of education: Not on file   Highest education level: Not on file  Occupational History   Not on file  Tobacco Use   Smoking status: Never   Smokeless tobacco: Never  Vaping Use   Vaping status: Never Used  Substance and Sexual Activity   Alcohol use: Never   Drug use: Never   Sexual activity: Not Currently    Birth control/protection: Pill  Other Topics Concern   Not on file  Social History Narrative   Not on file   Social Determinants of Health   Financial Resource Strain: Not on file  Food Insecurity: No Food Insecurity (06/16/2023)   Hunger Vital Sign    Worried About Running Out of Food in the Last Year: Never true    Ran Out of Food in  the Last Year: Never true  Transportation Needs: No Transportation Needs (06/16/2023)   PRAPARE - Administrator, Civil Service (Medical): No    Lack of Transportation (Non-Medical): No  Physical Activity: Not on file  Stress: Not on file  Social Connections: Not on file  Intimate Partner Violence: Not At Risk (06/16/2023)   Humiliation, Afraid, Rape, and Kick questionnaire    Fear of Current or Ex-Partner: No    Emotionally Abused: No    Physically Abused: No    Sexually Abused: No      Family History  Problem Relation Age of Onset   Diabetes Mother    Stroke Mother    Diabetes Brother    Stroke Brother    Pancreatic cancer Maternal Grandmother    Vitals:   08/01/23 1125  BP: 113/61  Pulse: 93  SpO2: 100%  Weight: 197 lb (89.4 kg)  Height: 5\' 6"  (1.676 m)   Wt Readings from Last 3 Encounters:  08/01/23 197 lb (89.4 kg)  07/21/23 219 lb 6.4 oz (99.5 kg)  07/20/23 220 lb 6.4 oz (100 kg)   Lab Results  Component Value Date   CREATININE 0.43 (L) 07/20/2023   CREATININE 0.53 (L) 07/05/2023   CREATININE 0.66 06/26/2023   PHYSICAL EXAM: General:  Well appearing. No respiratory difficulty HEENT: normal Neck: supple. no JVD. No lymphadenopathy or thyromegaly appreciated. Cor: PMI nondisplaced. Regular rate & rhythm. No rubs, gallops or murmurs. Lungs: clear Abdomen: soft, nontender, nondistended. No hepatosplenomegaly. No bruits or masses.  Extremities: no cyanosis, clubbing, rash, 2+ pitting edema bilateral lower legs Neuro: alert & oriented x 3, cranial nerves grossly intact. moves all 4 extremities w/o difficulty. Affect pleasant.  ECG: not done   ASSESSMENT & PLAN:  1: NICM with preserved ejection fraction- - suspect due to obesity/ recently diagnosed DM - NYHA class II - euvolemic - weighing daily and notes dramatic weight loss since diuretic was increased - Echo 06/08/23: EF 70-75% with mild LVH, Grade I DD and trivial MR - saw cardiology  (Agbor-Etang) 09/24 - continue furosemide 40mg  BID/  potassium  BID - BMP today - discussed SGLT2 but will defer for now - BNP 06/21/23 was 120  2: Leg edema- - BMP 07/20/23 showed sodium 135, potassium 4.0, creatinine 0.43 & GFR 118 - possible venous insufficiency - already wearing compression socks during the day with removal at bedtime  3: Obesity- - had gastric sleeve 2015 - currently has abdominal drain in due to gastric abscess/ perforation - has GI appt later this week  4: DM- - A1c 05/28/23 was 11.7% - saw PCP (Early) 08/24 - checking glucose at home and running 80's-120's  Return in 2 months, sooner if needed.    Delma Freeze, FNP 08/01/23

## 2023-08-02 ENCOUNTER — Other Ambulatory Visit: Payer: Self-pay | Admitting: Surgery

## 2023-08-02 ENCOUNTER — Encounter: Payer: Self-pay | Admitting: Family

## 2023-08-02 DIAGNOSIS — K255 Chronic or unspecified gastric ulcer with perforation: Secondary | ICD-10-CM

## 2023-08-02 DIAGNOSIS — K651 Peritoneal abscess: Secondary | ICD-10-CM

## 2023-08-02 LAB — HM DIABETES EYE EXAM

## 2023-08-03 ENCOUNTER — Ambulatory Visit
Admission: RE | Admit: 2023-08-03 | Discharge: 2023-08-03 | Disposition: A | Discharge status: Home / Self Care | Payer: 59 | Source: Ambulatory Visit | Attending: Surgery | Admitting: Surgery

## 2023-08-03 DIAGNOSIS — K255 Chronic or unspecified gastric ulcer with perforation: Secondary | ICD-10-CM

## 2023-08-03 DIAGNOSIS — K651 Peritoneal abscess: Secondary | ICD-10-CM

## 2023-08-03 HISTORY — PX: IR RADIOLOGIST EVAL & MGMT: IMG5224

## 2023-08-08 NOTE — Therapy (Addendum)
OUTPATIENT PHYSICAL THERAPY TREATMENT NOTE + NO VISIT DISCHARGE SUMMARY (see below)    Patient Name: Frances Rodgers MRN: [id number] DOB:Aug 15, 1971, 52 y.o., female Today's Date: 08/09/2023  END OF SESSION:  PT End of Session - 08/09/23 1442     Visit Number 2    Number of Visits 17    Date for PT Re-Evaluation 09/25/23    Authorization Type U[employer]    PT Start Time 1445    PT Stop Time 1526    PT Time Calculation (min) 41 min    Activity Tolerance Patient tolerated treatment well    Behavior During Therapy [facility] for tasks assessed/performed              Past Medical History:  Diagnosis Date   Allergy    Anemia    Asthma    Blood transfusion without reported diagnosis 2023   GERD (gastroesophageal reflux disease)    Seasonal allergies    Past Surgical History:  Procedure Laterality Date   BIOPSY  06/21/2023   Procedure: BIOPSY;  Surgeon: Lemar Lofty., MD;  Location: [facility] ENDOSCOPY;  Service: Gastroenterology;;   ESOPHAGOGASTRODUODENOSCOPY (EGD) WITH PROPOFOL N/A 06/21/2023   Procedure: ESOPHAGOGASTRODUODENOSCOPY (EGD) WITH PROPOFOL;  Surgeon: Lemar Lofty., MD;  Location[facility] ENDOSCOPY;  Service: Gastroenterology;  Laterality: N/A;   GASTRIC BYPASS  2015   IR PATIENT EVAL TECH 0-60 MINS  06/26/2023   IR RADIOLOGIST EVAL & MGMT  07/06/2023   IR RADIOLOGIST EVAL & MGMT  07/18/2023   IR RADIOLOGIST EVAL & MGMT  07/28/2023   IR RADIOLOGIST EVAL & MGMT  08/03/2023   NASAL POLYP SURGERY  2010   WISDOM TOOTH EXTRACTION     Patient Active Problem List   Diagnosis Date Noted   DM (diabetes mellitus), type 2 with complications (HCC) 07/05/2023   Diastolic heart failure (HCC) 07/05/2023   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   Transaminitis 06/12/2023   Nausea & vomiting 06/08/2023   AMS (altered mental status) 06/07/2023   Positive D dimer 05/30/2023   Obesity (BMI 30-39.9) 05/30/2023   Sepsis (HCC) 05/29/2023   Acute urinary retention 05/28/2023   Abnormal  urinalysis 05/28/2023   Elevated blood pressure reading 05/28/2023   Acute metabolic encephalopathy 05/28/2023   Hypernatremia 05/28/2023   DKA (diabetic ketoacidosis) (HCC) 05/27/2023   Hyperkalemia 05/27/2023   AKI (acute kidney injury) (HCC) 05/27/2023   GERD without esophagitis 05/27/2023   Shoulder pain, left 03/09/2023   Derangement of left shoulder joint 03/09/2023   Asthma 10/01/2021   Gastroesophageal reflux disease 10/01/2021   Iron deficiency anemia 07/09/2020   Moderate obesity 07/09/2020   Menorrhagia with regular cycle 07/09/2020    PCP: Tollie Eth, NP  REFERRING PROVIDER: Tollie Eth, NP  REFERRING DIAG: R53.1 (ICD-10-CM) - Weakness R26.89 (ICD-10-CM) - Other abnormalities of gait and mobility  Rationale for Evaluation and Treatment: Rehabilitation  THERAPY DIAG:  Muscle weakness (generalized)  Other abnormalities of gait and mobility  ONSET DATE: Discharged August 12th  SUBJECTIVE:  Per eval - Pt arrives accompanied by daughter per her request. Pt states that she began to develop difficulty walking due to pain with gravity dependent edema. This has begun to affect her basic mobility and household tasks. She also notes limited ability to mobilize during recent hospitalization and feels this has affected her balance and activity tolerance. She notes that standing tends to be a bit worse than walking, has to take breaks every 8-32min or so. Was using a walker but has been using [facility] for past couple of weeks. She states she will get out of breath with activity but denies any other cardiac symptoms - had recent follow up with cardiology. She states her activity tolerance is slowly improving but wants to continue to work on it. Still has a LLQ drain which she is hopeful will be removed  by the end of the month.  Most difficult reported activities: showering, walking, standing, dressing, dishes, laundry   SUBJECTIVE STATEMENT: 08/09/2023 Pt states she is having a bit more pain over the past week, has had a bit more drainage around her insertion site and may be getting surgery in the next couple weeks. Seeing PCP tomorrow. Got a CT of her abdomen and she states it was reassuring overall   PERTINENT HISTORY:  dependent LE edema, asthma, GERD, gastric perforation, DM2 w/ hx DKA, hx sepsis  PAIN:  Reports pain w/ drain - lessened since it was initially put in. On medication for it which is reportedly helpful. 4-9/10 range on NPS. She denies any redness, swelling, fevers/chills re: drain  PRECAUTIONS: cardiac hx, LLQ drain   WEIGHT BEARING RESTRICTIONS: No  FALLS:  Has patient fallen in last 6 months? No - pt denies overt falls but does endorse one instance of trying to reach a chair and having a controlled slide to floor  LIVING ENVIRONMENT: Lives in [place], has to navigate 17 stairs 1 rail + bannister. All one level once inside Lives alone - does all of housework independently, since hospitalization has had some assist from daughter Walk in shower w/ seat Has a walker, has been on cane for a couple weeks.   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 There is more conspicuous oral contrast  material within the cavity on today's exam, confirming persistent communication between the gastric lumen and the drained cavity. "   PATIENT SURVEYS:  FOTO 35 current, 55 predicted  COGNITION: Overall cognitive status: Within functional limits for tasks assessed     SENSATION: NT   POSTURE: mildly guarded UE/UT posture in seated  PALPATION: Not indicated  RANGE OF MOTION:     Active  Right eval Left eval  Shoulder flexion    Functional ER combo    Functional IR combo    Knee extension    Ankle dorsiflexion     (Blank rows = not tested) (Key: WFL = within functional limits not formally assessed, * = concordant pain, s =  stiffness/stretching sensation, NT = not tested)  Comments:    STRENGTH TESTING:  MMT Right eval Left eval  Shoulder flexion    Shoulder abduction    Elbow flexion    Elbow extension    Grip strength (gross)    Hip flexion    Hip abduction (modified sitting)    Knee flexion    Knee extension    Ankle dorsiflexion    Ankle plantarflexion     (Blank rows = not tested) (Key: WFL = within functional limits not formally assessed, * = concordant pain, s = stiffness/stretching sensation, NT = not tested)  Comments:    FUNCTIONAL TESTS:  Resting vitals: 87-93bpm, SpO2 98-99% 5xSTS: 20.15sec HR up to 108bpm, SpO2 stable, recovers to baseline quickly TUG: 24.56sec no AD, no significant change in HR or SpO2  GAIT: Distance walked: within clinic Assistive device utilized: Single point cane Level of assistance: Modified independence Comments: partial step through pattern, SPC in RUE, increased time/effort  TODAY'S TREATMENT:                                                                                                                              [facility] Adult PT Treatment:                                                DATE: 08/09/23 Therapeutic Exercise: Standing heel raises 2x10 cues for reduced fwd trunk lean LAQ 2x8 BIL LE (on raised mat for  improved comfort with positioning) cues for increased quad contraction, foot positioning Seated heel/toe raises 2x10 cues for initial form STS from raised mat 2x5 cues for appropriate mechanics/pacing Standing hip extensions at counter x8 BIL cues for mechanics, posture HEP handout + education   [facility] Adult PT Treatment:                                                DATE: 07/31/23 Therapeutic Exercise: Heel raises x10 at counter HEP handout + education w/ education on safe setup at home    PATIENT EDUCATION:  Education details: rationale for interventions, HEP Person educated: Patient Education method: Explanation, Demonstration, Tactile cues, Verbal cues, and Handouts Education comprehension: verbalized understanding, returned demonstration, verbal cues required, tactile cues required, and needs further education    HOME EXERCISE PROGRAM: Access Code: [email or web address] URL: [email or web address] Date: 08/09/2023 Prepared by: Fransisco Hertz  Exercises - Sit to Stand with Armchair  - 2-3 x daily - 7 x weekly - 1 sets - 5 reps - Heel Raises with Counter Support  - 2-3 x daily - 7 x weekly - 1 sets - 10 reps - Seated Long Arc Quad  - 2-3 x daily - 7  x weekly - 1 sets - 10 reps - Seated Heel Toe Raises  - 2-3 x daily - 7 x weekly - 1 sets - 10 reps  ASSESSMENT:  CLINICAL IMPRESSION: 08/09/2023 Pt arrives with report of some increased pain over past week or so, states she spoke with GI provider and she may be getting surgery in next couple weeks. Today continuing to work on LE strength in [employer] positions as able and maintaining active mobility of ankles/knees in seated positions. Pt tolerates well with exertional fatigue that is mitigated by rest breaks. Advised to continue monitoring drain and keep [place] updated as she communicates w/ GI/surgery so we can accommodate appropriately from PT perspective. No adverse events, departs with report of mild muscular fatigue, no increased irritation  with drain. Recommend continuing along current POC in order to address relevant deficits and improve functional tolerance. Pt departs today's session in no acute distress, all voiced questions/concerns addressed appropriately from PT perspective.    Per eval - Patient is a pleasant 52 y.o. woman who was seen today for physical therapy evaluation and treatment for weakness and mobility limitations. She endorses difficulty with basic mobility and household tasks since hospitalization. On exam she demonstrates functional weakness and reduced activity tolerance as evidenced by TUG time, 5xSTS time, and functional kinematics. No adverse events, does well with introductory HEP although she does endorse some muscular fatigue. Recommend trial of skilled PT to address aforementioned deficits with aim of improving functional tolerance and reducing pain with typical activities. Pt departs today's session in no acute distress, all voiced concerns/questions addressed appropriately from PT perspective.      OBJECTIVE IMPAIRMENTS: Abnormal gait, decreased activity tolerance, decreased balance, decreased endurance, decreased mobility, difficulty walking, decreased strength, improper body mechanics, and pain.   ACTIVITY LIMITATIONS: carrying, lifting, bending, standing, squatting, stairs, transfers, bathing, dressing, hygiene/grooming, and locomotion level  PARTICIPATION LIMITATIONS: meal prep, cleaning, laundry, community activity, and occupation  PERSONAL FACTORS: Time since onset of injury/illness/exacerbation and 3+ comorbidities: DM, asthma, gastric perforation  are also affecting patient's functional outcome.   REHAB POTENTIAL: Fair given comorbidities  CLINICAL DECISION MAKING: Evolving/moderate complexity  EVALUATION COMPLEXITY: Moderate   GOALS: Goals reviewed with patient? Yes  SHORT TERM GOALS: Target date: 08/28/2023    Pt will demonstrate appropriate understanding and performance of initially  prescribed HEP in order to facilitate improved independence with management of symptoms.  Baseline: HEP provided on eval Goal status: INITIAL   2. Pt will score greater than or equal to 45 on FOTO in order to demonstrate improved perception of function due to symptoms.  Baseline: 35  Goal status: INITIAL    LONG TERM GOALS: Target date: 09/25/2023   Pt will score 55 on FOTO in order to demonstrate improved perception of functional status due to symptoms.  Baseline: 35 Goal status: INITIAL  2.  Pt will be able to perform TUG in less than or equal to 12 sec with LRAD in order to indicate reduced risk of falling (cutoff score for fall risk 13.5 sec in community dwelling older adults per [facility] et al, 2000)  Baseline: 24sec no AD  Goal status: INITIAL    4. Pt will perform 5xSTS in <12 sec in order to demonstrate reduced fall risk and improved functional independence. (MCID of 2.3sec)  Baseline: 20sec  Goal status: INITIAL   5. Pt will be able to safely navigate up to 17 stairs w/o UE support in order to facilitate improved home access.  Baseline: able to  enter home w/ UE support and significantly increased time/effort, per pt report  Goal status: INITIAL  6. Pt will demonstrate appropriate performance of final prescribed HEP in order to facilitate improved self-management of symptoms post-discharge.   Baseline: initial HEP prescribed  Goal status: INITIAL    PLAN:  PT FREQUENCY: 1-2x/week  PT DURATION: 8 weeks  PLANNED INTERVENTIONS: Therapeutic exercises, Therapeutic activity, Neuromuscular re-education, Balance training, Gait training, Patient/Family education, Self Care, Joint mobilization, Stair training, Cryotherapy, Moist heat, Taping, Manual therapy, and Re-evaluation.  PLAN FOR NEXT SESSION: Review/update HEP PRN. Work on strength exercises as appropriate with emphasis on functional mobility, transfer mechanics, and activity tolerance. Would likely benefit from  monitoring SpO2/HR with heavier activity     Ashley Murrain PT, DPT 08/09/2023 3:29 PM     Discharge addendum 10/19/2023  PHYSICAL THERAPY DISCHARGE SUMMARY  Visits from Start of Care: 2  Current functional level related to goals / functional outcomes: Unable to be assessed   Remaining deficits: Unable to be assessed   Education / Equipment: Unable to be assessed  Patient goals were unable to be assessed. Patient is being discharged due to not returning since the last visit.   Ashley Murrain PT, DPT 10/19/2023 8:57 AM

## 2023-08-09 ENCOUNTER — Encounter: Payer: Self-pay | Admitting: Physical Therapy

## 2023-08-09 ENCOUNTER — Ambulatory Visit: Payer: 59 | Admitting: Physical Therapy

## 2023-08-09 DIAGNOSIS — M6281 Muscle weakness (generalized): Secondary | ICD-10-CM | POA: Diagnosis not present

## 2023-08-09 DIAGNOSIS — R2689 Other abnormalities of gait and mobility: Secondary | ICD-10-CM

## 2023-08-10 ENCOUNTER — Other Ambulatory Visit: Payer: Self-pay | Admitting: *Deleted

## 2023-08-10 ENCOUNTER — Ambulatory Visit: Payer: 59 | Admitting: Internal Medicine

## 2023-08-10 ENCOUNTER — Ambulatory Visit (INDEPENDENT_AMBULATORY_CARE_PROVIDER_SITE_OTHER): Payer: 59 | Admitting: Nurse Practitioner

## 2023-08-10 ENCOUNTER — Other Ambulatory Visit: Payer: 59

## 2023-08-10 ENCOUNTER — Encounter: Payer: Self-pay | Admitting: Nurse Practitioner

## 2023-08-10 VITALS — BP 118/72 | HR 106 | Wt 199.0 lb

## 2023-08-10 DIAGNOSIS — D509 Iron deficiency anemia, unspecified: Secondary | ICD-10-CM

## 2023-08-10 DIAGNOSIS — I5030 Unspecified diastolic (congestive) heart failure: Secondary | ICD-10-CM

## 2023-08-10 DIAGNOSIS — E118 Type 2 diabetes mellitus with unspecified complications: Secondary | ICD-10-CM | POA: Diagnosis not present

## 2023-08-10 DIAGNOSIS — G8928 Other chronic postprocedural pain: Secondary | ICD-10-CM

## 2023-08-10 DIAGNOSIS — K255 Chronic or unspecified gastric ulcer with perforation: Secondary | ICD-10-CM | POA: Diagnosis not present

## 2023-08-11 ENCOUNTER — Inpatient Hospital Stay: Payer: 59 | Admitting: Internal Medicine

## 2023-08-11 ENCOUNTER — Inpatient Hospital Stay: Payer: 59

## 2023-08-11 MED ORDER — GABAPENTIN 600 MG PO TABS
ORAL_TABLET | ORAL | 11 refills | Status: DC
Start: 2023-08-11 — End: 2023-10-31

## 2023-08-11 MED ORDER — NALOXONE HCL 4 MG/0.1ML NA LIQD
NASAL | 2 refills | Status: DC
Start: 2023-08-11 — End: 2023-10-31

## 2023-08-11 MED ORDER — OXYCODONE HCL 5 MG PO TABS
5.0000 mg | ORAL_TABLET | Freq: Four times a day (QID) | ORAL | 0 refills | Status: DC | PRN
Start: 2023-08-11 — End: 2023-10-31

## 2023-08-11 NOTE — Patient Instructions (Addendum)
I have changed the pain medication to the following:  SCHEDULED PAIN MANAGEMENT This will provide a baseline pain control and help you need less opiate pain medication.  - Gabapentin 600mg  7 am and 3 pm (7-8 hours apart, 2 times a day) - Gabapentin 900mg  10-11 pm at bedtime (7-8 hours after the last 600mg  dose) - Tylenol 1000mg  every 12 hours. (Only take 500mg  of tylenol every 12 hours if taking hydrocodone-acetaminophen combination pill)  AS NEEDED PAIN MANAGEMENT This medication should only be taken if breakthrough pain is present after the above medication has been taken.  If your pain is more than 6/10 on the pain scale, consider taking a dose of oxycodone.  - Oxycodone 1 tab at least 6 hours from the last dose.  - If your pain is very severe (9-10/10) you may take an additional 2.5mg  dose, but no more than twice in 24 hours.   To finish off the hydrocodone-acetaminophen remaining, consider alternating with the oxycodone to see if this is helpful. Try the smallest dose possible.   I have also sent in medication that you should keep with you at all times in the event of accidental overdose.  Inform someone with you that you have this medication and teach them how to use it.  Instructions are below:     What is the most important information I should know about NARCAN Nasal Spray?  NARCAN Nasal Spray is used to temporarily reverse the effects of opioid medicines. The medicine in [facility] Nasal Spray has no effect in people who are not taking opioid medicines. Always carry NARCAN Nasal Spray with you in case of an opioid emergency.  1. Use NARCAN Nasal Spray right away if you or your caregiver think signs or symptoms of an opioid emergency are present, even if you are not sure, because an opioid emergency can cause severe injury or death. Signs and symptoms of an opioid emergency may include:  ? unusual sleepiness and you are not able to awaken the person with a loud voice or by rubbing  firmly on the middle of their chest (sternum)  ? breathing problems including slow or shallow breathing in someone difficult to awaken or who looks like they are not breathing  ? the black circle in the center of the colored part of the eye (pupil) is very small, sometimes called "pinpoint pupils," in someone difficult to awaken  2. Family members, caregivers, or other people who may have to use NARCAN Nasal Spray in an opioid emergency should know where [facility] Nasal Spray is stored and how to give [facility] before an opioid emergency happens.  3. Get emergency medical help right away after giving the first dose of NARCAN Nasal Spray. Rescue breathing or CPR (cardiopulmonary resuscitation) may be given while waiting for emergency medical help.  4. The signs and symptoms of an opioid emergency can return after [facility] Nasal Spray is given. If this happens, give another dose after 2 to 3 minutes using a new NARCAN Nasal Spray and watch the person closely until emergency help is received.      Naloxone Nasal Spray What is this medication?  NALOXONE (nal OX one) treats opioid overdose, which causes slow or shallow breathing, severe drowsiness, or trouble staying awake. Call emergency services after using this medication. You may need additional treatment. Naloxone works by reversing the effects of opioids. It belongs to a group of medications called opioid blockers. This medicine may be used for other purposes; ask your health care  provider or pharmacist if you have questions. COMMON BRAND NAME(S): Kloxxado, Narcan, REXTOVY, RiVive  What should I tell my care team before I take this medication?  They need to know if you have any of these conditions: Heart disease Substance use disorder An unusual or allergic reaction to naloxone, other medications, foods, dyes, or preservatives Pregnant or trying to get pregnant Breast-feeding  How should I use this medication?  This medication is for use in the  nose. Lay the person on their back. Support their neck with your hand and allow the head to tilt back before giving the medication. The nasal spray should be given into 1 nostril. After giving the medication, move the person onto their side. Do not remove or test the nasal spray until ready to use. Get emergency medical help right away after giving the first dose of this medication, even if the person wakes up. You should be familiar with how to recognize the signs and symptoms of a narcotic overdose. If more doses are needed, give the additional dose in the other nostril.  Talk to your care team about the use of this medication in children. While this medication may be prescribed for children as young as newborns for selected conditions, precautions do apply. Overdosage: If you think you have taken too much of this medicine contact a poison control center or emergency room at once. NOTE: This medicine is only for you. Do not share this medicine with others.  What if I miss a dose?  This does not apply.  What may interact with this medication?  This is only used during an emergency. No interactions are expected during emergency use. This list may not describe all possible interactions. Give your health care provider a list of all the medicines, herbs, non-prescription drugs, or dietary supplements you use. Also tell them if you smoke, drink alcohol, or use illegal drugs. Some items may interact with your medicine.  What should I watch for while using this medication?  Keep this medication ready for use in the case of an opioid overdose. Make sure that you have the phone number of your care team and local hospital ready. You may need to have additional doses of this medication. Each nasal spray contains a single dose. Some emergencies may require additional doses. After use, bring the treated person to the nearest hospital or call 911. Make sure the treating care team knows that the person has  received a dose of this medication. You will receive additional instructions on what to do during and after use of this medication before an emergency occurs. What side effects may I notice from receiving this medication? Side effects that you should report to your care team as soon as possible: Allergic reactions--skin rash, itching, hives, swelling of the face, lips, tongue, or throat Side effects that usually do not require medical attention (report these to your care team if they continue or are bothersome): Constipation Dryness or irritation inside the nose Headache Increase in blood pressure Muscle spasms Stuffy nose Toothache This list may not describe all possible side effects. Call your doctor for medical advice about side effects. You may report side effects to FDA at 1-800-FDA-1088.  Where should I keep my medication?  Keep out of the reach of children and pets. Store between 20 and 25 degrees C (68 and 77 degrees F). Do not freeze. Throw away any unused medication after the expiration date. Keep in original box until ready to use.   NOTE:  This sheet is a summary. It may not cover all possible information. If you have questions about this medicine, talk to your doctor, pharmacist, or health care provider.  2024 Elsevier/Gold Standard (2021-09-27 00:00:00)

## 2023-08-11 NOTE — Progress Notes (Unsigned)
Frances Clamp, DNP, AGNP-c [facility]  [address] [place], [place] [address] [phone]  ESTABLISHED PATIENT- Chronic Health and/or Follow-Up Visit  Blood pressure 118/72, pulse (!) 106, weight 199 lb (90.3 kg).    Frances Rodgers is a 52 y.o. year old female presenting today for evaluation and management of chronic conditions.   History of Present Illness [name], a 52 year old individual with a history of gastric sleeve surgery, presents with ongoing complications related to a persistent abdominal drain following perforation of gastric sleeve incision a few months ago. The drain, which has been in place for nearly two months, is causing significant discomfort and pain, necessitating frequent changes of dressings due to leakage around the tube. The patient reports that the pain is severe, affecting sleep and daily activities, and is exacerbated by movement, including walking and sitting. The pain is described as "excruciating", particularly when transitioning from a lying to a standing position.   She has been in communication with a new GI specialist at [facility], who has suggested the possibility of another surgery to place the drain bulb internally. This suggestion has caused the patient significant distress, as she is already struggling with the current level of pain and discomfort. The patient is also concerned about the potential for infection, as the current drain is external.  In addition to the drain-related issues, the patient reports that the sutures from the original surgery are causing additional discomfort. The sutures are described as cutting into the patient's stomach, causing pain that is likened to a sawing sensation. This issue has prompted the patient to seek emergency care for assessment and potential intervention as recommended by the GI provider at [facility]. She plans to report to the ED at [facility] as directed following this appointment.   The patient's pain  management regimen currently includes oxycodone with Tylenol (Percocet 5-325 every 6 hours), which she reports is not providing adequate relief. She reports a history of using morphine for pain management in the hospital setting. The patient has expressed a desire for stronger pain medication to manage the severe discomfort. She had no relief with the use of hydrocodone-acetaminophen previously. She reports that when she was provided oxycodone IR 5mg  every 4 hours she was able to have relief. There is concern due to the length of time she has been on chronic pain medication and the risks of dependence that come along with this. We discussed the complexity of ensuring adequate pain management, but avoidance of prolonged treatment and high MME associated with these medications as well as the risks of accidental overdose.   The patient also has a history of high blood sugar, which was initially thought to be diabetes. Her blood sugar levels have been consistently well-controlled with a regimen of Lantus and Humalog insulin, and a diet that is low in carbs, salt, and other dietary restrictions. The patient's blood sugar levels have been consistently under 120, even postprandially.   The patient's ongoing medical issues have also impacted her employment, with a return-to-work date that has been pushed back multiple times due to the need for further medical intervention. The patient has expressed concern about the potential for job loss due to the extended medical leave. We discussed the need for time to heal and appropriate considerations for return to work.  All ROS negative with exception of what is listed above.   PHYSICAL EXAM Physical Exam Vitals and nursing note reviewed.  Constitutional:      General: She is in acute distress.  Appearance: She is not ill-appearing, toxic-appearing or diaphoretic.  Eyes:     Conjunctiva/sclera: Conjunctivae normal.  Cardiovascular:     Rate and Rhythm: Normal  rate and regular rhythm.     Pulses: Normal pulses.     Heart sounds: Normal heart sounds.  Pulmonary:     Effort: Pulmonary effort is normal.     Breath sounds: Normal breath sounds.  Abdominal:     Tenderness: There is abdominal tenderness. There is guarding.    Neurological:     Mental Status: She is alert.     PLAN Problem List Items Addressed This Visit     Gastric perforation (HCC)    Persistent issues with gastric perforation over 2 months ago with JP drain in place. The primary complications include difficult to control pain and drainage from the JP site. She is followed with GI specialist at [facility], Dr. Michail Sermon, who suggested internal drain placement and has recommended she seek care at the [facility] ED with her partners. Patient is scheduled for potential surgery within the next two weeks. The concern with pain management has been left to primacy care to determine the most appropriate measures, which has been a challenge. She has not responded well to Percocet every 6 hours. There is a delicate balance of pain management vs risk of overmedicating and risks of dependence with prolonged use of this type of medication. We discussed this at length today.  Extension of FMLA will be provided through the end of the year. Given her level of pain and future surgeries, I do not feel that she can safely return to work at this time. We can assess need for social work intervention to aid in navigating resources available given the length of time out of work.  -Continue current wound care and monitor for signs of infection. -Report to [facility] as directed to meet with GI team.  - Increase oxycodone-acetaminophen dose to 10-325 every 8 hours for now.  - Will change treatment to include oxycodoneIR 5mg  with closer dosing and option for additional PRN dose for breakthrough pain.  - Will also increase gabapentin for baseline pain control.  - Future pain management will likely need evaluation by pain  management specialist given the length of time management has been required and limitations in the primary care setting.       Relevant Medications   gabapentin (NEURONTIN) 600 MG tablet   oxyCODONE (ROXICODONE) 5 MG immediate release tablet   DM (diabetes mellitus), type 2 with complications (HCC) - Primary    Blood sugars well controlled with current regimen of Lantus 11 units daily and Humalog 3 units TID with meals. Patient is concerned that she does not have diabetes given the control of her blood sugars. The use of insulin and blood sugar control is consistent with diabetes diagnosis and confirms that this is present. We can consider referral to endocrinology for further management and recommendations once more stable.  -Continue current insulin regimen. -Monitor blood sugars at home, aiming for fasting levels between 80-120 and postprandial levels less than 150-180.      Diastolic heart failure (HCC)   Other Visit Diagnoses     Chronic pain following surgery or procedure       Relevant Medications   gabapentin (NEURONTIN) 600 MG tablet   oxyCODONE (ROXICODONE) 5 MG immediate release tablet   naloxone (NARCAN) nasal spray 4 mg/0.1 mL       Return in about 2 months (around 10/10/2023) for Med Management 45.  Frances Clamp, DNP, AGNP-c

## 2023-08-14 ENCOUNTER — Ambulatory Visit: Payer: 59 | Admitting: Physical Therapy

## 2023-08-14 ENCOUNTER — Encounter: Payer: Self-pay | Admitting: Nurse Practitioner

## 2023-08-14 NOTE — Assessment & Plan Note (Addendum)
Persistent issues with gastric perforation over 2 months ago with JP drain in place. The primary complications include difficult to control pain and drainage from the JP site. She is followed with GI specialist at [facility], Dr. Michail Sermon, who suggested internal drain placement and has recommended she seek care at the [facility] ED with her partners. Patient is scheduled for potential surgery within the next two weeks. The concern with pain management has been left to primacy care to determine the most appropriate measures, which has been a challenge. She has not responded well to Percocet every 6 hours. There is a delicate balance of pain management vs risk of overmedicating and risks of dependence with prolonged use of this type of medication. We discussed this at length today.  Extension of FMLA will be provided through the end of the year. Given her level of pain and future surgeries, I do not feel that she can safely return to work at this time. We can assess need for social work intervention to aid in navigating resources available given the length of time out of work.  -Continue current wound care and monitor for signs of infection. -Report to [facility] as directed to meet with GI team.  - Increase oxycodone-acetaminophen dose to 10-325 every 8 hours for now.  - Will change treatment to include oxycodoneIR 5mg  with closer dosing and option for additional PRN dose for breakthrough pain.  - Will also increase gabapentin for baseline pain control.  - Future pain management will likely need evaluation by pain management specialist given the length of time management has been required and limitations in the primary care setting.

## 2023-08-14 NOTE — Assessment & Plan Note (Signed)
Blood sugars well controlled with current regimen of Lantus 11 units daily and Humalog 3 units TID with meals. Patient is concerned that she does not have diabetes given the control of her blood sugars. The use of insulin and blood sugar control is consistent with diabetes diagnosis and confirms that this is present. We can consider referral to endocrinology for further management and recommendations once more stable.  -Continue current insulin regimen. -Monitor blood sugars at home, aiming for fasting levels between 80-120 and postprandial levels less than 150-180.

## 2023-08-15 ENCOUNTER — Inpatient Hospital Stay: Payer: 59 | Admitting: Internal Medicine

## 2023-08-15 ENCOUNTER — Encounter: Payer: Self-pay | Admitting: Internal Medicine

## 2023-08-15 ENCOUNTER — Inpatient Hospital Stay: Payer: 59

## 2023-08-18 ENCOUNTER — Ambulatory Visit: Payer: 59 | Admitting: Physical Therapy

## 2023-08-22 ENCOUNTER — Encounter: Payer: Self-pay | Admitting: Nurse Practitioner

## 2023-08-22 ENCOUNTER — Ambulatory Visit: Payer: 59

## 2023-08-24 ENCOUNTER — Ambulatory Visit: Payer: 59

## 2023-08-24 DIAGNOSIS — R110 Nausea: Secondary | ICD-10-CM

## 2023-08-25 ENCOUNTER — Ambulatory Visit: Payer: 59 | Admitting: Infectious Diseases

## 2023-08-25 MED ORDER — ONDANSETRON 4 MG PO TBDP
4.0000 mg | ORAL_TABLET | Freq: Three times a day (TID) | ORAL | 0 refills | Status: DC | PRN
Start: 2023-08-25 — End: 2023-10-31

## 2023-08-30 ENCOUNTER — Other Ambulatory Visit: Payer: Self-pay | Admitting: Nurse Practitioner

## 2023-08-31 ENCOUNTER — Ambulatory Visit: Payer: 59 | Admitting: Infectious Diseases

## 2023-09-08 ENCOUNTER — Other Ambulatory Visit: Payer: Self-pay

## 2023-09-08 ENCOUNTER — Emergency Department
Admission: EM | Admit: 2023-09-08 | Discharge: 2023-09-08 | Disposition: A | Discharge status: Home / Self Care | Payer: 59 | Attending: Emergency Medicine | Admitting: Emergency Medicine

## 2023-09-08 ENCOUNTER — Emergency Department: Payer: 59

## 2023-09-08 ENCOUNTER — Encounter (HOSPITAL_COMMUNITY): Payer: Self-pay

## 2023-09-08 ENCOUNTER — Ambulatory Visit: Payer: 59 | Admitting: Cardiology

## 2023-09-08 DIAGNOSIS — J869 Pyothorax without fistula: Secondary | ICD-10-CM

## 2023-09-08 DIAGNOSIS — R0602 Shortness of breath: Secondary | ICD-10-CM | POA: Diagnosis present

## 2023-09-08 DIAGNOSIS — I5030 Unspecified diastolic (congestive) heart failure: Secondary | ICD-10-CM | POA: Insufficient documentation

## 2023-09-08 DIAGNOSIS — K859 Acute pancreatitis without necrosis or infection, unspecified: Secondary | ICD-10-CM

## 2023-09-08 DIAGNOSIS — J45909 Unspecified asthma, uncomplicated: Secondary | ICD-10-CM | POA: Insufficient documentation

## 2023-09-08 DIAGNOSIS — E119 Type 2 diabetes mellitus without complications: Secondary | ICD-10-CM | POA: Insufficient documentation

## 2023-09-08 DIAGNOSIS — E872 Acidosis, unspecified: Secondary | ICD-10-CM

## 2023-09-08 DIAGNOSIS — A419 Sepsis, unspecified organism: Secondary | ICD-10-CM

## 2023-09-08 DIAGNOSIS — K651 Peritoneal abscess: Secondary | ICD-10-CM

## 2023-09-08 DIAGNOSIS — K6811 Postprocedural retroperitoneal abscess: Secondary | ICD-10-CM | POA: Insufficient documentation

## 2023-09-08 LAB — HEPATIC FUNCTION PANEL
ALT: 101 U/L — ABNORMAL HIGH (ref 0–44)
AST: 135 U/L — ABNORMAL HIGH (ref 15–41)
Albumin: 2.1 g/dL — ABNORMAL LOW (ref 3.5–5.0)
Alkaline Phosphatase: 205 U/L — ABNORMAL HIGH (ref 38–126)
Bilirubin, Direct: 2 mg/dL — ABNORMAL HIGH (ref 0.0–0.2)
Indirect Bilirubin: 1.7 mg/dL — ABNORMAL HIGH (ref 0.3–0.9)
Total Bilirubin: 3.7 mg/dL — ABNORMAL HIGH (ref 0.3–1.2)
Total Protein: 6.2 g/dL — ABNORMAL LOW (ref 6.5–8.1)

## 2023-09-08 LAB — BASIC METABOLIC PANEL
Anion gap: 11 (ref 5–15)
BUN: 10 mg/dL (ref 6–20)
CO2: 21 mmol/L — ABNORMAL LOW (ref 22–32)
Calcium: 8.2 mg/dL — ABNORMAL LOW (ref 8.9–10.3)
Chloride: 104 mmol/L (ref 98–111)
Creatinine, Ser: 0.83 mg/dL (ref 0.44–1.00)
GFR, Estimated: >60 mL/min (ref >60)
Glucose, Bld: 169 mg/dL — ABNORMAL HIGH (ref 70–99)
Potassium: 3.9 mmol/L (ref 3.5–5.1)
Sodium: 136 mmol/L (ref 135–145)

## 2023-09-08 LAB — CBC WITH DIFFERENTIAL/PLATELET
Abs Immature Granulocytes: 0.35 10*3/uL — ABNORMAL HIGH (ref 0.00–0.07)
Basophils Absolute: 0 10*3/uL (ref 0.0–0.1)
Basophils Relative: 0 %
Eosinophils Absolute: 0 10*3/uL (ref 0.0–0.5)
Eosinophils Relative: 0 %
HCT: 30.9 % — ABNORMAL LOW (ref 36.0–46.0)
Hemoglobin: 9.3 g/dL — ABNORMAL LOW (ref 12.0–15.0)
Immature Granulocytes: 2 %
Lymphocytes Relative: 17 %
Lymphs Abs: 3 10*3/uL (ref 0.7–4.0)
MCH: 29.2 pg (ref 26.0–34.0)
MCHC: 30.1 g/dL (ref 30.0–36.0)
MCV: 97.2 fL (ref 80.0–100.0)
Monocytes Absolute: 0.7 10*3/uL (ref 0.1–1.0)
Monocytes Relative: 4 %
Neutro Abs: 13.5 10*3/uL — ABNORMAL HIGH (ref 1.7–7.7)
Neutrophils Relative %: 77 %
Platelets: 460 10*3/uL — ABNORMAL HIGH (ref 150–400)
RBC: 3.18 MIL/uL — ABNORMAL LOW (ref 3.87–5.11)
RDW: 29.9 % — ABNORMAL HIGH (ref 11.5–15.5)
Smear Review: NORMAL
WBC: 17.7 10*3/uL — ABNORMAL HIGH (ref 4.0–10.5)
nRBC: 0.2 % (ref 0.0–0.2)

## 2023-09-08 LAB — BLOOD GAS, VENOUS
Acid-base deficit: 2.9 mmol/L — ABNORMAL HIGH (ref 0.0–2.0)
Bicarbonate: 22.6 mmol/L (ref 20.0–28.0)
O2 Saturation: 51.4 %
Patient temperature: 37
pCO2, Ven: 41 mm[Hg] — ABNORMAL LOW (ref 44–60)
pH, Ven: 7.35 (ref 7.25–7.43)
pO2, Ven: 34 mm[Hg] (ref 32–45)

## 2023-09-08 LAB — LACTIC ACID, PLASMA
Lactic Acid, Venous: 8.4 mmol/L (ref 0.5–1.9)
Lactic Acid, Venous: >9 mmol/L (ref 0.5–1.9)

## 2023-09-08 LAB — LIPASE, BLOOD: Lipase: 45 U/L (ref 11–51)

## 2023-09-08 LAB — BRAIN NATRIURETIC PEPTIDE: B Natriuretic Peptide: 120.2 pg/mL — ABNORMAL HIGH (ref 0.0–100.0)

## 2023-09-08 LAB — TROPONIN I (HIGH SENSITIVITY): Troponin I (High Sensitivity): 12 ng/L (ref <18)

## 2023-09-08 LAB — D-DIMER, QUANTITATIVE: D-Dimer, Quant: 2.82 ug{FEU}/mL — ABNORMAL HIGH (ref 0.00–0.50)

## 2023-09-08 SURGERY — LAPAROTOMY, EXPLORATORY
Anesthesia: General

## 2023-09-08 MED ORDER — PIPERACILLIN-TAZOBACTAM 3.375 G IVPB 30 MIN
3.3750 g | Freq: Once | INTRAVENOUS | Status: DC
Start: 2023-09-08 — End: 2023-09-08

## 2023-09-08 MED ORDER — IOHEXOL 350 MG/ML SOLN
100.0000 mL | Freq: Once | INTRAVENOUS | Status: AC | PRN
Start: 2023-09-08 — End: 2023-09-08
  Administered 2023-09-08: 100 mL via INTRAVENOUS

## 2023-09-08 MED ORDER — LACTATED RINGERS IV BOLUS
1000.0000 mL | Freq: Once | INTRAVENOUS | Status: AC
  Administered 2023-09-08: 1000 mL via INTRAVENOUS

## 2023-09-08 MED ORDER — FENTANYL CITRATE PF 50 MCG/ML IJ SOSY
50.0000 ug | PREFILLED_SYRINGE | Freq: Once | INTRAMUSCULAR | Status: AC
  Administered 2023-09-08: 50 ug via INTRAVENOUS
  Filled 2023-09-08: qty 1

## 2023-09-08 MED ORDER — MORPHINE SULFATE (PF) 4 MG/ML IV SOLN
4.0000 mg | Freq: Once | INTRAVENOUS | Status: AC
  Administered 2023-09-08: 4 mg via INTRAVENOUS
  Filled 2023-09-08: qty 1

## 2023-09-08 MED ORDER — VANCOMYCIN HCL IN DEXTROSE 1-5 GM/200ML-% IV SOLN
1000.0000 mg | Freq: Once | INTRAVENOUS | Status: AC
  Administered 2023-09-08: 1000 mg via INTRAVENOUS
  Filled 2023-09-08: qty 200

## 2023-09-08 MED ORDER — LACTATED RINGERS IV BOLUS (SEPSIS)
1000.0000 mL | Freq: Once | INTRAVENOUS | Status: AC
  Administered 2023-09-08: 1000 mL via INTRAVENOUS

## 2023-09-08 MED ORDER — SODIUM CHLORIDE 0.9 % IV SOLN
2.0000 g | Freq: Once | INTRAVENOUS | Status: AC
  Administered 2023-09-08: 2 g via INTRAVENOUS
  Filled 2023-09-08 (×2): qty 12.5

## 2023-09-08 MED ORDER — METRONIDAZOLE 500 MG/100ML IV SOLN
500.0000 mg | Freq: Once | INTRAVENOUS | Status: AC
  Administered 2023-09-08: 500 mg via INTRAVENOUS
  Filled 2023-09-08: qty 100

## 2023-09-08 MED ORDER — MIDAZOLAM HCL 2 MG/2ML IJ SOLN
2.0000 mg | Freq: Once | INTRAMUSCULAR | Status: AC
  Administered 2023-09-08: 2 mg via INTRAVENOUS
  Filled 2023-09-08: qty 2

## 2023-09-08 MED ORDER — DILTIAZEM HCL 25 MG/5ML IV SOLN
15.0000 mg | Freq: Once | INTRAVENOUS | Status: AC
  Administered 2023-09-08: 15 mg via INTRAVENOUS
  Filled 2023-09-08: qty 5

## 2023-09-08 NOTE — ED Notes (Signed)
Called [facility] @ [phone] spoke to Burna Mortimer to request transfer to [employer] have been power shared/ Facesheet faxed waiting on call back after reviewed by Admin team

## 2023-09-08 NOTE — ED Provider Notes (Signed)
[facility] Provider Note    Event Date/Time   First MD Initiated Contact with Patient 09/08/23 0018     (approximate)   History   Allergic Reaction   HPI Frances Rodgers is a 52 y.o. female with history of DM2, diastolic heart failure, asthma presenting today for shortness of breath.  Patient states 2 hours ago she had acute onset shortness of breath.  Felt like she had difficult time getting an air and was taking her albuterol inhaler without relief.  Then took 2 EpiPen doses with no improvement in symptoms.  EMS was called.  Notes left-sided chest pain without radiation.  Denies nausea, vomiting, diaphoresis, fever, chills, cough, congestion.  Notes some left upper quadrant abdominal pain and reports recently having a drain removed from that spot but cannot state how long or why the drain was removed.  Has noted some leg swelling but no leg pain.  Chart review: Patient recently seen at [facility] 1 week ago where she had percutaneous drain removed from her left upper quadrant at site of previous abscess collection.     Physical Exam   Triage Vital Signs: ED Triage Vitals  Encounter Vitals Group     BP      Systolic BP Percentile      Diastolic BP Percentile      Pulse      Resp      Temp      Temp src      SpO2      Weight      Height      Head Circumference      Peak Flow      Pain Score      Pain Loc      Pain Education      Exclude from Growth Chart     Most recent vital signs: Vitals:   09/08/23 0600 09/08/23 0630  BP: 105/65 (!) 111/51  Pulse: (!) 142 (!) 136  Resp: (!) 49 (!) 41  Temp:    SpO2: 99% 97%    Physical Exam: I have reviewed the vital signs and nursing notes. General: Awake, alert, anxious appearing and tachypneic. Head:  Atraumatic, normocephalic.   ENT:  EOM intact, PERRL. Oral mucosa is pink and moist with no lesions. Neck: Neck is supple with full range of motion, No meningeal signs. Cardiovascular: Tachycardic,  RR, No murmurs. Peripheral pulses palpable and equal bilaterally. Respiratory:  Symmetrical chest wall expansion.  No rhonchi, rales, or wheezes.  Good air movement throughout.  No use of accessory muscles.  Tachypnea. Musculoskeletal:  No cyanosis.  1+ pitting edema to bilateral lower extremities.  Moving extremities with full ROM Abdomen:  Soft, nontender, nondistended. Neuro:  GCS 15, moving all four extremities, interacting appropriately. Speech clear. Psych:  Calm, appropriate.   Skin:  Warm, dry, no rash.    ED Results / Procedures / Treatments   Labs (all labs ordered are listed, but only abnormal results are displayed) Labs Reviewed  D-DIMER, QUANTITATIVE - Abnormal; Notable for the following components:      Result Value   D-Dimer, Quant 2.82 (*)    All other components within normal limits  CBC WITH DIFFERENTIAL/PLATELET - Abnormal; Notable for the following components:   WBC 17.7 (*)    RBC 3.18 (*)    Hemoglobin 9.3 (*)    HCT 30.9 (*)    RDW 29.9 (*)    Platelets 460 (*)    Neutro Abs 13.5 (*)  Abs Immature Granulocytes 0.35 (*)    All other components within normal limits  BASIC METABOLIC PANEL - Abnormal; Notable for the following components:   CO2 21 (*)    Glucose, Bld 169 (*)    Calcium 8.2 (*)    All other components within normal limits  BRAIN NATRIURETIC PEPTIDE - Abnormal; Notable for the following components:   B Natriuretic Peptide 120.2 (*)    All other components within normal limits  BLOOD GAS, VENOUS - Abnormal; Notable for the following components:   pCO2, Ven 41 (*)    Acid-base deficit 2.9 (*)    All other components within normal limits  HEPATIC FUNCTION PANEL - Abnormal; Notable for the following components:   Total Protein 6.2 (*)    Albumin 2.1 (*)    AST 135 (*)    ALT 101 (*)    Alkaline Phosphatase 205 (*)    Total Bilirubin 3.7 (*)    Bilirubin, Direct 2.0 (*)    Indirect Bilirubin 1.7 (*)    All other components within normal  limits  LACTIC ACID, PLASMA - Abnormal; Notable for the following components:   Lactic Acid, Venous 8.4 (*)    All other components within normal limits  LACTIC ACID, PLASMA - Abnormal; Notable for the following components:   Lactic Acid, Venous >9.0 (*)    All other components within normal limits  CULTURE, BLOOD (SINGLE)  CULTURE, BLOOD (SINGLE)  LIPASE, BLOOD  LACTIC ACID, PLASMA  LACTIC ACID, PLASMA  TROPONIN I (HIGH SENSITIVITY)     EKG My EKG interpretation: Rate of 147, sinus tachycardia, normal axis, normal intervals.  No acute ST elevations or depressions   RADIOLOGY Independently interpreted CT imaging and agree with radiologist.  Spoke with him directly regarding findings.   PROCEDURES:  Critical Care performed: Yes, see critical care procedure note(s)  .Critical Care  Performed by: Janith Lima, MD Authorized by: Janith Lima, MD   Critical care provider statement:    Critical care time (minutes):  80   Critical care was necessary to treat or prevent imminent or life-threatening deterioration of the following conditions:  Sepsis   Critical care was time spent personally by me on the following activities:  Development of treatment plan with patient or surrogate, discussions with consultants, evaluation of patient's response to treatment, examination of patient, ordering and review of laboratory studies, ordering and review of radiographic studies, ordering and performing treatments and interventions, pulse oximetry, re-evaluation of patient's condition, review of old charts and obtaining history from patient or surrogate   Care discussed with: admitting provider      MEDICATIONS ORDERED IN ED: Medications  midazolam (VERSED) injection 2 mg (2 mg Intravenous Given 09/08/23 0114)  iohexol (OMNIPAQUE) 350 MG/ML injection 100 mL (100 mLs Intravenous Contrast Given 09/08/23 0132)  diltiazem (CARDIZEM) injection 15 mg (15 mg Intravenous Given 09/08/23 0157)   morphine (PF) 4 MG/ML injection 4 mg (4 mg Intravenous Given 09/08/23 0247)  lactated ringers bolus 1,000 mL (0 mLs Intravenous Stopped 09/08/23 0448)    And  lactated ringers bolus 1,000 mL (0 mLs Intravenous Stopped 09/08/23 0353)    And  lactated ringers bolus 1,000 mL (0 mLs Intravenous Stopped 09/08/23 0448)  ceFEPIme (MAXIPIME) 2 g in sodium chloride 0.9 % 100 mL IVPB (0 g Intravenous Stopped 09/08/23 0521)  metroNIDAZOLE (FLAGYL) IVPB 500 mg (0 mg Intravenous Stopped 09/08/23 0408)  vancomycin (VANCOCIN) IVPB 1000 mg/200 mL premix (0 mg Intravenous Stopped 09/08/23 0409)  vancomycin (VANCOCIN) IVPB 1000 mg/200 mL premix (0 mg Intravenous Stopped 09/08/23 0551)  fentaNYL (SUBLIMAZE) injection 50 mcg (50 mcg Intravenous Given 09/08/23 0458)  lactated ringers bolus 1,000 mL (1,000 mLs Intravenous New Bag/Given 09/08/23 0618)     IMPRESSION / MDM / ASSESSMENT AND PLAN / ED COURSE  I reviewed the triage vital signs and the nursing notes.                              Differential diagnosis includes, but is not limited to, pneumonia, PE, intra-abdominal infection, ACS, COPD exacerbation, pancreatitis  Patient's presentation is most consistent with acute presentation with potential threat to life or bodily function.  Patient is a 52 year old female initially presenting over concerns at home with possible allergic reaction.  On arrival to the ED, she had already used her inhaler 10+ times within the last couple of hours for shortness of breath and given herself 2 EpiPen injections.  She was tachycardic into the 130s on arrival and tachypneic but no hypoxia on room air.  Blood pressure within normal limits as well.  No evidence of hives or erythema and no wheezing heard on exam.  Did not think she needed additional albuterol treatments or EpiPen for concern of allergic reaction.  Initial concern was her heart rate and tachypnea but otherwise stable vital signs was possible now secondary to  EpiPen and albuterol in the setting of her only noting shortness of breath initially.  Chest x-ray was largely unremarkable and D-dimer was elevated so next plan was for CTA to rule out possible PE.  I was also worried with patient's consistent rhythm at 143 with no variation that she could potentially have been in atrial flutter as well which prompted a one-time diltiazem bolus with no improvement.  No hypotension at this time.  While getting patient ready to transport to CT she finally noted that she was having some left upper quadrant abdominal pain.  Mild tenderness at that site.  Patient stated that she had a drain removed from there but could not tell me when and if it was in the last couple of days, weeks, or months.  Chart review showed that she had percutaneous drain removed from the left upper quadrant where she previously had an abscess.  CT abdomen/pelvis was added at this time.  Patient was found to have a leukocytosis at 17.7 and mildly elevated LFTs although not to do similar from recent values.  Radiologist called me regarding CT results.  Notable for large abscess in the left upper quadrant abutting and irritating the diaphragm.  There was also concern for left-sided pleural effusion or possible empyema.  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  Chart review showed patient did have recent CT 1 week ago noting concern for pancreatitis as well as these areas of vein thrombosis.  She is on Eliquis with no missed doses.  General surgery was urgently consulted.  Sepsis alert was also initiated at this time and patient was given 30 cc/kg fluid, broad-spectrum antibiotics with vancomycin, cefepime, and Flagyl and blood cultures and lactic acid were drawn.  Spoke with Dr. Tonna Boehringer who reviewed the images and came and assessed patient at the bedside.  Sepsis given her extensive history with this abscess and the location along with a possible empyema,  he did not think that we had the capabilities of adequately treating this patient here and requested  transfer to outside hospital.  Patient was found to have a lactic acidosis of 8.4.  Persistent tachycardia even with fluid hydration.  Reach out to [facility] given that they are the ones managing her drain.  They initially did not have capacity to take her but were still working on potentially excepting her once they did have capacity.  [facility] did not have the appropriate level of care for her either at this time and felt her better at [facility].  Repeat lactic elevated at greater than 9.  IR team was consulted for potential assistance with drain placement for source control while awaiting transfer.  The general surgeon also came back down to the ED and reevaluated patient with worsening lactic acidosis and vital signs for discussion of taking her to the OR emergently.  Received a call from [facility] ICU who now was able to accept patient in transfer and trip to the OR was canceled at this time.  Patient was given additional 1 L of fluids.  Patient signed out to oncoming provider while awaiting for arrival of [facility] for transfer to [facility].  The patient is on the cardiac monitor to evaluate for evidence of arrhythmia and/or significant heart rate changes. Clinical Course as of 09/08/23 0743  Fri Sep 08, 2023  0053 WBC(!): 17.7 [DW]  0053 DG Chest Portable 1 View Diaphragms appear elevated but no acute pulmonary edema or obvious pneumonia [DW]  0108 D-Dimer, Quant(!): 2.82 [DW]  0138 Hepatic function panel(!) History of elevated LFTs in the past although this does appear higher today [DW]  0213 Radiologist - CTA: No PE. Looks like catheter is still in the abdomen. Unsure if it fractured coming out. Abscess still in the abdomen and abutting the diaphragm. Catheter if either abutting or entering the stomach. Small left sided pleural effusion and ?empyema. Acute pancreatitis which is new. 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 [DW]  8657 Lactic Acid, Venous(!!): 8.4 [DW]  0430 Call [facility] who stated that they are at capacity at this time.  Eventually was able to speak with one of the surgeons there who stated they would accept the transfer once they have a bed but they do not currently have one available.  He recommended I speak with interventional radiology to see if they would at least be able to place a drain for source control [DW]  0537 Consulted IR team.  They will take a look at the imaging.  Earliest they could place a drain would be at 8 AM this morning [DW]  0538 Sent a message to our general surgery team asking for any additional assistance they could provide.  Lactic was found to be uptrending at 9.0.  Will give additional fluids at this time.  Plan to start on pressors. [DW]  8469 Spoke with [facility] ICU team regarding patient's case.  They stated they will accept her for transfer.  Do not currently have a timetable of when she will be transferred.  They also recommend an additional 1 to 2 L of fluid at this time given uptrending lactic. [DW]  [phone] Our general surgery team discussed with patient taking to the OR at this time for temporary source control before we can get her transferred to [facility].  Current plan is to go to the  OR as soon as possible. [DW]  [phone] [facility] ICU has a bed and has accepted patient.  Working on transfer at this time and will forego going to the OR at this time with impending transfer [DW]  0712 Gastric bypass complicated by RUQ abscess w/ perc drain with [facility]. Pulled 7 days ago. Stent from abscess to stomach to help drain. Dyspnea and concern for allergic reaction - epi and albuterol given by the patient prior to arrival.  CTA negative for PE with empyema,  pancreatitis w/pseudocyst, abscess.  [SM]    Clinical Course User Index [DW] Janith Lima, MD [SM] Corena Herter, MD     FINAL CLINICAL IMPRESSION(S) / ED DIAGNOSES   Final diagnoses:  Postprocedural intraabdominal abscess (HCC)  Sepsis, due to unspecified organism, unspecified whether acute organ dysfunction present (HCC)  Lactic acidosis  Acute pancreatitis, unspecified complication status, unspecified pancreatitis type  Empyema lung (HCC)     Rx / DC Orders   ED Discharge Orders     None        Note:  This document was prepared using Dragon voice recognition software and may include unintentional dictation errors.   Janith Lima, MD 09/08/23 [phone]

## 2023-09-08 NOTE — Sepsis Progress Note (Signed)
Elink monitoring for the code sepsis protocol.  

## 2023-09-08 NOTE — Progress Notes (Signed)
Notified by ED of worsening lactic acid level >9, more episodes of hypotension with MAP in low 70s.  Reassessed patient and she reports worsening pain now.  Abdominal exam with no acute worsening, with still local TTP around epigastric and LUQ.  With worsening lactic acid level and more frequent hypotension despite 3L fluids and IV abx, I discussed with patient and daughter that we may need to proceed with emergent surgery here for further stabilization prior to transfer for a more definitive repair if needed.  I have fully disclosed that with her complicated case, we may not be able to fully address the problem at the time of initial surgery.  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  Patient and daughter questions were answered including possible prognosis, need for intubation postop, and alternative options such as IR guided drainage for minimally invasive approach.  I indicated with her current situation IR guided drainage will be difficult and the team also will not be available until later in the day.  We could potentially try a diagnostic laparoscopy first, but with her hemodynamic instability, starting by trying to obtain pneumoperitoneum with the chance of not being able to have adequate visualization makes me concerned enough to where I initially offered proceeding straight to exploratory laparotomy.  Patient and daughter requested that they have a few minutes to process the information.  I left the room and then revisited several minutes later, which at that time there were trying to contact the on-call surgeon over at [facility] for a second opinion.  They do acknowledge that we have already requested a transfer and [facility] has accepted their case and is currently awaiting for a bed opening.  I did ask if there was any additional questions or concerns I could address at this  time, and they stated they would like to wait for the [facility] on-call surgeon to return their call prior to proceeding with making any decisions.  I ended our conversation by stating that I have called the operating room to be on standby in case they decide to proceed with emergent surgery here.

## 2023-09-08 NOTE — Consult Note (Addendum)
Subjective:   CC: Intra-abdominal abscess, pancreatitis, left lung empyema  HPI:  Frances Rodgers is a 52 y.o. female who was consulted by Anner Crete for issue above.    Complicated medical history of previous sleeve gastrectomy and 2015 at [facility] per chart review.  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  She underwent percutaneous drainage as well as a endoscopic stent placement into the abscess cavity through the stomach.  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  Patient reports sudden onset developing pain shortness of breath earlier in the day and presented to ED.  Noted to be tachycardic with elevated lactic acid.  CT scan as below.  Patient states pain has slightly improved since admission, still feels short of breath.   Past Medical History:  has a past medical history of Abnormal urinalysis (05/28/2023), Acute metabolic encephalopathy (05/28/2023), Acute urinary retention (05/28/2023), AKI (acute kidney injury) (HCC) (05/27/2023), Allergy, AMS (altered mental status) (06/07/2023), Anemia, Asthma, Blood transfusion without reported diagnosis (2023), DKA (diabetic ketoacidosis) (HCC) (05/27/2023), Elevated blood pressure reading (05/28/2023), GERD (gastroesophageal reflux disease), Hypernatremia (05/28/2023), Nausea & vomiting (06/08/2023), Positive D dimer (05/30/2023), Seasonal allergies, Sepsis (HCC) (05/29/2023), Shoulder pain, left (03/09/2023), and Transaminitis (06/12/2023).  Past Surgical History:  Past Surgical History:  Procedure Laterality Date   BIOPSY  06/21/2023   Procedure: BIOPSY;  Surgeon: Meridee Score Netty Starring., MD;  Location: [facility] ENDOSCOPY;  Service: Gastroenterology;;   ESOPHAGOGASTRODUODENOSCOPY (EGD) WITH PROPOFOL N/A 06/21/2023   Procedure:  ESOPHAGOGASTRODUODENOSCOPY (EGD) WITH PROPOFOL;  Surgeon: Lemar Lofty., MD;  Location: [facility] ENDOSCOPY;  Service: Gastroenterology;  Laterality: N/A;   GASTRIC BYPASS  2015   IR PATIENT EVAL TECH 0-60 MINS  06/26/2023   IR RADIOLOGIST EVAL & MGMT  07/06/2023   IR RADIOLOGIST EVAL & MGMT  07/18/2023   IR RADIOLOGIST EVAL & MGMT  07/28/2023   IR RADIOLOGIST EVAL & MGMT  08/03/2023   NASAL POLYP SURGERY  2010   WISDOM TOOTH EXTRACTION      Family History: family history includes Diabetes in her brother and mother; Pancreatic cancer in her maternal grandmother; Stroke in her brother and mother.  Social History:  reports that she has never smoked. She has never used smokeless tobacco. She reports that she does not drink alcohol and does not use drugs.  Current Medications:  Prior to Admission medications   Medication Sig Start Date End Date Taking? Authorizing Provider  albuterol (VENTOLIN HFA) 108 (90 Base) MCG/ACT inhaler Inhale 2 puffs into the lungs every 6 (six) hours as needed for wheezing or shortness of breath.    [provider]  Blood Glucose Monitoring Suppl DEVI 1 each by Does not apply route 3 (three) times daily. May dispense any manufacturer covered by patient's insurance. 06/01/23   Darlin Priestly, MD  Cetirizine HCl 10 MG CAPS Take 10 mg by mouth daily as needed (allergy). 06/02/03   [provider]  Continuous Glucose Sensor (FREESTYLE LIBRE 3 SENSOR) MISC Place 1 sensor on the skin every 14 days. Use to check glucose continuously. 07/05/23   Tollie Eth, NP  furosemide (LASIX) 40 MG tablet Take 1 tablet (40 mg total) by mouth 2 (two) times daily. For your leg swelling. 07/21/23 07/20/24  Debbe Odea, MD  gabapentin (NEURONTIN) 600 MG tablet Take 1  tablet (600 mg total) by mouth 2 (two) times daily AND 1.5 tablets (900 mg total) at bedtime. Take scheduled to reduce baseline pain.. 08/11/23   Tollie Eth, NP  Glucose Blood (BLOOD GLUCOSE TEST STRIPS) STRP 1 each  by Does not apply route 3 (three) times daily. Use as directed to check blood sugar. May dispense any manufacturer covered by patient's insurance and fits patient's device. 07/12/23   Tollie Eth, NP  insulin glargine (LANTUS) 100 UNIT/ML Solostar Pen Inject 11 Units into the skin at bedtime. May substitute as needed per insurance. 07/12/23 10/10/23  Tollie Eth, NP  insulin lispro (HUMALOG) 100 UNIT/ML KwikPen Inject 3 Units into the skin 3 (three) times daily with meals. Only take if eating a meal AND Blood Glucose (BG) is 80 or higher. 07/12/23 10/10/23  Tollie Eth, NP  Insulin Pen Needle (PEN NEEDLES) 31G X 5 MM MISC 1 each by Does not apply route 3 (three) times daily. May dispense any manufacturer covered by patient's insurance. 07/12/23   Early, Sung Amabile, NP  JUNEL FE 24 1-20 MG-MCG(24) tablet TAKE 1 TABLET BY MOUTH DAILY Patient taking differently: Take 1 tablet by mouth at bedtime. 02/14/23   Hildred Laser, MD  Lancet Device MISC 1 each by Does not apply route 3 (three) times daily. May dispense any manufacturer covered by patient's insurance. 07/12/23   Tollie Eth, NP  Lancets MISC 1 each by Does not apply route 3 (three) times daily. Use as directed to check blood sugar. May dispense any manufacturer covered by patient's insurance and fits patient's device. 06/01/23   Darlin Priestly, MD  montelukast (SINGULAIR) 10 MG tablet Take 10 mg by mouth at bedtime.    [provider]  Multiple Vitamin (MULTI VITAMIN DAILY PO) Take 1 tablet by mouth daily.    [provider]  naloxone [facility]) nasal spray 4 mg/0.1 mL Use at the first sign of opiate overdose. Call 911. Stay with patient. Repeat dose in 3 minutes if symptoms of overdose return. 08/11/23   Tollie Eth, NP  ondansetron (ZOFRAN-ODT) 4 MG disintegrating tablet Take 1 tablet (4 mg total) by mouth every 8 (eight) hours as needed for nausea or vomiting. 08/25/23   Tollie Eth, NP  oxyCODONE (ROXICODONE) 5 MG immediate release  tablet Take 1 tablet (5 mg total) by mouth every 6 (six) hours as needed for severe pain. May take additional 1/2 tab (2.5mg ) up to twice in 24 hours for breakthrough pain. Do not take with any other medications containing oxycodone. 08/11/23   Tollie Eth, NP  pantoprazole (PROTONIX) 40 MG tablet Take 1 tablet (40 mg total) by mouth 2 (two) times daily. Follow up with GI and/or surgery for additional instructions 06/26/23 08/25/23  Zigmund Daniel., MD  potassium chloride (KLOR-CON M) 20 MEQ tablet Take 1 tablet (20 mEq total) by mouth 2 (two) times daily. 07/21/23   Debbe Odea, MD  QVAR REDIHALER 40 MCG/ACT inhaler Inhale 1 puff into the lungs 2 (two) times daily. 12/09/22   [provider]  Sodium Chloride Flush (SALINE FLUSH) 0.9 % SOLN Flush drain daily with 5 ml saline 06/26/23   Zigmund Daniel., MD    Allergies:  Allergies as of 09/08/2023 - Review Complete 09/08/2023  Allergen Reaction Noted   Aspirin Anaphylaxis and Shortness Of Breath 01/12/1990   Ketorolac Anaphylaxis 03/08/2013   Ketorolac tromethamine Shortness Of Breath 11/14/1998    ROS:  General: Denies weight  loss, weight gain, fatigue, fevers, chills, and night sweats. Eyes: Denies blurry vision, double vision, eye pain, itchy eyes, and tearing. Ears: Denies hearing loss, earache, and ringing in ears. Nose: Denies sinus pain, congestion, infections, runny nose, and nosebleeds. Mouth/throat: Denies hoarseness, sore throat, bleeding gums, and difficulty swallowing. Heart: Denies chest pain, palpitations, racing heart, irregular heartbeat, leg pain or swelling, and decreased activity tolerance. Respiratory: Denies wheezing, cough, and sputum. GI: Denies change in appetite, heartburn, constipation, diarrhea, and blood in stool. GU: Denies difficulty urinating, pain with urinating, urgency, frequency, blood in urine. Musculoskeletal: Denies joint stiffness, pain, swelling, muscle weakness. Skin: Denies  rash, itching, mass, tumors, sores, and boils Neurologic: Denies headache, fainting, dizziness, seizures, numbness, and tingling. Psychiatric: Denies depression, anxiety, difficulty sleeping, and memory loss. Endocrine: Denies heat or cold intolerance, and increased thirst or urination. Blood/lymph: Denies easy bruising, easy bruising, and swollen glands     Objective:     BP (!) 91/42   Pulse (!) 130   Temp 98.4 F (36.9 C) (Oral)   Resp (!) 37   Wt 99.5 kg   SpO2 100%   BMI 35.41 kg/m   Constitutional :  alert, cooperative, appears stated age, and mild distress  Lymphatics/Throat:  no asymmetry, masses, or scars  Respiratory:  clear to auscultation bilaterally  Cardiovascular:  Sinus tachy on monitor  Gastrointestinal: Soft, no guarding, no rebound tenderness, focal TTP around LUQ wrapping to flank, extending into lower thoracic area .   Musculoskeletal: Steady movement  Skin: Cool and moist, former percutaneous drain site healed.  No drainage or overlying skin changes   Psychiatric: Normal affect, non-agitated, not confused       LABS:     Latest Ref Rng & Units 09/08/2023   12:35 AM 08/01/2023   12:15 PM 07/20/2023    3:14 PM  CMP  Glucose 70 - 99 mg/dL 098  119  147   BUN 6 - 20 mg/dL 10  8  7    Creatinine 0.44 - 1.00 mg/dL 8.29  5.62  1.30   Sodium 135 - 145 mmol/L 136  139  135   Potassium 3.5 - 5.1 mmol/L 3.9  4.4  4.0   Chloride 98 - 111 mmol/L 104  102  104   CO2 22 - 32 mmol/L 21  18  23    Calcium 8.9 - 10.3 mg/dL 8.2  9.4  8.4   Total Protein 6.5 - 8.1 g/dL 6.2   6.6   Total Bilirubin 0.3 - 1.2 mg/dL 3.7   1.2   Alkaline Phos 38 - 126 U/L 205     AST 15 - 41 U/L 135   17   ALT 0 - 44 U/L 101   18       Latest Ref Rng & Units 09/08/2023   12:35 AM 07/20/2023    3:14 PM 07/05/2023   11:49 AM  CBC  WBC 4.0 - 10.5 K/uL 17.7  7.5  4.5   Hemoglobin 12.0 - 15.0 g/dL 9.3  8.6  9.2   Hematocrit 36.0 - 46.0 % 30.9  28.3  29.3   Platelets 150 - 400 K/uL 460   305  432     RADS: CLINICAL DATA:  Acute chest pain. Elevated D-dimer. Shortness of breath.   EXAM: CT ANGIOGRAPHY CHEST   CT ABDOMEN AND PELVIS WITH CONTRAST   TECHNIQUE: Multidetector CT imaging of the chest was performed using the standard protocol during bolus administration of intravenous contrast. Multiplanar CT image  reconstructions and MIPs were obtained to evaluate the vascular anatomy. Multidetector CT imaging of the abdomen and pelvis was performed using the standard protocol during bolus administration of intravenous contrast.   RADIATION DOSE REDUCTION: This exam was performed according to the departmental dose-optimization program which includes automated exposure control, adjustment of the mA and/or kV according to patient size and/or use of iterative reconstruction technique.   CONTRAST:  OMNIPAQUE IOHEXOL 350 MG/ML SOLN   COMPARISON:  CT abdomen and pelvis 08/03/2023.  CT chest 05/30/2023.   FINDINGS: CTA CHEST FINDINGS   Cardiovascular: Satisfactory opacification of the pulmonary arteries to the segmental level. No evidence of pulmonary embolism. Normal heart size. No pericardial effusion.   Mediastinum/Nodes: Thyroid gland is not visualized. There are no enlarged mediastinal or hilar lymph nodes. Visualized esophagus is within normal limits.   Lungs/Pleura: There are atelectatic changes in the bilateral lower lobes. There is a nodular density in the superior segment of the left lower lobe measuring 5 mm image 3/35 which is new from prior. There is a small left pleural effusion. There is a questionable bubble of air within the left pleural effusion.   Musculoskeletal: No chest wall abnormality. No acute or significant osseous findings.   Review of the MIP images confirms the above findings.   CT ABDOMEN and PELVIS FINDINGS   Hepatobiliary: There is diffuse fatty infiltration of the liver. There is no focal liver lesion. The gallbladder and  bile ducts are within normal limits.   Pancreas: There is inflammatory stranding surrounding the body and tail of the pancreas. There is prominence of the pancreatic duct which is new from prior. In the tail of the pancreas there is a rounded hypodense area measuring 12 mm which is new from prior.   Spleen: Normal in size without focal abnormality.   Adrenals/Urinary Tract: Adrenal glands are unremarkable. Kidneys are normal, without renal calculi, focal lesion, or hydronephrosis. Bladder is unremarkable.   Stomach/Bowel: There are postsurgical changes in the stomach. Questionable portion of catheter within or surrounding the proximal stomach. This is a new finding. Appendix appears normal. No evidence of bowel wall thickening, distention, or inflammatory changes.   Vascular/Lymphatic: Aorta and IVC are normal in size. There is hypodense thrombus within the main portal vein image 4/30. The splenic vein is not opacified on this study and may be thrombosed. Posterior branch of superior mesenteric vein contains a small amount of thrombus image 4/43. Anterior portion of superior mesenteric vein appears patent. No enlarged lymph nodes are seen.   Reproductive: Stable diffuse enlargement of the uterus with lobular contour and multiple hypodense lesions, likely related to fibroids. The ovaries are not well seen.   Other: There is a percutaneous drainage catheter remains in the left upper quadrant abutting the stomach and spleen, although the percutaneous portion has been removed. There is ill-defined air-fluid collection in the left upper quadrant abutting the diaphragm measuring approximally 6.7 x 7.3 x 4.4 cm. There is no ascites. There is no focal abdominal wall hernia.   Musculoskeletal: No acute or significant osseous findings.   Review of the MIP images confirms the above findings.   IMPRESSION: 1. No evidence for pulmonary embolism. 2. Small left pleural effusion with  questionable bubble of air in the effusion. Empyema not excluded. 3. 5 mm nodular density in the superior segment of the left lower lobe is new from prior. 4. New findings compatible with acute pancreatitis. There is a 12 mm hypodense area in the tail of the  pancreas which may represent a pseudocyst. 5. Main portal vein thrombus. Splenic vein is not opacified and may be thrombosed. Posterior branch of superior mesenteric vein contains a small amount of thrombus. 6. Percutaneous drainage catheter remains in the left upper quadrant abutting the stomach and spleen, although the percutaneous portion has been removed. Findings are suspicious for fractured catheter. 7. There is a 6.7 x 7.3 x 4.4 cm air-fluid collection in the left upper quadrant abutting the diaphragm worrisome for abscess. 8. Questionable portion of catheter within or surrounding the proximal stomach. Erosion of catheter fracture into the stomach not excluded. 9. Fatty infiltration of the liver. 10. Stable enlarged fibroid uterus.   These results were called by telephone at the time of interpretation on 09/08/2023 at 2:13 am to provider DAVID [facility] , who verbally acknowledged these results.     Electronically Signed   By: Darliss Cheney M.D.   On: 09/08/2023 02:16   Assessment:   Intra-abdominal abscess, pancreatitis, left lung empyema  Plan:   I personally reviewed the CT images and compared them to the latest imaging from [facility] through the [facility] E[facility].  There definitely seems to be increased pneumoperitoneum and size of this chronic abscess along with lung base involvement as noted in the report above.  Stent still seems to be in proper location.  Fluid resuscitation IV antibiotics started by ED provider at this point.  She still is tachypneic with persistent tachycardia, but fortunately abdominal exam does not indicate any sign of diffuse peritonitis.  With her chronicity of her issue as well as the very complicated  history which may require surgical intervention, including possible thoracic involvement, case is beyond the scope of practice here at [facility].    Recommended urgent transfer back to [facility] for further monitoring and care.  ED provider notified after extensive review of the chart and assessment at bedside.  Patient and daughter at bedside also verbalized understanding and is agreeable to the plan.  labs/images/medications/previous chart entries reviewed personally and relevant changes/updates noted above.

## 2023-09-08 NOTE — ED Notes (Signed)
Burna Mortimer called from transfer center, and said the patient is accepted at [facility] to bed CC05.   [address] [place], [place], [place] [address].  Report is to be called to [phone]  Transport service number for [facility] is [phone]  Accepting physician is Dr. Vivia Ewing

## 2023-09-08 NOTE — Progress Notes (Signed)
PHARMACY -  BRIEF ANTIBIOTIC NOTE   Pharmacy has received consult(s) for Vanc, Cefepime from an ED provider.  The patient's profile has been reviewed for ht/wt/allergies/indication/available labs.    One time order(s) placed for Vancomycin 2 gm IV X 1 and Cefepime 2 gm IV X 1   Further antibiotics/pharmacy consults should be ordered by admitting physician if indicated.                       Thank you, Taino Maertens D 09/08/2023  3:08 AM

## 2023-09-08 NOTE — Progress Notes (Signed)
Notified of bed availability and transport arrangements for [facility].  Pt clinical exam remains unchanged from previous visit.  With imminent transport, recommend pt to proceed with further care at [facility], where more definitive care and appropriate ancillary support for her case.  Pt and daughter agreeable and all last minute questions and concerns addressed

## 2023-09-08 NOTE — Sepsis Progress Note (Signed)
Notified provider of need to order 3rd lactic acid.

## 2023-09-08 NOTE — Progress Notes (Signed)
CODE SEPSIS - PHARMACY COMMUNICATION  **Broad Spectrum Antibiotics should be administered within 1 hour of Sepsis diagnosis**  Time Code Sepsis Called/Page Received: 10/25 @ 0217  Antibiotics Ordered: metronidazole, cefepime , vancomycin   Time of 1st antibiotic administration: metronidazole 500 mg IV X 1 on 10/25 @ 0252.   Additional action taken by pharmacy:   If necessary, Name of Provider/Nurse Contacted:     Kerryn Tennant D ,PharmD Clinical Pharmacist  09/08/2023  3:09 AM

## 2023-09-08 NOTE — ED Notes (Signed)
Dr Sakai at bedside.  ?

## 2023-09-08 NOTE — ED Triage Notes (Signed)
Patient arrives from home by [facility] for allergic reaction to an unknown substance.  Patient recently had abscess drained on abdomen.  She self administered 2 Epi-pen autoinjections, and she has used her albuterol inhaler several times.  Patient says she is having a hard time breathing. She says she felt like she was having a hard time taking a breath which came on suddenly.

## 2023-09-08 NOTE — ED Notes (Signed)
David from transfer center at [facility] has accepted for transport, and ETA is approximately 0830.  They will call for report when in route.

## 2023-09-08 NOTE — ED Notes (Signed)
Call made to [facility] to request consult with transfer to [facility] spoke to Rep [facility] @0500  waiting on callback

## 2023-09-13 LAB — CULTURE, BLOOD (SINGLE): Culture: NO GROWTH

## 2023-09-22 ENCOUNTER — Ambulatory Visit: Payer: 59 | Admitting: Cardiology

## 2023-09-28 ENCOUNTER — Encounter: Payer: 59 | Admitting: Family

## 2023-10-02 ENCOUNTER — Encounter: Payer: Self-pay | Admitting: Internal Medicine

## 2023-10-03 ENCOUNTER — Encounter: Payer: Self-pay | Admitting: Nurse Practitioner

## 2023-10-09 ENCOUNTER — Encounter: Payer: 59 | Admitting: Nurse Practitioner

## 2023-10-16 ENCOUNTER — Encounter: Payer: Self-pay | Admitting: Nurse Practitioner

## 2023-10-24 ENCOUNTER — Encounter: Payer: 59 | Admitting: Nurse Practitioner

## 2023-10-24 DIAGNOSIS — K219 Gastro-esophageal reflux disease without esophagitis: Secondary | ICD-10-CM

## 2023-10-24 DIAGNOSIS — D500 Iron deficiency anemia secondary to blood loss (chronic): Secondary | ICD-10-CM

## 2023-10-24 DIAGNOSIS — I5030 Unspecified diastolic (congestive) heart failure: Secondary | ICD-10-CM

## 2023-10-24 DIAGNOSIS — E118 Type 2 diabetes mellitus with unspecified complications: Secondary | ICD-10-CM

## 2023-10-27 ENCOUNTER — Telehealth: Payer: Self-pay | Admitting: Nurse Practitioner

## 2023-10-27 NOTE — Telephone Encounter (Signed)
[employer] disability forms received and sent back in folder, please return to [place] to  [place] records and form completion fee

## 2023-10-30 NOTE — Telephone Encounter (Signed)
Received notification that patient has passed away at [facility]  11-11-2023

## 2023-11-15 DEATH — deceased
